# Patient Record
Sex: Female | Born: 1950 | Race: White | Hispanic: No | Marital: Single | Smoking: Current every day smoker
Health system: Southern US, Community
[De-identification: ages and names within clinical notes are randomized; demographics above are authoritative.]

## PROBLEM LIST (undated history)

## (undated) DIAGNOSIS — F32A Depression, unspecified: Secondary | ICD-10-CM

## (undated) DIAGNOSIS — R202 Paresthesia of skin: Secondary | ICD-10-CM

## (undated) DIAGNOSIS — I499 Cardiac arrhythmia, unspecified: Secondary | ICD-10-CM

## (undated) DIAGNOSIS — E785 Hyperlipidemia, unspecified: Secondary | ICD-10-CM

## (undated) DIAGNOSIS — M1990 Unspecified osteoarthritis, unspecified site: Secondary | ICD-10-CM

## (undated) DIAGNOSIS — I4891 Unspecified atrial fibrillation: Secondary | ICD-10-CM

## (undated) DIAGNOSIS — J189 Pneumonia, unspecified organism: Secondary | ICD-10-CM

## (undated) DIAGNOSIS — F329 Major depressive disorder, single episode, unspecified: Secondary | ICD-10-CM

## (undated) DIAGNOSIS — I2699 Other pulmonary embolism without acute cor pulmonale: Secondary | ICD-10-CM

## (undated) DIAGNOSIS — I10 Essential (primary) hypertension: Secondary | ICD-10-CM

## (undated) DIAGNOSIS — R0600 Dyspnea, unspecified: Secondary | ICD-10-CM

## (undated) DIAGNOSIS — R200 Anesthesia of skin: Secondary | ICD-10-CM

## (undated) DIAGNOSIS — J449 Chronic obstructive pulmonary disease, unspecified: Secondary | ICD-10-CM

## (undated) DIAGNOSIS — T884XXA Failed or difficult intubation, initial encounter: Secondary | ICD-10-CM

## (undated) DIAGNOSIS — C801 Malignant (primary) neoplasm, unspecified: Secondary | ICD-10-CM

## (undated) DIAGNOSIS — K219 Gastro-esophageal reflux disease without esophagitis: Secondary | ICD-10-CM

## (undated) HISTORY — DX: Depression, unspecified: F32.A

## (undated) HISTORY — DX: Anesthesia of skin: R20.0

## (undated) HISTORY — DX: Other pulmonary embolism without acute cor pulmonale: I26.99

## (undated) HISTORY — DX: Anesthesia of skin: R20.2

## (undated) HISTORY — PX: CYST EXCISION: SHX5701

## (undated) HISTORY — DX: Hyperlipidemia, unspecified: E78.5

## (undated) HISTORY — DX: Major depressive disorder, single episode, unspecified: F32.9

## (undated) HISTORY — PX: CERVIX LESION DESTRUCTION: SHX591

---

## 1971-03-08 DIAGNOSIS — I2699 Other pulmonary embolism without acute cor pulmonale: Secondary | ICD-10-CM

## 1971-03-08 HISTORY — DX: Other pulmonary embolism without acute cor pulmonale: I26.99

## 2000-03-07 HISTORY — PX: MASS EXCISION: SHX2000

## 2000-09-15 ENCOUNTER — Ambulatory Visit (HOSPITAL_BASED_OUTPATIENT_CLINIC_OR_DEPARTMENT_OTHER): Admission: RE | Admit: 2000-09-15 | Discharge: 2000-09-15 | Payer: Self-pay | Admitting: Otolaryngology

## 2014-09-15 ENCOUNTER — Other Ambulatory Visit (HOSPITAL_COMMUNITY): Payer: Self-pay | Admitting: Family Medicine

## 2014-09-15 DIAGNOSIS — Z1231 Encounter for screening mammogram for malignant neoplasm of breast: Secondary | ICD-10-CM

## 2014-10-06 ENCOUNTER — Ambulatory Visit (HOSPITAL_COMMUNITY): Payer: Self-pay

## 2015-01-16 NOTE — H&P (Signed)
  NTS SOAP Note  Vital Signs:  Vitals as of: XX123456: Systolic 123XX123: Diastolic 123456: Heart Rate 80: Temp 65F: Height 30ft 6in: Weight 186Lbs 0 Ounces: Pain Level 2: BMI 30.02  BMI : 30.02 kg/m2  Subjective: This 64 year old female presents for of a recurrent cyst in the left arm.  Was removed in the past in another office, has since recurred.  Review of Symptoms:  Constitutional:unremarkable   headache Eyes:unremarkable   Nose/Mouth/Throat:unremarkable Cardiovascular:  unremarkable Respiratory:wheezing, cough Gastrointestinheartburn Genitourinary:unremarkable   joint, neck, and back pain Hematolgic/Lymphatic:unremarkable   Allergic/Immunologic:unremarkable   Past Medical History:  Reviewed  Past Medical History  Surgical History:  cyst excision left arm in past Medical Problems: HTN, high cholesterol Allergies: nkda Medications: lisinopril, lovastatin, effexor   Social History:Reviewed  Social History  Preferred Language: English Race:  White Ethnicity: Not Hispanic / Latino Age: 64 year Marital Status:  M Alcohol: unknown   Smoking Status: Light tobacco smoker reviewed on 01/15/2015 Started Date:  Packs per week:  Functional Status reviewed on 01/15/2015 ------------------------------------------------ Bathing: Normal Cooking: Normal Dressing: Normal Driving: Normal Eating: Normal Managing Meds: Normal Oral Care: Normal Shopping: Normal Toileting: Normal Transferring: Normal Walking: Normal Cognitive Status reviewed on 01/15/2015 ------------------------------------------------ Attention: Normal Decision Making: Normal Language: Normal Memory: Normal Motor: Normal Perception: Normal Problem Solving: Normal Visual and Spatial: Normal   Family History:Reviewed  Family Health History Family History is Unknown    Objective Information: General:Well appearing, well nourished in no distress. 3cm tender, erythematous cyst in  the left arm. Heart:RRR, no murmur Lungs:  CTA bilaterally, no wheezes, rhonchi, rales.  Breathing unlabored.  Assessment:inflamed sebaceous cyst, left arm  Diagnoses: 706.2  L72.3 Epidermoid cyst of skin (Sebaceous cyst)  Procedures: [id number] - OFFICE OUTPATIENT NEW 20 MINUTES    Plan:  Scheduled for excision of cyst, left arm on 01/21/15.   Patient Education:Alternative treatments to surgery were discussed with patient (and family).  Risks and benefits  of procedure were fully explained to the patient (and family) who gave informed consent. Patient/family questions were addressed.  Follow-up:Pending Surgery

## 2015-01-16 NOTE — Patient Instructions (Signed)
Laura Mcgee  01/16/2015     @PREFPERIOPPHARMACY @   Your procedure is scheduled on  01/21/2015  Report to [facility] at  730  A.M.  Call this number if you have problems the morning of surgery:  [phone]   Remember:  Do not eat food or drink liquids after midnight.  Take these medicines the morning of surgery with A SIP OF WATER  Dexilant, hydrocodone, lisinopril, zantac, effexor.   Do not wear jewelry, make-up or nail polish.  Do not wear lotions, powders, or perfumes.  You may wear deodorant.  Do not shave 48 hours prior to surgery.  Men may shave face and neck.  Do not bring valuables to the hospital.  [facility] is not responsible for any belongings or valuables.  Contacts, dentures or bridgework may not be worn into surgery.  Leave your suitcase in the car.  After surgery it may be brought to your room.  For patients admitted to the hospital, discharge time will be determined by your treatment team.  Patients discharged the day of surgery will not be allowed to drive home.   Name and phone number of your driver:   family Special instructions:  none  Please read over the following fact sheets that you were given. Pain Booklet, Coughing and Deep Breathing, Surgical Site Infection Prevention, Anesthesia Post-op Instructions and Care and Recovery After Surgery      Sebaceous Cyst Removal Sebaceous cyst removal is a procedure to remove a sac of oily material that forms under your skin (sebaceous cyst). Sebaceous cysts may also be called epidermoid cysts or keratin cysts. Normally, the skin secretes this oily material through a gland or a hair follicle. This type of cyst usually results when a skin gland or hair follicle becomes blocked. You may need this procedure if you have a sebaceous cyst that becomes large, uncomfortable, or infected. LET [facility] CARE PROVIDER KNOW ABOUT:  Any allergies you have.  All medicines you are taking, including vitamins,  herbs, eye drops, creams, and over-the-counter medicines.  Previous problems you or members of your family have had with the use of anesthetics.  Any blood disorders you have.  Previous surgeries you have had.  Medical conditions you have. RISKS AND COMPLICATIONS Generally, this is a safe procedure. However, problems may occur, including:  Developing another cyst.  Bleeding.  Infection.  Scarring. BEFORE THE PROCEDURE  Ask your health care provider about:  Changing or stopping your regular medicines. This is especially important if you are taking diabetes medicines or blood thinners.  Taking medicines such as aspirin and ibuprofen. These medicines can thin your blood. Do not take these medicines before your procedure if your health care provider instructs you not to.  If you have an infected cyst, you may have to take antibiotic medicines before or after the cyst removal. Take your antibiotics as directed by your health care provider. Finish all of the medicine even if you start to feel better.  Take a shower on the morning of your procedure. Your health care provider may ask you to use a germ-killing (antiseptic) soap. PROCEDURE  You will be given a medicine that numbs the area (local anesthetic).  The skin around the cyst will be cleaned with a germ-killing solution (antiseptic).  Your health care provider will make a small surgical incision over the cyst.  The cyst will be separated from the surrounding tissues that are under your skin.  If possible, the  cyst will be removed undamaged (intact).  If the cyst bursts (ruptures), it will need to be removed in pieces.  After the cyst is removed, your health care provider will control any bleeding and close the incision with small stitches (sutures). Small incisions may not need sutures, and the bleeding will be controlled by applying direct pressure with gauze.  Your health care provider may apply antibiotic ointment and a  light bandage (dressing) over the incision. This procedure may vary among health care providers and hospitals. AFTER THE PROCEDURE  If your cyst ruptured during surgery, you may need to take antibiotic medicine. If you were prescribed an antibiotic medicine, finish all of it even if you start to feel better.   This information is not intended to replace advice given to you by your health care provider. Make sure you discuss any questions you have with your health care provider.   Document Released: 02/19/2000 Document Revised: 03/14/2014 Document Reviewed: 11/06/2013 Elsevier Interactive Patient Education 2016 Elsevier Inc[employer] PATIENT INSTRUCTIONS POST-ANESTHESIA  IMMEDIATELY FOLLOWING SURGERY:  Do not drive or operate machinery for the first twenty four hours after surgery.  Do not make any important decisions for twenty four hours after surgery or while taking narcotic pain medications or sedatives.  If you develop intractable nausea and vomiting or a severe headache please notify your doctor immediately.  FOLLOW-UP:  Please make an appointment with your surgeon as instructed. You do not need to follow up with anesthesia unless specifically instructed to do so.  WOUND CARE INSTRUCTIONS (if applicable):  Keep a dry clean dressing on the anesthesia/puncture wound site if there is drainage.  Once the wound has quit draining you may leave it open to air.  Generally you should leave the bandage intact for twenty four hours unless there is drainage.  If the epidural site drains for more than 36-48 hours please call the anesthesia department.  QUESTIONS?:  Please feel free to call your physician or the hospital operator if you have any questions, and they will be happy to assist you.

## 2015-01-19 ENCOUNTER — Encounter (HOSPITAL_COMMUNITY)
Admission: RE | Admit: 2015-01-19 | Discharge: 2015-01-19 | Disposition: A | Discharge status: Home / Self Care | Payer: Medicare Other | Source: Ambulatory Visit | Attending: General Surgery | Admitting: General Surgery

## 2015-01-19 ENCOUNTER — Encounter (HOSPITAL_COMMUNITY): Payer: Self-pay

## 2015-01-19 ENCOUNTER — Other Ambulatory Visit: Payer: Self-pay

## 2015-01-19 DIAGNOSIS — F17210 Nicotine dependence, cigarettes, uncomplicated: Secondary | ICD-10-CM | POA: Diagnosis not present

## 2015-01-19 DIAGNOSIS — L723 Sebaceous cyst: Secondary | ICD-10-CM | POA: Diagnosis present

## 2015-01-19 DIAGNOSIS — L720 Epidermal cyst: Secondary | ICD-10-CM | POA: Diagnosis not present

## 2015-01-19 DIAGNOSIS — K219 Gastro-esophageal reflux disease without esophagitis: Secondary | ICD-10-CM | POA: Diagnosis not present

## 2015-01-19 DIAGNOSIS — I10 Essential (primary) hypertension: Secondary | ICD-10-CM | POA: Diagnosis not present

## 2015-01-19 DIAGNOSIS — E7800 Pure hypercholesterolemia, unspecified: Secondary | ICD-10-CM | POA: Diagnosis not present

## 2015-01-19 HISTORY — DX: Gastro-esophageal reflux disease without esophagitis: K21.9

## 2015-01-19 HISTORY — DX: Failed or difficult intubation, initial encounter: T88.4XXA

## 2015-01-19 HISTORY — DX: Essential (primary) hypertension: I10

## 2015-01-19 LAB — BASIC METABOLIC PANEL
Anion gap: 14 (ref 5–15)
BUN: 22 mg/dL — AB (ref 6–20)
CHLORIDE: 98 mmol/L — AB (ref 101–111)
CO2: 21 mmol/L — AB (ref 22–32)
CREATININE: 0.9 mg/dL (ref 0.44–1.00)
Calcium: 9.5 mg/dL (ref 8.9–10.3)
GFR calc non Af Amer: >60 mL/min (ref >60)
Glucose, Bld: 83 mg/dL (ref 65–99)
POTASSIUM: 4 mmol/L (ref 3.5–5.1)
Sodium: 133 mmol/L — ABNORMAL LOW (ref 135–145)

## 2015-01-19 LAB — CBC WITH DIFFERENTIAL/PLATELET
Basophils Absolute: 0.1 10*3/uL (ref 0.0–0.1)
Basophils Relative: 1 %
EOS ABS: 0.1 10*3/uL (ref 0.0–0.7)
Eosinophils Relative: 2 %
HEMATOCRIT: 40.8 % (ref 36.0–46.0)
HEMOGLOBIN: 14.3 g/dL (ref 12.0–15.0)
LYMPHS ABS: 2.6 10*3/uL (ref 0.7–4.0)
LYMPHS PCT: 31 %
MCH: 33.3 pg (ref 26.0–34.0)
MCHC: 35 g/dL (ref 30.0–36.0)
MCV: 94.9 fL (ref 78.0–100.0)
MONOS PCT: 12 %
Monocytes Absolute: 1 10*3/uL (ref 0.1–1.0)
NEUTROS PCT: 54 %
Neutro Abs: 4.6 10*3/uL (ref 1.7–7.7)
Platelets: 262 10*3/uL (ref 150–400)
RBC: 4.3 MIL/uL (ref 3.87–5.11)
RDW: 14.4 % (ref 11.5–15.5)
WBC: 8.4 10*3/uL (ref 4.0–10.5)

## 2015-01-21 ENCOUNTER — Ambulatory Visit (HOSPITAL_COMMUNITY)
Admission: RE | Admit: 2015-01-21 | Discharge: 2015-01-21 | Disposition: A | Discharge status: Home / Self Care | Payer: Medicare Other | Source: Ambulatory Visit | Attending: General Surgery | Admitting: General Surgery

## 2015-01-21 ENCOUNTER — Encounter (HOSPITAL_COMMUNITY): Payer: Self-pay | Admitting: *Deleted

## 2015-01-21 ENCOUNTER — Ambulatory Visit (HOSPITAL_COMMUNITY): Payer: Medicare Other | Admitting: Anesthesiology

## 2015-01-21 ENCOUNTER — Encounter (HOSPITAL_COMMUNITY)
Admission: RE | Disposition: A | Discharge status: Home / Self Care | Payer: Self-pay | Source: Ambulatory Visit | Attending: General Surgery

## 2015-01-21 DIAGNOSIS — L720 Epidermal cyst: Secondary | ICD-10-CM | POA: Diagnosis not present

## 2015-01-21 DIAGNOSIS — F17210 Nicotine dependence, cigarettes, uncomplicated: Secondary | ICD-10-CM | POA: Diagnosis not present

## 2015-01-21 DIAGNOSIS — E7800 Pure hypercholesterolemia, unspecified: Secondary | ICD-10-CM | POA: Insufficient documentation

## 2015-01-21 DIAGNOSIS — I10 Essential (primary) hypertension: Secondary | ICD-10-CM | POA: Insufficient documentation

## 2015-01-21 DIAGNOSIS — K219 Gastro-esophageal reflux disease without esophagitis: Secondary | ICD-10-CM | POA: Diagnosis not present

## 2015-01-21 HISTORY — PX: MASS EXCISION: SHX2000

## 2015-01-21 SURGERY — EXCISION MASS
Anesthesia: General | Site: Arm Upper | Laterality: Left

## 2015-01-21 MED ORDER — BUPIVACAINE HCL (PF) 0.5 % IJ SOLN
INTRAMUSCULAR | Status: AC
  Filled 2015-01-21: qty 30

## 2015-01-21 MED ORDER — GLYCOPYRROLATE 0.2 MG/ML IJ SOLN
0.2000 mg | Freq: Once | INTRAMUSCULAR | Status: AC
  Administered 2015-01-21: 0.2 mg via INTRAVENOUS
  Filled 2015-01-21: qty 1

## 2015-01-21 MED ORDER — LIDOCAINE HCL (PF) 1 % IJ SOLN
INTRAMUSCULAR | Status: AC
  Filled 2015-01-21: qty 5

## 2015-01-21 MED ORDER — EPHEDRINE SULFATE 50 MG/ML IJ SOLN
INTRAMUSCULAR | Status: AC
  Filled 2015-01-21: qty 1

## 2015-01-21 MED ORDER — MIDAZOLAM HCL 2 MG/2ML IJ SOLN
1.0000 mg | INTRAMUSCULAR | Status: DC | PRN
  Administered 2015-01-21 (×2): 2 mg via INTRAVENOUS
  Filled 2015-01-21 (×2): qty 2

## 2015-01-21 MED ORDER — KETOROLAC TROMETHAMINE 30 MG/ML IJ SOLN
30.0000 mg | Freq: Once | INTRAMUSCULAR | Status: AC
  Administered 2015-01-21: 30 mg via INTRAVENOUS

## 2015-01-21 MED ORDER — FENTANYL CITRATE (PF) 100 MCG/2ML IJ SOLN: 25.0000 ug | INTRAMUSCULAR | Status: DC | PRN

## 2015-01-21 MED ORDER — CHLORHEXIDINE GLUCONATE 4 % EX LIQD: 1.0000 "application " | Freq: Once | CUTANEOUS | Status: DC

## 2015-01-21 MED ORDER — LACTATED RINGERS IV SOLN
INTRAVENOUS | Status: DC
  Administered 2015-01-21 (×2): via INTRAVENOUS

## 2015-01-21 MED ORDER — SODIUM CHLORIDE 0.9 % IJ SOLN
INTRAMUSCULAR | Status: AC
  Filled 2015-01-21: qty 10

## 2015-01-21 MED ORDER — SUCCINYLCHOLINE CHLORIDE 20 MG/ML IJ SOLN
INTRAMUSCULAR | Status: DC | PRN
  Administered 2015-01-21: 140 mg via INTRAVENOUS

## 2015-01-21 MED ORDER — LIDOCAINE HCL 1 % IJ SOLN
INTRAMUSCULAR | Status: DC | PRN
  Administered 2015-01-21: 25 mg via INTRADERMAL

## 2015-01-21 MED ORDER — PROPOFOL 10 MG/ML IV BOLUS
INTRAVENOUS | Status: DC | PRN
  Administered 2015-01-21: 140 mg via INTRAVENOUS

## 2015-01-21 MED ORDER — GLYCOPYRROLATE 0.2 MG/ML IJ SOLN
INTRAMUSCULAR | Status: DC | PRN
  Administered 2015-01-21: 0.4 mg via INTRAVENOUS

## 2015-01-21 MED ORDER — ROCURONIUM BROMIDE 50 MG/5ML IV SOLN
INTRAVENOUS | Status: AC
  Filled 2015-01-21: qty 1

## 2015-01-21 MED ORDER — HYDROCODONE-ACETAMINOPHEN 10-650 MG PO TABS: 1.0000 | ORAL_TABLET | Freq: Four times a day (QID) | ORAL | Status: DC | PRN

## 2015-01-21 MED ORDER — ROCURONIUM BROMIDE 100 MG/10ML IV SOLN
INTRAVENOUS | Status: DC | PRN
  Administered 2015-01-21: 5 mg via INTRAVENOUS
  Administered 2015-01-21: 20 mg via INTRAVENOUS

## 2015-01-21 MED ORDER — CEFAZOLIN SODIUM-DEXTROSE 2-3 GM-% IV SOLR
2.0000 g | INTRAVENOUS | Status: AC
  Administered 2015-01-21: 2 g via INTRAVENOUS
  Filled 2015-01-21: qty 50

## 2015-01-21 MED ORDER — ONDANSETRON HCL 4 MG/2ML IJ SOLN: 4.0000 mg | Freq: Once | INTRAMUSCULAR | Status: DC | PRN

## 2015-01-21 MED ORDER — NEOSTIGMINE METHYLSULFATE 10 MG/10ML IV SOLN
INTRAVENOUS | Status: DC | PRN
  Administered 2015-01-21: 1 mg via INTRAVENOUS

## 2015-01-21 MED ORDER — MIDAZOLAM HCL 2 MG/2ML IJ SOLN
INTRAMUSCULAR | Status: AC
  Filled 2015-01-21: qty 2

## 2015-01-21 MED ORDER — DEXAMETHASONE SODIUM PHOSPHATE 4 MG/ML IJ SOLN
4.0000 mg | Freq: Once | INTRAMUSCULAR | Status: AC
  Administered 2015-01-21: 4 mg via INTRAVENOUS
  Filled 2015-01-21: qty 1

## 2015-01-21 MED ORDER — ONDANSETRON HCL 4 MG/2ML IJ SOLN
4.0000 mg | Freq: Once | INTRAMUSCULAR | Status: AC
  Administered 2015-01-21: 4 mg via INTRAVENOUS
  Filled 2015-01-21: qty 2

## 2015-01-21 MED ORDER — FENTANYL CITRATE (PF) 100 MCG/2ML IJ SOLN
INTRAMUSCULAR | Status: DC | PRN
  Administered 2015-01-21 (×2): 50 ug via INTRAVENOUS

## 2015-01-21 MED ORDER — 0.9 % SODIUM CHLORIDE (POUR BTL) OPTIME
TOPICAL | Status: DC | PRN
  Administered 2015-01-21: 1000 mL

## 2015-01-21 MED ORDER — MIDAZOLAM HCL 5 MG/5ML IJ SOLN
INTRAMUSCULAR | Status: DC | PRN
  Administered 2015-01-21: 2 mg via INTRAVENOUS

## 2015-01-21 MED ORDER — SUCCINYLCHOLINE CHLORIDE 20 MG/ML IJ SOLN
INTRAMUSCULAR | Status: AC
  Filled 2015-01-21: qty 1

## 2015-01-21 MED ORDER — GLYCOPYRROLATE 0.2 MG/ML IJ SOLN
INTRAMUSCULAR | Status: AC
  Filled 2015-01-21: qty 2

## 2015-01-21 MED ORDER — PROPOFOL 10 MG/ML IV BOLUS
INTRAVENOUS | Status: AC
  Filled 2015-01-21: qty 20

## 2015-01-21 MED ORDER — KETOROLAC TROMETHAMINE 30 MG/ML IJ SOLN
INTRAMUSCULAR | Status: AC
  Filled 2015-01-21: qty 1

## 2015-01-21 MED ORDER — FENTANYL CITRATE (PF) 100 MCG/2ML IJ SOLN
INTRAMUSCULAR | Status: AC
  Filled 2015-01-21: qty 2

## 2015-01-21 MED ORDER — BUPIVACAINE HCL (PF) 0.5 % IJ SOLN
INTRAMUSCULAR | Status: DC | PRN
  Administered 2015-01-21: 4 mL

## 2015-01-21 SURGICAL SUPPLY — 36 items
ADH SKN CLS APL DERMABOND .7 (GAUZE/BANDAGES/DRESSINGS) ×1
BAG HAMPER (MISCELLANEOUS) ×3 IMPLANT
CHLORAPREP W/TINT 10.5 ML (MISCELLANEOUS) ×3 IMPLANT
CLOTH BEACON ORANGE TIMEOUT ST (SAFETY) ×3 IMPLANT
COVER LIGHT HANDLE STERIS (MISCELLANEOUS) ×6 IMPLANT
DECANTER SPIKE VIAL GLASS SM (MISCELLANEOUS) ×3 IMPLANT
DERMABOND ADVANCED (GAUZE/BANDAGES/DRESSINGS) ×2
DERMABOND ADVANCED .7 DNX12 (GAUZE/BANDAGES/DRESSINGS) IMPLANT
ELECT NDL TIP 2.8 STRL (NEEDLE) IMPLANT
ELECT NEEDLE TIP 2.8 STRL (NEEDLE) IMPLANT
ELECT REM PT RETURN 9FT ADLT (ELECTROSURGICAL) ×3
ELECTRODE REM PT RTRN 9FT ADLT (ELECTROSURGICAL) ×1 IMPLANT
FORMALIN 10 PREFIL 120ML (MISCELLANEOUS) ×3 IMPLANT
GLOVE BIOGEL PI IND STRL 7.0 (GLOVE) IMPLANT
GLOVE BIOGEL PI INDICATOR 7.0 (GLOVE) ×2
GLOVE ECLIPSE 6.5 STRL STRAW (GLOVE) ×2 IMPLANT
GLOVE EXAM NITRILE MD LF STRL (GLOVE) ×2 IMPLANT
GLOVE SURG SS PI 7.5 STRL IVOR (GLOVE) ×3 IMPLANT
GOWN STRL REUS W/ TWL XL LVL3 (GOWN DISPOSABLE) ×1 IMPLANT
GOWN STRL REUS W/TWL LRG LVL3 (GOWN DISPOSABLE) ×3 IMPLANT
GOWN STRL REUS W/TWL XL LVL3 (GOWN DISPOSABLE) ×3
KIT ROOM TURNOVER APOR (KITS) ×3 IMPLANT
LIQUID BAND (GAUZE/BANDAGES/DRESSINGS) IMPLANT
MANIFOLD NEPTUNE II (INSTRUMENTS) ×3 IMPLANT
NDL HYPO 25X1 1.5 SAFETY (NEEDLE) ×1 IMPLANT
NEEDLE HYPO 25X1 1.5 SAFETY (NEEDLE) ×3 IMPLANT
NS IRRIG 1000ML POUR BTL (IV SOLUTION) ×3 IMPLANT
PACK MINOR (CUSTOM PROCEDURE TRAY) IMPLANT
PAD ARMBOARD 7.5X6 YLW CONV (MISCELLANEOUS) ×3 IMPLANT
SET BASIN LINEN APH (SET/KITS/TRAYS/PACK) ×3 IMPLANT
SUT ETHILON 3 0 FSL (SUTURE) IMPLANT
SUT PROLENE 4 0 PS 2 18 (SUTURE) IMPLANT
SUT VIC AB 3-0 SH 27 (SUTURE) ×3
SUT VIC AB 3-0 SH 27X BRD (SUTURE) IMPLANT
SUT VIC AB 4-0 PS2 27 (SUTURE) IMPLANT
SYR CONTROL 10ML LL (SYRINGE) ×3 IMPLANT

## 2015-01-21 NOTE — Anesthesia Preprocedure Evaluation (Addendum)
Anesthesia Evaluation  Patient identified by MRN, date of birth, ID band Patient awake    Reviewed: Allergy & Precautions, NPO status , Patient's Chart, lab work & pertinent test results  History of Anesthesia Complications (+) DIFFICULT AIRWAY and history of anesthetic complications (larygospasms often from GERD)  Airway Mallampati: II  TM Distance: >3 FB     Dental  (+) Edentulous Upper, Partial Lower   Pulmonary Current Smoker,    breath sounds clear to auscultation       Cardiovascular hypertension, Pt. 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.)  Poorly Controlled,  Endo/Other    Renal/GU      Musculoskeletal   Abdominal   Peds  Hematology   Anesthesia Other Findings   Reproductive/Obstetrics                            Anesthesia Physical Anesthesia Plan  ASA: II  Anesthesia Plan: General   Post-op Pain Management:    Induction: Intravenous, Rapid sequence and Cricoid pressure planned  Airway Management Planned: Oral ETT  Additional Equipment:   Intra-op Plan:   Post-operative Plan: Extubation in OR  Informed Consent: I have reviewed the patients History and Physical, chart, labs and discussed the procedure including the risks, benefits and alternatives for the proposed anesthesia with the patient or authorized representative who has indicated his/her understanding and acceptance.     Plan Discussed with:   Anesthesia Plan Comments:         Anesthesia Quick Evaluation

## 2015-01-21 NOTE — Op Note (Signed)
Patient:  Laura Mcgee  DOB:  June 09, 1950  MRN:  [id number]   Preop Diagnosis:  Recurrent sebaceous cyst, left arm  Postop Diagnosis:  Same  Procedure:  Excision of 3 cm sebaceous cyst, left arm  Surgeon:  Aviva Signs, M.D.  Anes:  Gen. endotracheal  Indications:  Patient is a 64 year old white female who has had a sebaceous cyst in her left arm removed at another facility in the past who now presents with recurrence of the cyst. It is enlarging in size and tender to touch. The risks and benefits of the procedure including bleeding, infection, and recurrence of the cyst were fully explained to the patient, who gave informed consent.  Procedure note:  The patient was placed in the supine position. After induction of general endotracheal anesthesia, the left arm was prepped and draped using the usual sterile technique with DuraPrep. Surgical site confirmation was performed.  The 3 cm sebaceous cyst was excised in total without difficulty. The wound was irrigated normal saline. 0.5% Sensorcaine was instilled the surrounding wound. The skin was reapproximated using a 4-0 Vicryl subcuticular suture. Dermabond was applied.  All tape and needle counts were correct at the end of the procedure. The patient was extubated in the operating room and transferred to PACU in stable condition.  Complications:  None  EBL:  Minimal  Specimen:  Sebaceous cyst, left arm

## 2015-01-21 NOTE — Interval H&P Note (Signed)
History and Physical Interval Note:  01/21/2015 8:45 AM  Laura Mcgee  has presented today for surgery, with the diagnosis of sebaceous cyst  The various methods of treatment have been discussed with the patient and family. After consideration of risks, benefits and other options for treatment, the patient has consented to  Procedure(s): EXCISION CYST 3CM LEFT UPPER ARM (Left) as a surgical intervention .  The patient's history has been reviewed, patient examined, no change in status, stable for surgery.  I have reviewed the patient's chart and labs.  Questions were answered to the patient's satisfaction.     Aviva Signs A

## 2015-01-21 NOTE — Anesthesia Procedure Notes (Signed)
Procedure Name: Intubation Date/Time: 01/21/2015 9:36 AM Performed by: Charmaine Downs Pre-anesthesia Checklist: Patient identified, Emergency Drugs available, Suction available and Patient being monitored Patient Re-evaluated:Patient Re-evaluated prior to inductionOxygen Delivery Method: Circle system utilized Preoxygenation: Pre-oxygenation with 100% oxygen Intubation Type: IV induction, Rapid sequence and Cricoid Pressure applied Laryngoscope Size: Mac and 3 Grade View: Grade I Tube type: Oral Tube size: 7.0 mm Number of attempts: 1 Airway Equipment and Method: Stylet and Oral airway Placement Confirmation: ETT inserted through vocal cords under direct vision,  breath sounds checked- equal and bilateral and positive ETCO2 Secured at: 22 cm Tube secured with: Tape Dental Injury: Teeth and Oropharynx as per pre-operative assessment  Difficulty Due To: Difficulty was anticipated Future Recommendations: Recommend- induction with short-acting agent, and alternative techniques readily available Comments: Difficulty anticipated due to past history of "laryngospasm" per patient.

## 2015-01-21 NOTE — Transfer of Care (Signed)
Immediate Anesthesia Transfer of Care Note  Patient: Laura Mcgee  Procedure(s) Performed: Procedure(s): EXCISION CYST LEFT UPPER ARM (Left)  Patient Location: PACU  Anesthesia Type:General  Level of Consciousness: awake, alert  and patient cooperative  Airway & Oxygen Therapy: Patient Spontanous Breathing and Patient connected to face mask oxygen  Post-op Assessment: Report given to RN, Post -op Vital signs reviewed and stable and Patient moving all extremities  Post vital signs: Reviewed and stable  Last Vitals:  Filed Vitals:   01/21/15 0840  BP: 136/76  Pulse:   Temp:   Resp: 16    Complications: No apparent anesthesia complications

## 2015-01-21 NOTE — Anesthesia Postprocedure Evaluation (Signed)
  Anesthesia Post-op Note  Patient: Laura Mcgee  Procedure(s) Performed: Procedure(s): EXCISION CYST LEFT UPPER ARM (Left)  Patient Location: PACU  Anesthesia Type:General  Level of Consciousness: awake, alert , oriented and patient cooperative  Airway and Oxygen Therapy: Patient Spontanous Breathing  Post-op Pain: 2 /10, mild  Post-op Assessment: Post-op Vital signs reviewed, Patient's Cardiovascular Status Stable, Respiratory Function Stable, Patent Airway, No signs of Nausea or vomiting and Pain level controlled              Post-op Vital Signs: Reviewed and stable  Last Vitals:  Filed Vitals:   01/21/15 0840  BP: 136/76  Pulse:   Temp:   Resp: 16    Complications: No apparent anesthesia complications

## 2015-01-21 NOTE — Discharge Instructions (Signed)
Sebaceous Cyst Removal, Care After °Refer to this sheet in the next few weeks. These instructions provide you with information about caring for yourself after your procedure. Your health care provider may also give you more specific instructions. Your treatment has been planned according to current medical practices, but problems sometimes occur. Call your health care provider if you have any problems or questions after your procedure. °WHAT TO EXPECT AFTER THE PROCEDURE °After your procedure, it is common to have: °· Soreness in the area where your cyst was removed. °· Tightness or itching from your skin sutures. °HOME CARE INSTRUCTIONS °· Take medicines only as directed by your health care provider. °· If you were prescribed an antibiotic medicine, finish all of it even if you start to feel better. °· Use antibiotic ointment as directed by your health care provider. Follow the instructions carefully. °· There are many different ways to close and cover an incision, including stitches (sutures), skin glue, and adhesive strips. Follow your health care provider's instructions about: °¨ Incision care. °¨ Bandage (dressing) changes and removal. °¨ Incision closure removal. °· Keep the bandage (dressing) dry until your health care provider says that it can be removed. Take sponge baths only. Ask your health care provider when you can start showering or taking a bath. °· After your dressing is off, check your incision every day for signs of infection. Watch for: °¨ Redness, swelling, or pain. °¨ Fluid, blood, or pus. °· You can return to your normal activities. Do not do anything that stretches or puts pressure on your incision. °· You can return to your normal diet. °· Keep all follow-up visits as directed by your health care provider. This is important. °SEEK MEDICAL CARE IF: °· You have a fever. °· Your incision bleeds. °· You have redness, swelling, or pain in the incision area. °· You have fluid, blood, or pus coming  from your incision. °· Your cyst comes back after surgery. °  °This information is not intended to replace advice given to you by your health care provider. Make sure you discuss any questions you have with your health care provider. °  °Document Released: 03/14/2014 Document Reviewed: 03/14/2014 °Elsevier Interactive Patient Education ©2016 Elsevier Inc. ° °

## 2015-01-22 ENCOUNTER — Encounter (HOSPITAL_COMMUNITY): Payer: Self-pay | Admitting: General Surgery

## 2015-03-16 ENCOUNTER — Encounter: Payer: Self-pay | Admitting: Gastroenterology

## 2015-03-30 ENCOUNTER — Encounter: Payer: Self-pay | Admitting: Nurse Practitioner

## 2015-03-30 ENCOUNTER — Other Ambulatory Visit: Payer: Self-pay

## 2015-03-30 ENCOUNTER — Ambulatory Visit (INDEPENDENT_AMBULATORY_CARE_PROVIDER_SITE_OTHER): Payer: Medicare Other | Admitting: Nurse Practitioner

## 2015-03-30 VITALS — BP 148/94 | HR 88 | Temp 98.3°F | Ht 67.0 in | Wt 179.6 lb

## 2015-03-30 DIAGNOSIS — R1310 Dysphagia, unspecified: Secondary | ICD-10-CM | POA: Diagnosis not present

## 2015-03-30 DIAGNOSIS — K219 Gastro-esophageal reflux disease without esophagitis: Secondary | ICD-10-CM | POA: Diagnosis not present

## 2015-03-30 DIAGNOSIS — Z1211 Encounter for screening for malignant neoplasm of colon: Secondary | ICD-10-CM | POA: Diagnosis not present

## 2015-03-30 DIAGNOSIS — Z01810 Encounter for preprocedural cardiovascular examination: Secondary | ICD-10-CM | POA: Insufficient documentation

## 2015-03-30 MED ORDER — NA SULFATE-K SULFATE-MG SULF 17.5-3.13-1.6 GM/177ML PO SOLN: 1.0000 | Freq: Once | ORAL | Status: DC

## 2015-03-30 NOTE — Assessment & Plan Note (Signed)
Patient states she is never had a colonoscopy before. At age 65 she is well overdue for initial screening colonoscopy. Given the need for upper endoscopy at this time we will simply add a colonoscopy to her endoscopy. Other than persistent GERD symptoms and dysphagia, she is generally asymptomatic from a GI standpoint. Return for follow-up in 2 months.

## 2015-03-30 NOTE — Assessment & Plan Note (Signed)
Agent with dysphagia and assuming to her recurrent GERD. States his symptoms are worse with bread and rice, will sometimes pass and sometimes she has to regurgitate. We will add possible dilation onto her endoscopy as noted below. A turn for follow-up in 2 months.

## 2015-03-30 NOTE — Progress Notes (Signed)
CC'ED TO PCP 

## 2015-03-30 NOTE — Progress Notes (Signed)
Primary Care Physician:  Robert Bellow, MD Primary Gastroenterologist:  Dr. Oneida Alar  Chief Complaint  Patient presents with  . Gastroesophageal Reflux    HPI:   65 year old female presents on referral from PCP for worsening acid reflux and laryngeal spasm currently on Dexilant. PCP notes reviewed. Patient last saw her primary care provider on 03/11/2015 at which point she discussed the above symptoms. She was subsequently referred to [place] for further workup with a notation that she may eventually need an ENT referral as well.  Today she states she began having GERD in 2001 and was on PPI with improvement, began having worsening symptoms and went through multiple PPIs including . Started taking Coral calcium with improvement and was able to come off her PPI. She ran out and couldn't find any more in 2012. In 2012 symptoms returned "with a vengeance" and included laryngeal spasms. At that point she saw the health department, started Aciphez with no improvement and went through "all the other medications." Zantac did help but returned eventually. Started on on Dexilant and had improvement over the past 2 years. However, she is starting to have recurrent symptoms. Is currently taking Dexilant daily and Zantac two a day. Thinks her GERD is sometimes exacerbated by sinus drainage due to persistent allergies. Currently having breakthrough symptoms about 1-2 times a week. Will sometimes wake in the middle of the night coughing and choking. Also with occasional esophageal burning, throat irritation, bitter taste. Also having laryngeal spasms but unable to describe frequency. Also with frequent nausea. Patient also states she's having difficulty swallowing, worse with breads and rice. Occasionally regurgitates the stuck food. Denies abdominal pain, vomiting, hematochezia, melena. Denies chest pain, dyspnea, dizziness, lightheadedness, syncope, near syncope. Denies any other upper or lower GI symptoms.    Has never had a colonoscopy before. Takes hydrocodone once daily.  Past Medical History  Diagnosis Date  . Difficult intubation     Patient has had history of laryngospasams  . GERD (gastroesophageal reflux disease)   . Hypertension     Past Surgical History  Procedure Laterality Date  . Cyst excision Left     Arm  . Mass excision N/A 2002    Lanyx  . Mass excision Left 01/21/2015    Procedure: EXCISION CYST LEFT UPPER ARM;  Surgeon: Aviva Signs, MD;  Location: [facility];  Service: General;  Laterality: Left;    Current Outpatient Prescriptions  Medication Sig Dispense Refill  . dexlansoprazole (DEXILANT) 60 MG capsule Take 60 mg by mouth daily.    Marland Kitchen lisinopril-hydrochlorothiazide (PRINZIDE,ZESTORETIC) 20-12.5 MG tablet Take 1 tablet by mouth 2 (two) times daily.     Marland Kitchen lovastatin (MEVACOR) 20 MG tablet Take 20 mg by mouth at bedtime.    . ranitidine (ZANTAC) 75 MG tablet Take 150 mg by mouth 2 (two) times daily.     Marland Kitchen venlafaxine XR (EFFEXOR-XR) 75 MG 24 hr capsule Take 75 mg by mouth daily with breakfast.    . HYDROcodone-acetaminophen (LORCET 10/650) 10-650 MG tablet Take 1 tablet by mouth every 6 (six) hours as needed for pain. (Patient not taking: Reported on 03/30/2015) 30 tablet 0   No current facility-administered medications for this visit.    Allergies as of 03/30/2015 - Review Complete 03/30/2015  Allergen Reaction Noted  . Tetracyclines & related Other (See Comments) 01/15/2015    Family History  Problem Relation Age of Onset  . Pneumonia Mother   . Aneurysm Father     Social History  Social History  . Marital Status: Single    Spouse Name: N/A  . Number of Children: N/A  . Years of Education: N/A   Occupational History  . Not on file.   Social History Main Topics  . Smoking status: Current Every Day Smoker -- 0.25 packs/day for 25 years  . Smokeless tobacco: Never Used  . Alcohol Use: Yes     Comment: remote history of marijuana use/  occ  alcohol use  . Drug Use: Yes    Special: Marijuana  . Sexual Activity: Not on file   Other Topics Concern  . Not on file   Social History Narrative    Review of Systems: 10-point ROS negative except as per HPI.    Physical Exam: BP 148/94 mmHg  Pulse 88  Temp(Src) 98.3 F (36.8 C) (Oral)  Ht 5\' 7"  (1.702 m)  Wt 179 lb 9.6 oz (81.466 kg)  BMI 28.12 kg/m2  LMP  (Approximate) General:   Alert and oriented. Pleasant and cooperative. Well-nourished and well-developed.  Head:  Normocephalic and atraumatic. Eyes:  Without icterus, sclera clear and conjunctiva pink.  Ears:  Normal auditory acuity. Cardiovascular:  S1, S2 present without murmurs appreciated. Extremities without clubbing or edema. Respiratory:  Clear to auscultation bilaterally. No wheezes, rales, or rhonchi. No distress.  Gastrointestinal:  +BS, rounded but soft, non-tender and non-distended. No HSM noted. No guarding or rebound. No masses appreciated.  Rectal:  Deferred  Skin:  Intact without significant lesions or rashes. Neurologic:  Alert and oriented x4;  grossly normal neurologically. Psych:  Alert and cooperative. Normal mood and affect. Heme/Lymph/Immune: No excessive bruising noted.    03/30/2015 9:06 AM

## 2015-03-30 NOTE — Assessment & Plan Note (Signed)
Patient has a long-standing history of GERD. She has been on and failed Prilosec, Protonix, Nexium, Tagamet, Zantac, AcipHex previously. She is currently on Dexilant and Zantac but is begun having breakthrough symptoms on this combination as well. At this point given worsening GERD on PPI we'll plan for endoscopy. She is also due for colonoscopy as noted below. We will schedule that for the same time. Return for follow-up in 2 months.  Proceed with colonoscopy and EGD +/- dilation in the OR with propofol/MAC with Dr. Oneida Alar in the near future. The risks, benefits, and alternatives have been discussed in detail with the patient. They state understanding and desire to proceed.   Patient is not on any anticoagulants, anxiolytics. She is on hydrocodone once a day for chronic pain and Effexor daily. She drinks about 24 ounces of beer a day. For these reasons we'll plan for the procedure and the OR on propofol/MAC.

## 2015-03-30 NOTE — Patient Instructions (Signed)
1. We'll schedule your procedure for you. 2. Continue taking Dexilant and Zantac as you have been. 3. Further recommendations to be based on the results of your procedures. 4. Return for follow-up in 2 months.

## 2015-04-02 NOTE — Patient Instructions (Signed)
Laura Mcgee  04/02/2015     @PREFPERIOPPHARMACY @   Your procedure is scheduled on  04/07/2015   Report to [facility] at  1145  A.M.  Call this number if you have problems the morning of surgery:  [phone]   Remember:  Do not eat food or drink liquids after midnight.  Take these medicines the morning of surgery with A SIP OF WATER  Dexilant, lorcet, lisinopril, zantac, effexor.   Do not wear jewelry, make-up or nail polish.  Do not wear lotions, powders, or perfumes.  You may wear deodorant.  Do not shave 48 hours prior to surgery.  Men may shave face and neck.  Do not bring valuables to the hospital.  [facility] is not responsible for any belongings or valuables.  Contacts, dentures or bridgework may not be worn into surgery.  Leave your suitcase in the car.  After surgery it may be brought to your room.  For patients admitted to the hospital, discharge time will be determined by your treatment team.  Patients discharged the day of surgery will not be allowed to drive home.   Name and phone number of your driver:   family Special instructions:  Follow the diet and prep instructions given to you by Dr Nona Dell office.  Please read over the following fact sheets that you were given. Pain Booklet, Coughing and Deep Breathing, Surgical Site Infection Prevention, Anesthesia Post-op Instructions and Care and Recovery After Surgery      Esophagogastroduodenoscopy Esophagogastroduodenoscopy (EGD) is a procedure that is used to examine the lining of the esophagus, stomach, and first part of the small intestine (duodenum). A long, flexible, lighted tube with a camera attached (endoscope) is inserted down the throat to view these organs. This procedure is done to detect problems or abnormalities, such as inflammation, bleeding, ulcers, or growths, in order to treat them. The procedure lasts 5-20 minutes. It is usually an outpatient procedure, but it may  need to be performed in a hospital in emergency cases. LET [facility] CARE PROVIDER KNOW ABOUT:  Any allergies you have.  All medicines you are taking, including vitamins, herbs, eye drops, creams, and over-the-counter medicines.  Previous problems you or members of your family have had with the use of anesthetics.  Any blood disorders you have.  Previous surgeries you have had.  Medical conditions you have. RISKS AND COMPLICATIONS Generally, this is a safe procedure. However, problems can occur and include:  Infection.  Bleeding.  Tearing (perforation) of the esophagus, stomach, or duodenum.  Difficulty breathing or not being able to breathe.  Excessive sweating.  Spasms of the larynx.  Slowed heartbeat.  Low blood pressure. BEFORE THE PROCEDURE  Do not eat or drink anything after midnight on the night before the procedure or as directed by your health care provider.  Do not take your regular medicines before the procedure if your health care provider asks you not to. Ask your health care provider about changing or stopping those medicines.  If you wear dentures, be prepared to remove them before the procedure.  Arrange for someone to drive you home after the procedure. PROCEDURE  A numbing medicine (local anesthetic) may be sprayed in your throat for comfort and to stop you from gagging or coughing.  You will have an IV tube inserted in a vein in your hand or arm. You will receive medicines and fluids through this tube.  You will be given a medicine to relax you (sedative).  A pain reliever will be given through the IV tube.  A mouth guard may be placed in your mouth to protect your teeth and to keep you from biting on the endoscope.  You will be asked to lie on your left side.  The endoscope will be inserted down your throat and into your esophagus, stomach, and duodenum.  Air will be put through the endoscope to allow your health care provider to clearly  view the lining of your esophagus.  The lining of your esophagus, stomach, and duodenum will be examined. During the exam, your health care provider may:  Remove tissue to be examined under a microscope (biopsy) for inflammation, infection, or other medical problems.  Remove growths.  Remove objects (foreign bodies) that are stuck.  Treat any bleeding with medicines or other devices that stop tissues from bleeding (hot cautery, clipping devices).  Widen (dilate) or stretch narrowed areas of your esophagus and stomach.  The endoscope will be withdrawn. AFTER THE PROCEDURE  You will be taken to a recovery area for observation. Your blood pressure, heart rate, breathing rate, and blood oxygen level will be monitored often until the medicines you were given have worn off.  Do not eat or drink anything until the numbing medicine has worn off and your gag reflex has returned. You may choke.  Your health care provider should be able to discuss his or her findings with you. It will take longer to discuss the test results if any biopsies were taken.   This information is not intended to replace advice given to you by your health care provider. Make sure you discuss any questions you have with your health care provider.   Document Released: 06/24/2004 Document Revised: 03/14/2014 Document Reviewed: 01/25/2012 Elsevier Interactive Patient Education 2016 [place]. Esophagogastroduodenoscopy, Care After Refer to this sheet in the next few weeks. These instructions provide you with information about caring for yourself after your procedure. Your health care provider may also give you more specific instructions. Your treatment has been planned according to current medical practices, but problems sometimes occur. Call your health care provider if you have any problems or questions after your procedure. WHAT TO EXPECT AFTER THE PROCEDURE After your procedure, it is typical to feel:  Soreness in  your throat.  Pain with swallowing.  Sick to your stomach (nauseous).  Bloated.  Dizzy.  Fatigued. HOME CARE INSTRUCTIONS  Do not eat or drink anything until the numbing medicine (local anesthetic) has worn off and your gag reflex has returned. You will know that the local anesthetic has worn off when you can swallow comfortably.  Do not drive or operate machinery until directed by your health care provider.  Take medicines only as directed by your health care provider. SEEK MEDICAL CARE IF:   You cannot stop coughing.  You are not urinating at all or less than usual. SEEK IMMEDIATE MEDICAL CARE IF:  You have difficulty swallowing.  You cannot eat or drink.  You have worsening throat or chest pain.  You have dizziness or lightheadedness or you faint.  You have nausea or vomiting.  You have chills.  You have a fever.  You have severe abdominal pain.  You have black, tarry, or bloody stools.   This information is not intended to replace advice given to you by your health care provider. Make sure you discuss any questions you have with your health care provider.  Document Released: 02/08/2012 Document Revised: 03/14/2014 Document Reviewed: 02/08/2012 Elsevier Interactive Patient Education 2016 [employer] Esophageal Dilatation Esophageal dilatation is a procedure to open a blocked or narrowed part of the esophagus. The esophagus is the long tube in your throat that carries food and liquid from your mouth to your stomach. The procedure is also called esophageal dilation.  You may need this procedure if you have a buildup of scar tissue in your esophagus that makes it difficult, painful, or even impossible to swallow. This can be caused by gastroesophageal reflux disease (GERD). In rare cases, people need this procedure because they have cancer of the esophagus or a problem with the way food moves through the esophagus. Sometimes you may need to have another dilatation  to enlarge the opening of the esophagus gradually. LET [facility] CARE PROVIDER KNOW ABOUT:   Any allergies you have.  All medicines you are taking, including vitamins, herbs, eye drops, creams, and over-the-counter medicines.  Previous problems you or members of your family have had with the use of anesthetics.  Any blood disorders you have.  Previous surgeries you have had.  Medical conditions you have.  Any antibiotic medicines you are required to take before dental procedures. RISKS AND COMPLICATIONS Generally, this is a safe procedure. However, problems can occur and include:  Bleeding from a tear in the lining of the esophagus.  A hole (perforation) in the esophagus. BEFORE THE PROCEDURE  Do not eat or drink anything after midnight on the night before the procedure or as directed by your health care provider.  Ask your health care provider about changing or stopping your regular medicines. This is especially important if you are taking diabetes medicines or blood thinners.  Plan to have someone take you home after the procedure. PROCEDURE   You will be given a medicine that makes you relaxed and sleepy (sedative).  A medicine may be sprayed or gargled to numb the back of the throat.  Your health care provider can use various instruments to do an esophageal dilatation. During the procedure, the instrument used will be placed in your mouth and passed down into your esophagus. Options include:  Simple dilators. This instrument is carefully placed in the esophagus to stretch it.  Guided wire bougies. In this method, a flexible tube (endoscope) is used to insert a wire into the esophagus. The dilator is passed over this wire to enlarge the esophagus. Then the wire is removed.  Balloon dilators. An endoscope with a small balloon at the end is passed down into the esophagus. Inflating the balloon gently stretches the esophagus and opens it up. AFTER THE PROCEDURE  Your  blood pressure, heart rate, breathing rate, and blood oxygen level will be monitored often until the medicines you were given have worn off.  Your throat may feel slightly sore and will probably still feel numb. This will improve slowly over time.  You will not be allowed to eat or drink until the throat numbness has resolved.  If this is a same-day procedure, you may be allowed to go home once you have been able to drink, urinate, and sit on the edge of the bed without nausea or dizziness.  If this is a same-day procedure, you should have a friend or family member with you for the next 24 hours after the procedure.   This information is not intended to replace advice given to you by your health care provider. Make sure you discuss any questions you have  with your health care provider.   Document Released: 04/14/2005 Document Revised: 03/14/2014 Document Reviewed: 07/03/2013 Elsevier Interactive Patient Education 2016 [employer]. Colonoscopy A colonoscopy is an exam to look at the entire large intestine (colon). This exam can help find problems such as tumors, polyps, inflammation, and areas of bleeding. The exam takes about 1 hour.  LET [facility] CARE PROVIDER KNOW ABOUT:   Any allergies you have.  All medicines you are taking, including vitamins, herbs, eye drops, creams, and over-the-counter medicines.  Previous problems you or members of your family have had with the use of anesthetics.  Any blood disorders you have.  Previous surgeries you have had.  Medical conditions you have. RISKS AND COMPLICATIONS  Generally, this is a safe procedure. However, as with any procedure, complications can occur. Possible complications include:  Bleeding.  Tearing or rupture of the colon wall.  Reaction to medicines given during the exam.  Infection (rare). BEFORE THE PROCEDURE   Ask your health care provider about changing or stopping your regular medicines.  You may be prescribed  an oral bowel prep. This involves drinking a large amount of medicated liquid, starting the day before your procedure. The liquid will cause you to have multiple loose stools until your stool is almost clear or light green. This cleans out your colon in preparation for the procedure.  Do not eat or drink anything else once you have started the bowel prep, unless your health care provider tells you it is safe to do so.  Arrange for someone to drive you home after the procedure. PROCEDURE   You will be given medicine to help you relax (sedative).  You will lie on your side with your knees bent.  A long, flexible tube with a light and camera on the end (colonoscope) will be inserted through the rectum and into the colon. The camera sends video back to a computer screen as it moves through the colon. The colonoscope also releases carbon dioxide gas to inflate the colon. This helps your health care provider see the area better.  During the exam, your health care provider may take a small tissue sample (biopsy) to be examined under a microscope if any abnormalities are found.  The exam is finished when the entire colon has been viewed. AFTER THE PROCEDURE   Do not drive for 24 hours after the exam.  You may have a small amount of blood in your stool.  You may pass moderate amounts of gas and have mild abdominal cramping or bloating. This is caused by the gas used to inflate your colon during the exam.  Ask when your test results will be ready and how you will get your results. Make sure you get your test results.   This information is not intended to replace advice given to you by your health care provider. Make sure you discuss any questions you have with your health care provider.   Document Released: 02/19/2000 Document Revised: 12/12/2012 Document Reviewed: 10/29/2012 Elsevier Interactive Patient Education 2016 [employer]vier Inc. Colonoscopy, Care After Refer to this sheet in the next few  weeks. These instructions provide you with information on caring for yourself after your procedure. Your health care provider may also give you more specific instructions. Your treatment has been planned according to current medical practices, but problems sometimes occur. Call your health care provider if you have any problems or questions after your procedure. WHAT TO EXPECT AFTER THE PROCEDURE  After your procedure, it is typical  to have the following:  A small amount of blood in your stool.  Moderate amounts of gas and mild abdominal cramping or bloating. HOME CARE INSTRUCTIONS  Do not drive, operate machinery, or sign important documents for 24 hours.  You may shower and resume your regular physical activities, but move at a slower pace for the first 24 hours.  Take frequent rest periods for the first 24 hours.  Walk around or put a warm pack on your abdomen to help reduce abdominal cramping and bloating.  Drink enough fluids to keep your urine clear or pale yellow.  You may resume your normal diet as instructed by your health care provider. Avoid heavy or fried foods that are hard to digest.  Avoid drinking alcohol for 24 hours or as instructed by your health care provider.  Only take over-the-counter or prescription medicines as directed by your health care provider.  If a tissue sample (biopsy) was taken during your procedure:  Do not take aspirin or blood thinners for 7 days, or as instructed by your health care provider.  Do not drink alcohol for 7 days, or as instructed by your health care provider.  Eat soft foods for the first 24 hours. SEEK MEDICAL CARE IF: You have persistent spotting of blood in your stool 2-3 days after the procedure. SEEK IMMEDIATE MEDICAL CARE IF:  You have more than a small spotting of blood in your stool.  You pass large blood clots in your stool.  Your abdomen is swollen (distended).  You have nausea or vomiting.  You have a  fever.  You have increasing abdominal pain that is not relieved with medicine.   This information is not intended to replace advice given to you by your health care provider. Make sure you discuss any questions you have with your health care provider.   Document Released: 10/06/2003 Document Revised: 12/12/2012 Document Reviewed: 10/29/2012 Elsevier Interactive Patient Education 2016 Elsevier Inc. PATIENT INSTRUCTIONS POST-ANESTHESIA  IMMEDIATELY FOLLOWING SURGERY:  Do not drive or operate machinery for the first twenty four hours after surgery.  Do not make any important decisions for twenty four hours after surgery or while taking narcotic pain medications or sedatives.  If you develop intractable nausea and vomiting or a severe headache please notify your doctor immediately.  FOLLOW-UP:  Please make an appointment with your surgeon as instructed. You do not need to follow up with anesthesia unless specifically instructed to do so.  WOUND CARE INSTRUCTIONS (if applicable):  Keep a dry clean dressing on the anesthesia/puncture wound site if there is drainage.  Once the wound has quit draining you may leave it open to air.  Generally you should leave the bandage intact for twenty four hours unless there is drainage.  If the epidural site drains for more than 36-48 hours please call the anesthesia department.  QUESTIONS?:  Please feel free to call your physician or the hospital operator if you have any questions, and they will be happy to assist you.

## 2015-04-03 ENCOUNTER — Encounter (HOSPITAL_COMMUNITY): Payer: Self-pay

## 2015-04-03 ENCOUNTER — Encounter (HOSPITAL_COMMUNITY)
Admission: RE | Admit: 2015-04-03 | Discharge: 2015-04-03 | Disposition: A | Discharge status: Home / Self Care | Payer: Medicare Other | Source: Ambulatory Visit | Attending: Gastroenterology | Admitting: Gastroenterology

## 2015-04-03 DIAGNOSIS — I10 Essential (primary) hypertension: Secondary | ICD-10-CM | POA: Insufficient documentation

## 2015-04-03 DIAGNOSIS — R1013 Epigastric pain: Secondary | ICD-10-CM | POA: Insufficient documentation

## 2015-04-03 DIAGNOSIS — Z01812 Encounter for preprocedural laboratory examination: Secondary | ICD-10-CM | POA: Diagnosis not present

## 2015-04-03 DIAGNOSIS — Z7982 Long term (current) use of aspirin: Secondary | ICD-10-CM | POA: Insufficient documentation

## 2015-04-03 DIAGNOSIS — Z79899 Other long term (current) drug therapy: Secondary | ICD-10-CM | POA: Insufficient documentation

## 2015-04-03 DIAGNOSIS — E785 Hyperlipidemia, unspecified: Secondary | ICD-10-CM | POA: Diagnosis not present

## 2015-04-03 LAB — CBC
HCT: 41.3 % (ref 36.0–46.0)
Hemoglobin: 14.5 g/dL (ref 12.0–15.0)
MCH: 33.2 pg (ref 26.0–34.0)
MCHC: 35.1 g/dL (ref 30.0–36.0)
MCV: 94.5 fL (ref 78.0–100.0)
PLATELETS: 263 10*3/uL (ref 150–400)
RBC: 4.37 MIL/uL (ref 3.87–5.11)
RDW: 12.7 % (ref 11.5–15.5)
WBC: 8.8 10*3/uL (ref 4.0–10.5)

## 2015-04-03 LAB — BASIC METABOLIC PANEL
Anion gap: 11 (ref 5–15)
BUN: 20 mg/dL (ref 6–20)
CO2: 25 mmol/L (ref 22–32)
Calcium: 9.2 mg/dL (ref 8.9–10.3)
Chloride: 93 mmol/L — ABNORMAL LOW (ref 101–111)
Creatinine, Ser: 0.81 mg/dL (ref 0.44–1.00)
GFR calc Af Amer: >60 mL/min (ref >60)
GFR calc non Af Amer: >60 mL/min (ref >60)
Glucose, Bld: 83 mg/dL (ref 65–99)
Potassium: 4.4 mmol/L (ref 3.5–5.1)
Sodium: 129 mmol/L — ABNORMAL LOW (ref 135–145)

## 2015-04-07 ENCOUNTER — Encounter (HOSPITAL_COMMUNITY)
Admission: RE | Disposition: A | Discharge status: Home / Self Care | Payer: Self-pay | Source: Ambulatory Visit | Attending: Gastroenterology

## 2015-04-07 ENCOUNTER — Ambulatory Visit (HOSPITAL_COMMUNITY)
Admission: RE | Admit: 2015-04-07 | Discharge: 2015-04-07 | Disposition: A | Discharge status: Home / Self Care | Payer: Medicare Other | Source: Ambulatory Visit | Attending: Gastroenterology | Admitting: Gastroenterology

## 2015-04-07 ENCOUNTER — Ambulatory Visit (HOSPITAL_COMMUNITY): Payer: Medicare Other | Admitting: Anesthesiology

## 2015-04-07 ENCOUNTER — Encounter (HOSPITAL_COMMUNITY): Payer: Self-pay | Admitting: Anesthesiology

## 2015-04-07 DIAGNOSIS — R1310 Dysphagia, unspecified: Secondary | ICD-10-CM | POA: Insufficient documentation

## 2015-04-07 DIAGNOSIS — K2970 Gastritis, unspecified, without bleeding: Secondary | ICD-10-CM

## 2015-04-07 DIAGNOSIS — E785 Hyperlipidemia, unspecified: Secondary | ICD-10-CM | POA: Diagnosis not present

## 2015-04-07 DIAGNOSIS — R1013 Epigastric pain: Secondary | ICD-10-CM | POA: Insufficient documentation

## 2015-04-07 DIAGNOSIS — I10 Essential (primary) hypertension: Secondary | ICD-10-CM | POA: Diagnosis not present

## 2015-04-07 DIAGNOSIS — D124 Benign neoplasm of descending colon: Secondary | ICD-10-CM | POA: Insufficient documentation

## 2015-04-07 DIAGNOSIS — F329 Major depressive disorder, single episode, unspecified: Secondary | ICD-10-CM | POA: Diagnosis not present

## 2015-04-07 DIAGNOSIS — Q394 Esophageal web: Secondary | ICD-10-CM | POA: Diagnosis not present

## 2015-04-07 DIAGNOSIS — K222 Esophageal obstruction: Secondary | ICD-10-CM | POA: Insufficient documentation

## 2015-04-07 DIAGNOSIS — D125 Benign neoplasm of sigmoid colon: Secondary | ICD-10-CM | POA: Diagnosis not present

## 2015-04-07 DIAGNOSIS — Q438 Other specified congenital malformations of intestine: Secondary | ICD-10-CM | POA: Insufficient documentation

## 2015-04-07 DIAGNOSIS — K219 Gastro-esophageal reflux disease without esophagitis: Secondary | ICD-10-CM | POA: Insufficient documentation

## 2015-04-07 DIAGNOSIS — Z79899 Other long term (current) drug therapy: Secondary | ICD-10-CM | POA: Diagnosis not present

## 2015-04-07 DIAGNOSIS — K648 Other hemorrhoids: Secondary | ICD-10-CM | POA: Diagnosis not present

## 2015-04-07 DIAGNOSIS — F17210 Nicotine dependence, cigarettes, uncomplicated: Secondary | ICD-10-CM | POA: Insufficient documentation

## 2015-04-07 DIAGNOSIS — K2950 Unspecified chronic gastritis without bleeding: Secondary | ICD-10-CM | POA: Insufficient documentation

## 2015-04-07 DIAGNOSIS — Z1211 Encounter for screening for malignant neoplasm of colon: Secondary | ICD-10-CM | POA: Diagnosis not present

## 2015-04-07 DIAGNOSIS — K621 Rectal polyp: Secondary | ICD-10-CM | POA: Insufficient documentation

## 2015-04-07 DIAGNOSIS — D128 Benign neoplasm of rectum: Secondary | ICD-10-CM | POA: Insufficient documentation

## 2015-04-07 HISTORY — PX: ESOPHAGOGASTRODUODENOSCOPY (EGD) WITH PROPOFOL: SHX5813

## 2015-04-07 HISTORY — PX: COLONOSCOPY WITH PROPOFOL: SHX5780

## 2015-04-07 HISTORY — PX: SAVORY DILATION: SHX5439

## 2015-04-07 HISTORY — PX: POLYPECTOMY: SHX5525

## 2015-04-07 SURGERY — COLONOSCOPY WITH PROPOFOL
Anesthesia: Monitor Anesthesia Care

## 2015-04-07 MED ORDER — PROPOFOL 10 MG/ML IV BOLUS
INTRAVENOUS | Status: AC
  Filled 2015-04-07: qty 20

## 2015-04-07 MED ORDER — LACTATED RINGERS IV SOLN
INTRAVENOUS | Status: DC | PRN
  Administered 2015-04-07 (×2): via INTRAVENOUS

## 2015-04-07 MED ORDER — MIDAZOLAM HCL 2 MG/2ML IJ SOLN
1.0000 mg | INTRAMUSCULAR | Status: DC | PRN
  Administered 2015-04-07: 2 mg via INTRAVENOUS

## 2015-04-07 MED ORDER — FENTANYL CITRATE (PF) 100 MCG/2ML IJ SOLN
INTRAMUSCULAR | Status: AC
  Filled 2015-04-07: qty 2

## 2015-04-07 MED ORDER — PROPOFOL 10 MG/ML IV BOLUS
INTRAVENOUS | Status: DC | PRN
Start: 2015-04-07 — End: 2015-04-07
  Administered 2015-04-07 (×2): 10 mg via INTRAVENOUS

## 2015-04-07 MED ORDER — MIDAZOLAM HCL 2 MG/2ML IJ SOLN
INTRAMUSCULAR | Status: AC
  Filled 2015-04-07: qty 2

## 2015-04-07 MED ORDER — DEXLANSOPRAZOLE 60 MG PO CPDR: 60.0000 mg | DELAYED_RELEASE_CAPSULE | Freq: Every day | ORAL | Status: DC

## 2015-04-07 MED ORDER — FENTANYL CITRATE (PF) 100 MCG/2ML IJ SOLN: 25.0000 ug | INTRAMUSCULAR | Status: DC | PRN

## 2015-04-07 MED ORDER — ONDANSETRON HCL 4 MG/2ML IJ SOLN
4.0000 mg | Freq: Once | INTRAMUSCULAR | Status: AC
  Administered 2015-04-07: 4 mg via INTRAVENOUS

## 2015-04-07 MED ORDER — GLYCOPYRROLATE 0.2 MG/ML IJ SOLN
0.2000 mg | Freq: Once | INTRAMUSCULAR | Status: AC
  Administered 2015-04-07: 0.2 mg via INTRAVENOUS

## 2015-04-07 MED ORDER — LACTATED RINGERS IV SOLN
INTRAVENOUS | Status: DC
  Administered 2015-04-07: 13:00:00 via INTRAVENOUS

## 2015-04-07 MED ORDER — LIDOCAINE VISCOUS 2 % MT SOLN
OROMUCOSAL | Status: AC
  Filled 2015-04-07: qty 15

## 2015-04-07 MED ORDER — LIDOCAINE VISCOUS 2 % MT SOLN
15.0000 mL | Freq: Once | OROMUCOSAL | Status: AC
Start: 2015-04-07 — End: 2015-04-07
  Administered 2015-04-07: 15 mL via OROMUCOSAL

## 2015-04-07 MED ORDER — MINERAL OIL PO OIL
TOPICAL_OIL | ORAL | Status: AC
  Filled 2015-04-07: qty 30

## 2015-04-07 MED ORDER — GLYCOPYRROLATE 0.2 MG/ML IJ SOLN
INTRAMUSCULAR | Status: AC
  Filled 2015-04-07: qty 1

## 2015-04-07 MED ORDER — PROPOFOL 500 MG/50ML IV EMUL
INTRAVENOUS | Status: DC | PRN
  Administered 2015-04-07: 125 ug/kg/min via INTRAVENOUS
  Administered 2015-04-07 (×2): via INTRAVENOUS

## 2015-04-07 MED ORDER — ONDANSETRON HCL 4 MG/2ML IJ SOLN
INTRAMUSCULAR | Status: AC
  Filled 2015-04-07: qty 2

## 2015-04-07 MED ORDER — FENTANYL CITRATE (PF) 100 MCG/2ML IJ SOLN
25.0000 ug | INTRAMUSCULAR | Status: AC
  Administered 2015-04-07 (×2): 25 ug via INTRAVENOUS

## 2015-04-07 MED ORDER — ONDANSETRON HCL 4 MG/2ML IJ SOLN: 4.0000 mg | Freq: Once | INTRAMUSCULAR | Status: DC | PRN

## 2015-04-07 NOTE — Progress Notes (Signed)
REVIEWED-NO ADDITIONAL RECOMMENDATIONS. 

## 2015-04-07 NOTE — H&P (Addendum)
Primary Care Physician:  Robert Bellow, MD Primary Gastroenterologist:  Dr. Oneida Alar  Pre-Procedure History & Physical: HPI:  Laura Mcgee is a 65 y.o. female here for dyspepsia & screening.  Past Medical History  Diagnosis Date  . Difficult intubation     Patient has had history of laryngospasams  . GERD (gastroesophageal reflux disease)   . Hypertension   . Hyperlipidemia   . Depression     Past Surgical History  Procedure Laterality Date  . Cyst excision Left     Arm  . Mass excision N/A 2002    Lanyx  . Mass excision Left 01/21/2015    Procedure: EXCISION CYST LEFT UPPER ARM;  Surgeon: Aviva Signs, MD;  Location: [facility];  Service: General;  Laterality: Left;    Prior to Admission medications   Medication Sig Start Date End Date Taking? Authorizing Provider  Aspirin-Salicylamide-Caffeine (BC HEADACHE POWDER PO) Take 1 packet by mouth daily as needed (pain).   Yes Historical Provider, MD  dexlansoprazole (DEXILANT) 60 MG capsule Take 60 mg by mouth daily.   Yes Historical Provider, MD  HYDROcodone-acetaminophen (NORCO) 10-325 MG tablet Take 1-2 tablets by mouth every 6 (six) hours as needed.  03/11/15  Yes Historical Provider, MD  lisinopril-hydrochlorothiazide (PRINZIDE,ZESTORETIC) 20-12.5 MG tablet Take 1 tablet by mouth 2 (two) times daily.    Yes Historical Provider, MD  lovastatin (MEVACOR) 20 MG tablet Take 20 mg by mouth at bedtime.   Yes Historical Provider, MD  Na Sulfate-K Sulfate-Mg Sulf SOLN Take 1 kit by mouth once. 03/30/15 04/29/15 Yes Carlis Stable, NP  ranitidine (ZANTAC) 75 MG tablet Take 150 mg by mouth 2 (two) times daily.    Yes Historical Provider, MD  venlafaxine XR (EFFEXOR-XR) 75 MG 24 hr capsule Take 75 mg by mouth daily with breakfast.   Yes Historical Provider, MD  HYDROcodone-acetaminophen (LORCET 10/650) 10-650 MG tablet Take 1 tablet by mouth every 6 (six) hours as needed for pain. Patient not taking: Reported on 04/06/2015 01/21/15   Aviva Signs, MD    Allergies as of 03/30/2015 - Review Complete 03/30/2015  Allergen Reaction Noted  . Tetracyclines & related Other (See Comments) 01/15/2015    Family History  Problem Relation Age of Onset  . Pneumonia Mother   . Aneurysm Father   . Colon cancer Neg Hx     Social History   Social History  . Marital Status: Single    Spouse Name: N/A  . Number of Children: N/A  . Years of Education: N/A   Occupational History  . Not on file.   Social History Main Topics  . Smoking status: Current Every Day Smoker -- 0.25 packs/day for 25 years  . Smokeless tobacco: Never Used  . Alcohol Use: 0.0 oz/week    0 Standard drinks or equivalent per week     Comment: Drinks about 24 oz beer a night; previously a 6-pack a day./ Smokes marjuana daily  . Drug Use: Yes    Special: Marijuana     Comment: Daily  . Sexual Activity: Not on file   Other Topics Concern  . Not on file   Social History Narrative    Review of Systems: See HPI, otherwise negative ROS   Physical Exam: BP 136/92 mmHg  Pulse 85  Temp(Src) 97.5 F (36.4 C) (Oral)  Resp 16  SpO2 95%  LMP  (Approximate) General:   Alert,  pleasant and cooperative in NAD Head:  Normocephalic and atraumatic. Neck:  Supple; Lungs:  Clear throughout to auscultation.    Heart:  Regular rate and rhythm. Abdomen:  Soft, nontender and nondistended. Normal bowel sounds, without guarding, and without rebound.   Neurologic:  Alert and  oriented x4;  grossly normal neurologically.  Impression/Plan:    DYSPEPSIA/screening  PLAN:  EGD/?dil-BRAVO CAPSULE PLACEMENT/TCS TODAY. DISCUSSED PROCEDURE, BENEFITS, AND RISKS OF PROCEDURE WITH PT AND HER DAUGHTER.Marland Kitchen

## 2015-04-07 NOTE — Op Note (Signed)
[facility] [address] [place], [address]   ENDOSCOPY PROCEDURE REPORT  PATIENT: Laura Mcgee, Laura Mcgee  MR#: [id number] BIRTHDATE: 22-Feb-1951 , 62  yrs. old GENDER: female  ENDOSCOPIST: Danie Binder, MD REFERRED [id number] Karie Kirks, M.D.  PROCEDURE DATE: 2015/04/25 PROCEDURE:   EGD w/ biopsy , EGD w/ Bravo capsule placement, and EGD w/ wire guided (savary) dilation  INDICATIONS:dyspepsia.   dysphagia. MEDICATIONS: Monitored anesthesia care  TOPICAL ANESTHETIC: Viscous Xylocaine ASA CLASS:  DESCRIPTION OF PROCEDURE:     Physical exam was performed.  Informed consent was obtained from the patient after explaining the benefits, risks, and alternatives to the procedure.  The patient was connected to the monitor and placed in the left lateral position.  Continuous oxygen was provided by nasal cannula and IV medicine administered through an indwelling cannula.  After administration of sedation, the patients esophagus was intubated and the [id number]  endoscope was advanced under direct visualization to the second portion of the duodenum.  The scope was removed slowly by carefully examining the color, texture, anatomy, and integrity of the mucosa on the way out.  The patient was recovered in endoscopy and discharged home in satisfactory condition.  Estimated blood loss is zero unless otherwise noted in this procedure report.    ESOPHAGUS: A Schatzki ring was found and was widely open.  ESOPHAGUS DILATED WITH 16-17 MM DILATOR WITH MINIMAL RESISTANCE. NO HEME.  EG JUNCTION 40 CM FROM THE TEETH.BRAVO CAPSULE 34 CM FROM THE TEETH. STOMACH: Moderate non-erosive gastritis (inflammation) was found in the gastric body and gastric antrum.  Multiple biopsies were performed using cold forceps.   DUODENUM: The duodenal mucosa showed no abnormalities in the bulb and 2nd part of the duodenum.  COMPLICATIONS: There were no immediate complications.  ENDOSCOPIC  IMPRESSION: 1.   Schatzki ring 2.   BRAVO CAPSULE 34 CM FROM THE TEETH 3.   MODERATE Non-erosive gastritis  RECOMMENDATIONS: CONTINUE WEIGHT LOSS EFFORTS.  LOSE 10 LBS. TAKE DEXILANT TODAY. CONTINUE ZANTAC. RECORD ALL PO INTAKE FOR THE NEXT 48 HOURS. RETURN THE RECORDER ON Thursday AT 3 PM. AWAIT BIOPSY RESULTS. FOLLOW UP IN 4 MOS. NEXT COLONOSCOPY IN 3-5 YEARS WITH AN OVERTUBE.  REPEAT EXAM: eSigned:  Danie Binder, MD 04-25-15 9:24 PM CPT CODES: ICD CODES:  The ICD and CPT codes recommended by this software are interpretations from the data that the clinical staff has captured with the software.  The verification of the translation of this report to the ICD and CPT codes and modifiers is the sole responsibility of the health care institution and practicing physician where this report was generated.  [employer] will not be held responsible for the validity of the ICD and CPT codes included on this report.  AMA assumes no liability for data contained or not contained herein. CPT is a Designer, television/film set of the [employer].

## 2015-04-07 NOTE — Transfer of Care (Signed)
Immediate Anesthesia Transfer of Care Note  Patient: Laura Mcgee  Procedure(s) Performed: Procedure(s) with comments: COLONOSCOPY WITH PROPOFOL (N/A) - 1315 ESOPHAGOGASTRODUODENOSCOPY (EGD) WITH PROPOFOL (N/A) SAVORY DILATION (N/A) POLYPECTOMY (N/A) - Descending colon polyps x 3 Sigmoid colon polyp x 1 rectal polyps  Patient Location: PACU  Anesthesia Type:MAC  Level of Consciousness: awake  Airway & Oxygen Therapy: Patient Spontanous Breathing and Patient connected to nasal cannula oxygen  Post-op Assessment: Report given to RN  Post vital signs: Reviewed and stable  Last Vitals:  Filed Vitals:   04/07/15 1320 04/07/15 1325  BP: 129/86 142/85  Pulse:    Temp:    Resp: 23 20    Complications: No apparent anesthesia complications

## 2015-04-07 NOTE — Op Note (Signed)
[facility] [address] [place], [address]   COLONOSCOPY PROCEDURE REPORT  PATIENT: Laura Mcgee, Laura Mcgee  MR#: [id number] BIRTHDATE: June 10, 1950 , 55  yrs. old GENDER: female ENDOSCOPIST: Danie Binder, MD REFERRED [id number] Karie Kirks, M.D. PROCEDURE DATE:  05-01-2015 PROCEDURE:   Colonoscopy with cold biopsy polypectomy INDICATIONS:average risk patient for colon cancer. MEDICATIONS: Monitored anesthesia care  DESCRIPTION OF PROCEDURE:    Physical exam was performed.  Informed consent was obtained from the patient after explaining the benefits, risks, and alternatives to procedure.  The patient was connected to monitor and placed in left lateral position. Continuous oxygen was provided by nasal cannula and IV medicine administered through an indwelling cannula.  After administration of sedation and rectal exam, the patients rectum was intubated and the [id number]  colonoscope was advanced under direct visualization to the cecum.  The scope was removed slowly by carefully examining the color, texture, anatomy, and integrity mucosa on the way out.  The patient was recovered in endoscopy and discharged home in satisfactory condition. Estimated blood loss is zero unless otherwise noted in this procedure report.    COLON FINDINGS: Seven sessile polyps ranging from 2 to 73mm in size were found in the rectum, sigmoid colon, and descending colon.  A polypectomy was performed with cold forceps.  , The colon was redundant.  Manual abdominal counter-pressure was used to reach the cecum.  The patient was moved on to their back to reach the cecum, and Small internal hemorrhoids were found.  PREP QUALITY: poor IN RIGHT COLON GOOD IN LEFT COLON.  CECAL W/D TIME: 14       minutes   COMPLICATIONS: None  ENDOSCOPIC IMPRESSION: 1.   Seven COLORECTAL POLYPS REMOVED 2.   The LEFT colon IS redundant 3.   Small internal hemorrhoids  RECOMMENDATIONS: CONTINUE WEIGHT LOSS  EFFORTS.  LOSE 10 LBS. TAKE DEXILANT TODAY. CONTINUE ZANTAC. RECORD ALL PO INTAKE FOR THE NEXT 48 HOURS. RETURN THE RECORDER ON Thursday AT 3 PM. AWAIT BIOPSY RESULTS. FOLLOW UP IN 4 MOS. NEXT COLONOSCOPY IN 3-5 YEARS WITH AN OVERTUBE.    eSigned:  Danie Binder, MD 05/01/15 9:19 PM   CPT CODES: ICD CODES:  The ICD and CPT codes recommended by this software are interpretations from the data that the clinical staff has captured with the software.  The verification of the translation of this report to the ICD and CPT codes and modifiers is the sole responsibility of the health care institution and practicing physician where this report was generated.  [place]. will not be held responsible for the validity of the ICD and CPT codes included on this report.  AMA assumes no liability for data contained or not contained herein. CPT is a Designer, television/film set of the [employer].

## 2015-04-07 NOTE — Anesthesia Postprocedure Evaluation (Signed)
Anesthesia Post Note  Patient: Teodora Basic  Procedure(s) Performed: Procedure(s) (LRB): COLONOSCOPY WITH PROPOFOL (N/A) ESOPHAGOGASTRODUODENOSCOPY (EGD) WITH PROPOFOL (N/A) SAVORY DILATION (N/A) POLYPECTOMY (N/A)  Patient location during evaluation: PACU Anesthesia Type: MAC Level of consciousness: awake and alert and oriented Pain management: pain level controlled Vital Signs Assessment: post-procedure vital signs reviewed and stable Respiratory status: spontaneous breathing Cardiovascular status: blood pressure returned to baseline Postop Assessment: no signs of nausea or vomiting Anesthetic complications: no    Last Vitals:  Filed Vitals:   04/07/15 1325 04/07/15 1449  BP: 142/85 144/97  Pulse:    Temp:  36.7 C  Resp: 20     Last Pain: There were no vitals filed for this visit.               Kerstin Crusoe

## 2015-04-07 NOTE — Anesthesia Preprocedure Evaluation (Signed)
Anesthesia Evaluation  Patient identified by MRN, date of birth, ID band Patient awake    Reviewed: Allergy & Precautions, NPO status , Patient's Chart, lab work & pertinent test results  History of Anesthesia Complications (+) DIFFICULT AIRWAY and history of anesthetic complications (larygospasms often from GERD)  Airway Mallampati: II  TM Distance: >3 FB     Dental  (+) Edentulous Upper, Partial Lower   Pulmonary Current Smoker,    breath sounds clear to auscultation       Cardiovascular hypertension, Pt. 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.)  Poorly Controlled,  Endo/Other    Renal/GU      Musculoskeletal   Abdominal   Peds  Hematology   Anesthesia Other Findings   Reproductive/Obstetrics                             Anesthesia Physical Anesthesia Plan  ASA: II  Anesthesia Plan: General and MAC   Post-op Pain Management:    Induction: Intravenous  Airway Management Planned: Simple Face Mask  Additional Equipment:   Intra-op Plan:   Post-operative Plan:   Informed Consent: I have reviewed the patients History and Physical, chart, labs and discussed the procedure including the risks, benefits and alternatives for the proposed anesthesia with the patient or authorized representative who has indicated his/her understanding and acceptance.     Plan Discussed with:   Anesthesia Plan Comments:         Anesthesia Quick Evaluation

## 2015-04-07 NOTE — Discharge Instructions (Signed)
YOU HAD 7 COLON POLYPS REMOVED. I PLACED A CAPSULE IN YOUR ESOPHAGUS.  IT WILL FALL OFF & PASS IN YOUR STOOL. You have gastritis  likley due to [facility] POWDER USE. I biopsied your stomach.   CONTINUE YOUR WEIGHT LOSS EFFORTS. LOSE 10 LBS.  TAKE YOUR DEXILANT TODAY.  CONTINUE ZANTAC.  RECORD EVERYTHING THAT GOES INTO YOUR MOUTH FOR THE NEXT 48 HOURS.  RETURN THE RECORDER ON Thursday.  YOUR BIOPSY RESULTS WILL BE AVAILABLE IN MY CHART FEB 3 AND  MY OFFICE WILL CONTACT YOU IN 10-14 DAYS WITH YOUR RESULTS.   FOLLOW UP IN 4 MOS.  NEXT COLONOSCOPY IN 3-5 YEARS.  ENDOSCOPY Care After Read the instructions outlined below and refer to this sheet in the next week. These discharge instructions provide you with general information on caring for yourself after you leave the hospital. While your treatment has been planned according to the most current medical practices available, unavoidable complications occasionally occur. If you have any problems or questions after discharge, call DR. De Jaworski, [phone].  ACTIVITY  You may resume your regular activity, but move at a slower pace for the next 24 hours.   Take frequent rest periods for the next 24 hours.   Walking will help get rid of the air and reduce the bloated feeling in your belly (abdomen).   No driving for 24 hours (because of the medicine (anesthesia) used during the test).   You may shower.   Do not sign any important legal documents or operate any machinery for 24 hours (because of the anesthesia used during the test).    NUTRITION  Drink plenty of fluids.   You may resume your normal diet as instructed by your doctor.   Begin with a light meal and progress to your normal diet. Heavy or fried foods are harder to digest and may make you feel sick to your stomach (nauseated).   Avoid alcoholic beverages for 24 hours or as instructed.    MEDICATIONS  You may resume your normal medications.   WHAT YOU CAN EXPECT  TODAY  Some feelings of bloating in the abdomen.   Passage of more gas than usual.   Spotting of blood in your stool or on the toilet paper  .  IF YOU HAD POLYPS REMOVED DURING THE ENDOSCOPY:  Eat a soft diet IF YOU HAVE NAUSEA, BLOATING, ABDOMINAL PAIN, OR VOMITING.    FINDING OUT THE RESULTS OF YOUR TEST Not all test results are available during your visit. DR. Oneida Alar WILL CALL YOU WITHIN 14 DAYS OF YOUR PROCEDUE WITH YOUR RESULTS. Do not assume everything is normal if you have not heard from DR. Ketzaly Cardella, CALL HER OFFICE AT [phone].  SEEK IMMEDIATE MEDICAL ATTENTION AND CALL THE OFFICE: [phone] IF:  You have more than a spotting of blood in your stool.   Your belly is swollen (abdominal distention).   You are nauseated or vomiting.   You have a temperature over 101F.   You have abdominal pain or discomfort that is severe or gets worse throughout the day.

## 2015-04-09 ENCOUNTER — Telehealth: Payer: Self-pay | Admitting: Gastroenterology

## 2015-04-09 ENCOUNTER — Encounter (HOSPITAL_COMMUNITY): Payer: Self-pay | Admitting: Gastroenterology

## 2015-04-09 NOTE — Telephone Encounter (Signed)
Pt is aware.  

## 2015-04-09 NOTE — OR Nursing (Signed)
Patient brought [place] receiver back and stated that she was instructed to complete a diary but did not do it. Patient stated that it was too hard for her to push a button and write down things at the same time.  Walden Field, NP notified and will notify Dr. Oneida Alar.

## 2015-04-09 NOTE — Telephone Encounter (Signed)
Ov made, reminder in epic

## 2015-04-09 NOTE — Telephone Encounter (Addendum)
Please call pt. SHE had ONE simple adenoma AND SIX HYPERPLASTIC POLYPS removed. HER stomach Bx shows gastritis FROM ASA USE. HERBRAVO STUDY SHOW NON-ACID REFLUX. THE DEXILANT SHUTS DOWN HER ACID PUMPS ADEQUATELY. SHE SHOULD:  1. ADD BACLOFEN 10 MG 30 MINS PRIOR BREAKFAST AND LUNCH. IT CAN CAUSE DROWSINESS. 2. CONTINUE [place].  CONTINUE HER WEIGHT LOSS EFFORTS. LOSE 10 LBS.  ZANTAC HELPS MOST WHEN USED AS NEEDED.  FOLLOW UP IN  APR 2017 E30 GASTRITIS/GERD/DYSPHAGIA.  NEXT COLONOSCOPY IN 5 YEARS.

## 2015-04-20 ENCOUNTER — Telehealth: Payer: Self-pay | Admitting: Gastroenterology

## 2015-04-20 NOTE — Telephone Encounter (Signed)
BRAVO SHOWS NON-ACID REFLUX.

## 2015-04-20 NOTE — Procedures (Signed)
  POST-OPERATIVE DIAGNOSIS:  NON-ULCER DYSPEPSIA  PROCEDURE:  Procedure(s): BRAVO PH STUDY ON DEXILANT DAILY. PT HAD DIFFICULTY OPERATING DEVICE.  SURGEON:  Surgeon(s): Dorothyann Peng, MD  FINDINGS:  PT HAD RECORDER FOR 1 DAY AND 18 HOURS 44 MINS   FRACTION Ph TOTAL:  DAY 1 3.2  DAY 2 1.3   # OF REFLUXES:    DAY 1 19  DAY 2 29   LONG REFLUX > 5:   DAY 1 2  DAY 2 0   LONGEST REFLUX:   DAY 1 15 DAY 2 4   REFLUX TABLE: UPRIGHT 48 EPISODES  DEMEESTER SCORE DAY 1:  9.5  (NL < 14.72)  DEMEESTER SCORE DAY 2:  4.8 SAP TABLE: ACID REFLUX ANALYSIS:  LIMITED-PT HAD DIFFICULTY OPERATING DEVICE.  DIAGNOSIS: NON-ACID REFLUX  PLAN: 1. ADD BACLOFEN 10 MG 30 MINS PRIOR BREAKFAST AND LUNCH. 2. CONTINUE [place]. 3. OPV IN APR 2017.

## 2015-04-21 MED ORDER — BACLOFEN 10 MG PO TABS: ORAL_TABLET | ORAL | Status: DC

## 2015-04-21 NOTE — Telephone Encounter (Signed)
PT is aware.

## 2015-04-21 NOTE — Telephone Encounter (Signed)
Pt is aware of results and meds. Forwarding to Dr. Oneida Alar to send her Rx to pharmacy for the Baclofen.

## 2015-04-21 NOTE — Telephone Encounter (Signed)
PLEASE CALL PT. Rx FOR BACLOFEN sent.

## 2015-04-21 NOTE — Addendum Note (Signed)
Addended by: Barney Drain L on: 04/21/2015 12:12 PM   Modules accepted: Orders

## 2015-05-28 ENCOUNTER — Ambulatory Visit (INDEPENDENT_AMBULATORY_CARE_PROVIDER_SITE_OTHER): Payer: Medicare Other | Admitting: Nurse Practitioner

## 2015-05-28 ENCOUNTER — Encounter: Payer: Self-pay | Admitting: Nurse Practitioner

## 2015-05-28 VITALS — BP 120/84 | HR 80 | Temp 97.0°F | Ht 67.0 in | Wt 178.0 lb

## 2015-05-28 DIAGNOSIS — K219 Gastro-esophageal reflux disease without esophagitis: Secondary | ICD-10-CM | POA: Diagnosis not present

## 2015-05-28 MED ORDER — SUCRALFATE 1 GM/10ML PO SUSP: 1.0000 g | Freq: Four times a day (QID) | ORAL | Status: DC | PRN

## 2015-05-28 MED ORDER — BACLOFEN 10 MG PO TABS: ORAL_TABLET | ORAL | Status: DC

## 2015-05-28 NOTE — Patient Instructions (Signed)
1. I refilled your baclofen to allow enough pills to take twice a day. 2. I send in Carafate suspension to your pharmacy to take 4 times a day as needed, particularly at night. 3. If the Carafate liquid is too expensive, call our office and we can send in the tabs that you can crush and mix into a slurry with water. 4. Return for follow-up in 3 months.

## 2015-05-28 NOTE — Progress Notes (Signed)
Referring Provider: Lemmie Evens, MD Primary Care Physician:  Robert Bellow, MD Primary GI:  Dr. Oneida Alar  Chief Complaint  Patient presents with  . Gastroesophageal Reflux    HPI:   Laura Mcgee is a 65 y.o. female who presents for follow-up on GERD. Last seen in our office 03/30/2015 for GERD and dysphagia. At that point noted she tried and failed Prilosec, Protonix, Nexium, Tagamet, Zantac, AcipHex. Was having breakthrough symptoms on Dexilant and Zantac. Also with worsening dysphagia. At that point she was set up for colonoscopy and endoscopy with possible dilation. Colonoscopy completed 04/07/2015 with 7 polyps removed, recommend next colonoscopy in 3-5 years with an overtube. EGD and Bravo capsule placement performed the same day which found Schatzki's ring status post dilation, moderate nonerosive gastritis. Recommended continue Zantac, take Dexilant. Recommendations from bravo capsule include at baclofen 10 mg 30 minutes before breakfast and lunch, continue Dexilant and Venlaxafine.   Today she states she has cut back on BC powders to 1-2 a week. Stopped taking Dexilant which she states isn't doing anything and gives me side effects of confusion. States her PCP initially told her some months ago to not take PPI daily as they "found it was causing dimentia and instead to only take it every 3 days." When she began taking it daily as recommended by Dr. Oneida Alar "after 3-4 days I was having memory problems, even with things that were routine, so I stopped taking it." Currently only on Zantac. States she she was told to take Baclofen twice a day but was only given a prescription for 30. Did not call to notify us because "I couldn't find the number." Only takes at night, doesn't like it during the day because it makes her sleepy. Has symptoms intermittently, has altered her diet, try to not eat near to bedtime, sleeps with her head elevated but when she enters deep sleep she subconsciously  changes positions. Denies hematochezia, melena. Is having diarrhea due to antibiotics for sinus infection. Only 3 days left. Saw her PCP yesterday and discussed it with him. Denies chest pain, dyspnea, dizziness, lightheadedness, syncope, near syncope. Denies any other upper or lower GI symptoms.  Past Medical History  Diagnosis Date  . Difficult intubation     Patient has had history of laryngospasams  . GERD (gastroesophageal reflux disease)   . Hypertension   . Hyperlipidemia   . Depression     Past Surgical History  Procedure Laterality Date  . Cyst excision Left     Arm  . Mass excision N/A 2002    Lanyx  . Mass excision Left 01/21/2015    Procedure: EXCISION CYST LEFT UPPER ARM;  Surgeon: Aviva Signs, MD;  Location: [facility] ORS;  Service: General;  Laterality: Left;  . Colonoscopy with propofol N/A 04/07/2015    SLF: 1. seven colorectal polyps removed 2. the left colon is redundant 3. small internal hemorrhoids   . Esophagogastroduodenoscopy (egd) with propofol N/A 04/07/2015    SLF: 1. Schatzki ring 2. Bravo capsule 34 cm from the teeth 3. moderate non-erosive gastritis.   Azzie Almas dilation N/A 04/07/2015    Procedure: SAVORY DILATION;  Surgeon: Danie Binder, MD;  Location: [facility] SUITE;  Service: Endoscopy;  Laterality: N/A;  . Polypectomy N/A 04/07/2015    Procedure: POLYPECTOMY;  Surgeon: Danie Binder, MD;  Location: [facility] SUITE;  Service: Endoscopy;  Laterality: N/A;  Descending colon polyps x 3     Current Outpatient Prescriptions  Medication Sig Dispense  Refill  . baclofen (LIORESAL) 10 MG tablet 1 PO 30 MINS PRIOR TO BREAKFAST AND SUPPER. (Patient taking differently: 1 PO 30 MINS PRIOR TO BREAKFAST AND SUPPER. Pt said she is only taking once a day) 30 each 11  . HYDROcodone-acetaminophen (NORCO) 10-325 MG tablet Take 1-2 tablets by mouth every 6 (six) hours as needed.     Marland Kitchen lisinopril-hydrochlorothiazide (PRINZIDE,ZESTORETIC) 20-12.5 MG tablet Take 1 tablet by mouth 2  (two) times daily.     Marland Kitchen lovastatin (MEVACOR) 20 MG tablet Take 20 mg by mouth at bedtime.    . ranitidine (ZANTAC) 75 MG tablet Take 150 mg by mouth 2 (two) times daily.     Marland Kitchen venlafaxine XR (EFFEXOR-XR) 75 MG 24 hr capsule Take 75 mg by mouth daily with breakfast.    . Aspirin-Salicylamide-Caffeine (BC HEADACHE POWDER PO) Take 1 packet by mouth daily as needed (pain). Reported on 05/28/2015    . dexlansoprazole (DEXILANT) 60 MG capsule Take 1 capsule (60 mg total) by mouth daily. (Patient not taking: Reported on 05/28/2015) 30 capsule 11   No current facility-administered medications for this visit.    Allergies as of 05/28/2015 - Review Complete 05/28/2015  Allergen Reaction Noted  . Tetracyclines & related Other (See Comments) 01/15/2015    Family History  Problem Relation Age of Onset  . Pneumonia Mother   . Aneurysm Father   . Colon cancer Neg Hx     Social History   Social History  . Marital Status: Single    Spouse Name: N/A  . Number of Children: N/A  . Years of Education: N/A   Social History Main Topics  . Smoking status: Current Every Day Smoker -- 0.25 packs/day for 25 years  . Smokeless tobacco: Never Used     Comment: 2-3 cigarettes daily  . Alcohol Use: 0.0 oz/week    0 Standard drinks or equivalent per week     Comment: Drinks about 24 oz beer a night; previously a 6-pack a day./ Smokes marjuana daily  . Drug Use: Yes    Special: Marijuana     Comment: Daily  . Sexual Activity: Not Asked   Other Topics Concern  . None   Social History Narrative    Review of Systems: 10-point ROS negative except as per HPI.   Physical Exam: BP 120/84 mmHg  Pulse 80  Temp(Src) 97 F (36.1 C) (Oral)  Ht 5\' 7"  (1.702 m)  Wt 178 lb (80.74 kg)  BMI 27.87 kg/m2  LMP  (Approximate) General:   Alert and oriented. Pleasant and cooperative. Well-nourished and well-developed.  Eyes:  Without icterus, sclera clear and conjunctiva pink.  Ears:  Normal auditory  acuity. Cardiovascular:  S1, S2 present without murmurs appreciated. Extremities without clubbing or edema. Respiratory:  Clear to auscultation bilaterally. No wheezes, rales, or rhonchi. No distress.  Gastrointestinal:  +BS, soft, and non-distended. Minimal epigastric TTP. No HSM noted. No guarding or rebound. No masses appreciated.  Rectal:  Deferred  Neurologic:  Alert and oriented x4;  grossly normal neurologically. Psych:  Alert and cooperative. Normal mood and affect. Heme/Lymph/Immune: No excessive bruising noted.    05/28/2015 9:43 AM   Disclaimer: This note was dictated with voice recognition software. Similar sounding words can inadvertently be transcribed and may not be corrected upon review.

## 2015-05-28 NOTE — Progress Notes (Signed)
CC'ED TO PCP 

## 2015-05-28 NOTE — Assessment & Plan Note (Signed)
Continues with breakthrough GERD symptoms. Was previously told by primary care to stop taking PPI daily he has researched found it causing dementia. She began taking Dexilant every day as recommended by Dr. Oneida Alar at which point she noticed forgetfulness evening routine things, subsequently stop taking as it wasn't working much anyway. She has not tried and failed all PPIs. Is undertaken baclofen once a day because only 30 pills were written for, could not find her number to call and let us know. This point I'll refill her baclofen with 60 pills and 11 refills to allow for twice a day dosing as recommended. We'll also send an Carafate suspension to take 4 times a day as needed for symptomatic management. Return for follow-up in 3 months.

## 2015-07-16 ENCOUNTER — Encounter: Payer: Self-pay | Admitting: Gastroenterology

## 2015-12-21 ENCOUNTER — Ambulatory Visit: Payer: Medicare Other | Admitting: Nurse Practitioner

## 2015-12-21 ENCOUNTER — Telehealth: Payer: Self-pay | Admitting: Nurse Practitioner

## 2015-12-21 ENCOUNTER — Encounter: Payer: Self-pay | Admitting: Nurse Practitioner

## 2015-12-21 NOTE — Telephone Encounter (Signed)
Noted  

## 2015-12-21 NOTE — Telephone Encounter (Signed)
PT WAS A NO SHOW AND LETTER SENT  °

## 2016-04-06 ENCOUNTER — Ambulatory Visit (INDEPENDENT_AMBULATORY_CARE_PROVIDER_SITE_OTHER): Payer: Medicare Other | Admitting: Gastroenterology

## 2016-04-06 ENCOUNTER — Encounter: Payer: Self-pay | Admitting: Gastroenterology

## 2016-04-06 VITALS — BP 152/102 | HR 89 | Temp 97.5°F | Ht 67.0 in | Wt 173.0 lb

## 2016-04-06 DIAGNOSIS — K219 Gastro-esophageal reflux disease without esophagitis: Secondary | ICD-10-CM | POA: Diagnosis not present

## 2016-04-06 MED ORDER — OMEPRAZOLE-SODIUM BICARBONATE 40-1100 MG PO CAPS: 1.0000 | ORAL_CAPSULE | Freq: Every day | ORAL | 3 refills | Status: DC

## 2016-04-06 NOTE — Progress Notes (Signed)
Referring Provider: Lemmie Evens, MD Primary Care Physician:  Robert Bellow, MD  Primary GI: Dr. Oneida Alar   Chief Complaint  Patient presents with  . Gastroesophageal Reflux    worse at night    HPI:   Laura Mcgee is a 66 y.o. female presenting today with a history of GERD. Next colonoscopy 2022.   Can't get rid of the reflux. Taking 2 baclofen at night. Waking up at night coughing and choking. States she doesn't generally eat during the day. Tries to eat around 5 or 5:30 and goes to bed about 7:30 or 8. Hard to change habit of eating then going to bed. Sleeps on 3 pillows but does not have on risers. Has a hard scratch in her throat. Will sometimes have a BC powder but has decreased amount now.     Has tried all PPIs but not tried Zegerid. Currently on Dexilant.   Past Medical History:  Diagnosis Date  . Depression   . Difficult intubation    Patient has had history of laryngospasams  . GERD (gastroesophageal reflux disease)   . Hyperlipidemia   . Hypertension     Past Surgical History:  Procedure Laterality Date  . COLONOSCOPY WITH PROPOFOL N/A 04/07/2015   [facility]: 1. seven colorectal polyps removed 2. the left colon is redundant 3. small internal hemorrhoids (1 simple adenoma, 6 hyperplastic)   . CYST EXCISION Left    Arm  . ESOPHAGOGASTRODUODENOSCOPY (EGD) WITH PROPOFOL N/A 04/07/2015   [facility]: 1. Schatzki ring 2. Bravo capsule 34 cm from the teeth 3. moderate non-erosive gastritis.   Marland Kitchen MASS EXCISION N/A 2002   Lanyx  . MASS EXCISION Left 01/21/2015   Procedure: EXCISION CYST LEFT UPPER ARM;  Surgeon: Aviva Signs, MD;  Location: [facility];  Service: General;  Laterality: Left;  . POLYPECTOMY N/A 04/07/2015   Procedure: POLYPECTOMY;  Surgeon: Danie Binder, MD;  Location: [facility];  Service: Endoscopy;  Laterality: N/A;  Descending colon polyps x 3   . SAVORY DILATION N/A 04/07/2015   Procedure: SAVORY DILATION;  Surgeon: Danie Binder, MD;  Location: [facility];  Service: Endoscopy;  Laterality: N/A;    Current Outpatient Prescriptions  Medication Sig Dispense Refill  . acetaminophen (TYLENOL) 500 MG tablet Take 1,000 mg by mouth every 6 (six) hours as needed.    . Aspirin-Salicylamide-Caffeine (BC HEADACHE POWDER PO) Take 1 packet by mouth daily as needed (pain). Reported on 05/28/2015    . baclofen (LIORESAL) 10 MG tablet 1 PO 30 MINS PRIOR TO BREAKFAST AND SUPPER. (Patient taking differently: 1 PO 30 MINS PRIOR TO BREAKFAST AND SUPPER. Pt taking 2 at night) 60 each 11  . DEXILANT 60 MG capsule Take 60 mg by mouth daily.    Marland Kitchen HYDROcodone-acetaminophen (NORCO) 10-325 MG tablet Take 1-2 tablets by mouth every 6 (six) hours as needed.     Marland Kitchen lisinopril-hydrochlorothiazide (PRINZIDE,ZESTORETIC) 20-12.5 MG tablet Take 1 tablet by mouth 2 (two) times daily.     Marland Kitchen lovastatin (MEVACOR) 20 MG tablet Take 20 mg by mouth at bedtime.    . ranitidine (ZANTAC) 75 MG tablet Take 150 mg by mouth 2 (two) times daily.     Marland Kitchen venlafaxine XR (EFFEXOR-XR) 75 MG 24 hr capsule Take 150 mg by mouth daily with breakfast.     . sucralfate (CARAFATE) 1 GM/10ML suspension Take 10 mLs (1 g total) by mouth 4 (four) times daily as needed. (Patient not taking: Reported on 04/06/2016) 420  mL 1   No current facility-administered medications for this visit.     Allergies as of 04/06/2016 - Review Complete 04/06/2016  Allergen Reaction Noted  . Tetracyclines & related Other (See Comments) 01/15/2015    Family History  Problem Relation Age of Onset  . Pneumonia Mother   . Aneurysm Father   . Colon cancer Neg Hx     Social History   Social History  . Marital status: Single    Spouse name: N/A  . Number of children: N/A  . Years of education: N/A   Social History Main Topics  . Smoking status: Current Every Day Smoker    Packs/day: 0.10    Years: 25.00    Types: Cigarettes  . Smokeless tobacco: Never Used     Comment: 2-3 cigarettes daily  . Alcohol use 0.0  oz/week     Comment: Drinks about 24 oz beer a night; previously a 6-pack a day./ Smokes marjuana daily  . Drug use: Yes    Types: Marijuana     Comment: Daily  . Sexual activity: Not Asked   Other Topics Concern  . None   Social History Narrative  . None    Review of Systems: As mentioned in HPI   Physical Exam: BP (!) 152/102   Pulse 89   Temp 97.5 F (36.4 C) (Oral)   Ht 5\' 7"  (1.702 m)   Wt 173 lb (78.5 kg)   LMP 06/20/1997 (Approximate) Comment: post menopausal  BMI 27.10 kg/m  General:   Alert and oriented. No distress noted. Pleasant and cooperative.  Head:  Normocephalic and atraumatic. Eyes:  Conjuctiva clear without scleral icterus. Heart:  S1, S2 present without murmurs Abdomen:  +BS, soft, non-tender and non-distended. No rebound or guarding. No HSM or masses noted. Msk:  Symmetrical without gross deformities. Normal posture. Extremities:  Without edema. Neurologic:  Alert and  oriented x4;  grossly normal neurologically. Psych:  Alert and cooperative. Normal mood and affect.

## 2016-04-06 NOTE — Patient Instructions (Addendum)
The only thing you have not tried is Zegerid. Let's try this 30 minutes before breakfast daily. Let me know if any insurance problems. I sent to the pharmacy.  Continue to try and avoid eating then laying down. Wait at least 4 hours before laying down.   Dr. Oneida Alar will see you in 3 months.      Food Choices for Gastroesophageal Reflux Disease, Adult When you have gastroesophageal reflux disease (GERD), the foods you eat and your eating habits are very important. Choosing the right foods can help ease your discomfort. What guidelines do I need to follow?  Choose fruits, vegetables, whole grains, and low-fat dairy products.  Choose low-fat meat, fish, and poultry.  Limit fats such as oils, salad dressings, butter, nuts, and avocado.  Keep a food diary. This helps you identify foods that cause symptoms.  Avoid foods that cause symptoms. These may be different for everyone.  Eat small meals often instead of 3 large meals a day.  Eat your meals slowly, in a place where you are relaxed.  Limit fried foods.  Cook foods using methods other than frying.  Avoid drinking alcohol.  Avoid drinking large amounts of liquids with your meals.  Avoid bending over or lying down until 2-3 hours after eating. What foods are not recommended? These are some foods and drinks that may make your symptoms worse: Vegetables  Tomatoes. Tomato juice. Tomato and spaghetti sauce. Chili peppers. Onion and garlic. Horseradish. Fruits  Oranges, grapefruit, and lemon (fruit and juice). Meats  High-fat meats, fish, and poultry. This includes hot dogs, ribs, ham, sausage, salami, and bacon. Dairy  Whole milk and chocolate milk. Sour cream. Cream. Butter. Ice cream. Cream cheese. Drinks  Coffee and tea. Bubbly (carbonated) drinks or energy drinks. Condiments  Hot sauce. Barbecue sauce. Sweets/Desserts  Chocolate and cocoa. Donuts. Peppermint and spearmint. Fats and Oils  High-fat foods. This includes  [place] fries and potato chips. Other  Vinegar. Strong spices. This includes black pepper, white pepper, red pepper, cayenne, curry powder, cloves, ginger, and chili powder. The items listed above may not be a complete list of foods and drinks to avoid. Contact your dietitian for more information.  This information is not intended to replace advice given to you by your health care provider. Make sure you discuss any questions you have with your health care provider. Document Released: 08/23/2011 Document Revised: 07/30/2015 Document Reviewed: 12/26/2012 Elsevier Interactive Patient Education  2017 [employer].

## 2016-04-08 NOTE — Assessment & Plan Note (Signed)
Persistent GERD symptoms, EGD fairly up-to-date. Likely behavior playing a role (eating before going to bed). Discussed dietary/behavior modification. Continue Baclofen, trial Zegerid as this is the only PPI she has not tried yet. Return in 3 months to see Dr. Oneida Alar.

## 2016-04-11 NOTE — Progress Notes (Signed)
cc'ed to pcp °

## 2016-04-25 NOTE — Telephone Encounter (Signed)
I reviewed pt's results again with her. She is aware of her appt on 04/27/2016.

## 2016-04-25 NOTE — Telephone Encounter (Signed)
Patient states she has been taking steZegrid and she has been waiting at least 4 hours after she eats before she tries to lie down.  She is also experiencing some constipation with some nausea for about a week, however she denies any rectal bleeding, fever or abd pain.  The patient expressed some frustration in regards to her GERD not improving and would like to switch doctors, however I told her that [facility] and [facility] don't share patients.     Patient is on the schedule to see Magda Paganini 2/21 at 10:30 am and she is aware of that appt.  Routing to [place] as a [employer]

## 2016-04-25 NOTE — Telephone Encounter (Signed)
Patient called in stating she doesn't remember getting a call from [place] with her results from her tcs/egd.  Routing to [place] to go over results again

## 2016-04-26 ENCOUNTER — Telehealth: Payer: Self-pay | Admitting: Gastroenterology

## 2016-04-26 NOTE — Telephone Encounter (Signed)
Dr. Ardis Hughs reviewed records and has declined to accept patient. I informed Janett Billow at Dr. Karie Kirks office of this decision and she states that she will call patient to inform her of this.

## 2016-04-26 NOTE — Telephone Encounter (Signed)
Noted  

## 2016-04-26 NOTE — Telephone Encounter (Signed)
Records printed from Dr. Oneida Alar office from [facility] and placed on Dr. Ardis Hughs desk for review.  Dr. Karie Kirks is requesting patient see Dr. Eugenia Pancoast for a 2nd opinion.

## 2016-04-27 ENCOUNTER — Ambulatory Visit (INDEPENDENT_AMBULATORY_CARE_PROVIDER_SITE_OTHER): Payer: Medicare Other | Admitting: Gastroenterology

## 2016-04-27 ENCOUNTER — Encounter: Payer: Self-pay | Admitting: Gastroenterology

## 2016-04-27 VITALS — BP 121/85 | HR 103 | Temp 97.5°F | Ht 66.0 in | Wt 175.6 lb

## 2016-04-27 DIAGNOSIS — K219 Gastro-esophageal reflux disease without esophagitis: Secondary | ICD-10-CM | POA: Diagnosis not present

## 2016-04-27 DIAGNOSIS — K5900 Constipation, unspecified: Secondary | ICD-10-CM | POA: Insufficient documentation

## 2016-04-27 DIAGNOSIS — R05 Cough: Secondary | ICD-10-CM | POA: Diagnosis not present

## 2016-04-27 DIAGNOSIS — R053 Chronic cough: Secondary | ICD-10-CM

## 2016-04-27 NOTE — Assessment & Plan Note (Addendum)
Typical heartburn well controlled. Continues to eat/drink and lay down shortly afterwards. 5-6 beers every evening. Has made some positive dietary changes. Predominant symptoms of coughing at night. Cannot exclude post nasal drip, copd, lung malignancy, ENT etiology, GERD. She has had aggressive work up and management of GERD. At this point, we will start with Chest CT for productive cough in a chronic smoker, FH lung cancer. 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   I advised her she needs to cut back on her etoh use. Reinforced antireflux measures.

## 2016-04-27 NOTE — Patient Instructions (Addendum)
1. Start Amitiza 42mcg one to two times daily for constipation. Take with food. Samples provided. Call if you need/want a prescription. 2. Chest CT as scheduled. We will contact you with results.

## 2016-04-27 NOTE — Assessment & Plan Note (Signed)
Recent onset, likely temporary but provided Amitiza 29mcg to take BID with food. May continue long-term if needed, call for RX.

## 2016-04-27 NOTE — Progress Notes (Signed)
Primary Care Physician: Robert Bellow, MD  Primary Gastroenterologist:  Barney Drain, MD   Chief Complaint  Patient presents with  . Nausea  . Constipation    HPI: Laura Mcgee is a 66 y.o. female here for follow-up. She was last seen in January. She has a history of acid reflux, laryngeal spasm. States she was initially diagnosed with GERD around 2001/2002. Seen by ENT Dr. 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 Patient states she was started on Nexium back at that time. The remote past she used coral calcium with improvement of her reflux. In 2012 she began having increased "laryngeal spasms". She has been treated with multiple PPIs and H2 blockers with short-lived improvement. Her course is compensated by the fact that she complains of sinus drainage, she is a chronic smoker and has productive cough. Work up in January 2017 included EGD with bravo. She had moderate nonerosive gastritis, Schatzki ring. Nonacid reflux on bravo. Patient had difficulty operating device therefore symptom profile not available. She was started on baclofen 10 mg before breakfast and lunch but for more than a year she was taking this inappropriately.  At her last office visit she was advised to take baclofen 10 mg before breakfast and her evening meal. She was switched to Zegerid, the only PPI she's not been on. Patient was also provided guidelines regarding foods that she needed to avoid. Denies carbonated beverages. Limited caffeine in some tea and occasional coffee. Continues to consume 5-6 beers every evening. Continues to eat and lay down within an hour to hour and a half after meals. Eats mostly in the evenings. Avoiding fatty/fried foods. No noted improvement in her symptoms.  Symptoms mostly nocturnal. When she lays down at night although she is propped on a few pillows, she wakes up with a deep  scratchy throat and coughing. Eventually coughs greenish brownish material up. During the day she has episodes of laryngeal spasms which cuts her breath off. Denies any heartburn. No abdominal pain. No vomiting. No dysphagia. Over the past week she's had some constipation which is new for her. Her colonoscopy is up-to-date. No melena rectal bleeding.  Patient reports these issues have been pretty persistent for the past 6 years. She also is a chronic smoker, has smoked for over 47 years, pack and a half a day currently. Sister has stage IV lung cancer.      Current Outpatient Prescriptions  Medication Sig Dispense Refill  . amLODipine (NORVASC) 5 MG tablet     . HYDROcodone-acetaminophen (NORCO) 10-325 MG tablet Take 1-2 tablets by mouth every 6 (six) hours as needed.     Marland Kitchen lisinopril-hydrochlorothiazide (PRINZIDE,ZESTORETIC) 20-12.5 MG tablet Take 1 tablet by mouth 2 (two) times daily.     Marland Kitchen lovastatin (MEVACOR) 20 MG tablet Take 20 mg by mouth at bedtime.    Marland Kitchen omeprazole-sodium bicarbonate (ZEGERID) 40-1100 MG capsule Take 1 capsule by mouth daily before breakfast. 30 capsule 3  . venlafaxine XR (EFFEXOR-XR) 75 MG 24 hr capsule Take 150 mg by mouth daily with breakfast.     . baclofen (LIORESAL) 10 MG tablet 1 PO 30 MINS PRIOR TO BREAKFAST AND SUPPER. (Patient not taking: Reported on 04/27/2016) 60 each 11  . ranitidine (ZANTAC) 75 MG tablet Take 150 mg by mouth 2 (two) times daily.      No current facility-administered medications for this visit.  Allergies as of 04/27/2016 - Review Complete 04/27/2016  Allergen Reaction Noted  . Tetracyclines & related Other (See Comments) 01/15/2015    ROS:  General: Negative for anorexia, weight loss, fever, chills, fatigue, weakness. ENT: Negative for hoarseness, difficulty swallowing , nasal congestion. CV: Negative for chest pain, angina, palpitations, dyspnea on exertion, peripheral edema.  Respiratory: Negative for dyspnea at rest, dyspnea  on exertion, +++cough, +++sputum,   GI: See history of present illness. GU:  Negative for dysuria, hematuria, urinary incontinence, urinary frequency, nocturnal urination.  Endo: Negative for unusual weight change.    Physical Examination:   BP 121/85   Pulse (!) 103   Temp 97.5 F (36.4 C) (Oral)   Ht 5\' 6"  (1.676 m)   Wt 175 lb 9.6 oz (79.7 kg)   LMP 06/20/1997 (Approximate) Comment: post menopausal  BMI 28.34 kg/m   General: Well-nourished, well-developed in no acute distress.  Eyes: No icterus. Mouth: Oropharyngeal mucosa moist and pink , no lesions erythema or exudate. Lungs: Clear to auscultation bilaterally.  Heart: Regular rate and rhythm, no murmurs rubs or gallops.  Abdomen: Bowel sounds are normal, nontender, nondistended, no hepatosplenomegaly or masses, no abdominal bruits or hernia , no rebound or guarding.   Extremities: No lower extremity edema. No clubbing or deformities. Neuro: Alert and oriented x 4   Skin: Warm and dry, no jaundice.   Psych: Alert and cooperative, normal mood and affect.

## 2016-04-27 NOTE — Progress Notes (Signed)
cc'ed to pcp °

## 2016-04-28 ENCOUNTER — Ambulatory Visit (HOSPITAL_COMMUNITY)
Admission: RE | Admit: 2016-04-28 | Discharge: 2016-04-28 | Disposition: A | Discharge status: Home / Self Care | Payer: Medicare Other | Source: Ambulatory Visit | Attending: Gastroenterology | Admitting: Gastroenterology

## 2016-04-28 ENCOUNTER — Telehealth: Payer: Self-pay

## 2016-04-28 DIAGNOSIS — R053 Chronic cough: Secondary | ICD-10-CM

## 2016-04-28 DIAGNOSIS — R05 Cough: Secondary | ICD-10-CM | POA: Insufficient documentation

## 2016-04-28 DIAGNOSIS — K219 Gastro-esophageal reflux disease without esophagitis: Secondary | ICD-10-CM

## 2016-04-28 DIAGNOSIS — K5900 Constipation, unspecified: Secondary | ICD-10-CM | POA: Diagnosis present

## 2016-04-28 DIAGNOSIS — E279 Disorder of adrenal gland, unspecified: Secondary | ICD-10-CM | POA: Diagnosis not present

## 2016-04-28 DIAGNOSIS — J439 Emphysema, unspecified: Secondary | ICD-10-CM | POA: Insufficient documentation

## 2016-04-28 DIAGNOSIS — R911 Solitary pulmonary nodule: Secondary | ICD-10-CM | POA: Diagnosis not present

## 2016-04-28 LAB — POCT I-STAT CREATININE: CREATININE: 0.7 mg/dL (ref 0.44–1.00)

## 2016-04-28 MED ORDER — IOPAMIDOL (ISOVUE-300) INJECTION 61%
75.0000 mL | Freq: Once | INTRAVENOUS | Status: AC | PRN
  Administered 2016-04-28: 75 mL via INTRAVENOUS

## 2016-04-28 NOTE — Telephone Encounter (Signed)
Updated PMH

## 2016-04-28 NOTE — Telephone Encounter (Signed)
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 She has scar tissue from that and wanted LSL to be aware when she sees chest CT results. Routing message to LSL.

## 2016-05-03 NOTE — Progress Notes (Signed)
I tried to call pt x 3. Phone number not working, would disconnect when I Administrator. I called her daughter who is on her emergency contact. ( Doesn't look like she has anyone one form to be told anything). I asked Maudie Mercury to have her call me this afternoon or tomorrow and she said she will check on her, we have the correct number.

## 2016-05-03 NOTE — Progress Notes (Signed)
Please let patient know that she has changes in her lungs ?interstitial lung disease and findings s/o COPD, and a 52mm right lower lobe pulmonary nodule. No cancer seen. These things warrant her seeing a pulmonologist for further management either Dr. Luan Pulling locally or [facility] pulmonology. Please make referral. Likely cause of her cough.  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 Check with radiology to see how to order. Please arrange.   I would still consider ENT evaluation for "h/o throat mass and laryngeal spasms".

## 2016-05-04 ENCOUNTER — Telehealth: Payer: Self-pay | Admitting: Gastroenterology

## 2016-05-04 ENCOUNTER — Other Ambulatory Visit: Payer: Self-pay

## 2016-05-04 DIAGNOSIS — J392 Other diseases of pharynx: Secondary | ICD-10-CM

## 2016-05-04 DIAGNOSIS — R221 Localized swelling, mass and lump, neck: Secondary | ICD-10-CM

## 2016-05-04 DIAGNOSIS — J984 Other disorders of lung: Secondary | ICD-10-CM

## 2016-05-04 DIAGNOSIS — E279 Disorder of adrenal gland, unspecified: Secondary | ICD-10-CM

## 2016-05-04 DIAGNOSIS — R9389 Abnormal findings on diagnostic imaging of other specified body structures: Secondary | ICD-10-CM

## 2016-05-04 NOTE — Telephone Encounter (Signed)
I spoke to pt. See result notes.

## 2016-05-04 NOTE — Telephone Encounter (Signed)
Pt was out of minutes on her phone, but said that someone had tried calling her about her results. Please call her back at [phone]

## 2016-05-04 NOTE — Progress Notes (Signed)
Pt is aware of results. Ok to refer to Dr. Luan Pulling and ENT locally. Ok to schedule the Adrenal protocol CT scan.

## 2016-05-09 ENCOUNTER — Telehealth: Payer: Self-pay

## 2016-05-09 ENCOUNTER — Ambulatory Visit (HOSPITAL_COMMUNITY): Payer: Medicare Other

## 2016-05-09 NOTE — Telephone Encounter (Signed)
Received fax from Dr. Luan Pulling office, pt has appt 06/07/16 at 3:30pm. Called and informed pt. Gave her phone number to Dr. Luan Pulling office.

## 2016-05-16 ENCOUNTER — Telehealth: Payer: Self-pay | Admitting: Gastroenterology

## 2016-05-16 DIAGNOSIS — K219 Gastro-esophageal reflux disease without esophagitis: Secondary | ICD-10-CM

## 2016-05-16 MED ORDER — BACLOFEN 10 MG PO TABS: 10.0000 mg | ORAL_TABLET | Freq: Three times a day (TID) | ORAL | 3 refills | Status: DC

## 2016-05-16 NOTE — Telephone Encounter (Signed)
Please advise 

## 2016-05-16 NOTE — Addendum Note (Signed)
Addended by: Mahala Menghini on: 05/16/2016 05:58 PM   Modules accepted: Orders

## 2016-05-16 NOTE — Telephone Encounter (Signed)
Pt called asking if LSL would increase the dose of her Baclofen. She has been taking 3 a day and that seems to help her better. Please advise [phone]

## 2016-05-16 NOTE — Telephone Encounter (Signed)
Please let patient know RX for baclofen 10mg  TID sent to pharmacy.

## 2016-05-18 NOTE — Telephone Encounter (Signed)
Pt's phone is not working.

## 2016-05-26 ENCOUNTER — Ambulatory Visit (INDEPENDENT_AMBULATORY_CARE_PROVIDER_SITE_OTHER): Payer: Medicare Other | Admitting: Otolaryngology

## 2016-05-26 DIAGNOSIS — R05 Cough: Secondary | ICD-10-CM

## 2016-05-26 DIAGNOSIS — R1312 Dysphagia, oropharyngeal phase: Secondary | ICD-10-CM

## 2016-05-26 DIAGNOSIS — J381 Polyp of vocal cord and larynx: Secondary | ICD-10-CM

## 2016-05-26 DIAGNOSIS — R490 Dysphonia: Secondary | ICD-10-CM

## 2016-05-27 ENCOUNTER — Other Ambulatory Visit: Payer: Self-pay | Admitting: Otolaryngology

## 2016-05-30 ENCOUNTER — Other Ambulatory Visit: Payer: Self-pay | Admitting: Otolaryngology

## 2016-05-30 ENCOUNTER — Encounter (HOSPITAL_BASED_OUTPATIENT_CLINIC_OR_DEPARTMENT_OTHER): Payer: Self-pay | Admitting: *Deleted

## 2016-05-31 ENCOUNTER — Encounter (HOSPITAL_BASED_OUTPATIENT_CLINIC_OR_DEPARTMENT_OTHER): Payer: Self-pay | Admitting: *Deleted

## 2016-05-31 ENCOUNTER — Encounter (HOSPITAL_BASED_OUTPATIENT_CLINIC_OR_DEPARTMENT_OTHER)
Admission: RE | Disposition: A | Discharge status: Home / Self Care | Payer: Self-pay | Source: Ambulatory Visit | Attending: Otolaryngology

## 2016-05-31 ENCOUNTER — Ambulatory Visit (HOSPITAL_BASED_OUTPATIENT_CLINIC_OR_DEPARTMENT_OTHER)
Admission: RE | Admit: 2016-05-31 | Discharge: 2016-05-31 | Disposition: A | Discharge status: Home / Self Care | Payer: Medicare Other | Source: Ambulatory Visit | Attending: Otolaryngology | Admitting: Otolaryngology

## 2016-05-31 ENCOUNTER — Ambulatory Visit (HOSPITAL_BASED_OUTPATIENT_CLINIC_OR_DEPARTMENT_OTHER): Payer: Medicare Other | Admitting: Anesthesiology

## 2016-05-31 DIAGNOSIS — F17210 Nicotine dependence, cigarettes, uncomplicated: Secondary | ICD-10-CM | POA: Diagnosis not present

## 2016-05-31 DIAGNOSIS — R05 Cough: Secondary | ICD-10-CM | POA: Insufficient documentation

## 2016-05-31 DIAGNOSIS — R1310 Dysphagia, unspecified: Secondary | ICD-10-CM | POA: Insufficient documentation

## 2016-05-31 DIAGNOSIS — J383 Other diseases of vocal cords: Secondary | ICD-10-CM | POA: Insufficient documentation

## 2016-05-31 DIAGNOSIS — I10 Essential (primary) hypertension: Secondary | ICD-10-CM | POA: Diagnosis not present

## 2016-05-31 DIAGNOSIS — J382 Nodules of vocal cords: Secondary | ICD-10-CM | POA: Diagnosis present

## 2016-05-31 DIAGNOSIS — F329 Major depressive disorder, single episode, unspecified: Secondary | ICD-10-CM | POA: Insufficient documentation

## 2016-05-31 DIAGNOSIS — D141 Benign neoplasm of larynx: Secondary | ICD-10-CM | POA: Diagnosis not present

## 2016-05-31 DIAGNOSIS — J385 Laryngeal spasm: Secondary | ICD-10-CM | POA: Insufficient documentation

## 2016-05-31 DIAGNOSIS — D380 Neoplasm of uncertain behavior of larynx: Secondary | ICD-10-CM | POA: Diagnosis not present

## 2016-05-31 DIAGNOSIS — K219 Gastro-esophageal reflux disease without esophagitis: Secondary | ICD-10-CM | POA: Diagnosis not present

## 2016-05-31 DIAGNOSIS — R490 Dysphonia: Secondary | ICD-10-CM | POA: Insufficient documentation

## 2016-05-31 HISTORY — PX: MICROLARYNGOSCOPY: SHX5208

## 2016-05-31 LAB — POCT I-STAT, CHEM 8
BUN: 13 mg/dL (ref 6–20)
Calcium, Ion: 1.2 mmol/L (ref 1.15–1.40)
Chloride: 98 mmol/L — ABNORMAL LOW (ref 101–111)
Creatinine, Ser: 0.6 mg/dL (ref 0.44–1.00)
GLUCOSE: 94 mg/dL (ref 65–99)
HCT: 46 % (ref 36.0–46.0)
HEMOGLOBIN: 15.6 g/dL — AB (ref 12.0–15.0)
POTASSIUM: 3.9 mmol/L (ref 3.5–5.1)
SODIUM: 133 mmol/L — AB (ref 135–145)
TCO2: 24 mmol/L (ref 0–100)

## 2016-05-31 SURGERY — MICROLARYNGOSCOPY
Anesthesia: General | Site: Throat

## 2016-05-31 MED ORDER — FENTANYL CITRATE (PF) 100 MCG/2ML IJ SOLN
INTRAMUSCULAR | Status: AC
  Filled 2016-05-31: qty 2

## 2016-05-31 MED ORDER — EPINEPHRINE PF 1 MG/ML IJ SOLN
INTRAMUSCULAR | Status: DC | PRN
  Administered 2016-05-31: 1 mg

## 2016-05-31 MED ORDER — MIDAZOLAM HCL 2 MG/2ML IJ SOLN
1.0000 mg | INTRAMUSCULAR | Status: DC | PRN
  Administered 2016-05-31: 2 mg via INTRAVENOUS

## 2016-05-31 MED ORDER — FENTANYL CITRATE (PF) 100 MCG/2ML IJ SOLN: 25.0000 ug | INTRAMUSCULAR | Status: DC | PRN

## 2016-05-31 MED ORDER — MIDAZOLAM HCL 2 MG/2ML IJ SOLN
INTRAMUSCULAR | Status: AC
Start: 2016-05-31 — End: 2016-05-31
  Filled 2016-05-31: qty 2

## 2016-05-31 MED ORDER — PROPOFOL 10 MG/ML IV BOLUS
INTRAVENOUS | Status: AC
  Filled 2016-05-31: qty 20

## 2016-05-31 MED ORDER — MEPERIDINE HCL 25 MG/ML IJ SOLN: 6.2500 mg | INTRAMUSCULAR | Status: DC | PRN

## 2016-05-31 MED ORDER — DEXAMETHASONE SODIUM PHOSPHATE 10 MG/ML IJ SOLN
INTRAMUSCULAR | Status: AC
Start: 2016-05-31 — End: 2016-05-31
  Filled 2016-05-31: qty 1

## 2016-05-31 MED ORDER — DEXAMETHASONE SODIUM PHOSPHATE 4 MG/ML IJ SOLN
INTRAMUSCULAR | Status: DC | PRN
  Administered 2016-05-31: 10 mg via INTRAVENOUS

## 2016-05-31 MED ORDER — LIDOCAINE 2% (20 MG/ML) 5 ML SYRINGE
INTRAMUSCULAR | Status: DC | PRN
  Administered 2016-05-31: 100 mg via INTRAVENOUS

## 2016-05-31 MED ORDER — SUCCINYLCHOLINE CHLORIDE 200 MG/10ML IV SOSY
PREFILLED_SYRINGE | INTRAVENOUS | Status: AC
  Filled 2016-05-31: qty 10

## 2016-05-31 MED ORDER — LIDOCAINE 2% (20 MG/ML) 5 ML SYRINGE
INTRAMUSCULAR | Status: AC
  Filled 2016-05-31: qty 5

## 2016-05-31 MED ORDER — SCOPOLAMINE 1 MG/3DAYS TD PT72: 1.0000 | MEDICATED_PATCH | Freq: Once | TRANSDERMAL | Status: DC | PRN

## 2016-05-31 MED ORDER — PROPOFOL 10 MG/ML IV BOLUS
INTRAVENOUS | Status: DC | PRN
  Administered 2016-05-31 (×2): 50 mg via INTRAVENOUS
  Administered 2016-05-31: 200 mg via INTRAVENOUS

## 2016-05-31 MED ORDER — LACTATED RINGERS IV SOLN
INTRAVENOUS | Status: DC
  Administered 2016-05-31 (×2): via INTRAVENOUS

## 2016-05-31 MED ORDER — FENTANYL CITRATE (PF) 100 MCG/2ML IJ SOLN
50.0000 ug | INTRAMUSCULAR | Status: DC | PRN
  Administered 2016-05-31: 100 ug via INTRAVENOUS

## 2016-05-31 MED ORDER — SUCCINYLCHOLINE CHLORIDE 20 MG/ML IJ SOLN
INTRAMUSCULAR | Status: DC | PRN
  Administered 2016-05-31: 100 mg via INTRAVENOUS

## 2016-05-31 MED ORDER — ONDANSETRON HCL 4 MG/2ML IJ SOLN
INTRAMUSCULAR | Status: DC | PRN
  Administered 2016-05-31: 4 mg via INTRAVENOUS

## 2016-05-31 MED ORDER — METOCLOPRAMIDE HCL 5 MG/ML IJ SOLN: 10.0000 mg | Freq: Once | INTRAMUSCULAR | Status: DC | PRN

## 2016-05-31 MED ORDER — ONDANSETRON HCL 4 MG/2ML IJ SOLN
INTRAMUSCULAR | Status: AC
Start: 2016-05-31 — End: 2016-05-31
  Filled 2016-05-31: qty 2

## 2016-05-31 SURGICAL SUPPLY — 27 items
CANISTER SUCT 1200ML W/VALVE (MISCELLANEOUS) ×4 IMPLANT
GAUZE SPONGE 4X4 12PLY STRL LF (GAUZE/BANDAGES/DRESSINGS) ×4 IMPLANT
GLOVE BIO SURGEON STRL SZ7 (GLOVE) ×3 IMPLANT
GLOVE BIO SURGEON STRL SZ7.5 (GLOVE) ×4 IMPLANT
GLOVE SURG SS PI 6.5 STRL IVOR (GLOVE) ×3 IMPLANT
GOWN STRL REUS W/ TWL LRG LVL3 (GOWN DISPOSABLE) ×2 IMPLANT
GOWN STRL REUS W/TWL LRG LVL3 (GOWN DISPOSABLE) ×8
GUARD TEETH (MISCELLANEOUS) IMPLANT
MARKER SKIN DUAL TIP RULER LAB (MISCELLANEOUS) IMPLANT
NDL SAFETY ECLIPSE 18X1.5 (NEEDLE) ×1 IMPLANT
NDL SPNL 22GX7 QUINCKE BK (NEEDLE) IMPLANT
NDL SPNL 25GX3.5 QUINCKE BL (NEEDLE) IMPLANT
NDL TRANS ORAL INJECTION (NEEDLE) IMPLANT
NEEDLE HYPO 18GX1.5 SHARP (NEEDLE) ×4
NEEDLE SPNL 22GX7 QUINCKE BK (NEEDLE) IMPLANT
NEEDLE SPNL 25GX3.5 QUINCKE BL (NEEDLE) IMPLANT
NEEDLE TRANS ORAL INJECTION (NEEDLE) IMPLANT
NS IRRIG 1000ML POUR BTL (IV SOLUTION) ×4 IMPLANT
PATTIES SURGICAL .5 X3 (DISPOSABLE) ×4 IMPLANT
SHEET MEDIUM DRAPE 40X70 STRL (DRAPES) ×4 IMPLANT
SLEEVE SCD COMPRESS KNEE MED (MISCELLANEOUS) ×3 IMPLANT
SOLUTION BUTLER CLEAR DIP (MISCELLANEOUS) ×3 IMPLANT
SYR CONTROL 10ML LL (SYRINGE) IMPLANT
SYR TB 1ML LL NO SAFETY (SYRINGE) ×3 IMPLANT
TOWEL OR 17X24 6PK STRL BLUE (TOWEL DISPOSABLE) ×4 IMPLANT
TUBE CONNECTING 20'X1/4 (TUBING) ×1
TUBE CONNECTING 20X1/4 (TUBING) ×3 IMPLANT

## 2016-05-31 NOTE — Anesthesia Postprocedure Evaluation (Signed)
Anesthesia Post Note  Patient: Laura Mcgee  Procedure(s) Performed: Procedure(s) (LRB): MICRO LARYNGOSCOPY WITH BIOPSY OF VOCAL CORD LESION (N/A)  Patient location during evaluation: PACU Anesthesia Type: General Level of consciousness: awake and alert Pain management: pain level controlled Vital Signs Assessment: post-procedure vital signs reviewed and stable Respiratory status: spontaneous breathing, nonlabored ventilation, respiratory function stable and patient connected to nasal cannula oxygen Cardiovascular status: blood pressure returned to baseline and stable Postop Assessment: no signs of nausea or vomiting Anesthetic complications: no       Last Vitals:  Vitals:   05/31/16 1315 05/31/16 1348  BP: (!) 140/95 (!) 150/90  Pulse: 96 93  Resp: (!) 21 18  Temp:  36.7 C    Last Pain:  Vitals:   05/31/16 1348  TempSrc:   PainSc: 0-No pain                 Montez Hageman

## 2016-05-31 NOTE — Transfer of Care (Signed)
Immediate Anesthesia Transfer of Care Note  Patient: Laura Mcgee  Procedure(s) Performed: Procedure(s): MICRO LARYNGOSCOPY WITH BIOPSY OF VOCAL CORD LESION (N/A)  Patient Location: PACU  Anesthesia Type:General  Level of Consciousness: awake and alert   Airway & Oxygen Therapy: Patient Spontanous Breathing and Patient connected to face mask oxygen  Post-op Assessment: Report given to RN and Post -op Vital signs reviewed and stable  Post vital signs: Reviewed and stable  Last Vitals:  Vitals:   05/31/16 1016  BP: (!) 149/99  Pulse: 84  Resp: 20  Temp: 36.7 C    Last Pain:  Vitals:   05/31/16 1016  TempSrc: Oral         Complications: No apparent anesthesia complications

## 2016-05-31 NOTE — Op Note (Signed)
DATE OF PROCEDURE:  05/31/2016                              OPERATIVE REPORT  SURGEON:  Leta Baptist, MD  PREOPERATIVE DIAGNOSES: 1. Hoarseness 2. Vocal cord lesions  POSTOPERATIVE DIAGNOSES: 1. 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  PROCEDURE PERFORMED:  Microdirect laryngoscopy and biopsy  ANESTHESIA:  General endotracheal tube anesthesia.  COMPLICATIONS:  None.  ESTIMATED BLOOD LOSS:  Minimal.  INDICATION FOR PROCEDURE:  Laura Mcgee is a 66 y.o. female with a history of hoarseness, chronic cough, and dysphagia. On her laryngoscopy examination, the patient was noted to have possible vocal cord lesions bilaterally. The decision was therefore made for the patient to undergo MicroDirect laryngoscopy and biopsy under general anesthesia. The risks, benefits, alternatives, and details of the procedure were discussed with the patient.  Questions were invited and answered.  Informed consent was obtained.  DESCRIPTION:  The patient was taken to the operating room and placed supine on the operating table.  General endotracheal tube anesthesia was administered by the anesthesiologist.  The patient was positioned and prepped and draped in a standard fashion for direct laryngoscopy.   A Dedo laryngoscope was used for examination. The laryngoscope was inserted via the oral cavity into the pharynx. Examination of the vallecula, epiglottis, aryepiglottic folds, and piriform sinuses were all normal. Examination of the vocal cords revealed a left vocal cord nodule, with significant leukoplakia over the left vocal cord nodule. The Dedo laryngoscope was suspended with the Lewy suspender. A microscope was brought into the field. Under the operating microscope, the left vocal cord nodule was removed. The area of leukoplakia was also biopsied. Hemostasis was achieved with pledgets soaked with epinephrine.   The care of the patient was turned over to the anesthesiologist.  The patient was awakened  from anesthesia without difficulty.  The patient was extubated and transferred to the recovery room in good condition.  OPERATIVE FINDINGS: Left vocal cord nodule and leukoplakia.  SPECIMEN:  Left vocal cord nodule and biopsy specimens.  FOLLOWUP CARE:  The patient will be discharged home once awake and alert. The patient will follow up in my office in approximately 2 weeks.  Laura Mcgee 05/31/2016 12:44 PM

## 2016-05-31 NOTE — H&P (Signed)
Cc: Chronic cough  HPI: The patient is a 66 y/o female who presents today for evaluation of chronic cough. The patient is seen in consultation requested by [facility]. The patient has noted symptoms for the past year. She notes associated hoarseness and dysphagia. The patient has a history of laryngeal spasms and is currently taking Baclofen three times a day. The patient also has a history of GERD and takes ranitidine prn. The patient had a laryngeal mass removed in 2000 by Dr. Ardis Hughs. This was benign according to the patient. The patient is a 40 + pack year smoker.   The patient's review of systems (constitutional, eyes, ENT, cardiovascular, respiratory, GI, musculoskeletal, skin, neurologic, psychiatric, endocrine, hematologic, allergic) is noted in the ROS questionnaire.  It is reviewed with the patient.   Family health history: Heart disease.  Major events: Mass removed from larynx.  Ongoing medical problems: GERD, dysphagia, hypertension, arthritis, depression, headache.  Social history: The patient is single.  She smokes two pack of cigarettes per day.  She drinks 4-5 alcoholic drinks per night.  Seh denies the use of illegal drugs.  Exam General: Communicates without difficulty, well nourished, no acute distress. Head: Normocephalic, no evidence injury, no tenderness, facial buttresses intact without stepoff. Eyes: PERRL, EOMI.  No scleral icterus, conjunctivae clear. Ears: External auditory canals clear bilaterally.  There is no edema or erythema.  Tympanic membrane is within normal limits bilaterally. Nose: Normal skin and external support.  Anterior rhinoscopy reveals healthy pink mucosa over the septum and turbinates.  No lesions or polyps were seen. Oral cavity: Lips without lesions, oral mucosa moist, no masses or lesions seen. Indirect  mirror laryngoscopy could not be tolerated. Pharynx: Clear, no erythema. Pharynx: Erythematous, edematous. Neck: Supple, full range of  motion, no lymphadenopathy, no masses palpable. Salivary: Parotid and submandibular glands without mass. Neuro:  CN 2-12 grossly intact. Gait normal. Vestibular: No nystagmus at any point of gaze.   Procedure:  Flexible Fiberoptic Laryngoscopy -- Risks, benefits, and alternatives of flexible endoscopy were explained to the patient.  Specific mention was made of the risk of throat numbness with difficulty swallowing, possible bleeding from the nose and mouth, and pain from the procedure.  The patient gave oral consent to proceed.  The nasal cavities were decongested and anesthetised with a combination of oxymetazoline and 4% lidocaine solution.  The flexible scope was inserted into the right nasal cavity and advanced towards the nasopharynx.  Visualized mucosa over the turbinates and septum were normal.  The nasopharynx was clear.  Oropharyngeal walls were symmetric and mobile without lesion, mass, or edema.  Hypopharynx was also without  lesion or edema.  Larynx was mobile without lesions.  No lesions or asymmetry in the supraglottic larynx.  Arytenoid mucosa was edematous with slight erythema.  True vocal folds were pale yellow and edematous with small growths noted bilaterally.  Base of tongue was within normal limits. The patient tolerated the procedure well.   Assessment  1.  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 No other suspicious mass or lesion is noted on today's fiberoptic laryngoscopy exam.  Plan   1.  Laryngoscopy findings are reviewed with the patient.  2.  Recommend direct laryngoscopy with biopsy. The risks, benefits, alternatives, and details of the procedure are reviewed with the patient. Questions are invited and answered. 3.  The patient is interested in proceeding with the procedure.  We will schedule the procedure in accordance with the family schedule.

## 2016-05-31 NOTE — Discharge Instructions (Addendum)
The patient may resume all her previous activities and diet.    Post Anesthesia Home Care Instructions  Activity: Get plenty of rest for the remainder of the day. A responsible individual must stay with you for 24 hours following the procedure.  For the next 24 hours, DO NOT: -Drive a car -Paediatric nurse -Drink alcoholic beverages -Take any medication unless instructed by your physician -Make any legal decisions or sign important papers.  Meals: Start with liquid foods such as gelatin or soup. Progress to regular foods as tolerated. Avoid greasy, spicy, heavy foods. If nausea and/or vomiting occur, drink only clear liquids until the nausea and/or vomiting subsides. Call your physician if vomiting continues.  Special Instructions/Symptoms: Your throat may feel dry or sore from the anesthesia or the breathing tube placed in your throat during surgery. If this causes discomfort, gargle with warm salt water. The discomfort should disappear within 24 hours.  If you had a scopolamine patch placed behind your ear for the management of post- operative nausea and/or vomiting:  1. The medication in the patch is effective for 72 hours, after which it should be removed.  Wrap patch in a tissue and discard in the trash. Wash hands thoroughly with soap and water. 2. You may remove the patch earlier than 72 hours if you experience unpleasant side effects which may include dry mouth, dizziness or visual disturbances. 3. Avoid touching the patch. Wash your hands with soap and water after contact with the patch.

## 2016-05-31 NOTE — Anesthesia Procedure Notes (Signed)
Procedure Name: Intubation Date/Time: 05/31/2016 12:21 PM Performed by: Lieutenant Diego Pre-anesthesia Checklist: Patient identified, Emergency Drugs available, Suction available and Patient being monitored Patient Re-evaluated:Patient Re-evaluated prior to inductionOxygen Delivery Method: Circle system utilized Preoxygenation: Pre-oxygenation with 100% oxygen Intubation Type: IV induction Ventilation: Mask ventilation without difficulty Laryngoscope Size: Miller and 2 Grade View: Grade I Tube type: Oral Tube size: 6.5 mm Number of attempts: 1 Airway Equipment and Method: Stylet and Oral airway Placement Confirmation: ETT inserted through vocal cords under direct vision,  positive ETCO2 and breath sounds checked- equal and bilateral Secured at: 22 cm Tube secured with: Tape Dental Injury: Teeth and Oropharynx as per pre-operative assessment

## 2016-05-31 NOTE — Anesthesia Preprocedure Evaluation (Signed)
Anesthesia Evaluation  Patient identified by MRN, date of birth, ID band Patient awake    Reviewed: Allergy & Precautions, NPO status , Patient's Chart, lab work & pertinent test results  History of Anesthesia Complications (+) history of anesthetic complications (h/o laryngospasm. NOT a difficult intubation. Grade 1 view)  Airway Mallampati: II  TM Distance: >3 FB Neck ROM: Full    Dental no notable dental hx. (+) Upper Dentures, Partial Lower   Pulmonary Current Smoker, PE (1973)   Pulmonary exam normal breath sounds clear to auscultation       Cardiovascular hypertension, Pt. on medications Normal cardiovascular exam Rhythm:Regular Rate:Normal     Neuro/Psych negative neurological ROS  negative psych ROS   GI/Hepatic Neg liver ROS, GERD  Poorly Controlled,  Endo/Other  negative endocrine ROS  Renal/GU negative Renal ROS  negative genitourinary   Musculoskeletal negative musculoskeletal ROS (+)   Abdominal   Peds negative pediatric ROS (+)  Hematology negative hematology ROS (+)   Anesthesia Other Findings   Reproductive/Obstetrics negative OB ROS                            Anesthesia Physical Anesthesia Plan  ASA: III  Anesthesia Plan: General   Post-op Pain Management:    Induction: Intravenous  Airway Management Planned: Oral ETT  Additional Equipment:   Intra-op Plan:   Post-operative Plan: Extubation in OR  Informed Consent: I have reviewed the patients History and Physical, chart, labs and discussed the procedure including the risks, benefits and alternatives for the proposed anesthesia with the patient or authorized representative who has indicated his/her understanding and acceptance.   Dental advisory given  Plan Discussed with: CRNA  Anesthesia Plan Comments:         Anesthesia Quick Evaluation

## 2016-06-01 ENCOUNTER — Encounter (HOSPITAL_BASED_OUTPATIENT_CLINIC_OR_DEPARTMENT_OTHER): Payer: Self-pay | Admitting: Otolaryngology

## 2016-06-06 ENCOUNTER — Encounter (HOSPITAL_COMMUNITY): Payer: Self-pay | Admitting: Emergency Medicine

## 2016-06-06 ENCOUNTER — Emergency Department (HOSPITAL_COMMUNITY): Payer: Medicare Other

## 2016-06-06 ENCOUNTER — Emergency Department (HOSPITAL_COMMUNITY)
Admission: EM | Admit: 2016-06-06 | Discharge: 2016-06-06 | Disposition: A | Discharge status: Home / Self Care | Payer: Medicare Other | Attending: Emergency Medicine | Admitting: Emergency Medicine

## 2016-06-06 DIAGNOSIS — Z79899 Other long term (current) drug therapy: Secondary | ICD-10-CM | POA: Diagnosis not present

## 2016-06-06 DIAGNOSIS — R519 Headache, unspecified: Secondary | ICD-10-CM

## 2016-06-06 DIAGNOSIS — R51 Headache: Secondary | ICD-10-CM | POA: Insufficient documentation

## 2016-06-06 DIAGNOSIS — Z5181 Encounter for therapeutic drug level monitoring: Secondary | ICD-10-CM | POA: Insufficient documentation

## 2016-06-06 DIAGNOSIS — F17210 Nicotine dependence, cigarettes, uncomplicated: Secondary | ICD-10-CM | POA: Insufficient documentation

## 2016-06-06 DIAGNOSIS — R42 Dizziness and giddiness: Secondary | ICD-10-CM

## 2016-06-06 DIAGNOSIS — I10 Essential (primary) hypertension: Secondary | ICD-10-CM | POA: Diagnosis not present

## 2016-06-06 DIAGNOSIS — E876 Hypokalemia: Secondary | ICD-10-CM | POA: Diagnosis not present

## 2016-06-06 LAB — CBC WITH DIFFERENTIAL/PLATELET
Basophils Absolute: 0 10*3/uL (ref 0.0–0.1)
Basophils Relative: 0 %
EOS ABS: 0.1 10*3/uL (ref 0.0–0.7)
EOS PCT: 1 %
HCT: 43.6 % (ref 36.0–46.0)
Hemoglobin: 15.8 g/dL — ABNORMAL HIGH (ref 12.0–15.0)
LYMPHS ABS: 2.1 10*3/uL (ref 0.7–4.0)
Lymphocytes Relative: 14 %
MCH: 32.6 pg (ref 26.0–34.0)
MCHC: 36.2 g/dL — ABNORMAL HIGH (ref 30.0–36.0)
MCV: 90.1 fL (ref 78.0–100.0)
Monocytes Absolute: 2 10*3/uL — ABNORMAL HIGH (ref 0.1–1.0)
Monocytes Relative: 13 %
Neutro Abs: 10.6 10*3/uL — ABNORMAL HIGH (ref 1.7–7.7)
Neutrophils Relative %: 72 %
PLATELETS: 197 10*3/uL (ref 150–400)
RBC: 4.84 MIL/uL (ref 3.87–5.11)
RDW: 13.6 % (ref 11.5–15.5)
WBC: 14.8 10*3/uL — AB (ref 4.0–10.5)

## 2016-06-06 LAB — COMPREHENSIVE METABOLIC PANEL
ALK PHOS: 115 U/L (ref 38–126)
ALT: 20 U/L (ref 14–54)
ANION GAP: 16 — AB (ref 5–15)
AST: 20 U/L (ref 15–41)
Albumin: 3 g/dL — ABNORMAL LOW (ref 3.5–5.0)
BILIRUBIN TOTAL: 0.7 mg/dL (ref 0.3–1.2)
BUN: 23 mg/dL — ABNORMAL HIGH (ref 6–20)
CO2: 19 mmol/L — ABNORMAL LOW (ref 22–32)
Calcium: 9 mg/dL (ref 8.9–10.3)
Chloride: 91 mmol/L — ABNORMAL LOW (ref 101–111)
Creatinine, Ser: 1.04 mg/dL — ABNORMAL HIGH (ref 0.44–1.00)
GFR, EST NON AFRICAN AMERICAN: 55 mL/min — AB (ref >60)
GLUCOSE: 109 mg/dL — AB (ref 65–99)
Potassium: 2.6 mmol/L — CL (ref 3.5–5.1)
Sodium: 126 mmol/L — ABNORMAL LOW (ref 135–145)
TOTAL PROTEIN: 7.3 g/dL (ref 6.5–8.1)

## 2016-06-06 LAB — PROTIME-INR
INR: 0.95
PROTHROMBIN TIME: 12.7 s (ref 11.4–15.2)

## 2016-06-06 LAB — URINALYSIS, ROUTINE W REFLEX MICROSCOPIC
Bilirubin Urine: NEGATIVE
GLUCOSE, UA: NEGATIVE mg/dL
Ketones, ur: 5 mg/dL — AB
NITRITE: NEGATIVE
PH: 5 (ref 5.0–8.0)
Protein, ur: 30 mg/dL — AB
SPECIFIC GRAVITY, URINE: 1.009 (ref 1.005–1.030)

## 2016-06-06 LAB — I-STAT CG4 LACTIC ACID, ED
LACTIC ACID, VENOUS: 0.75 mmol/L (ref 0.5–1.9)
Lactic Acid, Venous: 2.27 mmol/L (ref 0.5–1.9)

## 2016-06-06 LAB — MAGNESIUM: MAGNESIUM: 2.1 mg/dL (ref 1.7–2.4)

## 2016-06-06 MED ORDER — PIPERACILLIN-TAZOBACTAM 3.375 G IVPB 30 MIN
3.3750 g | Freq: Once | INTRAVENOUS | Status: AC
  Administered 2016-06-06: 3.375 g via INTRAVENOUS
  Filled 2016-06-06: qty 50

## 2016-06-06 MED ORDER — SODIUM CHLORIDE 0.9 % IV BOLUS (SEPSIS)
1000.0000 mL | Freq: Once | INTRAVENOUS | Status: AC
  Administered 2016-06-06: 1000 mL via INTRAVENOUS

## 2016-06-06 MED ORDER — POTASSIUM CHLORIDE CRYS ER 20 MEQ PO TBCR: 20.0000 meq | EXTENDED_RELEASE_TABLET | Freq: Every day | ORAL | 0 refills | Status: DC

## 2016-06-06 MED ORDER — SODIUM CHLORIDE 0.9 % IV SOLN: 30.0000 meq | Freq: Once | INTRAVENOUS | Status: DC

## 2016-06-06 MED ORDER — ACETAMINOPHEN 325 MG PO TABS
650.0000 mg | ORAL_TABLET | Freq: Once | ORAL | Status: AC
  Administered 2016-06-06: 650 mg via ORAL
  Filled 2016-06-06: qty 2

## 2016-06-06 MED ORDER — POTASSIUM CHLORIDE 10 MEQ/100ML IV SOLN
10.0000 meq | INTRAVENOUS | Status: AC
  Administered 2016-06-06 (×3): 10 meq via INTRAVENOUS
  Filled 2016-06-06 (×3): qty 100

## 2016-06-06 MED ORDER — POTASSIUM CHLORIDE CRYS ER 20 MEQ PO TBCR
40.0000 meq | EXTENDED_RELEASE_TABLET | Freq: Once | ORAL | Status: AC
  Administered 2016-06-06: 40 meq via ORAL
  Filled 2016-06-06: qty 2

## 2016-06-06 MED ORDER — SODIUM CHLORIDE 0.9 % IV BOLUS (SEPSIS)
500.0000 mL | Freq: Once | INTRAVENOUS | Status: AC
  Administered 2016-06-06: 500 mL via INTRAVENOUS

## 2016-06-06 NOTE — ED Notes (Signed)
Meal tray given to patient as requested and approved by Dr Eulis Foster.

## 2016-06-06 NOTE — ED Triage Notes (Signed)
Pt c/o gen weakness, dizziness x 5 days. States had syncopal episode Wednesday, not witnessed.denies vomitng . Diarrhea x 2 days with 2 In last 24 hrs. A.o. No obvious weakness noted. Mm moist.

## 2016-06-06 NOTE — ED Notes (Signed)
Patient ambulated around nursing station without any difficulties,           B/P 103-70 P-97 R-20 O2-96%

## 2016-06-06 NOTE — ED Provider Notes (Signed)
[facility] DEPT Provider Note   CSN: [id number] Arrival date & time: 06/06/16  1523     History   Chief Complaint Chief Complaint  Patient presents with  . Dizziness    HPI Laura Mcgee is a 66 y.o. female.  She presents for evaluation of "feeling sick".  She has been ill for 6 days, initially with sore throat, then myalgias, followed by dizziness and fatigue.  She also feels "dizzy" when she walks, like "my balance is off".  She denies headache chest pain shortness of breath focal weakness or paresthesia.  1 week ago she had laryngoscopy with removal of "cysts".  She was evaluated for shortness of breath 1 month ago, with CT of the chest.  Apparently it was felt that she had esophageal reflux causing her shortness of breath.  She is here with her daughter.  There are no other known modifying factors.   HPI  Past Medical History:  Diagnosis Date  . Depression   . Difficult intubation    Patient has had history of laryngospasams  . GERD (gastroesophageal reflux disease)   . Hyperlipidemia   . Hypertension   . Pulmonary embolism [facility]) 1973    Patient Active Problem List   Diagnosis Date Noted  . Chronic cough 04/27/2016  . Constipation 04/27/2016  . GERD (gastroesophageal reflux disease) 03/30/2015  . Dysphagia 03/30/2015  . Encounter for screening colonoscopy 03/30/2015    Past Surgical History:  Procedure Laterality Date  . COLONOSCOPY WITH PROPOFOL N/A 04/07/2015   SLF: 1. seven colorectal polyps removed 2. the left colon is redundant 3. small internal hemorrhoids (1 simple adenoma, 6 hyperplastic)   . CYST EXCISION Left    Arm  . ESOPHAGOGASTRODUODENOSCOPY (EGD) WITH PROPOFOL N/A 04/07/2015   SLF: 1. Schatzki ring 2. Bravo capsule 34 cm from the teeth 3. moderate non-erosive gastritis.   Marland Kitchen MASS EXCISION N/A 2002   Lanyx  . MASS EXCISION Left 01/21/2015   Procedure: EXCISION CYST LEFT UPPER ARM;  Surgeon: Aviva Signs, MD;  Location: [facility];  Service: General;   Laterality: Left;  Marland Kitchen MICROLARYNGOSCOPY N/A 05/31/2016   Procedure: MICRO LARYNGOSCOPY WITH BIOPSY OF VOCAL CORD LESION;  Surgeon: Leta Baptist, MD;  Location: [facility];  Service: ENT;  Laterality: N/A;  . POLYPECTOMY N/A 04/07/2015   Procedure: POLYPECTOMY;  Surgeon: Danie Binder, MD;  Location: [facility];  Service: Endoscopy;  Laterality: N/A;  Descending colon polyps x 3   . SAVORY DILATION N/A 04/07/2015   Procedure: SAVORY DILATION;  Surgeon: Danie Binder, MD;  Location: [facility];  Service: Endoscopy;  Laterality: N/A;    OB History    No data available       Home Medications    Prior to Admission medications   Medication Sig Start Date End Date Taking? Authorizing Provider  amLODipine (NORVASC) 5 MG tablet Take 5 mg by mouth daily.  04/02/16  Yes Historical Provider, MD  Aspirin-Salicylamide-Caffeine (BC HEADACHE) 325-95-16 MG TABS Take 1 packet by mouth 2 (two) times daily.   Yes Historical Provider, MD  baclofen (LIORESAL) 10 MG tablet Take 1 tablet (10 mg total) by mouth 3 (three) times daily before meals. 05/16/16  Yes Mahala Menghini, PA-C  HYDROcodone-acetaminophen (NORCO) 10-325 MG tablet Take 0.5-1 tablets by mouth every 6 (six) hours as needed.  03/11/15  Yes Historical Provider, MD  lisinopril-hydrochlorothiazide (PRINZIDE,ZESTORETIC) 20-12.5 MG tablet Take 1 tablet by mouth 2 (two) times daily.    Yes Historical  Provider, MD  lovastatin (MEVACOR) 20 MG tablet Take 20 mg by mouth daily.    Yes Historical Provider, MD  ranitidine (ZANTAC) 75 MG tablet Take 150 mg by mouth daily as needed for heartburn.    Yes Historical Provider, MD  venlafaxine XR (EFFEXOR-XR) 75 MG 24 hr capsule Take 150 mg by mouth daily with breakfast.    Yes Historical Provider, MD  omeprazole-sodium bicarbonate (ZEGERID) 40-1100 MG capsule Take 1 capsule by mouth daily before breakfast. Patient not taking: Reported on 06/06/2016 04/06/16   Annitta Needs, NP  potassium chloride SA  (K-DUR,KLOR-CON) 20 MEQ tablet Take 1 tablet (20 mEq total) by mouth daily. 06/06/16   Daleen Bo, MD    Family History Family History  Problem Relation Age of Onset  . Pneumonia Mother   . Aneurysm Father   . Colon cancer Neg Hx     Social History Social History  Substance Use Topics  . Smoking status: Current Every Day Smoker    Packs/day: 0.10    Years: 25.00    Types: Cigarettes  . Smokeless tobacco: Never Used     Comment: 2-3 cigarettes daily  . Alcohol use 0.0 oz/week     Comment: Drinks about 24 oz beer a night; previously a 6-pack a day./ Smokes marjuana daily     Allergies   Tetracyclines & related   Review of Systems Review of Systems  All other systems reviewed and are negative.    Physical Exam Updated Vital Signs BP 101/74   Pulse 92   Temp (!) 100.4 F (38 C) (Rectal)   Resp (!) 25   Ht 5\' 6"  (1.676 m)   Wt 175 lb (79.4 kg)   LMP 06/20/1997 (Approximate) Comment: post menopausal  SpO2 96%   BMI 28.25 kg/m   Physical Exam  Constitutional: She is oriented to person, place, and time. She appears well-developed and well-nourished.  HENT:  Head: Normocephalic and atraumatic.  Eyes: Conjunctivae and EOM are normal. Pupils are equal, round, and reactive to light.  Neck: Normal range of motion and phonation normal. Neck supple.  Cardiovascular: Normal rate and regular rhythm.   Pulmonary/Chest: Effort normal and breath sounds normal. She exhibits no tenderness.  Abdominal: Soft. She exhibits no distension. There is no tenderness. There is no guarding.  Musculoskeletal: Normal range of motion.  Neurological: She is alert and oriented to person, place, and time. She exhibits normal muscle tone.  No dysarthria or aphasia or nystagmus.  Skin: Skin is warm and dry.  Psychiatric: She has a normal mood and affect. Her behavior is normal. Judgment and thought content normal.  Nursing note and vitals reviewed.    ED Treatments / Results  Labs (all  labs ordered are listed, but only abnormal results are displayed) Labs Reviewed  COMPREHENSIVE METABOLIC PANEL - Abnormal; Notable for the following:       Result Value   Sodium 126 (*)    Potassium 2.6 (*)    Chloride 91 (*)    CO2 19 (*)    Glucose, Bld 109 (*)    BUN 23 (*)    Creatinine, Ser 1.04 (*)    Albumin 3.0 (*)    GFR calc non Af Amer 55 (*)    Anion gap 16 (*)    All other components within normal limits  CBC WITH DIFFERENTIAL/PLATELET - Abnormal; Notable for the following:    WBC 14.8 (*)    Hemoglobin 15.8 (*)    MCHC 36.2 (*)  Neutro Abs 10.6 (*)    Monocytes Absolute 2.0 (*)    All other components within normal limits  URINALYSIS, ROUTINE W REFLEX MICROSCOPIC - Abnormal; Notable for the following:    APPearance HAZY (*)    Hgb urine dipstick MODERATE (*)    Ketones, ur 5 (*)    Protein, ur 30 (*)    Leukocytes, UA TRACE (*)    Bacteria, UA FEW (*)    Squamous Epithelial / LPF 0-5 (*)    All other components within normal limits  I-STAT CG4 LACTIC ACID, ED - Abnormal; Notable for the following:    Lactic Acid, Venous 2.27 (*)    All other components within normal limits  CULTURE, BLOOD (ROUTINE X 2)  CULTURE, BLOOD (ROUTINE X 2)  URINE CULTURE  PROTIME-INR  MAGNESIUM  I-STAT CG4 LACTIC ACID, ED    EKG  EKG Interpretation  Date/Time:  Monday June 06 2016 16:24:32 EDT Ventricular Rate:  120 PR Interval:    QRS Duration: 118 QT Interval:  312 QTC Calculation: 441 R Axis:   64 Text Interpretation:  Sinus tachycardia Multiple ventricular premature complexes Consider right atrial enlargement Nonspecific intraventricular conduction delay Baseline wander in lead(s) V6 Since last tracing rate faster Confirmed by Eulis Foster  MD, Endia Moncur [phone]) on 06/06/2016 4:42:30 PM       Radiology Dg Chest 2 View  Result Date: 06/06/2016 CLINICAL DATA:  Generalized weakness and dizziness for 5 days. EXAM: CHEST  2 VIEW COMPARISON:  CT chest 04/28/2017. FINDINGS: The  lungs are clear. No pneumothorax or pleural effusion. Small amount of fat along the periphery of the left lower lobe is unchanged. No pneumothorax. Heart size is normal. Atherosclerosis noted. IMPRESSION: No acute disease. Atherosclerosis. Electronically Signed   By: Inge Rise M.D.   On: 06/06/2016 16:07   Ct Head Wo Contrast  Result Date: 06/06/2016 CLINICAL DATA:  66 year old hypertensive female with generalized weakness, dizziness and headache for 5 days. Syncopal episode last week. Initial encounter. EXAM: CT HEAD WITHOUT CONTRAST TECHNIQUE: Contiguous axial images were obtained from the base of the skull through the vertex without intravenous contrast. COMPARISON:  None. FINDINGS: Brain: No intracranial hemorrhage or CT evidence of large acute infarct. Moderate chronic microvascular changes. Global atrophy without hydrocephalus. No intracranial mass lesion noted on this unenhanced exam. Vascular: Vascular calcifications. Skull: No skull fracture. Sinuses/Orbits: Visualized orbits unremarkable. Partial opacification visualized maxillary sinus, ethmoid sinus air cells and sphenoid sinuses. Other: Negative IMPRESSION: No intracranial hemorrhage or CT evidence of large acute infarct. Moderate chronic microvascular changes. Global atrophy. Partial opacification maxillary sinus, ethmoid sinus air cells and sphenoid sinuses. Electronically Signed   By: Genia Del M.D.   On: 06/06/2016 19:52    Procedures Procedures (including critical care time)  Medications Ordered in ED Medications  sodium chloride 0.9 % bolus 1,000 mL (0 mLs Intravenous Stopped 06/06/16 2042)  sodium chloride 0.9 % bolus 1,000 mL (0 mLs Intravenous Stopped 06/06/16 2242)    And  sodium chloride 0.9 % bolus 1,000 mL (0 mLs Intravenous Stopped 06/06/16 2121)    And  sodium chloride 0.9 % bolus 500 mL (0 mLs Intravenous Stopped 06/06/16 2243)  piperacillin-tazobactam (ZOSYN) IVPB 3.375 g (0 g Intravenous Stopped 06/06/16 1815)    acetaminophen (TYLENOL) tablet 650 mg (650 mg Oral Given 06/06/16 1734)  potassium chloride SA (K-DUR,KLOR-CON) CR tablet 40 mEq (40 mEq Oral Given 06/06/16 1734)  potassium chloride 10 mEq in 100 mL IVPB (0 mEq Intravenous Stopped 06/06/16 2243)  Initial Impression / Assessment and Plan / ED Course  I have reviewed the triage vital signs and the nursing notes.  Pertinent labs & imaging results that were available during my care of the patient were reviewed by me and considered in my medical decision making (see chart for details).  Clinical Course as of Jun 07 156  Mon Jun 06, 2016  1700 Rectal temperature done is consistent with fever  [EW]  1700 Initial lactate elevated, white count elevated, fever present, with initial hypotension, all consistent with sepsis.  Aggressive fluid bolus resuscitation, empiric antibiotic, for possible postsurgical bacteremia, upper airway.  [EW]  1711 low Sodium: (!) 126 [EW]  1711 low  [EW]  1711 low Chloride: (!) 91 [EW]  1711 low CO2: (!) 19 [EW]  1711 BUN: (!) 23 [EW]  1712 low Albumin: (!) 3.0 [EW]  1712 low Anion gap: (!) 16 [EW]  1712 High  WBC: (!) 14.8 [EW]  1712 High  Pulse Rate: (!) 111 [EW]  1920 Patient is ambulatory now and states she feels better, with less dizziness, and ability to walk is better, she still has a headache described as 6/10.  Headache is bifrontal.  I discussed with the patient further evaluation first with CT then lumbar puncture, she agrees to proceed.  Patient's daughter confided to me out of ear shot of the patient, that the patient is an alcoholic, and probably has not had anything to drink for 1 week.  Daughter wonders if the patient's symptoms might be related to alcohol withdrawal.  [EW]    Clinical Course User Index [EW] Daleen Bo, MD    Medications  sodium chloride 0.9 % bolus 1,000 mL (0 mLs Intravenous Stopped 06/06/16 2042)  sodium chloride 0.9 % bolus 1,000 mL (0 mLs Intravenous Stopped 06/06/16 2242)     And  sodium chloride 0.9 % bolus 1,000 mL (0 mLs Intravenous Stopped 06/06/16 2121)    And  sodium chloride 0.9 % bolus 500 mL (0 mLs Intravenous Stopped 06/06/16 2243)  piperacillin-tazobactam (ZOSYN) IVPB 3.375 g (0 g Intravenous Stopped 06/06/16 1815)  acetaminophen (TYLENOL) tablet 650 mg (650 mg Oral Given 06/06/16 1734)  potassium chloride SA (K-DUR,KLOR-CON) CR tablet 40 mEq (40 mEq Oral Given 06/06/16 1734)  potassium chloride 10 mEq in 100 mL IVPB (0 mEq Intravenous Stopped 06/06/16 2243)    Patient Vitals for the past 24 hrs:  BP Temp Temp src Pulse Resp SpO2 Height Weight  06/06/16 2230 101/74 - - 92 (!) 25 96 % - -  06/06/16 2200 109/65 - - 91 17 97 % - -  06/06/16 2145 103/70 - - (!) 103 20 97 % - -  06/06/16 2133 101/65 - - (!) 101 (!) 21 95 % - -  06/06/16 2130 94/65 - - 96 18 96 % - -  06/06/16 2101 115/87 - - 91 16 97 % - -  06/06/16 2046 100/68 - - 94 (!) 21 96 % - -  06/06/16 1900 97/61 - - 96 (!) 22 97 % - -  06/06/16 1830 105/67 - - 100 (!) 22 98 % - -  06/06/16 1800 110/80 - - (!) 109 16 98 % - -  06/06/16 1757 107/67 - - (!) 107 17 100 % - -  06/06/16 1700 103/71 - - (!) 111 (!) 23 99 % - -  06/06/16 1648 - (!) 100.4 F (38 C) Rectal - - - - -  06/06/16 1540 - (!) 95.3 F (35.2 C) Temporal - - - - -  06/06/16 1539 98/69 (!) 95.1 F (35.1 C) Oral (!) 129 20 100 % 5\' 6"  (1.676 m) 175 lb (79.4 kg)    10:25 PM Reevaluation with update and discussion. After initial assessment and treatment, an updated evaluation reveals she is comfortable now.  No meningismus.  Able to stand and walk with good blood pressure.  Currently Romberg negative.  No pronator drift.  Extensive conversation with patient and family member, regarding her presenting complaints and recent history.  Patient currently admitting to drinking 5-6 beers every day, until her recent surgery.  Since the surgery she has had a couple of years, one night, 5 days ago.  She is committed to avoiding alcohol at this time.   She understands that some of her symptoms may be related to alcohol withdrawal.  All findings discussed with patient and family member, and questions were answered. Eryn Marandola L    Final Clinical Impressions(s) / ED Diagnoses   Final diagnoses:  Dizziness  Nonintractable headache, unspecified chronicity pattern, unspecified headache type  Hypokalemia   Patient with signs and symptoms of viral process, subacute, waxing and waning.  Patient improved after treatment with IV fluids and had resolution of headache and dizzy symptoms.  No acute intracranial abnormality.  Mild sinus congestion which could explain both illness, and dizziness.  Doubt meningitis, encephalitis, serious bacterial infection or metabolic instability.  Nursing Notes Reviewed/ Care Coordinated Applicable Imaging Reviewed Interpretation of Laboratory Data incorporated into ED treatment  The patient appears reasonably screened and/or stabilized for discharge and I doubt any other medical condition or other [facility] requiring further screening, evaluation, or treatment in the ED at this time prior to discharge.  Plan: Home Medications-continue usual; Home Treatments-avoid alcohol drink plenty of water; return here if the recommended treatment, does not improve the symptoms; Recommended follow up-PCP 1 week for checkup  New Prescriptions Discharge Medication List as of 06/06/2016 10:25 PM    START taking these medications   Details  potassium chloride SA (K-DUR,KLOR-CON) 20 MEQ tablet Take 1 tablet (20 mEq total) by mouth daily., Starting Mon 06/06/2016, Print         Daleen Bo, MD 06/07/16 [phone]

## 2016-06-06 NOTE — ED Triage Notes (Signed)
Pt also c/o cough/sore throat the other day but not now

## 2016-06-06 NOTE — ED Notes (Signed)
CRITICAL VALUE ALERT  Critical value received:  Potassium 2.6  Date of notification:  06/06/2016  Time of notification:  1649  Critical value read back:  Yes  Nurse who received alert:  Cena Benton  MD notified (1st page):  Eulis Foster  Time of first page:  1650  Responding MD:  Eulis Foster  Time MD responded:  [phone]

## 2016-06-06 NOTE — Discharge Instructions (Signed)
There are several potential causes of your discomfort.  Your blood pressure is somewhat low, which is probably related to not eating and drinking well recently.  It is very good that you are avoiding alcohol, now.  For now you should stop taking 2 of your blood pressure pill, lisinopril/hydrochlorothiazide, and only take one each day.  Continue taking the Norvasc.  It is important to try to increase the potassium in your diet.  Also, take potassium pills as prescribed.  Make sure that you are taking plenty of water each day.  A good goal is 1-2 L of water, each day.  Take Tylenol as needed for fever.

## 2016-06-07 ENCOUNTER — Encounter: Payer: Self-pay | Admitting: Gastroenterology

## 2016-06-09 LAB — URINE CULTURE: Culture: 40000 — AB

## 2016-06-10 ENCOUNTER — Telehealth: Payer: Self-pay

## 2016-06-10 NOTE — Telephone Encounter (Signed)
No treatment needed for ED 06/06/16 UC per Delos Haring PA

## 2016-06-11 LAB — CULTURE, BLOOD (ROUTINE X 2)
CULTURE: NO GROWTH
Culture: NO GROWTH
Special Requests: ADEQUATE
Special Requests: ADEQUATE

## 2016-06-16 ENCOUNTER — Ambulatory Visit (INDEPENDENT_AMBULATORY_CARE_PROVIDER_SITE_OTHER): Payer: Medicare Other | Admitting: Otolaryngology

## 2016-06-16 DIAGNOSIS — J381 Polyp of vocal cord and larynx: Secondary | ICD-10-CM | POA: Diagnosis not present

## 2016-06-16 DIAGNOSIS — F17210 Nicotine dependence, cigarettes, uncomplicated: Secondary | ICD-10-CM

## 2016-06-16 DIAGNOSIS — R490 Dysphonia: Secondary | ICD-10-CM

## 2016-06-29 ENCOUNTER — Other Ambulatory Visit: Payer: Self-pay | Admitting: Gastroenterology

## 2016-06-29 DIAGNOSIS — K219 Gastro-esophageal reflux disease without esophagitis: Secondary | ICD-10-CM

## 2016-07-14 ENCOUNTER — Ambulatory Visit (INDEPENDENT_AMBULATORY_CARE_PROVIDER_SITE_OTHER): Payer: Medicare Other | Admitting: Otolaryngology

## 2016-07-14 DIAGNOSIS — R42 Dizziness and giddiness: Secondary | ICD-10-CM | POA: Diagnosis not present

## 2016-07-14 DIAGNOSIS — J310 Chronic rhinitis: Secondary | ICD-10-CM | POA: Diagnosis not present

## 2016-07-14 DIAGNOSIS — R05 Cough: Secondary | ICD-10-CM | POA: Diagnosis not present

## 2016-07-14 DIAGNOSIS — H903 Sensorineural hearing loss, bilateral: Secondary | ICD-10-CM | POA: Diagnosis not present

## 2016-07-27 ENCOUNTER — Telehealth: Payer: Self-pay | Admitting: Gastroenterology

## 2016-07-27 NOTE — Telephone Encounter (Signed)
Patient did not follow thru with ct adrenal protocol in 05/2016. Can we please ask if patient wants to reschedule that for adrenal masses

## 2016-07-28 NOTE — Progress Notes (Signed)
REVIEWED-NO ADDITIONAL RECOMMENDATIONS. 

## 2016-08-02 NOTE — Telephone Encounter (Signed)
PT would like to reschedule.

## 2016-08-02 NOTE — Telephone Encounter (Signed)
Noted  

## 2016-08-02 NOTE — Telephone Encounter (Signed)
Pt is set up for 08/05/16 @ 6:00 pm. She is aware

## 2016-08-02 NOTE — Telephone Encounter (Signed)
Ok to reschedule

## 2016-08-05 ENCOUNTER — Ambulatory Visit (HOSPITAL_COMMUNITY): Admission: RE | Admit: 2016-08-05 | Payer: Medicare Other | Source: Ambulatory Visit

## 2016-08-18 ENCOUNTER — Ambulatory Visit: Payer: Medicare Other | Admitting: Cardiology

## 2016-09-05 ENCOUNTER — Encounter: Payer: Self-pay | Admitting: Cardiology

## 2016-09-05 ENCOUNTER — Ambulatory Visit (INDEPENDENT_AMBULATORY_CARE_PROVIDER_SITE_OTHER): Payer: Medicare Other | Admitting: Otolaryngology

## 2016-09-05 ENCOUNTER — Encounter: Payer: Self-pay | Admitting: *Deleted

## 2016-09-05 NOTE — Progress Notes (Deleted)
Cardiology Office Note  Date: 09/05/2016   ID: Laura Mcgee, DOB 1950-07-14, MRN [id number]  PCP: Lemmie Evens, MD  Consulting Cardiologist: Rozann Lesches, MD   No chief complaint on file.   History of Present Illness: Laura Mcgee is a 66 y.o. female referred for cardiology consultation by Dr. Karie Kirks for the evaluation of palpitations.  Past Medical History:  Diagnosis Date  . Depression   . Difficult intubation    History of laryngospasm  . GERD (gastroesophageal reflux disease)   . Hyperlipidemia   . Hypertension   . Pulmonary embolism ([place]) 1973    Past Surgical History:  Procedure Laterality Date  . COLONOSCOPY WITH PROPOFOL N/A 04/07/2015   SLF: 1. seven colorectal polyps removed 2. the left colon is redundant 3. small internal hemorrhoids (1 simple adenoma, 6 hyperplastic)   . CYST EXCISION Left    Arm  . ESOPHAGOGASTRODUODENOSCOPY (EGD) WITH PROPOFOL N/A 04/07/2015   SLF: 1. Schatzki ring 2. Bravo capsule 34 cm from the teeth 3. moderate non-erosive gastritis.   Marland Kitchen MASS EXCISION N/A 2002   Lanyx  . MASS EXCISION Left 01/21/2015   Procedure: EXCISION CYST LEFT UPPER ARM;  Surgeon: Aviva Signs, MD;  Location: [facility];  Service: General;  Laterality: Left;  Marland Kitchen MICROLARYNGOSCOPY N/A 05/31/2016   Procedure: MICRO LARYNGOSCOPY WITH BIOPSY OF VOCAL CORD LESION;  Surgeon: Leta Baptist, MD;  Location: [place];  Service: ENT;  Laterality: N/A;  . POLYPECTOMY N/A 04/07/2015   Procedure: POLYPECTOMY;  Surgeon: Danie Binder, MD;  [facility];  Service: Endoscopy;  Laterality: N/A;  Descending colon polyps x 3   . SAVORY DILATION N/A 04/07/2015   Procedure: SAVORY DILATION;  Surgeon: Danie Binder, MD;  [facility];  Service: Endoscopy;  Laterality: N/A;    Current Outpatient Prescriptions  Medication Sig Dispense Refill  . amLODipine (NORVASC) 5 MG tablet Take 5 mg by mouth daily.     . Aspirin-Salicylamide-Caffeine (BC  HEADACHE) 325-95-16 MG TABS Take 1 packet by mouth 2 (two) times daily.    . baclofen (LIORESAL) 10 MG tablet TAKE 1 TABLET BY MOUTH 3 TIMES DAILY BEFORE MEALS. 90 tablet 1  . HYDROcodone-acetaminophen (NORCO) 10-325 MG tablet Take 0.5-1 tablets by mouth every 6 (six) hours as needed.     Marland Kitchen lisinopril-hydrochlorothiazide (PRINZIDE,ZESTORETIC) 20-12.5 MG tablet Take 1 tablet by mouth 2 (two) times daily.     Marland Kitchen lovastatin (MEVACOR) 20 MG tablet Take 20 mg by mouth daily.     Marland Kitchen omeprazole-sodium bicarbonate (ZEGERID) 40-1100 MG capsule Take 1 capsule by mouth daily before breakfast. (Patient not taking: Reported on 06/06/2016) 30 capsule 3  . potassium chloride SA (K-DUR,KLOR-CON) 20 MEQ tablet Take 1 tablet (20 mEq total) by mouth daily. 10 tablet 0  . ranitidine (ZANTAC) 75 MG tablet Take 150 mg by mouth daily as needed for heartburn.     . venlafaxine XR (EFFEXOR-XR) 75 MG 24 hr capsule Take 150 mg by mouth daily with breakfast.      No current facility-administered medications for this visit.    Allergies:  Tetracyclines & related   Social History: The patient  reports that she has been smoking Cigarettes.  She has a 2.50 pack-year smoking history. She has never used smokeless tobacco. She reports that she drinks alcohol. She reports that she uses drugs, including Marijuana.   Family History: The patient's family history includes Aneurysm in her father; Pneumonia in her mother.   ROS:  Please see the history of present illness. Otherwise, complete review of systems is positive for {NONE DEFAULTED:18576::"none"}.  All other systems are reviewed and negative.   Physical Exam: VS:  LMP 06/20/1997 (Approximate) Comment: post menopausal, BMI There is no height or weight on file to calculate BMI.  Wt Readings from Last 3 Encounters:  06/06/16 175 lb (79.4 kg)  05/31/16 177 lb (80.3 kg)  04/27/16 175 lb 9.6 oz (79.7 kg)    General: Patient appears comfortable at rest. HEENT: Conjunctiva and lids  normal, oropharynx clear with moist mucosa. Neck: Supple, no elevated JVP or carotid bruits, no thyromegaly. Lungs: Clear to auscultation, nonlabored breathing at rest. Cardiac: Regular rate and rhythm, no S3 or significant systolic murmur, no pericardial rub. Abdomen: Soft, nontender, no hepatomegaly, bowel sounds present, no guarding or rebound. Extremities: No pitting edema, distal pulses 2+. Skin: Warm and dry. Musculoskeletal: No kyphosis. Neuropsychiatric: Alert and oriented x3, affect grossly appropriate.  ECG: I personally reviewed the tracing from 06/06/2016 which showed sinus tachycardia with PACs.  Recent Labwork: 06/06/2016: ALT 20; AST 20; BUN 23; Creatinine, Ser 1.04; Hemoglobin 15.8; Magnesium 2.1; Platelets 197; Potassium 2.6; Sodium 126   Other Studies Reviewed Today:  Chest CT 04/28/2016: IMPRESSION: 1. There are subtle findings in the lungs which are suggestive of mild or early interstitial lung disease, potentially nonspecific interstitial pneumonia. Repeat high-resolution chest CT is suggested in 6-12 months to assess for temporal changes in the appearance of the lung parenchyma. 2. Mild diffuse bronchial wall thickening with mild paraseptal emphysema; imaging findings suggestive of underlying COPD. 3. 6 mm right lower lobe pulmonary nodule. Non-contrast chest CT at 6-12 months is recommended (preferably performed as a high-resolution chest CT). If the nodule is stable at time of repeat CT, then future CT at 18-24 months (from today's scan) is considered optional for low-risk patients, but is recommended for high-risk patients. This recommendation follows the consensus statement: Guidelines for Management of Incidental Pulmonary Nodules Detected on CT Images: From the Fleischner Society 2017; Radiology 2017; 284:228-243. 4. Large bilateral adrenal masses, as above. Although these are relatively low-attenuation and may simply represent large bilateral adrenal  adenomas, these are incompletely characterized on today's examination. Further evaluation with nonemergent adrenal protocol CT scan is recommended at this time. This recommendation follows ACR consensus guidelines: Management of Incidental Adrenal Masses: A White Paper of the ACR Incidental Findings Committee. J Am Coll Radiol 2017;14:1038-1044.  Assessment and Plan:    Current medicines were reviewed with the patient today.  No orders of the defined types were placed in this encounter.   Disposition:  Signed, Satira Sark, MD, [facility] 09/05/2016 2:49 PM    [facility] [place], [place], [place] [address] Phone: [phone]; Fax: [phone]

## 2016-09-06 ENCOUNTER — Ambulatory Visit: Payer: Medicare Other | Admitting: Cardiology

## 2016-09-06 ENCOUNTER — Encounter: Payer: Self-pay | Admitting: Cardiology

## 2016-10-20 ENCOUNTER — Encounter: Payer: Self-pay | Admitting: Cardiology

## 2016-11-16 ENCOUNTER — Ambulatory Visit: Payer: Medicare Other | Admitting: Orthopaedic Surgery

## 2016-12-15 ENCOUNTER — Ambulatory Visit (INDEPENDENT_AMBULATORY_CARE_PROVIDER_SITE_OTHER): Payer: Medicare Other | Admitting: Otolaryngology

## 2016-12-28 ENCOUNTER — Ambulatory Visit: Payer: Medicare Other | Admitting: Orthopaedic Surgery

## 2017-01-05 ENCOUNTER — Ambulatory Visit (INDEPENDENT_AMBULATORY_CARE_PROVIDER_SITE_OTHER): Payer: Medicare Other | Admitting: Otolaryngology

## 2017-03-10 ENCOUNTER — Other Ambulatory Visit (HOSPITAL_COMMUNITY): Payer: Self-pay | Admitting: Family Medicine

## 2017-03-10 ENCOUNTER — Other Ambulatory Visit: Payer: Self-pay | Admitting: Gastroenterology

## 2017-03-10 DIAGNOSIS — E279 Disorder of adrenal gland, unspecified: Secondary | ICD-10-CM

## 2017-03-10 DIAGNOSIS — J849 Interstitial pulmonary disease, unspecified: Secondary | ICD-10-CM

## 2017-03-10 DIAGNOSIS — R9389 Abnormal findings on diagnostic imaging of other specified body structures: Secondary | ICD-10-CM

## 2017-03-20 ENCOUNTER — Ambulatory Visit (HOSPITAL_COMMUNITY)
Admission: RE | Admit: 2017-03-20 | Discharge: 2017-03-20 | Disposition: A | Discharge status: Home / Self Care | Payer: Medicare Other | Source: Ambulatory Visit | Attending: Family Medicine | Admitting: Family Medicine

## 2017-03-20 ENCOUNTER — Ambulatory Visit (HOSPITAL_COMMUNITY): Payer: Medicare Other

## 2017-03-20 ENCOUNTER — Encounter (HOSPITAL_COMMUNITY): Payer: Self-pay

## 2017-03-28 ENCOUNTER — Ambulatory Visit (HOSPITAL_COMMUNITY): Payer: Medicare Other

## 2017-03-28 ENCOUNTER — Encounter (HOSPITAL_COMMUNITY): Payer: Self-pay

## 2017-06-26 ENCOUNTER — Ambulatory Visit (HOSPITAL_COMMUNITY): Payer: Medicare Other

## 2017-07-11 ENCOUNTER — Ambulatory Visit (HOSPITAL_COMMUNITY): Payer: Medicare Other

## 2017-07-11 ENCOUNTER — Ambulatory Visit (HOSPITAL_COMMUNITY): Admission: RE | Admit: 2017-07-11 | Payer: Medicare Other | Source: Ambulatory Visit

## 2017-07-11 ENCOUNTER — Encounter (HOSPITAL_COMMUNITY): Payer: Self-pay

## 2017-08-28 ENCOUNTER — Ambulatory Visit (HOSPITAL_COMMUNITY): Admission: RE | Admit: 2017-08-28 | Payer: Medicare Other | Source: Ambulatory Visit

## 2017-08-28 ENCOUNTER — Ambulatory Visit (HOSPITAL_COMMUNITY): Payer: Medicare Other

## 2017-09-19 ENCOUNTER — Other Ambulatory Visit (HOSPITAL_COMMUNITY): Payer: Self-pay | Admitting: Family Medicine

## 2017-09-19 DIAGNOSIS — J849 Interstitial pulmonary disease, unspecified: Secondary | ICD-10-CM

## 2017-09-19 DIAGNOSIS — R911 Solitary pulmonary nodule: Secondary | ICD-10-CM

## 2017-09-19 DIAGNOSIS — E279 Disorder of adrenal gland, unspecified: Secondary | ICD-10-CM

## 2017-10-04 ENCOUNTER — Ambulatory Visit (HOSPITAL_COMMUNITY): Payer: Medicare Other

## 2017-10-20 ENCOUNTER — Ambulatory Visit (HOSPITAL_COMMUNITY)
Admission: RE | Admit: 2017-10-20 | Discharge: 2017-10-20 | Disposition: A | Discharge status: Home / Self Care | Payer: Medicare Other | Source: Ambulatory Visit | Attending: Family Medicine | Admitting: Family Medicine

## 2017-10-20 ENCOUNTER — Encounter (HOSPITAL_COMMUNITY): Payer: Self-pay

## 2017-10-20 DIAGNOSIS — R911 Solitary pulmonary nodule: Secondary | ICD-10-CM

## 2017-10-20 DIAGNOSIS — E279 Disorder of adrenal gland, unspecified: Secondary | ICD-10-CM

## 2017-10-20 DIAGNOSIS — J849 Interstitial pulmonary disease, unspecified: Secondary | ICD-10-CM

## 2017-10-24 ENCOUNTER — Other Ambulatory Visit (HOSPITAL_COMMUNITY): Payer: Self-pay | Admitting: Nurse Practitioner

## 2017-10-24 DIAGNOSIS — R42 Dizziness and giddiness: Secondary | ICD-10-CM

## 2017-10-27 ENCOUNTER — Ambulatory Visit (HOSPITAL_COMMUNITY)
Admission: RE | Admit: 2017-10-27 | Discharge: 2017-10-27 | Disposition: A | Discharge status: Home / Self Care | Payer: Medicare Other | Source: Ambulatory Visit | Attending: Family Medicine | Admitting: Family Medicine

## 2017-10-27 DIAGNOSIS — R002 Palpitations: Secondary | ICD-10-CM | POA: Diagnosis not present

## 2017-10-27 DIAGNOSIS — R42 Dizziness and giddiness: Secondary | ICD-10-CM | POA: Diagnosis present

## 2017-10-27 DIAGNOSIS — K219 Gastro-esophageal reflux disease without esophagitis: Secondary | ICD-10-CM | POA: Diagnosis not present

## 2017-10-27 NOTE — Progress Notes (Signed)
*  PRELIMINARY RESULTS* Echocardiogram 2D Echocardiogram has been performed.  Laura Mcgee 10/27/2017, 3:05 PM

## 2017-11-09 ENCOUNTER — Ambulatory Visit (HOSPITAL_COMMUNITY): Payer: Medicare Other

## 2017-11-24 ENCOUNTER — Other Ambulatory Visit: Payer: Self-pay | Admitting: Family Medicine

## 2017-11-28 ENCOUNTER — Ambulatory Visit (HOSPITAL_COMMUNITY): Admission: RE | Admit: 2017-11-28 | Payer: Medicare Other | Source: Ambulatory Visit

## 2017-11-28 ENCOUNTER — Ambulatory Visit (HOSPITAL_COMMUNITY): Payer: Medicare Other

## 2017-12-01 ENCOUNTER — Other Ambulatory Visit (HOSPITAL_COMMUNITY): Payer: Self-pay | Admitting: Family Medicine

## 2017-12-01 DIAGNOSIS — R1011 Right upper quadrant pain: Secondary | ICD-10-CM

## 2017-12-06 ENCOUNTER — Encounter (HOSPITAL_COMMUNITY): Payer: Self-pay

## 2017-12-06 ENCOUNTER — Ambulatory Visit (HOSPITAL_COMMUNITY)
Admission: RE | Admit: 2017-12-06 | Discharge: 2017-12-06 | Disposition: A | Discharge status: Home / Self Care | Payer: Medicare Other | Source: Ambulatory Visit | Attending: Family Medicine | Admitting: Family Medicine

## 2017-12-15 ENCOUNTER — Ambulatory Visit (HOSPITAL_COMMUNITY): Admission: RE | Admit: 2017-12-15 | Payer: Medicare Other | Source: Ambulatory Visit

## 2017-12-20 ENCOUNTER — Ambulatory Visit (HOSPITAL_COMMUNITY): Payer: Medicare Other

## 2017-12-21 ENCOUNTER — Ambulatory Visit: Payer: Medicare Other | Admitting: Cardiology

## 2017-12-21 NOTE — Progress Notes (Deleted)
Clinical Summary Ms. Binette is a 67 y.o.female seen as new consult  1. Palpitations -   10/2017 echo: LVEF 82-95%, grade I diastolic dysfunction Past Medical History:  Diagnosis Date  . Depression   . Difficult intubation    History of laryngospasm  . GERD (gastroesophageal reflux disease)   . Hyperlipidemia   . Hypertension   . Pulmonary embolism ([place]) 1973     Allergies  Allergen Reactions  . Tetracyclines & Related Other (See Comments)    Makes her sick on her stomach      Current Outpatient Medications  Medication Sig Dispense Refill  . amLODipine (NORVASC) 5 MG tablet Take 5 mg by mouth daily.     . Aspirin-Salicylamide-Caffeine (BC HEADACHE) 325-95-16 MG TABS Take 1 packet by mouth 2 (two) times daily.    . baclofen (LIORESAL) 10 MG tablet TAKE 1 TABLET BY MOUTH 3 TIMES DAILY BEFORE MEALS. 90 tablet 1  . HYDROcodone-acetaminophen (NORCO) 10-325 MG tablet Take 0.5-1 tablets by mouth every 6 (six) hours as needed.     Marland Kitchen lisinopril-hydrochlorothiazide (PRINZIDE,ZESTORETIC) 20-12.5 MG tablet Take 1 tablet by mouth 2 (two) times daily.     Marland Kitchen lovastatin (MEVACOR) 20 MG tablet Take 20 mg by mouth daily.     Marland Kitchen omeprazole-sodium bicarbonate (ZEGERID) 40-1100 MG capsule Take 1 capsule by mouth daily before breakfast. (Patient not taking: Reported on 06/06/2016) 30 capsule 3  . potassium chloride SA (K-DUR,KLOR-CON) 20 MEQ tablet Take 1 tablet (20 mEq total) by mouth daily. 10 tablet 0  . ranitidine (ZANTAC) 75 MG tablet Take 150 mg by mouth daily as needed for heartburn.     . venlafaxine XR (EFFEXOR-XR) 75 MG 24 hr capsule Take 150 mg by mouth daily with breakfast.      No current facility-administered medications for this visit.      Past Surgical History:  Procedure Laterality Date  . COLONOSCOPY WITH PROPOFOL N/A 04/07/2015   SLF: 1. seven colorectal polyps removed 2. the left colon is redundant 3. small internal hemorrhoids (1 simple adenoma, 6 hyperplastic)   .  CYST EXCISION Left    Arm  . ESOPHAGOGASTRODUODENOSCOPY (EGD) WITH PROPOFOL N/A 04/07/2015   SLF: 1. Schatzki ring 2. Bravo capsule 34 cm from the teeth 3. moderate non-erosive gastritis.   Marland Kitchen MASS EXCISION N/A 2002   Lanyx  . MASS EXCISION Left 01/21/2015   Procedure: EXCISION CYST LEFT UPPER ARM;  Surgeon: Aviva Signs, MD;  [facility]: [facility];  Service: General;  Laterality: Left;  Marland Kitchen MICROLARYNGOSCOPY N/A 05/31/2016   Procedure: MICRO LARYNGOSCOPY WITH BIOPSY OF VOCAL CORD LESION;  Surgeon: Leta Baptist, MD;  Location: [facility];  Service: ENT;  Laterality: N/A;  . POLYPECTOMY N/A 04/07/2015   Procedure: POLYPECTOMY;  Surgeon: Danie Binder, MD;  [facility];  Service: Endoscopy;  Laterality: N/A;  Descending colon polyps x 3   . SAVORY DILATION N/A 04/07/2015   Procedure: SAVORY DILATION;  Surgeon: Danie Binder, MD;  [facility];  Service: Endoscopy;  Laterality: N/A;     Allergies  Allergen Reactions  . Tetracyclines & Related Other (See Comments)    Makes her sick on her stomach       Family History  Problem Relation Age of Onset  . Pneumonia Mother   . Aneurysm Father   . Colon cancer Neg Hx      Social History Ms. Montas reports that she has been smoking cigarettes. She has  a 2.50 pack-year smoking history. She has never used smokeless tobacco. Ms. Mcnamara reports that she drinks alcohol.   Review of Systems CONSTITUTIONAL: No weight loss, fever, chills, weakness or fatigue.  HEENT: Eyes: No visual loss, blurred vision, double vision or yellow sclerae.No hearing loss, sneezing, congestion, runny nose or sore throat.  SKIN: No rash or itching.  CARDIOVASCULAR:  RESPIRATORY: No shortness of breath, cough or sputum.  GASTROINTESTINAL: No anorexia, nausea, vomiting or diarrhea. No abdominal pain or blood.  GENITOURINARY: No burning on urination, no polyuria NEUROLOGICAL: No headache, dizziness, syncope, paralysis, ataxia, numbness or  tingling in the extremities. No change in bowel or bladder control.  MUSCULOSKELETAL: No muscle, back pain, joint pain or stiffness.  LYMPHATICS: No enlarged nodes. No history of splenectomy.  PSYCHIATRIC: No history of depression or anxiety.  ENDOCRINOLOGIC: No reports of sweating, cold or heat intolerance. No polyuria or polydipsia.  Marland Kitchen   Physical Examination There were no vitals filed for this visit. There were no vitals filed for this visit.  Gen: resting comfortably, no acute distress HEENT: no scleral icterus, pupils equal round and reactive, no palptable cervical adenopathy,  CV Resp: Clear to auscultation bilaterally GI: abdomen is soft, non-tender, non-distended, normal bowel sounds, no hepatosplenomegaly MSK: extremities are warm, no edema.  Skin: warm, no rash Neuro:  no focal deficits Psych: appropriate affect   Diagnostic Studies 10/2017 echo   Study Conclusions  - Left ventricle: The cavity size was normal. Wall thickness was   increased in a pattern of mild LVH. Systolic function was normal.   The estimated ejection fraction was in the range of 60% to 65%.   Doppler parameters are consistent with abnormal left ventricular   relaxation (grade 1 diastolic dysfunction). - Aortic valve: Valve area (VTI): 3.11 cm^2. Valve area (Vmax):   2.89 cm^2. Valve area (Vmean): 2.47 cm^2. - Atrial septum: No defect or patent foramen ovale was identified. - Technically difficult study.Assessment and Plan        Arnoldo Lenis, M.D., F.A.C.C.

## 2018-01-16 ENCOUNTER — Ambulatory Visit (HOSPITAL_COMMUNITY): Admission: RE | Admit: 2018-01-16 | Payer: Medicare Other | Source: Ambulatory Visit

## 2018-01-25 NOTE — Progress Notes (Deleted)
Cardiology Office Note   Date:  01/25/2018   ID:  Aleiyah Halpin, DOB [birth year], MRN [id number]  PCP:  Lemmie Evens, MD  Cardiologist:   Jenkins Rouge, MD   No chief complaint on file.     History of Present Illness: Laura Mcgee is a 67 y.o. female who presents for consultation regarding palpitations and dizziness.  Referred by Dr Karie Kirks. She smokes 2 ppd, HTN, Depression, GERD and 4-5 alcoholic drinks / night   TTE: 10/27/17 EF 60-65% no valve disease normal PA estimate   Dizziness for months Can last most of the day Can be positional with standing Associated with " not clear Headed feeling"  BP meds have been adjusted and not too low. Seen by ENT with no etiology given Labs  Ok did not have TSH / cortisol results back She has rare palpitations  Needs f/u CT for ? Adrenal mass   ***   Past Medical History:  Diagnosis Date  . Depression   . Difficult intubation    History of laryngospasm  . GERD (gastroesophageal reflux disease)   . Hyperlipidemia   . Hypertension   . Pulmonary embolism ([place]) 1973    Past Surgical History:  Procedure Laterality Date  . COLONOSCOPY WITH PROPOFOL N/A 04/07/2015   SLF: 1. seven colorectal polyps removed 2. the left colon is redundant 3. small internal hemorrhoids (1 simple adenoma, 6 hyperplastic)   . CYST EXCISION Left    Arm  . ESOPHAGOGASTRODUODENOSCOPY (EGD) WITH PROPOFOL N/A 04/07/2015   SLF: 1. Schatzki ring 2. Bravo capsule 34 cm from the teeth 3. moderate non-erosive gastritis.   Marland Kitchen MASS EXCISION N/A 2002   Lanyx  . MASS EXCISION Left 01/21/2015   Procedure: EXCISION CYST LEFT UPPER ARM;  Surgeon: Aviva Signs, MD;  [facility];  Service: General;  Laterality: Left;  Marland Kitchen MICROLARYNGOSCOPY N/A 05/31/2016   Procedure: MICRO LARYNGOSCOPY WITH BIOPSY OF VOCAL CORD LESION;  Surgeon: Leta Baptist, MD;  Location: [place];  Service: ENT;  Laterality: N/A;  . POLYPECTOMY N/A 04/07/2015   Procedure:  POLYPECTOMY;  Surgeon: Danie Binder, MD;  [facility];  Service: Endoscopy;  Laterality: N/A;  Descending colon polyps x 3   . SAVORY DILATION N/A 04/07/2015   Procedure: SAVORY DILATION;  Surgeon: Danie Binder, MD;  [facility];  Service: Endoscopy;  Laterality: N/A;     Current Outpatient Medications  Medication Sig Dispense Refill  . amLODipine (NORVASC) 5 MG tablet Take 5 mg by mouth daily.     . Aspirin-Salicylamide-Caffeine (BC HEADACHE) 325-95-16 MG TABS Take 1 packet by mouth 2 (two) times daily.    . baclofen (LIORESAL) 10 MG tablet TAKE 1 TABLET BY MOUTH 3 TIMES DAILY BEFORE MEALS. 90 tablet 1  . HYDROcodone-acetaminophen (NORCO) 10-325 MG tablet Take 0.5-1 tablets by mouth every 6 (six) hours as needed.     Marland Kitchen lisinopril-hydrochlorothiazide (PRINZIDE,ZESTORETIC) 20-12.5 MG tablet Take 1 tablet by mouth 2 (two) times daily.     Marland Kitchen lovastatin (MEVACOR) 20 MG tablet Take 20 mg by mouth daily.     Marland Kitchen omeprazole-sodium bicarbonate (ZEGERID) 40-1100 MG capsule Take 1 capsule by mouth daily before breakfast. (Patient not taking: Reported on 06/06/2016) 30 capsule 3  . potassium chloride SA (K-DUR,KLOR-CON) 20 MEQ tablet Take 1 tablet (20 mEq total) by mouth daily. 10 tablet 0  . ranitidine (ZANTAC) 75 MG tablet Take 150 mg by mouth daily as needed for heartburn.     Marland Kitchen  venlafaxine XR (EFFEXOR-XR) 75 MG 24 hr capsule Take 150 mg by mouth daily with breakfast.      No current facility-administered medications for this visit.     Allergies:   Tetracyclines & related    Social History:  The patient  reports that she has been smoking cigarettes. She has a 2.50 pack-year smoking history. She has never used smokeless tobacco. She reports that she drinks alcohol. She reports that she has current or past drug history. Drug: Marijuana.   Family History:  The patient's family history includes Aneurysm in her father; Pneumonia in her mother.    ROS:  Please see the history  of present illness.   Otherwise, review of systems are positive for none.   All other systems are reviewed and negative.    PHYSICAL EXAM: VS:  LMP 06/20/1997 (Approximate) Comment: post menopausal , BMI There is no height or weight on file to calculate BMI. Affect appropriate Healthy:  appears stated age 45: normal Neck supple with no adenopathy JVP normal no bruits no thyromegaly Lungs clear with no wheezing and good diaphragmatic motion Heart:  S1/S2 no murmur, no rub, gallop or click PMI normal Abdomen: benighn, BS positve, no tenderness, no AAA no bruit.  No HSM or HJR Distal pulses intact with no bruits No edema Neuro non-focal Skin warm and dry No muscular weakness    EKG:  ST rate 120 otherwise normal 06/07/16   Recent Labs: No results found for requested labs within last 8760 hours.    Lipid Panel No results found for: CHOL, TRIG, HDL, CHOLHDL, VLDL, LDLCALC, LDLDIRECT    Wt Readings from Last 3 Encounters:  06/06/16 175 lb (79.4 kg)  05/31/16 177 lb (80.3 kg)  04/27/16 175 lb 9.6 oz (79.7 kg)      Other studies Reviewed: Additional studies/ records that were reviewed today include: Notes from ER, labs ECg Notes from primary .    ASSESSMENT AND PLAN:  1.  Palpitations:  *** 2.  Dizziness:  *** 3. Smoking:  Counseled on cessation for less than 10 minutes CXR 06/06/16 NAD Needs updated lung cancer screening CT  4. ETOH:  ***   Current medicines are reviewed at length with the patient today.  The patient does not have concerns regarding medicines.  The following changes have been made:  no change  Labs/ tests ordered today include: *** No orders of the defined types were placed in this encounter.    Disposition:   FU with cardiology PRN      Signed, Jenkins Rouge, MD  01/25/2018 3:04 PM    [facility] [place], [place], [place]  [address] Phone: [phone]; Fax: [phone]

## 2018-01-29 ENCOUNTER — Ambulatory Visit: Payer: Medicare Other | Admitting: Cardiovascular Disease

## 2018-01-30 ENCOUNTER — Encounter: Payer: Self-pay | Admitting: Cardiovascular Disease

## 2018-02-09 ENCOUNTER — Ambulatory Visit (HOSPITAL_COMMUNITY)
Admission: RE | Admit: 2018-02-09 | Discharge: 2018-02-09 | Disposition: A | Discharge status: Home / Self Care | Payer: Medicare Other | Source: Ambulatory Visit | Attending: Family Medicine | Admitting: Family Medicine

## 2018-02-09 DIAGNOSIS — R1011 Right upper quadrant pain: Secondary | ICD-10-CM | POA: Diagnosis present

## 2018-02-09 DIAGNOSIS — K760 Fatty (change of) liver, not elsewhere classified: Secondary | ICD-10-CM | POA: Diagnosis not present

## 2018-02-26 ENCOUNTER — Ambulatory Visit (INDEPENDENT_AMBULATORY_CARE_PROVIDER_SITE_OTHER): Payer: Medicare Other | Admitting: Otolaryngology

## 2018-03-12 ENCOUNTER — Other Ambulatory Visit (HOSPITAL_COMMUNITY): Payer: Self-pay | Admitting: Family Medicine

## 2018-03-12 ENCOUNTER — Other Ambulatory Visit: Payer: Self-pay | Admitting: Family Medicine

## 2018-03-12 ENCOUNTER — Ambulatory Visit (INDEPENDENT_AMBULATORY_CARE_PROVIDER_SITE_OTHER): Payer: Medicare Other | Admitting: Otolaryngology

## 2018-03-12 DIAGNOSIS — R109 Unspecified abdominal pain: Secondary | ICD-10-CM

## 2018-03-12 DIAGNOSIS — H6122 Impacted cerumen, left ear: Secondary | ICD-10-CM

## 2018-03-12 DIAGNOSIS — R42 Dizziness and giddiness: Secondary | ICD-10-CM

## 2018-03-12 DIAGNOSIS — J320 Chronic maxillary sinusitis: Secondary | ICD-10-CM | POA: Diagnosis not present

## 2018-03-12 DIAGNOSIS — H903 Sensorineural hearing loss, bilateral: Secondary | ICD-10-CM | POA: Diagnosis not present

## 2018-03-12 DIAGNOSIS — J322 Chronic ethmoidal sinusitis: Secondary | ICD-10-CM | POA: Diagnosis not present

## 2018-03-13 ENCOUNTER — Other Ambulatory Visit (HOSPITAL_COMMUNITY): Payer: Self-pay | Admitting: Otolaryngology

## 2018-03-13 ENCOUNTER — Other Ambulatory Visit: Payer: Self-pay | Admitting: Otolaryngology

## 2018-03-13 DIAGNOSIS — J320 Chronic maxillary sinusitis: Secondary | ICD-10-CM

## 2018-03-20 NOTE — Progress Notes (Deleted)
Cardiology Office Note   Date:  03/20/2018   ID:  Laura Mcgee, DOB 08-29-1950, MRN [id number]  PCP:  Lemmie Evens, MD  Cardiologist:   Jenkins Rouge, MD   No chief complaint on file.     History of Present Illness: Laura Mcgee is a 68 y.o. female who presents for consultation regarding palpitations and dizziness. Referred By Dr Karie Kirks Chronic issue seen in ER 06/2016 with dizziness and fatigue Hydrated with negative w/u. History  Of HLD, and HTN  Has GERD and chronic cough with previous laryngeal surgery She still smokes and has COPD Followed by Dr Luan Pulling CT in 2018 suggested early ILD and 6 mm RLL nodule with likely adrenal adenoma's   Reviewed TTE done 10/27/17 essentially normal with EF 60-65% mild LVH no significant valve disease   ***    Past Medical History:  Diagnosis Date  . Depression   . Difficult intubation    History of laryngospasm  . GERD (gastroesophageal reflux disease)   . Hyperlipidemia   . Hypertension   . Pulmonary embolism ([place]) 1973    Past Surgical History:  Procedure Laterality Date  . COLONOSCOPY WITH PROPOFOL N/A 04/07/2015   SLF: 1. seven colorectal polyps removed 2. the left colon is redundant 3. small internal hemorrhoids (1 simple adenoma, 6 hyperplastic)   . CYST EXCISION Left    Arm  . ESOPHAGOGASTRODUODENOSCOPY (EGD) WITH PROPOFOL N/A 04/07/2015   SLF: 1. Schatzki ring 2. Bravo capsule 34 cm from the teeth 3. moderate non-erosive gastritis.   Marland Kitchen MASS EXCISION N/A 2002   Lanyx  . MASS EXCISION Left 01/21/2015   Procedure: EXCISION CYST LEFT UPPER ARM;  Surgeon: Aviva Signs, MD;  Location: [facility];  Service: General;  Laterality: Left;  Marland Kitchen MICROLARYNGOSCOPY N/A 05/31/2016   Procedure: MICRO LARYNGOSCOPY WITH BIOPSY OF VOCAL CORD LESION;  Surgeon: Leta Baptist, MD;  Location: [facility];  Service: ENT;  Laterality: N/A;  . POLYPECTOMY N/A 04/07/2015   Procedure: POLYPECTOMY;  Surgeon: Danie Binder, MD;  [facility];  Service: Endoscopy;  Laterality: N/A;  Descending colon polyps x 3   . SAVORY DILATION N/A 04/07/2015   Procedure: SAVORY DILATION;  Surgeon: Danie Binder, MD;  [facility];  Service: Endoscopy;  Laterality: N/A;     Current Outpatient Medications  Medication Sig Dispense Refill  . amLODipine (NORVASC) 5 MG tablet Take 5 mg by mouth daily.     . Aspirin-Salicylamide-Caffeine (BC HEADACHE) 325-95-16 MG TABS Take 1 packet by mouth 2 (two) times daily.    . baclofen (LIORESAL) 10 MG tablet TAKE 1 TABLET BY MOUTH 3 TIMES DAILY BEFORE MEALS. 90 tablet 1  . HYDROcodone-acetaminophen (NORCO) 10-325 MG tablet Take 0.5-1 tablets by mouth every 6 (six) hours as needed.     Marland Kitchen lisinopril-hydrochlorothiazide (PRINZIDE,ZESTORETIC) 20-12.5 MG tablet Take 1 tablet by mouth 2 (two) times daily.     Marland Kitchen lovastatin (MEVACOR) 20 MG tablet Take 20 mg by mouth daily.     Marland Kitchen omeprazole-sodium bicarbonate (ZEGERID) 40-1100 MG capsule Take 1 capsule by mouth daily before breakfast. (Patient not taking: Reported on 06/06/2016) 30 capsule 3  . potassium chloride SA (K-DUR,KLOR-CON) 20 MEQ tablet Take 1 tablet (20 mEq total) by mouth daily. 10 tablet 0  . ranitidine (ZANTAC) 75 MG tablet Take 150 mg by mouth daily as needed for heartburn.     . venlafaxine XR (EFFEXOR-XR) 75 MG 24 hr capsule Take 150 mg by mouth  daily with breakfast.      No current facility-administered medications for this visit.     Allergies:   Tetracyclines & related    Social History:  The patient  reports that she has been smoking cigarettes. She has a 2.50 pack-year smoking history. She has never used smokeless tobacco. She reports current alcohol use. She reports current drug use. Drug: Marijuana.   Family History:  The patient's family history includes Aneurysm in her father; Pneumonia in her mother.    ROS:  Please see the history of present illness.   Otherwise, review of systems are positive for none.   All other  systems are reviewed and negative.    PHYSICAL EXAM: VS:  LMP 06/20/1997 (Approximate) Comment: post menopausal , BMI There is no height or weight on file to calculate BMI. Affect appropriate Healthy:  appears stated age 33: normal Neck supple with no adenopathy JVP normal no bruits no thyromegaly Lungs clear with no wheezing and good diaphragmatic motion Heart:  S1/S2 no murmur, no rub, gallop or click PMI normal Abdomen: benighn, BS positve, no tenderness, no AAA no bruit.  No HSM or HJR Distal pulses intact with no bruits No edema Neuro non-focal Skin warm and dry No muscular weakness    EKG:  06/07/16 ST rate 120 otherwise normal    Recent Labs: No results found for requested labs within last 8760 hours.    Lipid Panel No results found for: CHOL, TRIG, HDL, CHOLHDL, VLDL, LDLCALC, LDLDIRECT    Wt Readings from Last 3 Encounters:  06/06/16 175 lb (79.4 kg)  05/31/16 177 lb (80.3 kg)  04/27/16 175 lb 9.6 oz (79.7 kg)      Other studies Reviewed: Additional studies/ records that were reviewed today include: Notes from primary , ENT notes from ER with ECG, labs And chest CT .    ASSESSMENT AND PLAN:  1.  Dizziness: *** 2. Palpitations: *** 3. HTN:  *** 4. HLD:  Continue statin labs with primary  5. COPD:  F/u Dr Luan Pulling encouraged smoking cessation should have f/u lung cancer screening CT given previous nodule ***   Current medicines are reviewed at length with the patient today.  The patient does not have concerns regarding medicines.  The following changes have been made:  no change  Labs/ tests ordered today include: *** No orders of the defined types were placed in this encounter.    Disposition:   FU with cardiology PRN      Signed, Jenkins Rouge, MD  03/20/2018 6:33 PM    [address], [place], [place]  [address] Phone: [phone]; Fax: [phone]

## 2018-03-23 ENCOUNTER — Ambulatory Visit (HOSPITAL_COMMUNITY): Payer: Medicare Other

## 2018-03-23 ENCOUNTER — Encounter (HOSPITAL_COMMUNITY): Payer: Self-pay

## 2018-03-26 ENCOUNTER — Ambulatory Visit: Payer: Medicare Other | Admitting: Cardiovascular Disease

## 2018-04-09 ENCOUNTER — Ambulatory Visit (INDEPENDENT_AMBULATORY_CARE_PROVIDER_SITE_OTHER): Payer: Medicare Other | Admitting: Otolaryngology

## 2018-04-12 ENCOUNTER — Encounter (HOSPITAL_COMMUNITY): Payer: Self-pay

## 2018-04-12 ENCOUNTER — Ambulatory Visit (HOSPITAL_COMMUNITY): Payer: Medicare Other

## 2018-05-10 ENCOUNTER — Ambulatory Visit (HOSPITAL_COMMUNITY): Payer: Medicare Other

## 2018-05-10 ENCOUNTER — Ambulatory Visit (HOSPITAL_COMMUNITY): Admission: RE | Admit: 2018-05-10 | Payer: Medicare Other | Source: Ambulatory Visit

## 2018-05-28 ENCOUNTER — Ambulatory Visit (HOSPITAL_COMMUNITY): Payer: Medicare Other

## 2018-05-28 ENCOUNTER — Other Ambulatory Visit (HOSPITAL_COMMUNITY): Payer: Medicare Other

## 2018-06-06 LAB — LIPID PANEL
Cholesterol: 186 (ref 0–200)
HDL: 54 (ref 35–70)
LDL Cholesterol: 100
Triglycerides: 100 (ref 40–160)

## 2018-06-06 LAB — CBC AND DIFFERENTIAL: HCT: 46 (ref 36–46)

## 2018-06-06 LAB — BASIC METABOLIC PANEL
Potassium: 4.3 (ref 3.4–5.3)
Sodium: 139 (ref 137–147)

## 2018-06-06 LAB — HEMOGLOBIN A1C: Hemoglobin A1C: 5.4

## 2018-06-22 ENCOUNTER — Other Ambulatory Visit (HOSPITAL_COMMUNITY): Payer: Self-pay | Admitting: Family Medicine

## 2018-06-22 ENCOUNTER — Other Ambulatory Visit: Payer: Self-pay | Admitting: Family Medicine

## 2018-06-22 DIAGNOSIS — R200 Anesthesia of skin: Secondary | ICD-10-CM

## 2018-06-28 ENCOUNTER — Ambulatory Visit (HOSPITAL_COMMUNITY): Payer: Medicare Other

## 2018-06-28 ENCOUNTER — Encounter: Payer: Self-pay | Admitting: "Endocrinology

## 2018-06-28 ENCOUNTER — Other Ambulatory Visit: Payer: Self-pay

## 2018-06-28 ENCOUNTER — Ambulatory Visit (HOSPITAL_COMMUNITY)
Admission: RE | Admit: 2018-06-28 | Discharge: 2018-06-28 | Disposition: A | Discharge status: Home / Self Care | Payer: Medicare Other | Source: Ambulatory Visit | Attending: Family Medicine | Admitting: Family Medicine

## 2018-06-28 ENCOUNTER — Ambulatory Visit (HOSPITAL_COMMUNITY)
Admission: RE | Admit: 2018-06-28 | Discharge: 2018-06-28 | Disposition: A | Discharge status: Home / Self Care | Payer: Medicare Other | Source: Ambulatory Visit | Attending: Otolaryngology | Admitting: Otolaryngology

## 2018-06-28 DIAGNOSIS — J320 Chronic maxillary sinusitis: Secondary | ICD-10-CM

## 2018-06-28 DIAGNOSIS — R42 Dizziness and giddiness: Secondary | ICD-10-CM | POA: Diagnosis not present

## 2018-07-06 ENCOUNTER — Other Ambulatory Visit: Payer: Self-pay | Admitting: Family Medicine

## 2018-07-06 ENCOUNTER — Other Ambulatory Visit (HOSPITAL_COMMUNITY): Payer: Self-pay | Admitting: Family Medicine

## 2018-07-06 DIAGNOSIS — R109 Unspecified abdominal pain: Secondary | ICD-10-CM

## 2018-07-06 DIAGNOSIS — K580 Irritable bowel syndrome with diarrhea: Secondary | ICD-10-CM

## 2018-07-09 ENCOUNTER — Ambulatory Visit (HOSPITAL_COMMUNITY)
Admission: RE | Admit: 2018-07-09 | Discharge: 2018-07-09 | Disposition: A | Discharge status: Home / Self Care | Payer: Medicare Other | Source: Ambulatory Visit | Attending: Family Medicine | Admitting: Family Medicine

## 2018-07-09 ENCOUNTER — Other Ambulatory Visit: Payer: Self-pay

## 2018-07-09 DIAGNOSIS — R109 Unspecified abdominal pain: Secondary | ICD-10-CM | POA: Insufficient documentation

## 2018-07-09 DIAGNOSIS — K580 Irritable bowel syndrome with diarrhea: Secondary | ICD-10-CM | POA: Insufficient documentation

## 2018-07-09 MED ORDER — IOHEXOL 300 MG/ML  SOLN
100.0000 mL | Freq: Once | INTRAMUSCULAR | Status: AC | PRN
  Administered 2018-07-09: 100 mL via INTRAVENOUS

## 2018-07-09 MED ORDER — IOHEXOL 300 MG/ML  SOLN: 100.0000 mL | Freq: Once | INTRAMUSCULAR | Status: DC | PRN

## 2018-08-08 ENCOUNTER — Encounter (HOSPITAL_COMMUNITY): Payer: Self-pay

## 2018-08-08 ENCOUNTER — Ambulatory Visit (HOSPITAL_COMMUNITY): Admission: RE | Admit: 2018-08-08 | Payer: Medicare Other | Source: Ambulatory Visit

## 2018-08-20 ENCOUNTER — Encounter: Payer: Self-pay | Admitting: "Endocrinology

## 2018-08-20 ENCOUNTER — Ambulatory Visit (INDEPENDENT_AMBULATORY_CARE_PROVIDER_SITE_OTHER): Payer: Medicare Other | Admitting: "Endocrinology

## 2018-08-20 ENCOUNTER — Other Ambulatory Visit: Payer: Self-pay

## 2018-08-20 VITALS — BP 141/91 | HR 95 | Temp 97.2°F | Ht 66.0 in | Wt 184.4 lb

## 2018-08-20 DIAGNOSIS — E278 Other specified disorders of adrenal gland: Secondary | ICD-10-CM

## 2018-08-20 NOTE — Progress Notes (Signed)
Endocrinology Consult Note                                            08/20/2018, 4:49 PM   Subjective:    Patient ID: Laura Mcgee, female    DOB: 1950-06-07, PCP Lemmie Evens, MD   Past Medical History:  Diagnosis Date  . Depression   . Difficult intubation    History of laryngospasm  . GERD (gastroesophageal reflux disease)   . Hyperlipidemia   . Hypertension   . Pulmonary embolism ([place]) 1973   Past Surgical History:  Procedure Laterality Date  . COLONOSCOPY WITH PROPOFOL N/A 04/07/2015   SLF: 1. seven colorectal polyps removed 2. the left colon is redundant 3. small internal hemorrhoids (1 simple adenoma, 6 hyperplastic)   . CYST EXCISION Left    Arm  . ESOPHAGOGASTRODUODENOSCOPY (EGD) WITH PROPOFOL N/A 04/07/2015   SLF: 1. Schatzki ring 2. Bravo capsule 34 cm from the teeth 3. moderate non-erosive gastritis.   Marland Kitchen MASS EXCISION N/A 2002   Lanyx  . MASS EXCISION Left 01/21/2015   Procedure: EXCISION CYST LEFT UPPER ARM;  Surgeon: Aviva Signs, MD;  Location: [facility];  Service: General;  Laterality: Left;  Marland Kitchen MICROLARYNGOSCOPY N/A 05/31/2016   Procedure: MICRO LARYNGOSCOPY WITH BIOPSY OF VOCAL CORD LESION;  Surgeon: Leta Baptist, MD;  Location: [facility];  Service: ENT;  Laterality: N/A;  . POLYPECTOMY N/A 04/07/2015   Procedure: POLYPECTOMY;  Surgeon: Danie Binder, MD;  [facility];  Service: Endoscopy;  Laterality: N/A;  Descending colon polyps x 3   . SAVORY DILATION N/A 04/07/2015   Procedure: SAVORY DILATION;  Surgeon: Danie Binder, MD;  [facility];  Service: Endoscopy;  Laterality: N/A;   Social History   Socioeconomic History  . Marital status: Single    Spouse name: Not on file  . Number of children: Not on file  . Years of education: Not on file  . Highest education level: Not on file  Occupational History  . Not on file  Social Needs  . Financial resource strain: Not on file  . Food insecurity    Worry: Not  on file    Inability: Not on file  . Transportation needs    Medical: Not on file    Non-medical: Not on file  Tobacco Use  . Smoking status: Current Every Day Smoker    Packs/day: 0.10    Years: 25.00    Pack years: 2.50    Types: Cigarettes  . Smokeless tobacco: Never Used  . Tobacco comment: 2-3 cigarettes daily  Substance and Sexual Activity  . Alcohol use: Yes    Alcohol/week: 0.0 standard drinks    Comment: Drinks about 24 oz beer a night; previously a 6-pack a day./ Smokes marjuana daily  . Drug use: Yes    Types: Marijuana    Comment: "last = last night" per pt  . Sexual activity: Not on file  Lifestyle  . Physical activity    Days per week: Not on file    Minutes per session: Not on file  . Stress: Not on file  Relationships  . Social Herbalist on phone: Not on file    Gets together: Not on file    Attends religious service: Not on file    Active  member of club or organization: Not on file    Attends meetings of clubs or organizations: Not on file    Relationship status: Not on file  Other Topics Concern  . Not on file  Social History Narrative  . Not on file   Family History  Problem Relation Age of Onset  . Pneumonia Mother   . Aneurysm Father   . Colon cancer Neg Hx    Outpatient Encounter Medications as of 08/20/2018  Medication Sig  . amLODipine (NORVASC) 5 MG tablet Take 5 mg by mouth daily.   . Aspirin-Salicylamide-Caffeine (BC HEADACHE) 325-95-16 MG TABS Take 1 packet by mouth 2 (two) times daily.  . baclofen (LIORESAL) 10 MG tablet TAKE 1 TABLET BY MOUTH 3 TIMES DAILY BEFORE MEALS.  . HYDROcodone-acetaminophen (NORCO) 10-325 MG tablet Take 0.5-1 tablets by mouth every 6 (six) hours as needed.   Marland Kitchen lisinopril-hydrochlorothiazide (PRINZIDE,ZESTORETIC) 20-12.5 MG tablet Take 1 tablet by mouth 2 (two) times daily.   Marland Kitchen lovastatin (MEVACOR) 20 MG tablet Take 20 mg by mouth daily.   Marland Kitchen omeprazole-sodium bicarbonate (ZEGERID) 40-1100 MG capsule  Take 1 capsule by mouth daily before breakfast. (Patient not taking: Reported on 06/06/2016)  . potassium chloride SA (K-DUR,KLOR-CON) 20 MEQ tablet Take 1 tablet (20 mEq total) by mouth daily.  . ranitidine (ZANTAC) 75 MG tablet Take 150 mg by mouth daily as needed for heartburn.   . venlafaxine XR (EFFEXOR-XR) 75 MG 24 hr capsule Take 150 mg by mouth daily with breakfast.    No facility-administered encounter medications on file as of 08/20/2018.    ALLERGIES: Allergies  Allergen Reactions  . Tetracyclines & Related Other (See Comments)    Makes her sick on her stomach     VACCINATION STATUS:  There is no immunization history on file for this patient.  HPI Laura Mcgee is 68 y.o. female who presents today with a medical history as above. she is being seen in consultation for possible Cushing syndrome requested by Lemmie Evens, MD.   She denies any prior history of adrenal, thyroid, nor parathyroid dysfunction. -She is known to have adrenal hyperplasia of bilateral origin since at least 2018.  That was unchanged on her recent abdominal imaging on Jul 09, 2018 CT scan of the abdomen done as work-up for complaint of GI bleed. Her main complaint today is progressive weight gain.  Review of her medical records shows a gain of 7 pounds since March 2018, which is 2 years ago. -On further interview, she replies affirmatively for easy bruising.  She is a chronic heavy smoker, complains of on and off cough and shortness of breath on exertion.  She did not have adrenal endocrine evaluation.  She denies palpitations, difficult to control hypertension, nor excessive diaphoresis.   Review of Systems  Constitutional: + recent weight gain, + fatigue, no subjective hyperthermia, no subjective hypothermia Eyes: no blurry vision, no xerophthalmia ENT: no sore throat, no nodules palpated in throat, no dysphagia/odynophagia, no hoarseness Cardiovascular: no Chest Pain, + Shortness of Breath, no  palpitations, no leg swelling Respiratory: + cough, no shortness of breath Gastrointestinal: no Nausea/Vomiting/Diarhhea Musculoskeletal: no muscle/joint aches Skin: no rashes Neurological: no tremors, no numbness, no tingling, no dizziness Psychiatric: + depression, no anxiety  Objective:    BP (!) 141/91   Pulse 95   Temp (!) 97.2 F (36.2 C)   Ht 5\' 6"  (1.676 m)   Wt 184 lb 6.4 oz (83.6 kg)   LMP 06/20/1997 (Approximate) Comment: post menopausal  BMI 29.76 kg/m   Wt Readings from Last 3 Encounters:  08/20/18 184 lb 6.4 oz (83.6 kg)  06/06/16 175 lb (79.4 kg)  05/31/16 177 lb (80.3 kg)    Physical Exam  Constitutional: +  over weight for height, not in acute distress, normal state of mind Eyes: PERRLA, EOMI, no exophthalmos ENT: moist mucous membranes, no gross thyromegaly, no gross cervical lymphadenopathy Cardiovascular: normal precordial activity, Regular Rate and Rhythm, no Murmur/Rubs/Gallops Respiratory:  adequate breathing efforts, no gross chest deformity, Clear to auscultation bilaterally Gastrointestinal: abdomen soft, Non -tender, No distension, Bowel Sounds present, no gross organomegaly Musculoskeletal: no gross deformities, strength intact in all four extremities Skin: moist, warm, no rashes, + various bruises on bilateral upper extremities, patient describes cats scratching her skin.    Neurological: no tremor with outstretched hands, anxious affect.    CMP ( most recent) CMP     Component Value Date/Time   NA 139 06/06/2018   K 4.3 06/06/2018   CL 91 (L) 06/06/2016 1549   CO2 19 (L) 06/06/2016 1549   GLUCOSE 109 (H) 06/06/2016 1549   BUN 23 (H) 06/06/2016 1549   CREATININE 1.04 (H) 06/06/2016 1549   CALCIUM 9.0 06/06/2016 1549   PROT 7.3 06/06/2016 1549   ALBUMIN 3.0 (L) 06/06/2016 1549   AST 20 06/06/2016 1549   ALT 20 06/06/2016 1549   ALKPHOS 115 06/06/2016 1549   BILITOT 0.7 06/06/2016 1549   GFRNONAA 55 (L) 06/06/2016 1549   GFRAA >60  06/06/2016 1549    Diabetic Labs (most recent): Lab Results  Component Value Date   HGBA1C 5.4 06/06/2018     Lipid Panel ( most recent) Lipid Panel     Component Value Date/Time   CHOL 186 06/06/2018   TRIG 100 06/06/2018   HDL 54 06/06/2018   LDLCALC 100 06/06/2018   CT abdomen/pelvis Jul 09, 2018 IMPRESSION: 1. No acute abnormalities of the abdomen or pelvis. 2. Unchanged bilateral macronodular adrenal hyperplasia. Does the  patient have symptoms of Cushing's syndrome? 3. Severe spinal stenosis at L4-5. Moderately severe spinal stenosis at L3-4. 4.  Aortic Atherosclerosis (ICD10-I70.0).  Assessment & Plan:   1. Adrenal hyperplasia ([place])  - Diani Jillson  is being seen at a kind request of Lemmie Evens, MD. - I have reviewed her available endocrine records and clinically evaluated the patient. -I have reviewed the incidental finding of possible bilateral adrenal hyperplasia, stable since 2018. -The abdominal  CT scan from Jul 09, 2018 incidentally documents bilateral macronodular adrenal hyperplasia, however, there is not sufficient adrenal functional information to proceed with definitive treatment plan. -Patient describes previous exposure to various dose of steroids related to upper respiratory tract infection.  No recent exposure to steroids.  -I will send her to lab for 24-hour urine sample for hormonal measurements including cortisol, aldosterone, catecholamines and metanephrines.  -If this study is not conclusive, she will be considered for overnight low-dose dexamethasone suppression test after her next visit.  -She will be considered for specific treatment accordingly.  In this particular patient with chronic heavy smoking since her teenage years, malignancy risk is high.  If she continues to have cough/shortness of breath, she may benefit from dedicated imaging of the chest/thorax.  This will be considered if she is found to have  Ectopic ACTH dependent Cushing's  syndrome.  -she will return in 10 days to review her repeat labs.  - I did not initiate any new prescriptions today. - I advised her  to maintain  close follow up with Lemmie Evens, MD for primary care needs.   - Time spent with the patient: 45 minutes, of which >50% was spent in obtaining information about her symptoms, reviewing her previous labs/studies,  evaluations, and treatments, counseling her about her bilateral adrenal hyperplasia, and developing a plan to confirm the diagnosis and long term treatment based on the latest standards of care/guidelines.    Eddy Termine participated in the discussions, expressed understanding, and voiced agreement with the above plans.  All questions were answered to her satisfaction. she is encouraged to contact clinic should she have any questions or concerns prior to her return visit.  Follow up plan: Return in about 10 days (around 08/30/2018), or 24 hour urine studies, for 24 Hour Urine  studies.   Glade Lloyd, MD [facility] [address] [place], [place] [address] Phone: [phone]  Fax: [phone]     08/20/2018, 4:49 PM  This note was partially dictated with voice recognition software. Similar sounding words can be transcribed inadequately or may not  be corrected upon review.

## 2018-08-24 ENCOUNTER — Other Ambulatory Visit (HOSPITAL_COMMUNITY): Payer: Self-pay | Admitting: Family Medicine

## 2018-08-24 ENCOUNTER — Encounter (HOSPITAL_COMMUNITY): Payer: Self-pay

## 2018-08-24 ENCOUNTER — Ambulatory Visit (HOSPITAL_COMMUNITY)
Admission: RE | Admit: 2018-08-24 | Discharge: 2018-08-24 | Disposition: A | Discharge status: Home / Self Care | Payer: Medicare Other | Source: Ambulatory Visit | Attending: Family Medicine | Admitting: Family Medicine

## 2018-08-24 ENCOUNTER — Other Ambulatory Visit: Payer: Self-pay

## 2018-08-24 DIAGNOSIS — R0602 Shortness of breath: Secondary | ICD-10-CM

## 2018-08-30 ENCOUNTER — Ambulatory Visit: Payer: Medicare Other | Admitting: "Endocrinology

## 2018-09-19 ENCOUNTER — Other Ambulatory Visit: Payer: Self-pay

## 2018-09-19 ENCOUNTER — Encounter (HOSPITAL_COMMUNITY): Payer: Self-pay

## 2018-09-19 ENCOUNTER — Ambulatory Visit (HOSPITAL_COMMUNITY): Payer: Medicare Other | Attending: Family Medicine

## 2018-09-19 DIAGNOSIS — R262 Difficulty in walking, not elsewhere classified: Secondary | ICD-10-CM | POA: Insufficient documentation

## 2018-09-19 DIAGNOSIS — R29898 Other symptoms and signs involving the musculoskeletal system: Secondary | ICD-10-CM | POA: Diagnosis present

## 2018-09-19 DIAGNOSIS — M6281 Muscle weakness (generalized): Secondary | ICD-10-CM | POA: Diagnosis present

## 2018-09-19 DIAGNOSIS — R2681 Unsteadiness on feet: Secondary | ICD-10-CM | POA: Diagnosis present

## 2018-09-19 NOTE — Therapy (Signed)
[place], [place], [address] Phone: [phone]   Fax:  [phone]  Physical Therapy Evaluation  Patient Details  Name: Laura Mcgee MRN: [id number] Date of Birth: April 26, 1950 Referring Provider (PT): Leslie Andrea, MD   Encounter Date: 09/19/2018  PT End of Session - 09/19/18 1216    Visit Number  1    Number of Visits  8    Date for PT Re-Evaluation  10/17/18    Authorization Type  UHC Medicare (Secondary: Medicaid)    Authorization Time Period  09/19/18 to 10/17/18    Authorization - Visit Number  1    Authorization - Number of Visits  10    PT Start Time  1112    PT Stop Time  1158    PT Time Calculation (min)  46 min    Equipment Utilized During Treatment  Gait belt    Activity Tolerance  Patient tolerated treatment well    Behavior During Therapy  [facility] for tasks assessed/performed       Past Medical History:  Diagnosis Date  . Depression   . Difficult intubation    History of laryngospasm  . GERD (gastroesophageal reflux disease)   . Hyperlipidemia   . Hypertension   . Pulmonary embolism ([place]) 1973    Past Surgical History:  Procedure Laterality Date  . COLONOSCOPY WITH PROPOFOL N/A 04/07/2015   SLF: 1. seven colorectal polyps removed 2. the left colon is redundant 3. small internal hemorrhoids (1 simple adenoma, 6 hyperplastic)   . CYST EXCISION Left    Arm  . ESOPHAGOGASTRODUODENOSCOPY (EGD) WITH PROPOFOL N/A 04/07/2015   SLF: 1. Schatzki ring 2. Bravo capsule 34 cm from the teeth 3. moderate non-erosive gastritis.   Marland Kitchen MASS EXCISION N/A 2002   Lanyx  . MASS EXCISION Left 01/21/2015   Procedure: EXCISION CYST LEFT UPPER ARM;  Surgeon: Aviva Signs, MD;  Location: [facility];  Service: General;  Laterality: Left;  Marland Kitchen MICROLARYNGOSCOPY N/A 05/31/2016   Procedure: MICRO LARYNGOSCOPY WITH BIOPSY OF VOCAL CORD LESION;  Surgeon: Leta Baptist, MD;  Location: [facility];  Service: ENT;   Laterality: N/A;  . POLYPECTOMY N/A 04/07/2015   Procedure: POLYPECTOMY;  Surgeon: Danie Binder, MD;  [facility];  Service: Endoscopy;  Laterality: N/A;  Descending colon polyps x 3   . SAVORY DILATION N/A 04/07/2015   Procedure: SAVORY DILATION;  Surgeon: Danie Binder, MD;  [facility];  Service: Endoscopy;  Laterality: N/A;    There were no vitals filed for this visit.   Subjective Assessment - 09/19/18 1115    Subjective  Pt states that she is having an off-balance issue. Not sure what's causing it. Went to ENT who told her it was not vertigo. Went to a second ENT who also told her that it wasn't vertigo, wasn't the crystals, and told her that he wasn't sure what caused it and recommended PT. She states this started insidiously 1YA; she had had ear infection in L ear twice 1YA and ever since she has been having this off balance feeling. She does reports falls due to this but states it has been >6MA. She reports mulitple close calls at least every other week to every week. She denies spinning, lightheadedness, and symptoms with quick head turns rolling, but does state that after she sits for a long time, she will feel the unsteadiness/off balance feeling; denies feelings of lightheadedness or blood rushing out  of head when she stands up from sitting, just feels off balance. Walking straight is what she has the most difficulty with. Reports that her BP is well controlled.    Limitations  Walking    How long can you sit comfortably?  no issues    How long can you stand comfortably?  no issues    How long can you walk comfortably?  not long    Patient Stated Goals  walk straight again    Currently in Pain?  No/denies         [facility] PT Assessment - 09/19/18 0001      Assessment   Medical Diagnosis  Balance    Referring Provider (PT)  Leslie Andrea, MD    Onset Date/Surgical Date  09/18/17    Next MD Visit  09/28/18    Prior Therapy  none      Balance Screen    Has the patient fallen in the past 6 months  No    Has the patient had a decrease in activity level because of a fear of falling?   No    Is the patient reluctant to leave their home because of a fear of falling?   No      Home Environment   Living Environment  Private residence    Type of [place] to enter    Entrance Stairs-Number of Steps  5    Entrance Stairs-Rails  Can reach both    [place]  One level      Prior Function   Level of [place]  Retired    Leisure  play on phone      Observation/Other Assessments   Observations  smooth pursuits: mild end range nystagmus (normal for age), saccades: WNL, head shake nystagmus: WNL, head impulse test: WNL     Focus on Therapeutic Outcomes (FOTO)   to be completed next month      Sensation   Light Touch  Impaired Detail    Light Touch Impaired Details  Impaired RLE   diminished feeling throughout RLE compared to LLE   Proprioception  Appears Intact   100% correct for BLE for great toe proprioception   Additional Comments  sharp/dull: 6/10 incorrect on RLE, 3/10 incorrect on LLE      Functional Tests   Functional tests  Sit to Stand      Sit to Stand   Comments  30sec chair rise test: 9x      ROM / Strength   AROM / PROM / Strength  Strength      Strength   Strength Assessment Site  Hip;Knee;Ankle    Right Hip Flexion  4/5    Right Hip Extension  4/5    Right Hip ABduction  4/5    Left Hip Flexion  4/5    Left Hip Extension  4/5    Left Hip ABduction  4/5    Right Knee Flexion  4-/5    Right Knee Extension  5/5    Left Knee Flexion  5/5    Left Knee Extension  4-/5    Right Ankle Dorsiflexion  4/5    Left Ankle Dorsiflexion  4+/5      Balance   Balance Assessed  Yes      Static Standing Balance   Static Standing - Balance Support  No upper extremity supported    Static Standing Balance -  Activities   Single Leg Stance - Right Leg;Single Leg  Stance - Left Leg    Static Standing - Comment/# of Minutes  L: 1.45 sec or <; R: 3sec or <   very unsteady for BLE     Standardized Balance Assessment   Standardized Balance Assessment  Dynamic Gait Index      Dynamic Gait Index   Level Surface  Moderate Impairment    Change in Gait Speed  Moderate Impairment    Gait with Horizontal Head Turns  Mild Impairment    Gait with Vertical Head Turns  Mild Impairment    Gait and Pivot Turn  Mild Impairment    Step Over Obstacle  Moderate Impairment    Step Around Obstacles  Mild Impairment    Steps  Mild Impairment    Total Score  13            Objective measurements completed on examination: See above findings.        PT Education - 09/19/18 1215    Education Details  exam findings, HEP, POC    Person(s) Educated  Patient    Methods  Explanation;Demonstration;Handout    Comprehension  Verbalized understanding       PT Short Term Goals - 09/19/18 1222      PT SHORT TERM GOAL #1   Title  Pt will be able to perform bil SLS for 5 sec in order to demo improved hip and ankle stability and maximize her stairs.    Time  2    Period  Weeks    Status  New    Target Date  10/17/18      PT SHORT TERM GOAL #2   Title  Pt will score 17/24 on the DGI to demo improved dynamic balance and reduce her risk for falls.    Time  2    Period  Weeks    Status  New      PT SHORT TERM GOAL #3   Title  Pt will be able to perform 12STS during 30sec chair rise test to deo improved functional strength and balance in order to reduce her risk for falls.    Time  2    Period  Weeks    Status  New        PT Long Term Goals - 09/19/18 1222      PT LONG TERM GOAL #1   Title  Pt will have improved MMT by 1/2 grade in order to maximize her gait and reduce her risk for falls.    Time  4    Period  Weeks    Status  New    Target Date  10/17/18      PT LONG TERM GOAL #2   Title  Pt will score 20/24 on the DGI in order to further demo  improved dynamic balance further reduce her risk for falls.    Time  4    Period  Weeks    Status  New      PT LONG TERM GOAL #3   Title  Pt will be able to perform bil SLS for 8 sec or > to further demo improved balance and reduce her risk for falls.    Time  4    Period  Weeks    Status  New      PT LONG TERM GOAL #4   Title  Pt will subjectively report 50% reduction in feelings of unsteadiness/off balance during  gait and when standing after sitting for long periods of time to reduce her risk for falls and demo improved QOL.    Time  4    Period  Weeks    Status  New             Plan - 09/19/18 1216    Clinical Impression Statement  Pt is pleasant 68YO F who presents to OPPT with c/o feeling off-balance for the last year of insidious onset. Pt denying s/s consistent with BPPV, denying BP issues, and VOR testing negative, indicating very low risk for positional vertigo or true vestibular impairments. Pt does present with bil weak hips and ankles, deficits in sensation bil feet, R>L, functional strength, and static and dynamic balance. Pt only able to complete 9STS during 30sec chair rise test; the norm for her health aged-matched peers is <11 indicates increased risk for falls. It also took her 16 sec to complete 5xSTS (measured during 30sec chair rise test), which again, indicates increased risk for falls based on normative values for her age range (45-69: 11.4sec is considered normal). Pt scored 13/24 on the DGI, further indicating increased risk for falls during ambulation. Pt reports numbness sin bil feet; denies DM, but does report being current smoker and stating that her toes, especially on the L foot, can turn blue. Pt also reporting h/o K+ insufficiency and being taken off her K+ supplement 1YA; educated pt on risk factors of smoking and potential side effects of low K+ and encouraged pt to talk to MD about this. Pt needs skilled PT intervention to address these impairments in  order to maximize her strength and balance and reduce her risk for falls.    Personal Factors and Comorbidities  Comorbidity 3+;Time since onset of injury/illness/exacerbation    Comorbidities  see above    Examination-Activity Limitations  Locomotion Level;Stairs;Stand    Examination-Participation Restrictions  Community Activity;Yard Work    Merchant navy officer  Stable/Uncomplicated    Designer, jewellery  Low    Rehab Potential  Fair    PT Frequency  2x / week    PT Duration  4 weeks    PT Treatment/Interventions  ADLs/Self Care Home Management;Aquatic Therapy;Biofeedback;Canalith Repostioning;Cryotherapy;Electrical Stimulation;Moist Heat;Traction;Ultrasound;DME Instruction;Gait training;Stair training;Functional mobility training;Therapeutic activities;Therapeutic exercise;Balance training;Neuromuscular re-education;Patient/family education;Orthotic Fit/Training;Manual techniques;Passive range of motion;Dry needling;Energy conservation;Taping;Vestibular;Spinal Manipulations;Joint Manipulations    PT Next Visit Plan  reveiw goals, administer FOTO;review HEP if needed; begin functional strengthening, static and dynamic balance training, progressing as able. assess BPPV only if felt it is necessary (low risk for this)    PT Home Exercise Plan  eval: supine bridge, sidelying clams, standing heel and toe raises    Consulted and Agree with Plan of Care  Patient       Patient will benefit from skilled therapeutic intervention in order to improve the following deficits and impairments:  Abnormal gait, Decreased activity tolerance, Decreased balance, Decreased mobility, Decreased strength, Difficulty walking, Increased muscle spasms, Impaired sensation, Postural dysfunction  Visit Diagnosis: 1. Unsteadiness on feet   2. Difficulty in walking, not elsewhere classified   3. Muscle weakness (generalized)   4. Other symptoms and signs involving the musculoskeletal system         Problem List Patient Active Problem List   Diagnosis Date Noted  . Chronic cough 04/27/2016  . Constipation 04/27/2016  . GERD (gastroesophageal reflux disease) 03/30/2015  . Dysphagia 03/30/2015  . Encounter for screening colonoscopy 03/30/2015       Geraldine Solar PT,  DPT   [facility] [address] [place], [place], [address] Phone: [phone]   Fax:  [phone]  Name: Laura Mcgee MRN: [id number] Date of Birth: 07/21/1950

## 2018-09-19 NOTE — Patient Instructions (Signed)
Access Code: [email or web address]  URL: [email or web address]  Date: 09/19/2018  Prepared by: Geraldine Solar   Exercises Supine Bridge - 10 reps - 3 sets - 1x daily - 7x weekly Clamshell - 10 reps - 3 sets - 1x daily - 7x weekly Heel Toe Raises with Counter Support - 10 reps - 3 sets - 1x daily - 7x weekly

## 2018-09-26 ENCOUNTER — Telehealth (HOSPITAL_COMMUNITY): Payer: Self-pay | Admitting: Family Medicine

## 2018-09-26 ENCOUNTER — Ambulatory Visit (HOSPITAL_COMMUNITY): Payer: Medicare Other

## 2018-09-26 ENCOUNTER — Telehealth (HOSPITAL_COMMUNITY): Payer: Self-pay

## 2018-09-26 NOTE — Telephone Encounter (Signed)
No Show#1: This therapist called pt and left voicemail regarding missed appointment today (09/26/18 at 9:15). Reminded pt of next appointment Friday 09/28/18 at 9:30 and educated pt to call clinic [phone]) to let us know how she is doing and if she can make it to next appointment.  Talbot Grumbling PT, DPT

## 2018-09-26 NOTE — Telephone Encounter (Signed)
09/26/18  Pt called back to say she overslept and that was why she missed her appt today.  She wanted to change the 7/24 appt to the afternoon but we had nothing available.  She said that she would just have to cx that would be at the next appt.

## 2018-09-28 ENCOUNTER — Ambulatory Visit (HOSPITAL_COMMUNITY): Payer: Medicare Other

## 2018-10-02 ENCOUNTER — Ambulatory Visit: Payer: Medicare Other | Admitting: "Endocrinology

## 2018-10-02 ENCOUNTER — Ambulatory Visit (HOSPITAL_COMMUNITY): Payer: Medicare Other | Admitting: Physical Therapy

## 2018-10-02 ENCOUNTER — Telehealth (HOSPITAL_COMMUNITY): Payer: Self-pay | Admitting: Physical Therapy

## 2018-10-02 ENCOUNTER — Other Ambulatory Visit: Payer: Self-pay
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
## 2018-10-04 ENCOUNTER — Other Ambulatory Visit: Payer: Self-pay

## 2018-10-04 ENCOUNTER — Encounter: Payer: Self-pay | Admitting: Neurology

## 2018-10-04 ENCOUNTER — Telehealth: Payer: Self-pay | Admitting: Neurology

## 2018-10-04 ENCOUNTER — Ambulatory Visit (INDEPENDENT_AMBULATORY_CARE_PROVIDER_SITE_OTHER): Payer: Medicare Other | Admitting: Neurology

## 2018-10-04 VITALS — BP 128/89 | HR 98 | Temp 97.3°F | Ht 66.0 in | Wt 184.0 lb

## 2018-10-04 DIAGNOSIS — R159 Full incontinence of feces: Secondary | ICD-10-CM

## 2018-10-04 DIAGNOSIS — R269 Unspecified abnormalities of gait and mobility: Secondary | ICD-10-CM | POA: Diagnosis not present

## 2018-10-04 DIAGNOSIS — G3281 Cerebellar ataxia in diseases classified elsewhere: Secondary | ICD-10-CM

## 2018-10-04 DIAGNOSIS — R202 Paresthesia of skin: Secondary | ICD-10-CM

## 2018-10-04 MED ORDER — GABAPENTIN 100 MG PO CAPS: 100.0000 mg | ORAL_CAPSULE | Freq: Three times a day (TID) | ORAL | 11 refills | Status: DC

## 2018-10-04 NOTE — Telephone Encounter (Signed)
Noted  

## 2018-10-04 NOTE — Progress Notes (Signed)
PATIENT: Laura Mcgee DOB: 1950-06-14  Chief Complaint  Patient presents with  . Numbness    She is here with her daughter, Laura Mcgee. Reports numbness/tingling in fingers and toes.  Marland Kitchen PCP    Lemmie Evens, MD     HISTORICAL  Laura Mcgee is a 68 year old female, seen in request by primary care doctor Lemmie Evens for evaluation of bilateral lower extremity and intermittent upper extremity paresthesia, gait abnormality, incontinence, initial evaluation was on October 04 2018.  I have reviewed and summarized the referring note from the referring physician.  She has past medical history of hypertension, hyperlipidemia, presented with gradual worsening low back pain over the past few years, she also has gradual onset bilateral feet especially since 2020, frequent bilateral calf, foot muscle cramping, also gradually experienced urinary and bowel incontinence, urgency, intermittent diarrhea mixed with constipation.  She denies significant neck pain, since 2020, she also began to experience intermittent bilateral upper extremity paresthesia, such as driving, she would develop numbness of all 5 fingers, she had to shake her hands  CT abdomen for evaluation of alternating diarrhea and constipation in May 2020 reviewed, no acute abnormality of abdomen and pelvis, severe spinal stenosis at L4- 5, moderately severe at L3-4  REVIEW OF SYSTEMS: Full 14 system review of systems performed and notable only for as above All other review of systems were negative.  ALLERGIES: Allergies  Allergen Reactions  . Tetracyclines & Related Other (See Comments)    Makes her sick on her stomach     HOME MEDICATIONS: Current Outpatient Medications  Medication Sig Dispense Refill  . amLODipine (NORVASC) 5 MG tablet Take 5 mg by mouth daily.     . Aspirin-Salicylamide-Caffeine (BC HEADACHE) 325-95-16 MG TABS Take 1 packet by mouth 2 (two) times daily.    . baclofen (LIORESAL) 10 MG tablet TAKE 1 TABLET BY  MOUTH 3 TIMES DAILY BEFORE MEALS. 90 tablet 1  . HYDROcodone-acetaminophen (NORCO) 10-325 MG tablet Take 0.5-1 tablets by mouth every 6 (six) hours as needed.     Marland Kitchen lisinopril-hydrochlorothiazide (PRINZIDE,ZESTORETIC) 20-12.5 MG tablet Take 1 tablet by mouth 2 (two) times daily.     Marland Kitchen lovastatin (MEVACOR) 20 MG tablet Take 20 mg by mouth daily.     Marland Kitchen omeprazole-sodium bicarbonate (ZEGERID) 40-1100 MG capsule Take 1 capsule by mouth daily before breakfast. 30 capsule 3  . potassium chloride SA (K-DUR,KLOR-CON) 20 MEQ tablet Take 1 tablet (20 mEq total) by mouth daily. 10 tablet 0  . ranitidine (ZANTAC) 75 MG tablet Take 150 mg by mouth daily as needed for heartburn.     . venlafaxine XR (EFFEXOR-XR) 75 MG 24 hr capsule Take 150 mg by mouth daily with breakfast.      No current facility-administered medications for this visit.     PAST MEDICAL HISTORY: Past Medical History:  Diagnosis Date  . Depression   . Difficult intubation    History of laryngospasm  . GERD (gastroesophageal reflux disease)   . Hyperlipidemia   . Hypertension   . Numbness and tingling   . Pulmonary embolism ([place]) 1973    PAST SURGICAL HISTORY: Past Surgical History:  Procedure Laterality Date  . COLONOSCOPY WITH PROPOFOL N/A 04/07/2015   SLF: 1. seven colorectal polyps removed 2. the left colon is redundant 3. small internal hemorrhoids (1 simple adenoma, 6 hyperplastic)   . CYST EXCISION Left    Arm  . ESOPHAGOGASTRODUODENOSCOPY (EGD) WITH PROPOFOL N/A 04/07/2015   SLF: 1. Schatzki ring 2. Bravo capsule  34 cm from the teeth 3. moderate non-erosive gastritis.   Marland Kitchen MASS EXCISION N/A 2002   Lanyx  . MASS EXCISION Left 01/21/2015   Procedure: EXCISION CYST LEFT UPPER ARM;  Surgeon: Aviva Signs, MD;  Location: [facility];  Service: General;  Laterality: Left;  Marland Kitchen MICROLARYNGOSCOPY N/A 05/31/2016   Procedure: MICRO LARYNGOSCOPY WITH BIOPSY OF VOCAL CORD LESION;  Surgeon: Leta Baptist, MD;  Location: [place];   Service: ENT;  Laterality: N/A;  . POLYPECTOMY N/A 04/07/2015   Procedure: POLYPECTOMY;  Surgeon: Danie Binder, MD;  [facility];  Service: Endoscopy;  Laterality: N/A;  Descending colon polyps x 3   . SAVORY DILATION N/A 04/07/2015   Procedure: SAVORY DILATION;  Surgeon: Danie Binder, MD;  [facility];  Service: Endoscopy;  Laterality: N/A;    FAMILY HISTORY: Family History  Problem Relation Age of Onset  . Pneumonia Mother   . Aneurysm Father   . Colon cancer Neg Hx     SOCIAL HISTORY: Social History   Socioeconomic History  . Marital status: Single    Spouse name: Not on file  . Number of children: 1  . Years of education: some college  . Highest education level: Not on file  Occupational History  . Occupation: Retired  Scientific laboratory technician  . Financial resource strain: Not on file  . Food insecurity    Worry: Not on file    Inability: Not on file  . Transportation needs    Medical: Not on file    Non-medical: Not on file  Tobacco Use  . Smoking status: Current Every Day Smoker    Packs/day: 1.50    Years: 25.00    Pack years: 37.50    Types: Cigarettes  . Smokeless tobacco: Never Used  Substance and Sexual Activity  . Alcohol use: Yes    Comment: Drinks two beer per night.  . Drug use: Yes    Types: Marijuana    Comment: daily marijuana use  . Sexual activity: Not on file  Lifestyle  . Physical activity    Days per week: Not on file    Minutes per session: Not on file  . Stress: Not on file  Relationships  . Social Herbalist on phone: Not on file    Gets together: Not on file    Attends religious service: Not on file    Active member of club or organization: Not on file    Attends meetings of clubs or organizations: Not on file    Relationship status: Not on file  . Intimate partner violence    Fear of current or ex partner: Not on file    Emotionally abused: Not on file    Physically abused: Not on file    Forced  sexual activity: Not on file  Other Topics Concern  . Not on file  Social History Narrative   Lives at home alone.   Right-handed.   Two cups sweet tea daily.     PHYSICAL EXAM   Vitals:   10/04/18 1431  BP: 128/89  Pulse: 98  Temp: (!) 97.3 F (36.3 C)  Weight: 184 lb (83.5 kg)  Height: 5\' 6"  (1.676 m)    Not recorded      Body mass index is 29.7 kg/m.  PHYSICAL EXAMNIATION:  Gen: NAD, conversant, well nourised, obese, well groomed  Cardiovascular: Regular rate rhythm, no peripheral edema, warm, nontender. Eyes: Conjunctivae clear without exudates or hemorrhage Neck: Supple, no carotid bruits. Pulmonary: Clear to auscultation bilaterally   NEUROLOGICAL EXAM:  MENTAL STATUS: Speech:    Speech is normal; fluent and spontaneous with normal comprehension.  Cognition:     Orientation to time, place and person     Normal recent and remote memory     Normal Attention span and concentration     Normal Language, naming, repeating,spontaneous speech     Fund of knowledge   CRANIAL NERVES: CN II: Visual fields are full to confrontation.  Pupils are round equal and briskly reactive to light. CN III, IV, VI: extraocular movement are normal. No ptosis. CN V: Facial sensation is intact to pinprick in all 3 divisions bilaterally. Corneal responses are intact.  CN VII: Face is symmetric with normal eye closure and smile. CN VIII: Hearing is normal to rubbing fingers CN IX, X: Palate elevates symmetrically. Phonation is normal. CN XI: Head turning and shoulder shrug are intact CN XII: Tongue is midline with normal movements and no atrophy.  MOTOR: She has no significant upper extremity proximal distal muscle weakness, no significant bilateral lower extremity proximal muscle weakness, mild bilateral ankle dorsiflexion, mild to moderate bilateral toe flexion extension weakness  REFLEXES: Reflexes are 2+ and symmetric at the biceps, triceps, knees, and  absent ankles. Plantar responses are flexor.  SENSORY: Length dependent decreased to light touch, pinprick, vibratory sensation to distal leg    COORDINATION: Rapid alternating movements and fine finger movements are intact. There is no dysmetria on finger-to-nose and heel-knee-shin.    GAIT/STANCE: She needs to push on chair arm to get up from seated position, wide-based, mildly unsteady, difficulty standing up on heels, and tiptoe,  DIAGNOSTIC DATA (LABS, IMAGING, TESTING) - I reviewed patient records, labs, notes, testing and imaging myself where available.   ASSESSMENT AND PLAN  Laura Mcgee is a 68 y.o. female   Gait abnormality Upper and lower extremity paresthesia  Evidence of length dependent sensory changes, distal leg weakness,  Differentiation diagnosis includes lumbar radiculopathy, peripheral neuropathy, increased reflexes at bilateral patella suggestive of cervical myelopathy  Proceed with MRI of lumbar, cervical,   EMG/NCS.  Gabapentin 100mg  3 times a day for bilateral lower extremity cramping   Marcial Pacas, M.D. Ph.D.  [facility] [address] [place], [place] [address] Ph: [phone] Fax: [phone]  CC: Lemmie Evens, MD

## 2018-10-04 NOTE — Telephone Encounter (Signed)
She would like mRI done at [facility]

## 2018-10-05 ENCOUNTER — Telehealth (HOSPITAL_COMMUNITY): Payer: Self-pay

## 2018-10-05 ENCOUNTER — Ambulatory Visit (HOSPITAL_COMMUNITY): Payer: Medicare Other

## 2018-10-05 NOTE — Telephone Encounter (Signed)
No show; attempted to call twice.  Got a message following several rings that "call can not be complete at this time."  2nd attempt sounds like someone answered phone but no one spoke.  Unable to leave message or speak to pt.    [address] [place]; [facility] [phone]

## 2018-10-08 ENCOUNTER — Telehealth (HOSPITAL_COMMUNITY): Payer: Self-pay

## 2018-10-08 ENCOUNTER — Ambulatory Visit (HOSPITAL_COMMUNITY): Payer: Medicare Other

## 2018-10-08 NOTE — Telephone Encounter (Signed)
[employer] medicare/medicad no auth. Patient is scheduled at [facility] for 10/11/18 arrival time is 2:30 pm. I did leave a vmail on patient phone informing her of this and if this was not a good time or day for her to call [phone] to r/s.

## 2018-10-08 NOTE — Telephone Encounter (Signed)
No Show #3. This therapist called and left message on pt's voicemail [phone]) regarding missed appointment today at 9:15 and educated pt on next appointment August 5 at 10:15. Educated pt to call therapist back [phone] regards to missed appointments and if able to attend upcoming appointment.  Talbot Grumbling PT, DPT 10/08/18, 9:35 AM [phone]

## 2018-10-10 ENCOUNTER — Telehealth (HOSPITAL_COMMUNITY): Payer: Self-pay

## 2018-10-10 ENCOUNTER — Ambulatory Visit (HOSPITAL_COMMUNITY): Payer: Medicare Other

## 2018-10-10 ENCOUNTER — Encounter (HOSPITAL_COMMUNITY): Payer: Self-pay

## 2018-10-10 NOTE — Therapy (Signed)
[place], [place], [address] Phone: [phone]   Fax:  [phone]  Patient Details  Name: Laura Mcgee MRN: [id number] Date of Birth: 11/09/1950 Referring Provider:  No ref. provider found  Encounter Date: 10/10/2018  PHYSICAL THERAPY DISCHARGE SUMMARY  Visits from Start of Care: 1  Current functional level related to goals / functional outcomes: See eval note   Remaining deficits: See eval note   Education / Equipment: See eval note  Plan: Patient agrees to discharge.  Patient goals were not met. Patient is being discharged due to not returning since the last visit.  ?????     Pt has not returned to physical therapy since initial evaluation on 09/19/18. Pt has called to cancel 1 appointment, but no showed to 3 appointments. Due to our policy, we are discharging pt from therapy at this time. Fellow PTA, Ihor Austin, called pt and left voicemail to educate her on no show policy and cancelling appointments.    Talbot Grumbling PT, DPT 10/10/18, 9:53 AM [place], [place], [address] Phone: [phone]   Fax:  [phone]

## 2018-10-10 NOTE — Telephone Encounter (Signed)
Called and left message concerning no show policy and her apts cancelled.  Left contact number if she has any questions.    [address] [place]; [facility] [phone]

## 2018-10-11 ENCOUNTER — Ambulatory Visit (HOSPITAL_COMMUNITY): Payer: Medicare Other

## 2018-10-16 ENCOUNTER — Ambulatory Visit (HOSPITAL_COMMUNITY): Payer: Medicare Other

## 2018-10-18 ENCOUNTER — Telehealth: Payer: Self-pay | Admitting: Neurology

## 2018-10-18 ENCOUNTER — Ambulatory Visit (HOSPITAL_COMMUNITY): Payer: Medicare Other | Admitting: Physical Therapy

## 2018-10-18 ENCOUNTER — Ambulatory Visit (HOSPITAL_COMMUNITY)
Admission: RE | Admit: 2018-10-18 | Discharge: 2018-10-18 | Disposition: A | Discharge status: Home / Self Care | Payer: Medicare Other | Source: Ambulatory Visit | Attending: Neurology | Admitting: Neurology

## 2018-10-18 ENCOUNTER — Other Ambulatory Visit: Payer: Self-pay

## 2018-10-18 DIAGNOSIS — G3281 Cerebellar ataxia in diseases classified elsewhere: Secondary | ICD-10-CM | POA: Insufficient documentation

## 2018-10-18 DIAGNOSIS — M48061 Spinal stenosis, lumbar region without neurogenic claudication: Secondary | ICD-10-CM

## 2018-10-18 NOTE — Telephone Encounter (Signed)
I have called patient failed to reach her, called her daughter Maudie Mercury  MRI of lumbar spine showed severe spinal stenosis at L4-5, will refer her to neurosurgeon for decompression, MRI of cervical spine showed mild degenerative changes, no evidence of canal or foraminal narrowing  Please cancel her EMG nerve conduction study    IMPRESSION: 1. 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 2. Severe multifactorial spinal stenosis at L4-5 greater than L3-4. 3. Severe multilevel neural foraminal stenosis.  IMPRESSION: 1. Motion degraded examination. 2. Mild cervical spondylosis without spinal stenosis. 3. Mild right neural foraminal stenosis at C5-6.

## 2018-10-19 NOTE — Telephone Encounter (Signed)
Noted - NCV/EMG appt canceled.

## 2018-10-22 NOTE — Telephone Encounter (Signed)
noted 

## 2018-10-22 NOTE — Telephone Encounter (Signed)
Pt returning call please call back °

## 2018-10-23 NOTE — Telephone Encounter (Signed)
I have spoken with the patient and she has been notified of the MRI results.  She is agreeable to the referral and aware to expect a call from [facility] to schedule her appt.  I also provided her with their office phone number.

## 2018-10-23 NOTE — Telephone Encounter (Signed)
Pt requesting call back to discuss mri , she states she would like more information

## 2018-11-20 ENCOUNTER — Other Ambulatory Visit: Payer: Self-pay

## 2018-11-20 ENCOUNTER — Ambulatory Visit (INDEPENDENT_AMBULATORY_CARE_PROVIDER_SITE_OTHER): Payer: Medicare Other | Admitting: Gastroenterology

## 2018-11-20 ENCOUNTER — Encounter: Payer: Self-pay | Admitting: Gastroenterology

## 2018-11-20 VITALS — BP 150/99 | HR 92 | Temp 97.5°F | Ht 67.0 in | Wt 184.8 lb

## 2018-11-20 DIAGNOSIS — R198 Other specified symptoms and signs involving the digestive system and abdomen: Secondary | ICD-10-CM

## 2018-11-20 DIAGNOSIS — R1011 Right upper quadrant pain: Secondary | ICD-10-CM

## 2018-11-20 MED ORDER — DICYCLOMINE HCL 10 MG PO CAPS: ORAL_CAPSULE | ORAL | 0 refills | Status: DC

## 2018-11-20 NOTE — Patient Instructions (Signed)
1. Add FiberChoice or Benefiber chewables to help even out bowel movements. Take 3-4 grams per day per package instructions.  2. Take bentyl as needed only, up to twice per day for abdominal pain and loose stool. Hold for constipation.  3. Please go for labs.

## 2018-11-20 NOTE — Progress Notes (Signed)
Primary Care Physician: Lemmie Evens, MD  Primary Gastroenterologist:  Barney Drain, MD   Chief Complaint  Patient presents with  . change in bowels    goes between diarrhea/constipation. Has been daily. sometimes it is very little to a lot  . Abdominal Pain    right side that comes and goes    HPI: Laura Mcgee is a 68 y.o. female here for further evaluation of change in bowel habits, right-sided abdominal pain.  Patient was last seen February 2018 for GERD and constipation.  EGD and colonoscopy January 2017.  She had a Schatzki ring, Bravo capsule placed, moderate nonerosive gastritis.  Bravo study showed nonacid reflux on PPI.  7 colorectal polyps removed, one was a simple adenoma the others hyperplastic.  Left colon redundant.  Small internal hemorrhoids.  Next colonoscopy 5 years (2022).  Patient presents with complaints of alternating constipation and diarrhea.  Symptoms been present for at least 1 year.  Stool consistency ranges from [place] 1 through [place] 7.  Often has pressure to have a bowel movement but does not necessarily go.  One soft bloody mucus.  Sometimes passes stool when she feels like she just has to pass gas.  Typically no more than a couple of bowel movements a day.  She has some nausea.  She has some pain in the right rib area, intermittent.  Sometimes dull but sometimes sharp.  Last for minutes at a time.  Can go a week or more without any symptoms.  Not really sure if it is related to meals or bowel movements.  Really has not noticed.  Last year PCP ordered right upper quadrant ultrasound in December 2019.  Gallbladder unremarkable.  Fatty liver.  In May 2020, CT abdomen and pelvis with contrast for abdominal pain and alternating constipation diarrhea, blood in the stool.  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  She has severe degenerative disc disease at L3-L4 and L4-L5 and L5-S1.  Severe spinal stenosis at L4-L5 and  moderately severe spinal stenosis at L3-L4.  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  Saw Dr. Dorris Fetch few months back to be tested for Cushings disease. Did 24 hour urine test.  Patient states that no one can ever produce results and she refused to repeat the test.     Takes hydrocodone three per day for back. Has to go to neurosurgeon for back pain, numbness in feet.   Patient hoping to avoid a colonoscopy right now unless necessary.   Current Outpatient Medications  Medication Sig Dispense Refill  . amLODipine (NORVASC) 10 MG tablet Take 10 mg by mouth daily.     . Aspirin-Salicylamide-Caffeine (BC HEADACHE) 325-95-16 MG TABS Take 1 packet by mouth daily.     Marland Kitchen HYDROcodone-acetaminophen (NORCO) 10-325 MG tablet Take 0.5-1 tablets by mouth every 6 (six) hours as needed.     . lovastatin (MEVACOR) 20 MG tablet Take 20 mg by mouth daily.     Marland Kitchen olmesartan (BENICAR) 20 MG tablet Take 20 mg by mouth daily.    Marland Kitchen venlafaxine XR (EFFEXOR-XR) 75 MG 24 hr capsule Take 150 mg by mouth daily with breakfast.      No current facility-administered medications for this visit.     Allergies as of 11/20/2018 - Review Complete 11/20/2018  Allergen Reaction Noted  . Tetracyclines &  related Other (See Comments) 01/15/2015   Past Medical History:  Diagnosis Date  . Depression   . Difficult intubation    History of laryngospasm  . GERD (gastroesophageal reflux disease)   . Hyperlipidemia   . Hypertension   . Numbness and tingling   . Pulmonary embolism ([place]) 1973   Past Surgical History:  Procedure Laterality Date  . COLONOSCOPY WITH PROPOFOL N/A 04/07/2015   SLF: 1. seven colorectal polyps removed 2. the left colon is redundant 3. small internal hemorrhoids (1 simple adenoma, 6 hyperplastic)   . CYST EXCISION Left    Arm  . ESOPHAGOGASTRODUODENOSCOPY (EGD) WITH PROPOFOL N/A 04/07/2015    SLF: 1. Schatzki ring 2. Bravo capsule 34 cm from the teeth 3. moderate non-erosive gastritis.   Marland Kitchen MASS EXCISION N/A 2002   Lanyx  . MASS EXCISION Left 01/21/2015   Procedure: EXCISION CYST LEFT UPPER ARM;  Surgeon: Aviva Signs, MD;  Location: [facility];  Service: General;  Laterality: Left;  Marland Kitchen MICROLARYNGOSCOPY N/A 05/31/2016   Procedure: MICRO LARYNGOSCOPY WITH BIOPSY OF VOCAL CORD LESION;  Surgeon: Leta Baptist, MD;  Location: [facility];  Service: ENT;  Laterality: N/A;  . POLYPECTOMY N/A 04/07/2015   Procedure: POLYPECTOMY;  Surgeon: Danie Binder, MD;  [facility];  Service: Endoscopy;  Laterality: N/A;  Descending colon polyps x 3   . SAVORY DILATION N/A 04/07/2015   Procedure: SAVORY DILATION;  Surgeon: Danie Binder, MD;  [facility];  Service: Endoscopy;  Laterality: N/A;   Family History  Problem Relation Age of Onset  . Pneumonia Mother   . Aneurysm Father   . Colon cancer Neg Hx    Social History   Tobacco Use  . Smoking status: Current Every Day Smoker    Packs/day: 1.50    Years: 25.00    Pack years: 37.50    Types: Cigarettes  . Smokeless tobacco: Never Used  Substance Use Topics  . Alcohol use: Yes    Comment: Drinks two beer per night.  . Drug use: Yes    Types: Marijuana    Comment: daily marijuana use    ROS:  General: Negative for anorexia, weight loss, fever, chills, fatigue, weakness. ENT: Negative for hoarseness, difficulty swallowing , nasal congestion. CV: Negative for chest pain, angina, palpitations, dyspnea on exertion, peripheral edema.  Respiratory: Negative for dyspnea at rest, dyspnea on exertion, cough, sputum, wheezing.  GI: See history of present illness. GU:  Negative for dysuria, hematuria, urinary incontinence, urinary frequency, nocturnal urination.  Endo: Negative for unusual weight change.    Physical Examination:   BP (!) 150/99   Pulse 92   Temp (!) 97.5 F (36.4 C) (Oral)   Ht 5\' 7"   (1.702 m)   Wt 184 lb 12.8 oz (83.8 kg)   LMP 06/20/1997 (Approximate) Comment: post menopausal  BMI 28.94 kg/m   General: Well-nourished, well-developed in no acute distress. Somewhat difficult historian Eyes: No icterus. Mouth: Oropharyngeal mucosa moist and pink , no lesions erythema or exudate. Lungs: Clear to auscultation bilaterally.  Heart: Regular rate and rhythm, no murmurs rubs or gallops.  Abdomen: Bowel sounds are normal, nontender, nondistended, no hepatosplenomegaly or masses, no abdominal bruits or hernia , no rebound or guarding.   Extremities: No lower extremity edema. No clubbing or deformities. Neuro: Alert and oriented x 4   Skin: Warm and dry, no jaundice.   Psych: Alert and cooperative, normal mood and affect.    Imaging Studies:  No results found.

## 2018-11-21 NOTE — Assessment & Plan Note (Signed)
68 year old female with history of chronic constipation, presenting with alternating constipation and diarrhea.  No more than a couple bowel movements a day, [place] 1 through 7.  Can change within a week's time whether she has more hard stool versus loose stool.  Has some associated right-sided abdominal pain.  Episode of rectal bleeding several months ago.  CT as outlined without explanation for symptoms.  We will add fiber supplement to try to regulate her bowel function.  She can try low dose as needed Bentyl for abdominal pain and of frequent loose stool.  Advised to risk for constipation and drowsiness.  Labs to screen for celiac, thyroid dysfunction, anemia etc.  Encouraged her to follow-up with PCP and/or Dr.Nida regarding bilateral adrenal enlargement.

## 2018-11-25 NOTE — Progress Notes (Signed)
CC'ED TO PCP 

## 2018-12-12 ENCOUNTER — Encounter: Payer: Medicare Other | Admitting: Neurology

## 2018-12-27 ENCOUNTER — Telehealth: Payer: Self-pay | Admitting: Neurology

## 2018-12-27 NOTE — Telephone Encounter (Signed)
I spoke to the patient's daughter, Maudie Mercury (on [place]). The NCV/EMG has been scheduled for 02/13/2019.

## 2018-12-27 NOTE — Telephone Encounter (Signed)
Evaluation by neurosurgeon Dr. 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

## 2019-01-15 LAB — CBC WITH DIFFERENTIAL/PLATELET
Absolute Monocytes: 901 cells/uL (ref 200–950)
Basophils Absolute: 71 cells/uL (ref 0–200)
Basophils Relative: 0.9 %
Eosinophils Absolute: 221 cells/uL (ref 15–500)
Eosinophils Relative: 2.8 %
HCT: 46.2 % — ABNORMAL HIGH (ref 35.0–45.0)
Hemoglobin: 15.7 g/dL — ABNORMAL HIGH (ref 11.7–15.5)
Lymphs Abs: 2726 cells/uL (ref 850–3900)
MCH: 33.1 pg — ABNORMAL HIGH (ref 27.0–33.0)
MCHC: 34 g/dL (ref 32.0–36.0)
MCV: 97.5 fL (ref 80.0–100.0)
MPV: 9.8 fL (ref 7.5–12.5)
Monocytes Relative: 11.4 %
Neutro Abs: 3982 cells/uL (ref 1500–7800)
Neutrophils Relative %: 50.4 %
Platelets: 312 10*3/uL (ref 140–400)
RBC: 4.74 10*6/uL (ref 3.80–5.10)
RDW: 13.2 % (ref 11.0–15.0)
Total Lymphocyte: 34.5 %
WBC: 7.9 10*3/uL (ref 3.8–10.8)

## 2019-01-15 LAB — COMPREHENSIVE METABOLIC PANEL
AG Ratio: 1.5 (calc) (ref 1.0–2.5)
ALT: 18 U/L (ref 6–29)
AST: 16 U/L (ref 10–35)
Albumin: 4 g/dL (ref 3.6–5.1)
Alkaline phosphatase (APISO): 96 U/L (ref 37–153)
BUN: 16 mg/dL (ref 7–25)
CO2: 28 mmol/L (ref 20–32)
Calcium: 9.6 mg/dL (ref 8.6–10.4)
Chloride: 102 mmol/L (ref 98–110)
Creat: 0.77 mg/dL (ref 0.50–0.99)
Globulin: 2.6 g/dL (calc) (ref 1.9–3.7)
Glucose, Bld: 87 mg/dL (ref 65–139)
Potassium: 4.3 mmol/L (ref 3.5–5.3)
Sodium: 137 mmol/L (ref 135–146)
Total Bilirubin: 0.3 mg/dL (ref 0.2–1.2)
Total Protein: 6.6 g/dL (ref 6.1–8.1)

## 2019-01-15 LAB — LIPASE: Lipase: 32 U/L (ref 7–60)

## 2019-01-15 LAB — IGA: Immunoglobulin A: 322 mg/dL — ABNORMAL HIGH (ref 70–320)

## 2019-01-15 LAB — TSH+FREE T4: TSH W/REFLEX TO FT4: 0.61 mIU/L (ref 0.40–4.50)

## 2019-01-15 LAB — TISSUE TRANSGLUTAMINASE, IGA: (tTG) Ab, IgA: 1 U/mL

## 2019-01-24 ENCOUNTER — Encounter: Payer: Self-pay | Admitting: Cardiology

## 2019-01-24 ENCOUNTER — Other Ambulatory Visit: Payer: Self-pay

## 2019-01-24 ENCOUNTER — Ambulatory Visit (INDEPENDENT_AMBULATORY_CARE_PROVIDER_SITE_OTHER): Payer: Medicare Other | Admitting: Cardiology

## 2019-01-24 VITALS — BP 136/94 | HR 93 | Temp 95.9°F | Ht 67.0 in | Wt 187.0 lb

## 2019-01-24 DIAGNOSIS — R002 Palpitations: Secondary | ICD-10-CM | POA: Diagnosis not present

## 2019-01-24 DIAGNOSIS — R079 Chest pain, unspecified: Secondary | ICD-10-CM

## 2019-01-24 NOTE — Patient Instructions (Signed)
Medication Instructions:  Your physician recommends that you continue on your current medications as directed. Please refer to the Current Medication list given to you today.   Labwork: NONE  Testing/Procedures: Your physician has recommended that you wear an event monitor. Event monitors are medical devices that record the heart's electrical activity. Doctors most often [place] these monitors to diagnose arrhythmias. Arrhythmias are problems with the speed or rhythm of the heartbeat. The monitor is a small, portable device. You can wear one while you do your normal daily activities. This is usually used to diagnose what is causing palpitations/syncope (passing out). 21 DAY    Follow-Up: Your physician recommends that you schedule a follow-up appointment in: 2 MONTHS    Any Other Special Instructions Will Be Listed Below (If Applicable).     If you need a refill on your cardiac medications before your next appointment, please call your pharmacy.

## 2019-01-24 NOTE — Progress Notes (Signed)
Clinical Summary Laura Mcgee is a 68 y.o.female seen as new consult, referred by Dr Karie Kirks for palpitations.   1. Palpitations - episodes of feeling lightheaded like she may pass out, then gets headache. Then gets palpitations.  - typically occurs at rest.  - lasts a few minutes - sporadically occur, last episode about 1 month ago  - drinks tea glass x several glasses. No sodas, no energy drinks. Drinks 4 beers per day. Takes bc powders x 2 per day.  - 06/2018 labs K 4.3  Past Medical History:  Diagnosis Date  . Depression   . Difficult intubation    History of laryngospasm  . GERD (gastroesophageal reflux disease)   . Hyperlipidemia   . Hypertension   . Numbness and tingling   . Pulmonary embolism ([place]) 1973     Allergies  Allergen Reactions  . Tetracyclines & Related Other (See Comments)    Makes her sick on her stomach      Current Outpatient Medications  Medication Sig Dispense Refill  . amLODipine (NORVASC) 10 MG tablet Take 10 mg by mouth daily.     . Aspirin-Salicylamide-Caffeine (BC HEADACHE) 325-95-16 MG TABS Take 1 packet by mouth daily.     Marland Kitchen dicyclomine (BENTYL) 10 MG capsule Take one capsule for abdominal pain or loose stools up to twice per day. 60 capsule 0  . HYDROcodone-acetaminophen (NORCO) 10-325 MG tablet Take 0.5-1 tablets by mouth every 6 (six) hours as needed.     . lovastatin (MEVACOR) 20 MG tablet Take 20 mg by mouth daily.     Marland Kitchen olmesartan (BENICAR) 20 MG tablet Take 20 mg by mouth daily.    Marland Kitchen venlafaxine XR (EFFEXOR-XR) 75 MG 24 hr capsule Take 150 mg by mouth daily with breakfast.      No current facility-administered medications for this visit.      Past Surgical History:  Procedure Laterality Date  . COLONOSCOPY WITH PROPOFOL N/A 04/07/2015   SLF: 1. seven colorectal polyps removed 2. the left colon is redundant 3. small internal hemorrhoids (1 simple adenoma, 6 hyperplastic)   . CYST EXCISION Left    Arm  .  ESOPHAGOGASTRODUODENOSCOPY (EGD) WITH PROPOFOL N/A 04/07/2015   SLF: 1. Schatzki ring 2. Bravo capsule 34 cm from the teeth 3. moderate non-erosive gastritis.   Marland Kitchen MASS EXCISION N/A 2002   Lanyx  . MASS EXCISION Left 01/21/2015   Procedure: EXCISION CYST LEFT UPPER ARM;  Surgeon: Aviva Signs, MD;  Location: [facility];  Service: General;  Laterality: Left;  Marland Kitchen MICROLARYNGOSCOPY N/A 05/31/2016   Procedure: MICRO LARYNGOSCOPY WITH BIOPSY OF VOCAL CORD LESION;  Surgeon: Leta Baptist, MD;  Location: [facility];  Service: ENT;  Laterality: N/A;  . POLYPECTOMY N/A 04/07/2015   Procedure: POLYPECTOMY;  Surgeon: Danie Binder, MD;  [facility];  Service: Endoscopy;  Laterality: N/A;  Descending colon polyps x 3   . SAVORY DILATION N/A 04/07/2015   Procedure: SAVORY DILATION;  Surgeon: Danie Binder, MD;  [facility];  Service: Endoscopy;  Laterality: N/A;     Allergies  Allergen Reactions  . Tetracyclines & Related Other (See Comments)    Makes her sick on her stomach       Family History  Problem Relation Age of Onset  . Pneumonia Mother   . Aneurysm Father   . Colon cancer Neg Hx      Social History Ms. Hirschhorn reports that she has been smoking  cigarettes. She has a 37.50 pack-year smoking history. She has never used smokeless tobacco. Ms. Stapf reports current alcohol use.   Review of Systems CONSTITUTIONAL: No weight loss, fever, chills, weakness or fatigue.  HEENT: Eyes: No visual loss, blurred vision, double vision or yellow sclerae.No hearing loss, sneezing, congestion, runny nose or sore throat.  SKIN: No rash or itching.  CARDIOVASCULAR: per hpi RESPIRATORY: No shortness of breath, cough or sputum.  GASTROINTESTINAL: No anorexia, nausea, vomiting or diarrhea. No abdominal pain or blood.  GENITOURINARY: No burning on urination, no polyuria NEUROLOGICAL: No headache, dizziness, syncope, paralysis, ataxia, numbness or tingling in the extremities. No  change in bowel or bladder control.  MUSCULOSKELETAL: No muscle, back pain, joint pain or stiffness.  LYMPHATICS: No enlarged nodes. No history of splenectomy.  PSYCHIATRIC: No history of depression or anxiety.  ENDOCRINOLOGIC: No reports of sweating, cold or heat intolerance. No polyuria or polydipsia.  Marland Kitchen   Physical Examination Today's Vitals   01/24/19 1354  BP: (!) 136/94  Pulse: 93  Temp: (!) 95.9 F (35.5 C)  SpO2: 97%  Weight: 187 lb (84.8 kg)  Height: 5\' 7"  (1.702 m)   Body mass index is 29.29 kg/m.  Gen: resting comfortably, no acute distress HEENT: no scleral icterus, pupils equal round and reactive, no palptable cervical adenopathy,  CV: RRR, no m/rg, no jvd Resp: Clear to auscultation bilaterally GI: abdomen is soft, non-tender, non-distended, normal bowel sounds, no hepatosplenomegaly MSK: extremities are warm, no edema.  Skin: warm, no rash Neuro:  no focal deficits Psych: appropriate affect    Assessment and Plan  1. Palpitations - odd contellation of dizziness, headache and palpitations episodes - we will obtain a 21 day event monitor to evaluate for any undelrying arrhythmia -relatively high caffeine and EtoH intake, cousneled on cutting back to help with symptoms - EKG today shows NSR  F/u 2 months   Arnoldo Lenis, M.D.

## 2019-02-13 ENCOUNTER — Encounter: Payer: Self-pay | Admitting: Neurology

## 2019-03-26 ENCOUNTER — Ambulatory Visit: Payer: Medicare Other | Admitting: Cardiology

## 2019-04-03 ENCOUNTER — Ambulatory Visit (INDEPENDENT_AMBULATORY_CARE_PROVIDER_SITE_OTHER): Payer: Medicare Other | Admitting: Neurology

## 2019-04-03 ENCOUNTER — Other Ambulatory Visit: Payer: Self-pay

## 2019-04-03 DIAGNOSIS — R269 Unspecified abnormalities of gait and mobility: Secondary | ICD-10-CM | POA: Diagnosis not present

## 2019-04-03 DIAGNOSIS — G629 Polyneuropathy, unspecified: Secondary | ICD-10-CM | POA: Insufficient documentation

## 2019-04-03 DIAGNOSIS — G6289 Other specified polyneuropathies: Secondary | ICD-10-CM | POA: Diagnosis not present

## 2019-04-03 DIAGNOSIS — G3281 Cerebellar ataxia in diseases classified elsewhere: Secondary | ICD-10-CM

## 2019-04-03 DIAGNOSIS — R202 Paresthesia of skin: Secondary | ICD-10-CM

## 2019-04-03 DIAGNOSIS — M48061 Spinal stenosis, lumbar region without neurogenic claudication: Secondary | ICD-10-CM | POA: Insufficient documentation

## 2019-04-03 DIAGNOSIS — R159 Full incontinence of feces: Secondary | ICD-10-CM

## 2019-04-03 NOTE — Progress Notes (Signed)
PATIENT: Laura Mcgee DOB: [birth year]  No chief complaint on file.    HISTORICAL  Laura Mcgee is a 69 year old female, seen in request by primary care doctor Lemmie Evens for evaluation of bilateral lower extremity and intermittent upper extremity paresthesia, gait abnormality, incontinence, initial evaluation was on October 04 2018.  I have reviewed and summarized the referring note from the referring physician.  She has past medical history of hypertension, hyperlipidemia, presented with gradual worsening low back pain over the past few years, she also has gradual onset bilateral feet especially since 2020, frequent bilateral calf, foot muscle cramping, also gradually experienced urinary and bowel incontinence, urgency, intermittent diarrhea mixed with constipation.  She denies significant neck pain, since 2020, she also began to experience intermittent bilateral upper extremity paresthesia, such as driving, she would develop numbness of all 5 fingers, she had to shake her hands  CT abdomen for evaluation of alternating diarrhea and constipation in May 2020 reviewed, no acute abnormality of abdomen and pelvis, severe spinal stenosis at L4- 5, moderately severe at L3-4  Update April 03, 2019: She return for electrodiagnostic study today, which showed evidence of moderate length dependent axonal sensorimotor polyneuropathy, with superimposed bilateral lumbosacral radiculopathy, mainly involving bilateral L4-5 S1 myotomes.  She did reported a history of long-term moderate to heavy alcohol drinking in the past, only recent few months, she has decreased her alcohol level to couple beers.  She continue complains of low back pain, especially after prolonged standing, walking, has urinary urgency, but no longer has continence  Laboratory evaluations in 2020, A1c 5.4, CBC showed mild elevated hemoglobin of 15.7, normal CMP, normal thyroid functional test  We have personally reviewed MRI  lumbar in August 2020: Severe multifactorial lumbar stenosis at L4-5, greater than L3-4  MRI of cervical spine mild degenerative changes, no significant foraminal and canal stenosis. She was seen by neurosurgeon Dr. Emelda Brothers on December 10, 2018, given the stocking glove pattern of numbness, suggested EMG nerve conduction study, most likely she had severe lumbar radiculopathy,  Per patient, the plan is to try epidural injection first, if failed, will proceed with lumbar decompression surgery   REVIEW OF SYSTEMS: Full 14 system review of systems performed and notable only for as above All other review of systems were negative.  ALLERGIES: Allergies  Allergen Reactions  . Tetracyclines & Related Other (See Comments)    Makes her sick on her stomach     HOME MEDICATIONS: Current Outpatient Medications  Medication Sig Dispense Refill  . amLODipine (NORVASC) 10 MG tablet Take 10 mg by mouth daily.     . Aspirin-Salicylamide-Caffeine (BC HEADACHE) 325-95-16 MG TABS Take 1 packet by mouth daily.     . baclofen (LIORESAL) 10 MG tablet Take 10 mg by mouth 3 (three) times daily as needed for muscle spasms.    Marland Kitchen dicyclomine (BENTYL) 10 MG capsule Take one capsule for abdominal pain or loose stools up to twice per day. 60 capsule 0  . HYDROcodone-acetaminophen (NORCO) 10-325 MG tablet Take 0.5-1 tablets by mouth every 6 (six) hours as needed.     . lovastatin (MEVACOR) 20 MG tablet Take 20 mg by mouth daily.     Marland Kitchen olmesartan (BENICAR) 20 MG tablet Take 20 mg by mouth daily.    Marland Kitchen venlafaxine XR (EFFEXOR-XR) 75 MG 24 hr capsule Take 150 mg by mouth daily with breakfast.      No current facility-administered medications for this visit.    PAST MEDICAL HISTORY: Past  Medical History:  Diagnosis Date  . Depression   . Difficult intubation    History of laryngospasm  . GERD (gastroesophageal reflux disease)   . Hyperlipidemia   . Hypertension   . Numbness and tingling   . Pulmonary  embolism ([place]) 1973    PAST SURGICAL HISTORY: Past Surgical History:  Procedure Laterality Date  . COLONOSCOPY WITH PROPOFOL N/A 04/07/2015   SLF: 1. seven colorectal polyps removed 2. the left colon is redundant 3. small internal hemorrhoids (1 simple adenoma, 6 hyperplastic)   . CYST EXCISION Left    Arm  . ESOPHAGOGASTRODUODENOSCOPY (EGD) WITH PROPOFOL N/A 04/07/2015   SLF: 1. Schatzki ring 2. Bravo capsule 34 cm from the teeth 3. moderate non-erosive gastritis.   Marland Kitchen MASS EXCISION N/A 2002   Lanyx  . MASS EXCISION Left 01/21/2015   Procedure: EXCISION CYST LEFT UPPER ARM;  Surgeon: Aviva Signs, MD;  Location: [facility];  Service: General;  Laterality: Left;  Marland Kitchen MICROLARYNGOSCOPY N/A 05/31/2016   Procedure: MICRO LARYNGOSCOPY WITH BIOPSY OF VOCAL CORD LESION;  Surgeon: Leta Baptist, MD;  Location: [facility];  Service: ENT;  Laterality: N/A;  . POLYPECTOMY N/A 04/07/2015   Procedure: POLYPECTOMY;  Surgeon: Danie Binder, MD;  Location: [facility];  Service: Endoscopy;  Laterality: N/A;  Descending colon polyps x 3   . SAVORY DILATION N/A 04/07/2015   Procedure: SAVORY DILATION;  Surgeon: Danie Binder, MD;  Location: [facility];  Service: Endoscopy;  Laterality: N/A;    FAMILY HISTORY: Family History  Problem Relation Age of Onset  . Pneumonia Mother   . Aneurysm Father   . Colon cancer Neg Hx     SOCIAL HISTORY: Social History   Socioeconomic History  . Marital status: Single    Spouse name: Not on file  . Number of children: 1  . Years of education: some college  . Highest education level: Not on file  Occupational History  . Occupation: Retired  Tobacco Use  . Smoking status: Current Every Day Smoker    Packs/day: 1.50    Years: 25.00    Pack years: 37.50    Types: Cigarettes  . Smokeless tobacco: Never Used  Substance and Sexual Activity  . Alcohol use: Yes    Comment: Drinks two beer per night.  . Drug use: Yes    Types: Marijuana    Comment:  daily marijuana use  . Sexual activity: Not on file  Other Topics Concern  . Not on file  Social History Narrative   Lives at home alone.   Right-handed.   Two cups sweet tea daily.   Social Determinants of Health   Financial Resource Strain:   . Difficulty of Paying Living Expenses: Not on file  Food Insecurity:   . Worried About Charity fundraiser in the Last Year: Not on file  . Ran Out of Food in the Last Year: Not on file  Transportation Needs:   . Lack of Transportation (Medical): Not on file  . Lack of Transportation (Non-Medical): Not on file  Physical Activity:   . Days of Exercise per Week: Not on file  . Minutes of Exercise per Session: Not on file  Stress:   . Feeling of Stress : Not on file  Social Connections:   . Frequency of Communication with Friends and Family: Not on file  . Frequency of Social Gatherings with Friends and Family: Not on file  . Attends Religious Services: Not on  file  . Active Member of Clubs or Organizations: Not on file  . Attends Archivist Meetings: Not on file  . Marital Status: Not on file  Intimate Partner Violence:   . Fear of Current or Ex-Partner: Not on file  . Emotionally Abused: Not on file  . Physically Abused: Not on file  . Sexually Abused: Not on file     PHYSICAL EXAM   There were no vitals filed for this visit.  Not recorded      There is no height or weight on file to calculate BMI.  PHYSICAL EXAMNIATION:  Gen: NAD, conversant, well nourised well groomed                     Cardiovascular: Regular rate rhythm, no peripheral edema, warm, nontender. Eyes: Conjunctivae clear without exudates or hemorrhage Neck: Supple, no carotid bruits. Pulmonary: Clear to auscultation bilaterally   NEUROLOGICAL EXAM:  MENTAL STATUS: Speech:    Speech is normal; fluent and spontaneous with normal comprehension.  Cognition:     Orientation to time, place and person     Normal recent and remote memory      Normal Attention span and concentration     Normal Language, naming, repeating,spontaneous speech     Fund of knowledge   CRANIAL NERVES: CN II: Visual fields are full to confrontation.  Pupils are round equal and briskly reactive to light. CN III, IV, VI: extraocular movement are normal. No ptosis. CN V: Facial sensation is intact CN VII: Face is symmetric CN VIII: Hearing is normal to rubbing fingers CN IX, X: Phonation is normal. CN XI: Head turning and shoulder shrug are intact   MOTOR: She has no significant upper extremity proximal distal muscle weakness, no significant bilateral lower extremity proximal muscle weakness, mild bilateral ankle dorsiflexion, mild to moderate bilateral toe flexion/extension weakness  REFLEXES: Reflexes are 2+ and symmetric at the biceps, triceps, knees, and absent ankles. Plantar responses are flexor.  SENSORY: Length dependent decreased to light touch, pinprick, vibratory sensation to distal leg    COORDINATION: Rapid alternating movements and fine finger movements are intact. There is no dysmetria on finger-to-nose and heel-knee-shin.    GAIT/STANCE: She needs to push on chair arm to get up from seated position, wide-based, mildly unsteady, difficulty standing up on heels, and tiptoe,  DIAGNOSTIC DATA (LABS, IMAGING, TESTING) - I reviewed patient records, labs, notes, testing and imaging myself where available.   ASSESSMENT AND PLAN  Laura Mcgee is a 69 y.o. female   Severe lumbar stenosis at L4-5, greater than L3-4,  Has been seen by neurosurgeon Dr. Venetia Constable, planning on having epidural injection first, if failed, may proceed with lumbar decompression surgery  Length dependent axonal sensorimotor polyneuropathy.  Likely related to her long-term moderate alcohol use  Laboratory evaluations to rule out other treatable etiology  Return to clinic with nurse practitioner Sarah in 4 months  Marcial Pacas, M.D. Ph.D.  [facility] [address] [place], [place] [address] Ph: [phone] Fax: [phone]  CC: Lemmie Evens, MD

## 2019-04-03 NOTE — Procedures (Signed)
Full Name: Laura Mcgee Gender: Female MRN #: [id number] Date of Birth: Aug 13, 1950    Visit Date: 04/03/2019 09:59 Age: 69 Years Examining Physician: Marcial Pacas, MD  Referring Physician: Marcial Pacas, MD History:   69 year old female with history of long-term moderate alcohol use, chronic low back pain, presenting with worsening low back pain, bilateral upper and lower extremity paresthesia, gait abnormality.  Summary of the tests:  Nerve conduction study: Bilateral sural, superficial peroneal sensory responses were absent.  Bilateral peroneal to EDB and tibial motor responses showed moderate to severely decreased CMAP amplitude, with mild to moderate slow conduction velocity.  Right ulnar sensory response showed mildly decreased snap amplitude.  Right ulnar motor responses showed normal distal latency, moderate to severely decreased CMAP amplitude.  Right ulnar motor responses were normal.  Electromyography: Selected needle examination was performed at bilateral lower extremity muscles, bilateral lumbosacral paraspinal muscles; right upper extremity muscles, right cervical paraspinal muscles. There is evidence of chronic neuropathic changes involving bilateral L4, L5, S1 myotomes.  There is no evidence of active denervation at bilateral lumbosacral myotomes.  Conclusion: This is an abnormal study.  There is electrodiagnostic evidence of length dependent mild to moderate axonal peripheral neuropathy.  There is also evidence of chronic bilateral lumbosacral radiculopathies mainly involving bilateral L4-5 S1 myotomes.    ------------------------------- Marcial Pacas, M.D. PhD [facility] [place], [place] [address] Tel: [phone] Fax: [phone]         [facility]    Nerve / Sites Muscle Latency Ref. Amplitude Ref. Rel Amp Segments Distance Velocity Ref. Area    ms ms mV mV %  cm m/s m/s mVms  R Ulnar - ADM     Wrist ADM 2.7 ?3.3 2.1 ?6.0 100 Wrist  - ADM 7   6.7     B.Elbow ADM 6.2  3.2  152 B.Elbow - Wrist 20 57 ?49 11.9     A.Elbow ADM 8.2  3.8  120 A.Elbow - B.Elbow 10 51 ?49 14.5         A.Elbow - Wrist      R Peroneal - EDB     Ankle EDB 6.4 ?6.5 0.8 ?2.0 100 Ankle - EDB 9   2.2     Fib head EDB 17.3  0.4  49.9 Fib head - Ankle 31 29 ?44 1.1     Pop fossa EDB 20.4  0.3  87.4 Pop fossa - Fib head 10 33 ?44 0.8         Pop fossa - Ankle      L Peroneal - EDB     Ankle EDB 5.1 ?6.5 1.3 ?2.0 100 Ankle - EDB 9   3.4     Fib head EDB 12.9  1.2  88.8 Fib head - Ankle 31 40 ?44 3.1     Pop fossa EDB 15.4  1.0  86.5 Pop fossa - Fib head 10 40 ?44 2.6         Pop fossa - Ankle      R Tibial - AH     Ankle AH 4.7 ?5.8 1.4 ?4.0 100 Ankle - AH 9   7.5     Pop fossa AH 16.1  1.3  91.4 Pop fossa - Ankle 39 34 ?41 5.2  L Tibial - AH     Ankle AH 4.6 ?5.8 0.9 ?4.0 100 Ankle - AH 9   2.6     Pop fossa AH 15.7  0.6  64.7 Pop fossa - Ankle 38 34 ?41 1.3                SNC    Nerve / Sites Rec. Site Peak Lat Ref.  Amp Ref. Segments Distance    ms ms V V  cm  R Radial - Anatomical snuff box (Forearm)     Forearm Wrist 2.2 ?2.9 19 ?15 Forearm - Wrist 10  R Sural - Ankle (Calf)     Calf Ankle NR ?4.4 NR ?6 Calf - Ankle 14  L Sural - Ankle (Calf)     Calf Ankle NR ?4.4 NR ?6 Calf - Ankle 14  R Superficial peroneal - Ankle     Lat leg Ankle NR ?4.4 NR ?6 Lat leg - Ankle 14  L Superficial peroneal - Ankle     Lat leg Ankle NR ?4.4 NR ?6 Lat leg - Ankle 14  R Ulnar - Orthodromic, (Dig V, Mid palm)     Dig V Wrist 2.6 ?3.1 3 ?5 Dig V - Wrist 69                 F  Wave    Nerve F Lat Ref.   ms ms  R Tibial - AH 61.8 ?56.0  L Tibial - AH 66.9 ?56.0  R Ulnar - ADM 28.3 ?32.0           EMG Summary Table    Spontaneous MUAP Recruitment  Muscle IA Fib PSW Fasc Other Amp Dur. Poly Pattern  R. Tibialis anterior Increased None None None _______ Increased Increased Normal Reduced  R. Tibialis posterior Increased None None None _______  Increased Increased 1+ Reduced  R. Peroneus longus Increased None None None _______ Increased Increased 1+ Reduced  R. Gastrocnemius (Medial head) Increased None None None _______ Increased Increased 1+ Reduced  R. Vastus lateralis Increased None None None _______ Increased Increased 1+ Reduced  L. Tibialis anterior Increased None None None _______ Increased Increased 1+ Reduced  L. Tibialis posterior Increased None None None _______ Increased Increased 1+ Reduced  L. Peroneus longus Increased None None None _______ Increased Increased 1+ Reduced  L. Gastrocnemius (Medial head) Increased None None None _______ Increased Increased 1+ Reduced  L. Vastus lateralis Increased None None None _______ Increased Increased Normal Reduced  R. Lumbar paraspinals (mid) Normal None None None _______ Normal Normal Normal Normal  R. Lumbar paraspinals (low) Normal None None None _______ Normal Normal Normal Normal  L. Lumbar paraspinals (mid) Normal None None None _______ Normal Normal Normal Normal  L. Lumbar paraspinals (low) Normal None None None _______ Normal Normal Normal Normal  R. First dorsal interosseous Increased None None None _______ Increased Increased Normal Reduced  R. Pronator teres Normal None None None _______ Normal Normal Normal Normal  R. Flexor carpi ulnaris Normal None None None _______ Normal Normal Normal Normal  R. Brachioradialis Normal None None None _______ Normal Normal Normal Normal  R. Extensor digitorum communis Normal None None None _______ Normal Normal Normal Normal  R. Deltoid Normal None None None _______ Normal Normal Normal Normal  R. Biceps brachii Normal None None None _______ Normal Normal Normal Normal  R. Triceps brachii Normal None None None _______ Normal Normal Normal Normal  R. Cervical paraspinals Normal None None None _______ Normal Normal Normal Normal

## 2019-04-09 ENCOUNTER — Telehealth: Payer: Self-pay | Admitting: Neurology

## 2019-04-09 LAB — PROTEIN ELECTROPHORESIS
A/G Ratio: 1.2 (ref 0.7–1.7)
Albumin ELP: 3.5 g/dL (ref 2.9–4.4)
Alpha 1: 0.3 g/dL (ref 0.0–0.4)
Alpha 2: 0.9 g/dL (ref 0.4–1.0)
Beta: 1 g/dL (ref 0.7–1.3)
Gamma Globulin: 0.9 g/dL (ref 0.4–1.8)
Globulin, Total: 3 g/dL (ref 2.2–3.9)

## 2019-04-09 LAB — HEMOGLOBIN A1C
Est. average glucose Bld gHb Est-mCnc: 111 mg/dL
Hgb A1c MFr Bld: 5.5 % (ref 4.8–5.6)

## 2019-04-09 LAB — SEDIMENTATION RATE: Sed Rate: 35 mm/hr (ref 0–40)

## 2019-04-09 LAB — C-REACTIVE PROTEIN: CRP: <1 mg/L (ref 0–10)

## 2019-04-09 LAB — IMMUNOFIXATION ELECTROPHORESIS
IgA/Immunoglobulin A, Serum: 369 mg/dL — ABNORMAL HIGH (ref 87–352)
IgG (Immunoglobin G), Serum: 731 mg/dL (ref 586–1602)
IgM (Immunoglobulin M), Srm: 261 mg/dL — ABNORMAL HIGH (ref 26–217)
Total Protein: 6.5 g/dL (ref 6.0–8.5)

## 2019-04-09 LAB — ANA W/REFLEX IF POSITIVE: Anti Nuclear Antibody (ANA): NEGATIVE

## 2019-04-09 LAB — RPR: RPR Ser Ql: NONREACTIVE

## 2019-04-09 LAB — VITAMIN B12: Vitamin B-12: 594 pg/mL (ref 232–1245)

## 2019-04-09 LAB — CK: Total CK: 41 U/L (ref 32–182)

## 2019-04-09 NOTE — Telephone Encounter (Signed)
Left patient a detailed message, with results, on voicemail (ok per DPR).  Provided our number to call back with any questions.  

## 2019-04-09 NOTE — Telephone Encounter (Signed)
Please call patient, extensive laboratory evaluation showed no significant abnormalities.  The mild elevated IgA, IgM antibody has unknown clinical significance.

## 2019-04-24 ENCOUNTER — Other Ambulatory Visit (HOSPITAL_COMMUNITY): Payer: Self-pay | Admitting: Nurse Practitioner

## 2019-04-24 DIAGNOSIS — M25512 Pain in left shoulder: Secondary | ICD-10-CM

## 2019-04-24 DIAGNOSIS — M79641 Pain in right hand: Secondary | ICD-10-CM

## 2019-04-24 NOTE — Telephone Encounter (Signed)
Pt LVM requesting a call back to discuss her results along with a "couple of questions regarding her last appt" please advise

## 2019-04-24 NOTE — Telephone Encounter (Signed)
The patient needed Dr. Colleen Can phone number. She is going to contact him to schedule an epidural steroid injection.

## 2019-05-06 ENCOUNTER — Other Ambulatory Visit: Payer: Self-pay

## 2019-05-06 ENCOUNTER — Ambulatory Visit (HOSPITAL_COMMUNITY)
Admission: RE | Admit: 2019-05-06 | Discharge: 2019-05-06 | Disposition: A | Discharge status: Home / Self Care | Payer: Medicare Other | Source: Ambulatory Visit | Attending: Nurse Practitioner | Admitting: Nurse Practitioner

## 2019-05-06 DIAGNOSIS — M25512 Pain in left shoulder: Secondary | ICD-10-CM

## 2019-05-06 DIAGNOSIS — M79641 Pain in right hand: Secondary | ICD-10-CM

## 2019-05-08 ENCOUNTER — Other Ambulatory Visit (HOSPITAL_COMMUNITY): Payer: Self-pay | Admitting: Family Medicine

## 2019-05-08 DIAGNOSIS — M25512 Pain in left shoulder: Secondary | ICD-10-CM

## 2019-05-15 ENCOUNTER — Ambulatory Visit: Payer: Medicare Other | Attending: Internal Medicine

## 2019-05-15 ENCOUNTER — Other Ambulatory Visit: Payer: Self-pay

## 2019-05-15 DIAGNOSIS — Z20822 Contact with and (suspected) exposure to covid-19: Secondary | ICD-10-CM

## 2019-05-16 LAB — NOVEL CORONAVIRUS, NAA: SARS-CoV-2, NAA: NOT DETECTED

## 2019-05-18 ENCOUNTER — Telehealth: Payer: Self-pay

## 2019-05-18 NOTE — Telephone Encounter (Signed)

## 2019-05-21 ENCOUNTER — Other Ambulatory Visit: Payer: Self-pay

## 2019-05-21 ENCOUNTER — Encounter (HOSPITAL_COMMUNITY): Payer: Self-pay

## 2019-05-21 ENCOUNTER — Ambulatory Visit (HOSPITAL_COMMUNITY)
Admission: RE | Admit: 2019-05-21 | Discharge: 2019-05-21 | Disposition: A | Discharge status: Home / Self Care | Payer: Medicare Other | Source: Ambulatory Visit | Attending: Family Medicine | Admitting: Family Medicine

## 2019-05-21 ENCOUNTER — Other Ambulatory Visit (HOSPITAL_COMMUNITY): Payer: Self-pay | Admitting: Family Medicine

## 2019-05-21 DIAGNOSIS — M79671 Pain in right foot: Secondary | ICD-10-CM | POA: Insufficient documentation

## 2019-05-21 DIAGNOSIS — M79674 Pain in right toe(s): Secondary | ICD-10-CM | POA: Insufficient documentation

## 2019-05-22 ENCOUNTER — Telehealth: Payer: Self-pay | Admitting: Neurology

## 2019-05-22 DIAGNOSIS — R269 Unspecified abnormalities of gait and mobility: Secondary | ICD-10-CM

## 2019-05-22 MED ORDER — PREGABALIN 25 MG PO CAPS: 25.0000 mg | ORAL_CAPSULE | Freq: Three times a day (TID) | ORAL | 5 refills | Status: DC

## 2019-05-22 NOTE — Telephone Encounter (Signed)
I have ERX lyrica 25mg  tid

## 2019-05-22 NOTE — Telephone Encounter (Signed)
She tried to take gabapentin 100mg  TID but the medication cause intolerable dizziness. She is unwilling to try it again at any dosage. She would like to get an alternate medication for her neuropathic pain.

## 2019-05-22 NOTE — Telephone Encounter (Addendum)
I spoke to the patient. She is agreeable to try the Lyrica 25mg , one capsule TID. She will call back if her symptoms do not improve. She is concerned about the side effects of the medication. I advised her to titrate up slowly to decrease the risk. She was agreeable to this plan.

## 2019-05-22 NOTE — Telephone Encounter (Signed)
Pt called wanting to know what is going to be done for her neuropathy because the medication that was given to her is making her more off balance than she was before. Please advise.

## 2019-05-22 NOTE — Addendum Note (Signed)
Addended by: Marcial Pacas on: 05/22/2019 05:34 PM   Modules accepted: Orders

## 2019-05-23 NOTE — Telephone Encounter (Signed)
Pt called back to report she would like RX for cane sent to [facility]

## 2019-05-23 NOTE — Addendum Note (Signed)
Addended by: Desmond Lope on: 05/23/2019 04:29 PM   Modules accepted: Orders

## 2019-05-23 NOTE — Telephone Encounter (Signed)
Ok, per vo by Dr. Krista Blue, to provide this prescription. The patient is aware it has been faxed to [facility].

## 2019-05-23 NOTE — Telephone Encounter (Signed)
Pt has called asking for a Rx for a cane, please call to discuss

## 2019-05-23 NOTE — Telephone Encounter (Signed)
I called the patient back to clarify her request. She would like a prescription for a four prong cane sent to [facility].

## 2019-05-31 ENCOUNTER — Ambulatory Visit (HOSPITAL_COMMUNITY): Payer: Medicare Other

## 2019-06-04 ENCOUNTER — Ambulatory Visit: Payer: Medicare Other | Admitting: Orthopaedic Surgery

## 2019-06-07 ENCOUNTER — Other Ambulatory Visit (HOSPITAL_COMMUNITY): Payer: Self-pay | Admitting: Family Medicine

## 2019-06-07 DIAGNOSIS — Z1231 Encounter for screening mammogram for malignant neoplasm of breast: Secondary | ICD-10-CM

## 2019-06-17 ENCOUNTER — Ambulatory Visit (HOSPITAL_COMMUNITY): Payer: Medicare Other

## 2019-06-18 ENCOUNTER — Ambulatory Visit: Payer: Medicare Other | Admitting: Orthopaedic Surgery

## 2019-07-02 ENCOUNTER — Encounter (HOSPITAL_COMMUNITY): Payer: Self-pay

## 2019-07-02 ENCOUNTER — Ambulatory Visit (HOSPITAL_COMMUNITY): Admission: RE | Admit: 2019-07-02 | Payer: Medicare Other | Source: Ambulatory Visit

## 2019-07-03 ENCOUNTER — Ambulatory Visit (HOSPITAL_COMMUNITY)
Admission: RE | Admit: 2019-07-03 | Discharge: 2019-07-03 | Disposition: A | Discharge status: Home / Self Care | Payer: Medicare Other | Source: Ambulatory Visit | Attending: Family Medicine | Admitting: Family Medicine

## 2019-07-03 ENCOUNTER — Other Ambulatory Visit: Payer: Self-pay

## 2019-07-03 ENCOUNTER — Other Ambulatory Visit: Payer: Self-pay | Admitting: Family Medicine

## 2019-07-03 ENCOUNTER — Encounter (HOSPITAL_COMMUNITY): Payer: Self-pay

## 2019-07-03 DIAGNOSIS — R1012 Left upper quadrant pain: Secondary | ICD-10-CM

## 2019-07-03 DIAGNOSIS — R1013 Epigastric pain: Secondary | ICD-10-CM

## 2019-07-04 ENCOUNTER — Encounter: Payer: Self-pay | Admitting: Orthopaedic Surgery

## 2019-07-04 ENCOUNTER — Ambulatory Visit: Payer: Medicare Other | Admitting: Orthopaedic Surgery

## 2019-07-09 ENCOUNTER — Ambulatory Visit (HOSPITAL_COMMUNITY): Payer: Medicare Other | Attending: Nurse Practitioner | Admitting: Physical Therapy

## 2019-07-09 ENCOUNTER — Other Ambulatory Visit: Payer: Self-pay

## 2019-07-09 ENCOUNTER — Encounter (HOSPITAL_COMMUNITY): Payer: Self-pay | Admitting: Physical Therapy

## 2019-07-09 DIAGNOSIS — R262 Difficulty in walking, not elsewhere classified: Secondary | ICD-10-CM | POA: Insufficient documentation

## 2019-07-09 DIAGNOSIS — R29898 Other symptoms and signs involving the musculoskeletal system: Secondary | ICD-10-CM | POA: Insufficient documentation

## 2019-07-09 DIAGNOSIS — M6281 Muscle weakness (generalized): Secondary | ICD-10-CM | POA: Insufficient documentation

## 2019-07-09 DIAGNOSIS — R2681 Unsteadiness on feet: Secondary | ICD-10-CM | POA: Diagnosis present

## 2019-07-09 NOTE — Therapy (Signed)
[name], [place], [address] Phone: [phone]   Fax:  [phone]  Physical Therapy Evaluation  Patient Details  Name: Laura Mcgee MRN: [id number] Date of Birth: [birth year] Referring Provider (PT): Carlis Abbott   Encounter Date: 07/09/2019  PT End of Session - 07/09/19 1355    Visit Number  1    Number of Visits  12    Date for PT Re-Evaluation  08/20/19    Progress Note Due on Visit  10    PT Start Time  1510    PT Stop Time  [id number]    PT Time Calculation (min)  38 min       Past Medical History:  Diagnosis Date  . Depression   . Difficult intubation    History of laryngospasm  . GERD (gastroesophageal reflux disease)   . Hyperlipidemia   . Hypertension   . Numbness and tingling   . Pulmonary embolism ([place]) 1973    Past Surgical History:  Procedure Laterality Date  . COLONOSCOPY WITH PROPOFOL N/A 04/07/2015   SLF: 1. seven colorectal polyps removed 2. the left colon is redundant 3. small internal hemorrhoids (1 simple adenoma, 6 hyperplastic)   . CYST EXCISION Left    Arm  . ESOPHAGOGASTRODUODENOSCOPY (EGD) WITH PROPOFOL N/A 04/07/2015   SLF: 1. Schatzki ring 2. Bravo capsule 34 cm from the teeth 3. moderate non-erosive gastritis.   Marland Kitchen MASS EXCISION N/A 2002   Lanyx  . MASS EXCISION Left 01/21/2015   Procedure: EXCISION CYST LEFT UPPER ARM;  Surgeon: Aviva Signs, MD;  Location: [facility];  Service: General;  Laterality: Left;  Marland Kitchen MICROLARYNGOSCOPY N/A 05/31/2016   Procedure: MICRO LARYNGOSCOPY WITH BIOPSY OF VOCAL CORD LESION;  Surgeon: Leta Baptist, MD;  Location: [facility];  Service: ENT;  Laterality: N/A;  . POLYPECTOMY N/A 04/07/2015   Procedure: POLYPECTOMY;  Surgeon: Danie Binder, MD;  [facility];  Service: Endoscopy;  Laterality: N/A;  Descending colon polyps x 3   . SAVORY DILATION N/A 04/07/2015   Procedure: SAVORY DILATION;  Surgeon: Danie Binder, MD;  [facility];  Service: Endoscopy;  Laterality: N/A;    There were no vitals filed for this visit.   Subjective Assessment - 07/09/19 1055    Subjective  Pt states that she has a long hx of feeling off balance.  She has been seen by two different ENT who felt that it was not related to BPPV. Her neurologist states that it is due to her neruopathy and. would like her to try therapy to decrease her risk of falling    Pertinent History  LT shoulder pain, falls, ataxia, LBP with hx of injections in March    Limitations  Standing;House hold activities    How long can you sit comfortably?  no problem    How long can you stand comfortably?  no problem since the injections    How long can you walk comfortably?  no longer than 10 minutes    Patient Stated Goals  fall less, less instability when wqalking    Currently in Pain?  No/denies   Pain in arm but we will not be addressing this        [facility] PT Assessment - 07/09/19 0001      Assessment   Medical Diagnosis  instability of gait     Referring Provider (PT)  Carlis Abbott    Onset Date/Surgical Date  06/06/19    Prior Therapy  one visit       Precautions   Precautions  Fall      Restrictions   Weight Bearing Restrictions  No      Balance Screen   Has the patient fallen in the past 6 months  Yes    How many times?  1    Has the patient had a decrease in activity level because of a fear of falling?   Yes    Is the patient reluctant to leave their home because of a fear of falling?   No      Home Environment   Living Environment  Private residence    Type of Home  Mobile home    Home Access  Stairs to enter    Entrance Stairs-Number of Steps  4    Entrance Stairs-Rails  Right      Prior Function   Level of Independence  Independent      Cognition   Overall Cognitive Status  Within Functional Limits for tasks assessed      Observation/Other Assessments   Focus on Therapeutic Outcomes (FOTO)   47; 53% limited        Functional Tests   Functional tests  Single leg stance;Sit to Stand      Single Leg Stance   Comments  Lt :  0 ; Rt 2 seconds       Sit to Stand   Comments  7 in 30 seconds       ROM / Strength   AROM / PROM / Strength  Strength      Strength   Strength Assessment Site  Hip;Knee    Right/Left Hip  Right;Left    Right Hip Flexion  3/5    Right Hip Extension  3/5    Right Hip ABduction  3/5    Left Hip Flexion  3/5    Left Hip Extension  3/5    Left Hip ABduction  4/5    Right/Left Knee  Right;Left    Right Knee Flexion  4-/5    Right Knee Extension  4/5    Left Knee Flexion  4+/5    Left Knee Extension  4-/5      Ambulation/Gait   Ambulation Distance (Feet)  234 Feet    Gait Comments  2' walk test.      Balance   Balance Assessed  Yes      Dynamic Gait Index   Level Surface  Mild Impairment    Change in Gait Speed  Mild Impairment    Gait with Horizontal Head Turns  Moderate Impairment    Gait with Vertical Head Turns  Moderate Impairment    Gait and Pivot Turn  Moderate Impairment    Step Over Obstacle  Moderate Impairment    Step Around Obstacles  Moderate Impairment    Steps  Moderate Impairment    Total Score  10      Functional Gait  Assessment   Gait assessed   --                Objective measurements completed on examination: See above findings.           Balance Exercises - 07/09/19 1353      Balance Exercises: Standing   Tandem Stance  Eyes open;2 reps   semitandem    Heel Raises  Both;5 reps    Sit to Stand  Standard surface   x  5       PT Education - 07/09/19 1354    Education Details  HEP    Person(s) Educated  Patient    Methods  Explanation    Comprehension  Verbalized understanding       PT Short Term Goals - 07/09/19 1626      PT SHORT TERM GOAL #1   Title  Pt will be able to perform bil SLS for 10 sec in order to demo improved hip and ankle stability to reduce risk of falling.    Time  3    Period  Weeks     Status  New    Target Date  07/30/19      PT SHORT TERM GOAL #2   Title  PT LE and core strength to improve by 1/2 grade to allow pt to be able to go up and down 4 steps with one hand hold assist in confidence.    Time  2    Period  Weeks    Status  New      PT SHORT TERM GOAL #3   Title  Pt will be able to perform 10 STS during 30sec chair rise test to deo improved functional strength and balance in order to reduce her risk for falls.    Time  2    Period  Weeks    Status  New        PT Long Term Goals - 07/09/19 1628      PT LONG TERM GOAL #1   Title  Pt will have improved MMT by 1 grade in order to maximize her gait and reduce her risk for falls.    Time  6    Period  Weeks    Status  New    Target Date  08/20/19      PT LONG TERM GOAL #2   Title  Pt will score 20/24 on the DGI in order to further demo improved dynamic balance further reduce her risk for falls.    Time  6    Period  Weeks    Status  New      PT LONG TERM GOAL #3   Title  Pt will be able to perform bil SLS for 15 sec or > to further demo improved balance and reduce her risk for falls.    Time  6    Period  Weeks    Status  New      PT LONG TERM GOAL #4   Title  Pt will subjectively report 50% reduction in feelings of unsteadiness/off balance during gait and when standing after sitting for long periods of time to reduce her risk for falls    Time  6    Period  Weeks    Status  New      PT LONG TERM GOAL #5   Title  PT to be able to ambulate 336 ft in 2 minutes period of time to demonstrate improved confidence and stability with ambulation.             Plan - 07/09/19 1356    Clinical Impression Statement  Ms. Gonce is a 69 yo female who has been diagnosed with peripheral neuropathy and has been having progressive gait instability.  She is currently being referred to skilled PT to improve her balance and gait.  Evaluation demonstrates decreased balance, decreased LE and core strength.  Ms.  Nilsson will benefit from skilled PT to address these issues  to decrease her risk of falling and improve her confidence in ambulation.    Personal Factors and Comorbidities  Comorbidity 3+    Comorbidities  spinal stenosis, peripheral neuropathy LPB, long time opiate use.    Examination-Activity Limitations  Bend;Locomotion Level;Lift;Stairs    Examination-Participation Restrictions  Cleaning;Community Activity;Laundry    Stability/Clinical Decision Making  Evolving/Moderate complexity    Clinical Decision Making  Low    Rehab Potential  Good    PT Frequency  2x / week    PT Duration  6 weeks    PT Treatment/Interventions  Therapeutic exercise;Therapeutic activities;Balance training;Neuromuscular re-education;Patient/family education;Stair training;Functional mobility training;Gait training    PT Next Visit Plan  begin rockerboard, lunges with long hold for balance, tandem stance, tandem gt, retro gt. progress strength and balance as able.    PT Home Exercise Plan  heelraises, sit to stand and semi tandem stance.       Patient will benefit from skilled therapeutic intervention in order to improve the following deficits and impairments:  Abnormal gait, Decreased activity tolerance, Decreased balance, Decreased strength, Difficulty walking  Visit Diagnosis: Unsteadiness on feet - Plan: PT plan of care cert/re-cert  Muscle weakness (generalized) - Plan: PT plan of care cert/re-cert  Other symptoms and signs involving the musculoskeletal system - Plan: PT plan of care cert/re-cert     Problem List Patient Active Problem List   Diagnosis Date Noted  . Peripheral neuropathy 04/03/2019  . Spinal stenosis at L4-L5 level 04/03/2019  . RUQ pain 11/20/2018  . Change in bowel function 11/20/2018  . Paresthesia 10/04/2018  . Gait abnormality 10/04/2018  . Incontinence of feces 10/04/2018  . Chronic cough 04/27/2016  . Constipation 04/27/2016  . GERD (gastroesophageal reflux disease)  03/30/2015  . Dysphagia 03/30/2015  . Encounter for screening colonoscopy 03/30/2015    Rayetta Humphrey, PT CLT [phone] 07/09/2019, 4:34 PM  [facility] [address] [place], [place], [address] Phone: [phone]   Fax:  [phone]  Name: Laura Mcgee MRN: [id number] Date of Birth: 21-Mar-1950

## 2019-07-11 ENCOUNTER — Ambulatory Visit (HOSPITAL_COMMUNITY): Payer: Medicare Other | Admitting: Physical Therapy

## 2019-07-11 ENCOUNTER — Telehealth (HOSPITAL_COMMUNITY): Payer: Self-pay | Admitting: Physical Therapy

## 2019-07-11 NOTE — Telephone Encounter (Signed)
S/w Daughter ( Ms. Marijean Bravo) she will text her mom and let her know not to come today - she requested that we continue to call to offer the Friday apptment times available

## 2019-07-16 ENCOUNTER — Ambulatory Visit (HOSPITAL_COMMUNITY): Payer: Medicare Other

## 2019-07-18 ENCOUNTER — Ambulatory Visit (HOSPITAL_COMMUNITY): Payer: Medicare Other

## 2019-07-18 ENCOUNTER — Telehealth (HOSPITAL_COMMUNITY): Payer: Self-pay

## 2019-07-18 NOTE — Telephone Encounter (Signed)
pt cancelled appt for today because she has no gas

## 2019-07-23 ENCOUNTER — Ambulatory Visit (HOSPITAL_COMMUNITY): Payer: Medicare Other

## 2019-07-23 ENCOUNTER — Encounter (HOSPITAL_COMMUNITY): Payer: Self-pay

## 2019-07-23 ENCOUNTER — Other Ambulatory Visit: Payer: Self-pay

## 2019-07-23 DIAGNOSIS — M6281 Muscle weakness (generalized): Secondary | ICD-10-CM

## 2019-07-23 DIAGNOSIS — R262 Difficulty in walking, not elsewhere classified: Secondary | ICD-10-CM

## 2019-07-23 DIAGNOSIS — R2681 Unsteadiness on feet: Secondary | ICD-10-CM

## 2019-07-23 DIAGNOSIS — R29898 Other symptoms and signs involving the musculoskeletal system: Secondary | ICD-10-CM

## 2019-07-23 NOTE — Patient Instructions (Signed)
Toe / Heel Raise (Standing)    Standing with support, raise heels, then rock back on heels and raise toes. Repeat 10 times.  Copyright  VHI. All rights reserved.   Tandem Stance    Right foot in front of left, heel touching toe both feet "straight ahead". Stand on Foot Triangle of Support with both feet. Balance in this position 30 seconds. Do with left foot in front of right.  Copyright  VHI. All rights reserved.    Functional Quadriceps: Sit to Stand    Sit on edge of chair, feet flat on floor. Stand upright, extending knees fully. Repeat 10 times per set. Do 2 sets per day. http://orth.exer.us/735   Copyright  VHI. All rights reserved.

## 2019-07-23 NOTE — Therapy (Signed)
[facility] [address] [place], [place], [address] Phone: [phone]   Fax:  [phone]  Physical Therapy Treatment  Patient Details  Name: Laura Mcgee MRN: [id number] Date of Birth: [birth year] Referring Provider (PT): Carlis Abbott   Encounter Date: 07/23/2019  PT End of Session - 07/23/19 1548    Visit Number  2    Number of Visits  12    Date for PT Re-Evaluation  08/20/19    Authorization Type  [employer]; medicaid secondary    Progress Note Due on Visit  10    PT Start Time  1544   pt late for apt   PT Stop Time  1615    PT Time Calculation (min)  31 min    Activity Tolerance  Patient tolerated treatment well    Behavior During Therapy  [facility] for tasks assessed/performed       Past Medical History:  Diagnosis Date  . Depression   . Difficult intubation    History of laryngospasm  . GERD (gastroesophageal reflux disease)   . Hyperlipidemia   . Hypertension   . Numbness and tingling   . Pulmonary embolism ([place]) 1973    Past Surgical History:  Procedure Laterality Date  . COLONOSCOPY WITH PROPOFOL N/A 04/07/2015   SLF: 1. seven colorectal polyps removed 2. the left colon is redundant 3. small internal hemorrhoids (1 simple adenoma, 6 hyperplastic)   . CYST EXCISION Left    Arm  . ESOPHAGOGASTRODUODENOSCOPY (EGD) WITH PROPOFOL N/A 04/07/2015   SLF: 1. Schatzki ring 2. Bravo capsule 34 cm from the teeth 3. moderate non-erosive gastritis.   Marland Kitchen MASS EXCISION N/A 2002   Lanyx  . MASS EXCISION Left 01/21/2015   Procedure: EXCISION CYST LEFT UPPER ARM;  Surgeon: Aviva Signs, MD;  Location: [facility];  Service: General;  Laterality: Left;  Marland Kitchen MICROLARYNGOSCOPY N/A 05/31/2016   Procedure: MICRO LARYNGOSCOPY WITH BIOPSY OF VOCAL CORD LESION;  Surgeon: Leta Baptist, MD;  Location: [facility];  Service: ENT;  Laterality: N/A;  . POLYPECTOMY N/A 04/07/2015   Procedure: POLYPECTOMY;  Surgeon: Danie Binder, MD;  [facility];  Service: Endoscopy;  Laterality: N/A;  Descending colon polyps x 3   . SAVORY DILATION N/A 04/07/2015   Procedure: SAVORY DILATION;  Surgeon: Danie Binder, MD;  [facility];  Service: Endoscopy;  Laterality: N/A;    There were no vitals filed for this visit.  Subjective Assessment - 07/23/19 1546    Subjective  Pt late for apt.  Reports she has constant dizziness and feels off balalnce.  No reoprts of recent falls, stated she has been compliant with STS exercise daily, has began any other exercises at home.  Reports she has increased SOB and constant headache, plans to talk to MD.    Pertinent History  LT shoulder pain, falls, ataxia, LBP with hx of injections in March    Patient Stated Goals  fall less, less instability when wqalking    Currently in Pain?  No/denies   c/o dizziness, SOB and headache                       [facility] Adult PT Treatment/Exercise - 07/23/19 0001      Exercises   Exercises  Knee/Hip      Knee/Hip Exercises: Standing   Heel Raises  10 reps    Forward Lunges  2 sets;5 reps    Forward  Lunges Limitations  10" holds, no HHA, on 4in step          Balance Exercises - 07/23/19 1558      Balance Exercises: Standing   Tandem Stance  Eyes open;2 reps;30 secs   semitandem   Heel Raises  Both;10 reps    Sit to Stand  Standard surface   10x eccentric control       PT Education - 07/23/19 1552    Education Details  Reviewed goals, educated importance of HEP compliance, pt able to recall 1/3 HEP exercises.  Reviewed specific HEP exercise, pt able to demonstrate following verbal cueing and demonstration.    Person(s) Educated  Patient    Methods  Explanation;Demonstration;Verbal cues    Comprehension  Verbalized understanding;Returned demonstration;Verbal cues required       PT Short Term Goals - 07/09/19 1626      PT SHORT TERM GOAL #1   Title  Pt will be able to perform bil SLS for 10 sec in order to demo  improved hip and ankle stability to reduce risk of falling.    Time  3    Period  Weeks    Status  New    Target Date  07/30/19      PT SHORT TERM GOAL #2   Title  PT LE and core strength to improve by 1/2 grade to allow pt to be able to go up and down 4 steps with one hand hold assist in confidence.    Time  2    Period  Weeks    Status  New      PT SHORT TERM GOAL #3   Title  Pt will be able to perform 10 STS during 30sec chair rise test to deo improved functional strength and balance in order to reduce her risk for falls.    Time  2    Period  Weeks    Status  New        PT Long Term Goals - 07/09/19 1628      PT LONG TERM GOAL #1   Title  Pt will have improved MMT by 1 grade in order to maximize her gait and reduce her risk for falls.    Time  6    Period  Weeks    Status  New    Target Date  08/20/19      PT LONG TERM GOAL #2   Title  Pt will score 20/24 on the DGI in order to further demo improved dynamic balance further reduce her risk for falls.    Time  6    Period  Weeks    Status  New      PT LONG TERM GOAL #3   Title  Pt will be able to perform bil SLS for 15 sec or > to further demo improved balance and reduce her risk for falls.    Time  6    Period  Weeks    Status  New      PT LONG TERM GOAL #4   Title  Pt will subjectively report 50% reduction in feelings of unsteadiness/off balance during gait and when standing after sitting for long periods of time to reduce her risk for falls    Time  6    Period  Weeks    Status  New      PT LONG TERM GOAL #5   Title  PT to be able to ambulate 336 ft in  2 minutes period of time to demonstrate improved confidence and stability with ambulation.            Plan - 07/23/19 1555    Clinical Impression Statement  Reviewed goals, educated importance of HEP compliance, pt able to recall 1/3 HEP exercises.  Reviewed specific HEP exercise, pt able to demonstrate following verbal cueing and demonstration,  additional handout given for improved follow-up.  Session focus on balance training with min A/HHA for LOB episodes.    Personal Factors and Comorbidities  Comorbidity 3+    Comorbidities  spinal stenosis, peripheral neuropathy LPB, long time opiate use.    Examination-Activity Limitations  Bend;Locomotion Level;Lift;Stairs    Examination-Participation Restrictions  Cleaning;Community Activity;Laundry    Stability/Clinical Decision Making  Evolving/Moderate complexity    Clinical Decision Making  Low    Rehab Potential  Good    PT Frequency  2x / week    PT Duration  6 weeks    PT Treatment/Interventions  Therapeutic exercise;Therapeutic activities;Balance training;Neuromuscular re-education;Patient/family education;Stair training;Functional mobility training;Gait training    PT Next Visit Plan  F/u wiht HEP complaince.  Continues with lunges with long duration, tandem stance, tandem gt, retro gt. progress strength and balance as able.    PT Home Exercise Plan  heelraises, sit to stand and semi tandem stance.       Patient will benefit from skilled therapeutic intervention in order to improve the following deficits and impairments:  Abnormal gait, Decreased activity tolerance, Decreased balance, Decreased strength, Difficulty walking  Visit Diagnosis: Unsteadiness on feet  Muscle weakness (generalized)  Other symptoms and signs involving the musculoskeletal system  Difficulty in walking, not elsewhere classified     Problem List Patient Active Problem List   Diagnosis Date Noted  . Peripheral neuropathy 04/03/2019  . Spinal stenosis at L4-L5 level 04/03/2019  . RUQ pain 11/20/2018  . Change in bowel function 11/20/2018  . Paresthesia 10/04/2018  . Gait abnormality 10/04/2018  . Incontinence of feces 10/04/2018  . Chronic cough 04/27/2016  . Constipation 04/27/2016  . GERD (gastroesophageal reflux disease) 03/30/2015  . Dysphagia 03/30/2015  . Encounter for screening  colonoscopy 03/30/2015   Ihor [place], LPTA/CLT; CBIS [phone]  Aldona Lento 07/23/2019, 4:30 PM  [facility] [place], [place], [address] Phone: [phone]   Fax:  [phone]  Name: Laura Mcgee MRN: [id number] Date of Birth: March 07, 1951

## 2019-07-26 ENCOUNTER — Telehealth (HOSPITAL_COMMUNITY): Payer: Self-pay | Admitting: Physical Therapy

## 2019-07-26 ENCOUNTER — Ambulatory Visit (HOSPITAL_COMMUNITY): Payer: Medicare Other | Admitting: Physical Therapy

## 2019-07-26 NOTE — Telephone Encounter (Signed)
pt called to cx today's appt due to something has come up

## 2019-07-29 ENCOUNTER — Ambulatory Visit: Payer: Medicare Other | Admitting: Neurology

## 2019-07-30 ENCOUNTER — Ambulatory Visit (HOSPITAL_COMMUNITY): Payer: Medicare Other

## 2019-07-30 ENCOUNTER — Encounter (HOSPITAL_COMMUNITY): Payer: Self-pay

## 2019-07-30 ENCOUNTER — Other Ambulatory Visit: Payer: Self-pay

## 2019-07-30 DIAGNOSIS — R29898 Other symptoms and signs involving the musculoskeletal system: Secondary | ICD-10-CM

## 2019-07-30 DIAGNOSIS — M6281 Muscle weakness (generalized): Secondary | ICD-10-CM

## 2019-07-30 DIAGNOSIS — R2681 Unsteadiness on feet: Secondary | ICD-10-CM | POA: Diagnosis not present

## 2019-07-30 DIAGNOSIS — R262 Difficulty in walking, not elsewhere classified: Secondary | ICD-10-CM

## 2019-07-30 NOTE — Therapy (Signed)
[facility] [address] [place], [place], [address] Phone: [phone]   Fax:  [phone]  Physical Therapy Treatment  Patient Details  Name: Laura Mcgee MRN: [id number] Date of Birth: [birth year] Referring Provider (PT): Carlis Abbott   Encounter Date: 07/30/2019  PT End of Session - 07/30/19 1542    Visit Number  3    Number of Visits  12    Date for PT Re-Evaluation  08/20/19    Authorization Type  [employer]; medicaid secondary    Progress Note Due on Visit  10    PT Start Time  1543   late for apt then restroom break   PT Stop Time  1616    PT Time Calculation (min)  33 min    Equipment Utilized During Treatment  Gait belt    Activity Tolerance  Patient tolerated treatment well;Patient limited by fatigue;No increased pain    Behavior During Therapy  WFL for tasks assessed/performed       Past Medical History:  Diagnosis Date  . Depression   . Difficult intubation    History of laryngospasm  . GERD (gastroesophageal reflux disease)   . Hyperlipidemia   . Hypertension   . Numbness and tingling   . Pulmonary embolism ([place]) 1973    Past Surgical History:  Procedure Laterality Date  . COLONOSCOPY WITH PROPOFOL N/A 04/07/2015   SLF: 1. seven colorectal polyps removed 2. the left colon is redundant 3. small internal hemorrhoids (1 simple adenoma, 6 hyperplastic)   . CYST EXCISION Left    Arm  . ESOPHAGOGASTRODUODENOSCOPY (EGD) WITH PROPOFOL N/A 04/07/2015   SLF: 1. Schatzki ring 2. Bravo capsule 34 cm from the teeth 3. moderate non-erosive gastritis.   Marland Kitchen MASS EXCISION N/A 2002   Lanyx  . MASS EXCISION Left 01/21/2015   Procedure: EXCISION CYST LEFT UPPER ARM;  Surgeon: Aviva Signs, MD;  Location: [facility];  Service: General;  Laterality: Left;  Marland Kitchen MICROLARYNGOSCOPY N/A 05/31/2016   Procedure: MICRO LARYNGOSCOPY WITH BIOPSY OF VOCAL CORD LESION;  Surgeon: Leta Baptist, MD;  Location: [facility];  Service: ENT;   Laterality: N/A;  . POLYPECTOMY N/A 04/07/2015   Procedure: POLYPECTOMY;  Surgeon: Danie Binder, MD;  [facility]: [facility];  Service: Endoscopy;  Laterality: N/A;  Descending colon polyps x 3   . SAVORY DILATION N/A 04/07/2015   Procedure: SAVORY DILATION;  Surgeon: Danie Binder, MD;  [facility];  Service: Endoscopy;  Laterality: N/A;    There were no vitals filed for this visit.  Subjective Assessment - 07/30/19 1539    Subjective  Pt late for apt.  Stated the dizziness has reduced some, was bad on Sunday.  Reports she has been practicing STS at home, no other exercises.    Pertinent History  LT shoulder pain, falls, ataxia, LBP with hx of injections in March    Patient Stated Goals  fall less, less instability when wqalking    Currently in Pain?  Yes    Pain Score  7     Pain Location  Back    Pain Orientation  Right;Mid    Pain Descriptors / Indicators  Aching    Pain Type  Chronic pain    Pain Onset  More than a month ago    Pain Frequency  Constant    Aggravating Factors   sleeping on couch    Pain Relieving Factors  meds  [facility] Adult PT Treatment/Exercise - 07/30/19 0001      Exercises   Exercises  Knee/Hip      Knee/Hip Exercises: Standing   Heel Raises  15 reps    Heel Raises Limitations  toe raises 15 on slope    Forward Lunges  15 reps    Forward Lunges Limitations  10" holds, no HHA    Functional Squat  10 reps    Functional Squat Limitations  front of chair, cueing for mechanics          Balance Exercises - 07/30/19 1602      Balance Exercises: Standing   Tandem Stance  Eyes open;Intermittent upper extremity support;3 reps;30 secs    Tandem Gait  Forward;2 reps    Retro Gait  2 reps    Sidestepping  2 reps;Theraband    Theraband Level (Sidestepping)  Level 2 (Red)    Heel Raises  Both;10 reps    Toe Raise  10 reps   slope   Sit to Stand  Standard surface;Without upper extremity support   10  STS no HHA       PT Education - 07/30/19 1548    Education Details  Reviewed importance of HEP compliance, pt able to recall 1/3 HEP exercises.  Educated importance of HEP compliance for maximal benefits    Person(s) Educated  Patient    Methods  Explanation    Comprehension  Verbalized understanding       PT Short Term Goals - 07/09/19 1626      PT SHORT TERM GOAL #1   Title  Pt will be able to perform bil SLS for 10 sec in order to demo improved hip and ankle stability to reduce risk of falling.    Time  3    Period  Weeks    Status  New    Target Date  07/30/19      PT SHORT TERM GOAL #2   Title  PT LE and core strength to improve by 1/2 grade to allow pt to be able to go up and down 4 steps with one hand hold assist in confidence.    Time  2    Period  Weeks    Status  New      PT SHORT TERM GOAL #3   Title  Pt will be able to perform 10 STS during 30sec chair rise test to deo improved functional strength and balance in order to reduce her risk for falls.    Time  2    Period  Weeks    Status  New        PT Long Term Goals - 07/09/19 1628      PT LONG TERM GOAL #1   Title  Pt will have improved MMT by 1 grade in order to maximize her gait and reduce her risk for falls.    Time  6    Period  Weeks    Status  New    Target Date  08/20/19      PT LONG TERM GOAL #2   Title  Pt will score 20/24 on the DGI in order to further demo improved dynamic balance further reduce her risk for falls.    Time  6    Period  Weeks    Status  New      PT LONG TERM GOAL #3   Title  Pt will be able to perform bil SLS for 15 sec or > to  further demo improved balance and reduce her risk for falls.    Time  6    Period  Weeks    Status  New      PT LONG TERM GOAL #4   Title  Pt will subjectively report 50% reduction in feelings of unsteadiness/off balance during gait and when standing after sitting for long periods of time to reduce her risk for falls    Time  6    Period   Weeks    Status  New      PT LONG TERM GOAL #5   Title  PT to be able to ambulate 336 ft in 2 minutes period of time to demonstrate improved confidence and stability with ambulation.            Plan - 07/30/19 1626    Clinical Impression Statement  Educated importance of HEP compliance for maximal benefits, verbalized understanding.  Session focus on balance training with additional gait based balance activities.  Min A for LOB episodes for safety, cueing to look up to improve visual awareness.  Pt was limited by fatigue required a few rest breaks, no reports of increased pain through session.    Personal Factors and Comorbidities  Comorbidity 3+    Comorbidities  spinal stenosis, peripheral neuropathy LPB, long time opiate use.    Examination-Activity Limitations  Bend;Locomotion Level;Lift;Stairs    Examination-Participation Restrictions  Cleaning;Community Activity;Laundry    Stability/Clinical Decision Making  Evolving/Moderate complexity    Clinical Decision Making  Low    Rehab Potential  Good    PT Frequency  2x / week    PT Duration  6 weeks    PT Treatment/Interventions  Therapeutic exercise;Therapeutic activities;Balance training;Neuromuscular re-education;Patient/family education;Stair training;Functional mobility training;Gait training    PT Next Visit Plan  F/u wiht HEP complaince.  Continues with lunges with long duration, tandem stance, tandem gt, retro gt. progress strength and balance as able.    PT Home Exercise Plan  heelraises, sit to stand and semi tandem stance.       Patient will benefit from skilled therapeutic intervention in order to improve the following deficits and impairments:  Abnormal gait, Decreased activity tolerance, Decreased balance, Decreased strength, Difficulty walking  Visit Diagnosis: Unsteadiness on feet  Muscle weakness (generalized)  Other symptoms and signs involving the musculoskeletal system  Difficulty in walking, not elsewhere  classified     Problem List Patient Active Problem List   Diagnosis Date Noted  . Peripheral neuropathy 04/03/2019  . Spinal stenosis at L4-L5 level 04/03/2019  . RUQ pain 11/20/2018  . Change in bowel function 11/20/2018  . Paresthesia 10/04/2018  . Gait abnormality 10/04/2018  . Incontinence of feces 10/04/2018  . Chronic cough 04/27/2016  . Constipation 04/27/2016  . GERD (gastroesophageal reflux disease) 03/30/2015  . Dysphagia 03/30/2015  . Encounter for screening colonoscopy 03/30/2015   Ihor Austin, LPTA/CLT; CBIS [phone]  Aldona Lento 07/30/2019, 4:29 PM  [facility] [address] [place], [place], [address] Phone: [phone]   Fax:  [phone]  Name: Heartly Hauptman MRN: [id number] Date of Birth: 1950/11/29

## 2019-07-31 ENCOUNTER — Encounter: Payer: Self-pay | Admitting: Student

## 2019-07-31 ENCOUNTER — Ambulatory Visit: Payer: Medicare Other | Admitting: Student

## 2019-07-31 NOTE — Progress Notes (Deleted)
Cardiology Office Note    Date:  07/31/2019   ID:  Laura Mcgee, DOB 03-25-1950, MRN [id number]  PCP:  Lemmie Evens, MD  Cardiologist: Carlyle Dolly, MD    No chief complaint on file.   History of Present Illness:    Laura Mcgee is a 69 y.o. female with past medical history of HTN, HLD and palpitations who presents to the office today for overdue follow-up.  She was last examined by Dr. Harl Bowie in 01/2019 as a new patient referral for palpitations. She reported having episodes of feeling lightheaded with associated presyncope, headaches and palpitations. She was consuming several glasses of tea a day along with 4 beers per day. EKG at the time of her visit showed normal sinus rhythm and it was recommended that she wear a 21-day event monitor to exclude any underlying arrhythmias along with reducing caffeine and alcohol intake.   Past Medical History:  Diagnosis Date  . Depression   . Difficult intubation    History of laryngospasm  . GERD (gastroesophageal reflux disease)   . Hyperlipidemia   . Hypertension   . Numbness and tingling   . Pulmonary embolism ([facility]) 1973    Past Surgical History:  Procedure Laterality Date  . COLONOSCOPY WITH PROPOFOL N/A 04/07/2015   SLF: 1. seven colorectal polyps removed 2. the left colon is redundant 3. small internal hemorrhoids (1 simple adenoma, 6 hyperplastic)   . CYST EXCISION Left    Arm  . ESOPHAGOGASTRODUODENOSCOPY (EGD) WITH PROPOFOL N/A 04/07/2015   SLF: 1. Schatzki ring 2. Bravo capsule 34 cm from the teeth 3. moderate non-erosive gastritis.   Marland Kitchen MASS EXCISION N/A 2002   Lanyx  . MASS EXCISION Left 01/21/2015   Procedure: EXCISION CYST LEFT UPPER ARM;  Surgeon: Aviva Signs, MD;  Location: [facility];  Service: General;  Laterality: Left;  Marland Kitchen MICROLARYNGOSCOPY N/A 05/31/2016   Procedure: MICRO LARYNGOSCOPY WITH BIOPSY OF VOCAL CORD LESION;  Surgeon: Leta Baptist, MD;  Location: [facility];  Service: ENT;  Laterality:  N/A;  . POLYPECTOMY N/A 04/07/2015   Procedure: POLYPECTOMY;  Surgeon: Danie Binder, MD;  [facility];  Service: Endoscopy;  Laterality: N/A;  Descending colon polyps x 3   . SAVORY DILATION N/A 04/07/2015   Procedure: SAVORY DILATION;  Surgeon: Danie Binder, MD;  [facility];  Service: Endoscopy;  Laterality: N/A;    Current Medications: Outpatient Medications Prior to Visit  Medication Sig Dispense Refill  . amLODipine (NORVASC) 10 MG tablet Take 10 mg by mouth daily.     . Aspirin-Salicylamide-Caffeine (BC HEADACHE) 325-95-16 MG TABS Take 1 packet by mouth daily.     . baclofen (LIORESAL) 10 MG tablet Take 10 mg by mouth 3 (three) times daily as needed for muscle spasms.    Marland Kitchen dicyclomine (BENTYL) 10 MG capsule Take one capsule for abdominal pain or loose stools up to twice per day. 60 capsule 0  . HYDROcodone-acetaminophen (NORCO) 10-325 MG tablet Take 0.5-1 tablets by mouth every 6 (six) hours as needed.     . lovastatin (MEVACOR) 20 MG tablet Take 20 mg by mouth daily.     Marland Kitchen olmesartan (BENICAR) 20 MG tablet Take 20 mg by mouth daily.    . pregabalin (LYRICA) 25 MG capsule Take 1 capsule (25 mg total) by mouth 3 (three) times daily. 90 capsule 5  . venlafaxine (EFFEXOR) 75 MG tablet Take 75 mg by mouth 2 (two) times daily.  No facility-administered medications prior to visit.     Allergies:   Gabapentin and Tetracyclines & related   Social History   Socioeconomic History  . Marital status: Single    Spouse name: Not on file  . Number of children: 1  . Years of education: some college  . Highest education level: Not on file  Occupational History  . Occupation: Retired  Tobacco Use  . Smoking status: Current Every Day Smoker    Packs/day: 1.50    Years: 25.00    Pack years: 37.50    Types: Cigarettes  . Smokeless tobacco: Never Used  Substance and Sexual Activity  . Alcohol use: Yes    Comment: Drinks two beer per night.  . Drug use: Yes      Types: Marijuana    Comment: daily marijuana use  . Sexual activity: Not on file  Other Topics Concern  . Not on file  Social History Narrative   Lives at home alone.   Right-handed.   Two cups sweet tea daily.   Social Determinants of Health   Financial Resource Strain:   . Difficulty of Paying Living Expenses:   Food Insecurity:   . Worried About Charity fundraiser in the Last Year:   . Arboriculturist in the Last Year:   Transportation Needs:   . Film/video editor (Medical):   Marland Kitchen Lack of Transportation (Non-Medical):   Physical Activity:   . Days of Exercise per Week:   . Minutes of Exercise per Session:   Stress:   . Feeling of Stress :   Social Connections:   . Frequency of Communication with Friends and Family:   . Frequency of Social Gatherings with Friends and Family:   . Attends Religious Services:   . Active Member of Clubs or Organizations:   . Attends Archivist Meetings:   Marland Kitchen Marital Status:      Family History:  The patient's ***family history includes Aneurysm in her father; Pneumonia in her mother.   Review of Systems:   Please see the history of present illness.     General:  No chills, fever, night sweats or weight changes.  Cardiovascular:  No chest pain, dyspnea on exertion, edema, orthopnea, palpitations, paroxysmal nocturnal dyspnea. Dermatological: No rash, lesions/masses Respiratory: No cough, dyspnea Urologic: No hematuria, dysuria Abdominal:   No nausea, vomiting, diarrhea, bright red blood per rectum, melena, or hematemesis Neurologic:  No visual changes, wkns, changes in mental status. All other systems reviewed and are otherwise negative except as noted above.   Physical Exam:    VS:  LMP 06/20/1997 (Approximate) Comment: post menopausal   General: Well developed, well nourished,female appearing in no acute distress. Head: Normocephalic, atraumatic, sclera non-icteric.  Neck: No carotid bruits. JVD not elevated.   Lungs: Respirations regular and unlabored, without wheezes or rales.  Heart: ***Regular rate and rhythm. No S3 or S4.  No murmur, no rubs, or gallops appreciated. Abdomen: Soft, non-tender, non-distended. No obvious abdominal masses. Msk:  Strength and tone appear normal for age. No obvious joint deformities or effusions. Extremities: No clubbing or cyanosis. No edema.  Distal pedal pulses are 2+ bilaterally. Neuro: Alert and oriented X 3. Moves all extremities spontaneously. No focal deficits noted. Psych:  Responds to questions appropriately with a normal affect. Skin: No rashes or lesions noted  Wt Readings from Last 3 Encounters:  01/24/19 187 lb (84.8 kg)  11/20/18 184 lb 12.8 oz (83.8 kg)  10/04/18 184  lb (83.5 kg)        Studies/Labs Reviewed:   EKG:  EKG is*** ordered today.  The ekg ordered today demonstrates ***  Recent Labs: 01/14/2019: ALT 18; BUN 16; Creat 0.77; Hemoglobin 15.7; Platelets 312; Potassium 4.3; Sodium 137   Lipid Panel    Component Value Date/Time   CHOL 186 06/06/2018 0000   TRIG 100 06/06/2018 0000   HDL 54 06/06/2018 0000   LDLCALC 100 06/06/2018 0000    Additional studies/ records that were reviewed today include:   Echocardiogram: 10/2017 Study Conclusions   - Left ventricle: The cavity size was normal. Wall thickness was  increased in a pattern of mild LVH. Systolic function was normal.  The estimated ejection fraction was in the range of 60% to 65%.  Doppler parameters are consistent with abnormal left ventricular  relaxation (grade 1 diastolic dysfunction).  - Aortic valve: Valve area (VTI): 3.11 cm^2. Valve area (Vmax):  2.89 cm^2. Valve area (Vmean): 2.47 cm^2.  - Atrial septum: No defect or patent foramen ovale was identified.  - Technically difficult study.   Assessment:    No diagnosis found.   Plan:   In order of problems listed above:  1. ***    Medication Adjustments/Labs and Tests Ordered: Current  medicines are reviewed at length with the patient today.  Concerns regarding medicines are outlined above.  Medication changes, Labs and Tests ordered today are listed in the Patient Instructions below. There are no Patient Instructions on file for this visit.   Signed, Erma Heritage, PA-C  07/31/2019 10:42 AM    [facility] [address] [place], [place] [address] Phone: [phone] Fax: [phone]

## 2019-08-01 ENCOUNTER — Ambulatory Visit (HOSPITAL_COMMUNITY): Payer: Medicare Other

## 2019-08-01 ENCOUNTER — Telehealth (HOSPITAL_COMMUNITY): Payer: Self-pay

## 2019-08-01 NOTE — Telephone Encounter (Signed)
No show, called and left message concerning missed apt today.  Included next apt date and time with contact information given.    Ihor Austin, LPTA/CLT; Delana Meyer [phone]

## 2019-08-06 ENCOUNTER — Telehealth (HOSPITAL_COMMUNITY): Payer: Self-pay | Admitting: Physical Therapy

## 2019-08-06 ENCOUNTER — Ambulatory Visit (HOSPITAL_COMMUNITY): Payer: Medicare Other | Admitting: Physical Therapy

## 2019-08-06 NOTE — Telephone Encounter (Signed)
pt cancelled appt for today because she has diarrhea

## 2019-08-07 ENCOUNTER — Other Ambulatory Visit: Payer: Self-pay

## 2019-08-07 ENCOUNTER — Ambulatory Visit (HOSPITAL_COMMUNITY)
Admission: RE | Admit: 2019-08-07 | Discharge: 2019-08-07 | Disposition: A | Discharge status: Home / Self Care | Payer: Medicare Other | Source: Ambulatory Visit | Attending: Family Medicine | Admitting: Family Medicine

## 2019-08-07 DIAGNOSIS — M25512 Pain in left shoulder: Secondary | ICD-10-CM

## 2019-08-08 ENCOUNTER — Ambulatory Visit (HOSPITAL_COMMUNITY): Payer: Medicare Other | Attending: Nurse Practitioner

## 2019-08-08 ENCOUNTER — Encounter (HOSPITAL_COMMUNITY): Payer: Self-pay

## 2019-08-08 DIAGNOSIS — R2681 Unsteadiness on feet: Secondary | ICD-10-CM | POA: Insufficient documentation

## 2019-08-08 DIAGNOSIS — M25512 Pain in left shoulder: Secondary | ICD-10-CM | POA: Insufficient documentation

## 2019-08-08 DIAGNOSIS — G8929 Other chronic pain: Secondary | ICD-10-CM | POA: Diagnosis present

## 2019-08-08 DIAGNOSIS — M25612 Stiffness of left shoulder, not elsewhere classified: Secondary | ICD-10-CM | POA: Diagnosis present

## 2019-08-08 DIAGNOSIS — R29898 Other symptoms and signs involving the musculoskeletal system: Secondary | ICD-10-CM | POA: Insufficient documentation

## 2019-08-08 DIAGNOSIS — M6281 Muscle weakness (generalized): Secondary | ICD-10-CM | POA: Insufficient documentation

## 2019-08-08 DIAGNOSIS — R262 Difficulty in walking, not elsewhere classified: Secondary | ICD-10-CM | POA: Diagnosis present

## 2019-08-08 NOTE — Therapy (Addendum)
[facility] [address] [place], [place], [address] Phone: [phone]   Fax:  [phone]  Physical Therapy Treatment and Discharge Note  Patient Details  Name: Laura Mcgee MRN: [id number] Date of Birth: [birth year] Referring Provider (PT): Carlis Abbott  PHYSICAL THERAPY DISCHARGE SUMMARY  Visits from Start of Care: 4  Current functional level related to goals / functional outcomes: Could not be reassessed secondary to unplanned discharge as patient did not return to therapy.    Remaining deficits: Could not be reassessed secondary to unplanned discharge as patient did not return to therapy.    Education / Equipment: Could not provide additional education secondary to unplanned discharge Plan: Patient agrees to discharge.  Patient goals were not met. Patient is being discharged due to meeting the stated rehab goals.  ?????   Patient has been treated in therapy for 4 visits and has not been seen since 08/08/19. All follow up appointments were cancelled with most cancellations occurring day. Due to certification out of date and attendance policy patient is discharged at this time.    4:20 PM, 09/26/19 Jerene Pitch, DPT Physical Therapy with [facility]  [phone] office     Encounter Date: 08/08/2019  PT End of Session - 08/08/19 1544    Visit Number  4    Number of Visits  12    Date for PT Re-Evaluation  08/20/19    Authorization Type  [employer]; medicaid secondary    Progress Note Due on Visit  10    PT Start Time  1540    PT Stop Time  1620    PT Time Calculation (min)  40 min    Equipment Utilized During Treatment  Gait belt    Activity Tolerance  Patient tolerated treatment well;Patient limited by fatigue;No increased pain    Behavior During Therapy  WFL for tasks assessed/performed       Past Medical History:  Diagnosis Date  . Depression   . Difficult intubation    History of laryngospasm   . GERD (gastroesophageal reflux disease)   . Hyperlipidemia   . Hypertension   . Numbness and tingling   . Pulmonary embolism ([place]) 1973    Past Surgical History:  Procedure Laterality Date  . COLONOSCOPY WITH PROPOFOL N/A 04/07/2015   SLF: 1. seven colorectal polyps removed 2. the left colon is redundant 3. small internal hemorrhoids (1 simple adenoma, 6 hyperplastic)   . CYST EXCISION Left    Arm  . ESOPHAGOGASTRODUODENOSCOPY (EGD) WITH PROPOFOL N/A 04/07/2015   SLF: 1. Schatzki ring 2. Bravo capsule 34 cm from the teeth 3. moderate non-erosive gastritis.   Marland Kitchen MASS EXCISION N/A 2002   Lanyx  . MASS EXCISION Left 01/21/2015   Procedure: EXCISION CYST LEFT UPPER ARM;  Surgeon: Aviva Signs, MD;  Location: [facility];  Service: General;  Laterality: Left;  Marland Kitchen MICROLARYNGOSCOPY N/A 05/31/2016   Procedure: MICRO LARYNGOSCOPY WITH BIOPSY OF VOCAL CORD LESION;  Surgeon: Leta Baptist, MD;  Location: [facility];  Service: ENT;  Laterality: N/A;  . POLYPECTOMY N/A 04/07/2015   Procedure: POLYPECTOMY;  Surgeon: Danie Binder, MD;  [facility];  Service: Endoscopy;  Laterality: N/A;  Descending colon polyps x 3   . SAVORY DILATION N/A 04/07/2015   Procedure: SAVORY DILATION;  Surgeon: Danie Binder, MD;  [facility];  Service: Endoscopy;  Laterality: N/A;    There were no vitals filed for this visit.  Subjective Assessment - 08/08/19 1542    Subjective  Pt stated she has some abdominal discomfort today.  Has began tandem stance at home with difficulty.    Pertinent History  LT shoulder pain, falls, ataxia, LBP with hx of injections in March    Patient Stated Goals  fall less, less instability when wqalking    Currently in Pain?  Yes    Pain Score  3     Pain Location  Abdomen    Pain Onset  More than a month ago    Pain Frequency  Intermittent                        [facility] Adult PT Treatment/Exercise - 08/08/19 0001      Exercises    Exercises  Knee/Hip      Knee/Hip Exercises: Standing   Heel Raises  15 reps    Forward Lunges  15 reps    Forward Lunges Limitations  10" holds, no HHA    Functional Squat  2 sets;10 reps    Functional Squat Limitations  front of chair, cueing for mechanics          Balance Exercises - 08/08/19 1544      Balance Exercises: Standing   Tandem Stance  Eyes open;Intermittent upper extremity support;3 reps;30 secs;2 reps;Foam/compliant surface   2 reps on foam wiht intermittent HHA   SLS  5 reps   7" max   Balance Beam  tandem and side step    Tandem Gait  Forward;Retro;2 reps    Sit to Stand  Standard surface;Without upper extremity support   eccentric control 10x    Other Standing Exercises  toe tapping forward and lateral 20x each LE          PT Short Term Goals - 07/09/19 1626      PT SHORT TERM GOAL #1   Title  Pt will be able to perform bil SLS for 10 sec in order to demo improved hip and ankle stability to reduce risk of falling.    Time  3    Period  Weeks    Status  New    Target Date  07/30/19      PT SHORT TERM GOAL #2   Title  PT LE and core strength to improve by 1/2 grade to allow pt to be able to go up and down 4 steps with one hand hold assist in confidence.    Time  2    Period  Weeks    Status  New      PT SHORT TERM GOAL #3   Title  Pt will be able to perform 10 STS during 30sec chair rise test to deo improved functional strength and balance in order to reduce her risk for falls.    Time  2    Period  Weeks    Status  New        PT Long Term Goals - 07/09/19 1628      PT LONG TERM GOAL #1   Title  Pt will have improved MMT by 1 grade in order to maximize her gait and reduce her risk for falls.    Time  6    Period  Weeks    Status  New    Target Date  08/20/19      PT LONG TERM GOAL #2   Title  Pt will score 20/24 on the DGI in order to  further demo improved dynamic balance further reduce her risk for falls.    Time  6    Period   Weeks    Status  New      PT LONG TERM GOAL #3   Title  Pt will be able to perform bil SLS for 15 sec or > to further demo improved balance and reduce her risk for falls.    Time  6    Period  Weeks    Status  New      PT LONG TERM GOAL #4   Title  Pt will subjectively report 50% reduction in feelings of unsteadiness/off balance during gait and when standing after sitting for long periods of time to reduce her risk for falls    Time  6    Period  Weeks    Status  New      PT LONG TERM GOAL #5   Title  PT to be able to ambulate 336 ft in 2 minutes period of time to demonstrate improved confidence and stability with ambulation.            Plan - 08/08/19 1718    Clinical Impression Statement  Pt improving static and dynamic balance wiht NBOS, increased difficulty wiht SLS activities.  Progressed to balance beam this session with min A for safety.  Added SLS to HEP, encouraged to complete by sink or counter for safety with verbalized understanding.    Personal Factors and Comorbidities  Comorbidity 3+    Comorbidities  spinal stenosis, peripheral neuropathy LPB, long time opiate use.    Examination-Activity Limitations  Bend;Locomotion Level;Lift;Stairs    Examination-Participation Restrictions  Cleaning;Community Activity;Laundry    Stability/Clinical Decision Making  Evolving/Moderate complexity    Clinical Decision Making  Low    Rehab Potential  Good    PT Frequency  2x / week    PT Duration  6 weeks    PT Treatment/Interventions  Therapeutic exercise;Therapeutic activities;Balance training;Neuromuscular re-education;Patient/family education;Stair training;Functional mobility training;Gait training    PT Next Visit Plan  Add vector stance.  Continue functional strengthening and progress balance as able.    PT Home Exercise Plan  heelraises, sit to stand and semi tandem stance.; 08/08/19: SLS       Patient will benefit from skilled therapeutic intervention in order to improve  the following deficits and impairments:  Abnormal gait, Decreased activity tolerance, Decreased balance, Decreased strength, Difficulty walking  Visit Diagnosis: Other symptoms and signs involving the musculoskeletal system  Difficulty in walking, not elsewhere classified  Unsteadiness on feet  Muscle weakness (generalized)     Problem List Patient Active Problem List   Diagnosis Date Noted  . Peripheral neuropathy 04/03/2019  . Spinal stenosis at L4-L5 level 04/03/2019  . RUQ pain 11/20/2018  . Change in bowel function 11/20/2018  . Paresthesia 10/04/2018  . Gait abnormality 10/04/2018  . Incontinence of feces 10/04/2018  . Chronic cough 04/27/2016  . Constipation 04/27/2016  . GERD (gastroesophageal reflux disease) 03/30/2015  . Dysphagia 03/30/2015  . Encounter for screening colonoscopy 03/30/2015   Ihor Austin, LPTA/CLT; CBIS [phone]  Aldona Lento 08/08/2019, 5:23 PM  [facility] [address] [place], [place], [address] Phone: [phone]   Fax:  [phone]  Name: Laura Mcgee MRN: [id number] Date of Birth: 1951-03-02

## 2019-08-08 NOTE — Patient Instructions (Signed)
Single Leg Balance: Eyes Open    Stand on right leg with eyes open. Hold 30 seconds. 5 reps every day.  http://ggbe.exer.us/5   Copyright  VHI. All rights reserved.

## 2019-08-13 ENCOUNTER — Encounter: Payer: Self-pay | Admitting: Orthopaedic Surgery

## 2019-08-13 ENCOUNTER — Ambulatory Visit (INDEPENDENT_AMBULATORY_CARE_PROVIDER_SITE_OTHER): Payer: Medicare Other | Admitting: Orthopaedic Surgery

## 2019-08-13 ENCOUNTER — Ambulatory Visit (HOSPITAL_COMMUNITY): Payer: Medicare Other

## 2019-08-13 ENCOUNTER — Other Ambulatory Visit: Payer: Self-pay

## 2019-08-13 ENCOUNTER — Telehealth (HOSPITAL_COMMUNITY): Payer: Self-pay

## 2019-08-13 VITALS — BP 130/86 | HR 99 | Ht 67.0 in | Wt 191.0 lb

## 2019-08-13 DIAGNOSIS — M25512 Pain in left shoulder: Secondary | ICD-10-CM | POA: Diagnosis not present

## 2019-08-13 DIAGNOSIS — G8929 Other chronic pain: Secondary | ICD-10-CM | POA: Diagnosis not present

## 2019-08-13 NOTE — Telephone Encounter (Signed)
She did not want to do PT today due to getting a shot from Dr. Luna Glasgow

## 2019-08-13 NOTE — Progress Notes (Signed)
Subjective:    Patient ID: Laura Mcgee, female    DOB: May 09, 1950, 69 y.o.   MRN: [id number]  HPI She has pain of the left shoulder from fall in November 2020.  She has seen Dr. Karie Kirks.  He got a MRI of the left shoulder on 08-08-2019 showing: IMPRESSION: 1. Partial thickness articular surface tearing of the distal supraspinatus tendon anteriorly with mild supraspinatus tendinopathy. 2. Mild tendinopathy of the intra-articular segment of the long head of the biceps. 3. Moderate degenerative chondral thinning in the glenohumeral joint. 4. The labrum is clearly degenerated, but further assessment of the labrum is problematic due to motion artifact. 5. Possible mild synovitis in the rotator interval. 6. Despite efforts by the technologist and patient, motion artifact is present on today's exam and could not be eliminated. This reduces exam sensitivity and specificity.  I have informed her of the findings.  I have independently reviewed the MRI.  I have reviewed her notes.  She continues to have pain to the left shoulder.  She cannot take NSAIDs.  I will inject the shoulder, set her up for OT.  She may need surgery but usually the OT will help make her shoulder better.  She understands.      Review of Systems  Constitutional: Positive for activity change.  Musculoskeletal: Positive for arthralgias, joint swelling and myalgias.  All other systems reviewed and are negative.  For Review of Systems, all other systems reviewed and are negative.  The following is a summary of the past history medically, past history surgically, known current medicines, social history and family history.  This information is gathered electronically by the computer from prior information and documentation.  I review this each visit and have found including this information at this point in the chart is beneficial and informative.   Past Medical History:  Diagnosis Date  . Depression   .  Difficult intubation    History of laryngospasm  . GERD (gastroesophageal reflux disease)   . Hyperlipidemia   . Hypertension   . Numbness and tingling   . Pulmonary embolism ([facility]) 1973    Past Surgical History:  Procedure Laterality Date  . COLONOSCOPY WITH PROPOFOL N/A 04/07/2015   SLF: 1. seven colorectal polyps removed 2. the left colon is redundant 3. small internal hemorrhoids (1 simple adenoma, 6 hyperplastic)   . CYST EXCISION Left    Arm  . ESOPHAGOGASTRODUODENOSCOPY (EGD) WITH PROPOFOL N/A 04/07/2015   SLF: 1. Schatzki ring 2. Bravo capsule 34 cm from the teeth 3. moderate non-erosive gastritis.   Marland Kitchen MASS EXCISION N/A 2002   Lanyx  . MASS EXCISION Left 01/21/2015   Procedure: EXCISION CYST LEFT UPPER ARM;  Surgeon: Aviva Signs, MD;  [facility];  Service: General;  Laterality: Left;  Marland Kitchen MICROLARYNGOSCOPY N/A 05/31/2016   Procedure: MICRO LARYNGOSCOPY WITH BIOPSY OF VOCAL CORD LESION;  Surgeon: Leta Baptist, MD;  Location: [facility];  Service: ENT;  Laterality: N/A;  . POLYPECTOMY N/A 04/07/2015   Procedure: POLYPECTOMY;  Surgeon: Danie Binder, MD;  [facility];  Service: Endoscopy;  Laterality: N/A;  Descending colon polyps x 3   . SAVORY DILATION N/A 04/07/2015   Procedure: SAVORY DILATION;  Surgeon: Danie Binder, MD;  [facility];  Service: Endoscopy;  Laterality: N/A;    Current Outpatient Medications on File Prior to Visit  Medication Sig Dispense Refill  . amLODipine (NORVASC) 10 MG tablet Take 10 mg by mouth daily.     Marland Kitchen  baclofen (LIORESAL) 10 MG tablet Take 10 mg by mouth 3 (three) times daily as needed for muscle spasms.    Marland Kitchen HYDROcodone-acetaminophen (NORCO) 10-325 MG tablet Take 0.5-1 tablets by mouth every 6 (six) hours as needed.     . lovastatin (MEVACOR) 20 MG tablet Take 20 mg by mouth daily.     Marland Kitchen olmesartan (BENICAR) 20 MG tablet Take 20 mg by mouth daily.    Marland Kitchen venlafaxine (EFFEXOR) 75 MG tablet Take 75 mg by mouth  2 (two) times daily.     No current facility-administered medications on file prior to visit.    Social History   Socioeconomic History  . Marital status: Single    Spouse name: Not on file  . Number of children: 1  . Years of education: some college  . Highest education level: Not on file  Occupational History  . Occupation: Retired  Tobacco Use  . Smoking status: Current Every Day Smoker    Packs/day: 1.50    Years: 25.00    Pack years: 37.50    Types: Cigarettes  . Smokeless tobacco: Never Used  Substance and Sexual Activity  . Alcohol use: Yes    Comment: Drinks two beer per night.  . Drug use: Yes    Types: Marijuana    Comment: daily marijuana use  . Sexual activity: Not on file  Other Topics Concern  . Not on file  Social History Narrative   Lives at home alone.   Right-handed.   Two cups sweet tea daily.   Social Determinants of Health   Financial Resource Strain:   . Difficulty of Paying Living Expenses:   Food Insecurity:   . Worried About Charity fundraiser in the Last Year:   . Arboriculturist in the Last Year:   Transportation Needs:   . Film/video editor (Medical):   Marland Kitchen Lack of Transportation (Non-Medical):   Physical Activity:   . Days of Exercise per Week:   . Minutes of Exercise per Session:   Stress:   . Feeling of Stress :   Social Connections:   . Frequency of Communication with Friends and Family:   . Frequency of Social Gatherings with Friends and Family:   . Attends Religious Services:   . Active Member of Clubs or Organizations:   . Attends Archivist Meetings:   Marland Kitchen Marital Status:   Intimate Partner Violence:   . Fear of Current or Ex-Partner:   . Emotionally Abused:   Marland Kitchen Physically Abused:   . Sexually Abused:     Family History  Problem Relation Age of Onset  . Pneumonia Mother   . Aneurysm Father   . Colon cancer Neg Hx     BP 130/86   Pulse 99   Ht 5\' 7"  (1.702 m)   Wt 191 lb (86.6 kg)   LMP  06/20/1997 (Approximate) Comment: post menopausal  BMI 29.91 kg/m   Body mass index is 29.91 kg/m.     Objective:   Physical Exam Vitals and nursing note reviewed.  Constitutional:      Appearance: She is well-developed.  HENT:     Head: Normocephalic and atraumatic.  Eyes:     Conjunctiva/sclera: Conjunctivae normal.     Pupils: Pupils are equal, round, and reactive to light.  Cardiovascular:     Rate and Rhythm: Normal rate and regular rhythm.  Pulmonary:     Effort: Pulmonary effort is normal.  Abdominal:  Palpations: Abdomen is soft.  Musculoskeletal:       Arms:     Cervical back: Normal range of motion and neck supple.  Skin:    General: Skin is warm and dry.  Neurological:     Mental Status: She is alert and oriented to person, place, and time.     Cranial Nerves: No cranial nerve deficit.     Motor: No abnormal muscle tone.     Coordination: Coordination normal.     Deep Tendon Reflexes: Reflexes are normal and symmetric. Reflexes normal.  Psychiatric:        Behavior: Behavior normal.        Thought Content: Thought content normal.        Judgment: Judgment normal.           Assessment & Plan:   Encounter Diagnosis  Name Primary?  . Chronic pain in left shoulder Yes   I will begin OT.  PROCEDURE NOTE:  The patient request injection, verbal consent was obtained.  The left shoulder was prepped appropriately after time out was performed.   Sterile technique was observed and injection of 1 cc of Depo-Medrol 40 mg with several cc's of plain xylocaine. Anesthesia was provided by ethyl chloride and a 20-gauge needle was used to inject the shoulder area. A posterior approach was used.  The injection was tolerated well.  A band aid dressing was applied.  The patient was advised to apply ice later today and tomorrow to the injection sight as needed.  Return in three weeks.  Call if any problem.  Precautions discussed.   Electronically  Signed Sanjuana Kava, MD 6/8/20212:06 PM

## 2019-08-15 ENCOUNTER — Ambulatory Visit (HOSPITAL_COMMUNITY): Payer: Medicare Other | Admitting: Physical Therapy

## 2019-08-15 ENCOUNTER — Telehealth (HOSPITAL_COMMUNITY): Payer: Self-pay | Admitting: Physical Therapy

## 2019-08-20 ENCOUNTER — Telehealth (HOSPITAL_COMMUNITY): Payer: Self-pay | Admitting: Physical Therapy

## 2019-08-20 ENCOUNTER — Ambulatory Visit (HOSPITAL_COMMUNITY): Payer: Medicare Other | Admitting: Specialist

## 2019-08-20 ENCOUNTER — Ambulatory Visit (HOSPITAL_COMMUNITY): Payer: Medicare Other | Admitting: Physical Therapy

## 2019-08-20 ENCOUNTER — Telehealth (HOSPITAL_COMMUNITY): Payer: Self-pay | Admitting: Specialist

## 2019-08-20 NOTE — Telephone Encounter (Signed)
pt having  stomach problems.

## 2019-08-20 NOTE — Telephone Encounter (Signed)
Error wrong chart Rayetta Humphrey, [facility] CLT [phone]

## 2019-08-22 ENCOUNTER — Telehealth (HOSPITAL_COMMUNITY): Payer: Self-pay | Admitting: Physical Therapy

## 2019-08-22 ENCOUNTER — Ambulatory Visit (HOSPITAL_COMMUNITY): Payer: Medicare Other | Admitting: Physical Therapy

## 2019-08-22 NOTE — Telephone Encounter (Signed)
Called and left message re missed appointment.  Pt was given next appointment and asked to please call if she is not going to be able to make that appointment.  Rayetta Humphrey, [place] CLT [phone]

## 2019-08-27 ENCOUNTER — Ambulatory Visit (HOSPITAL_COMMUNITY): Payer: Medicare Other

## 2019-08-28 ENCOUNTER — Other Ambulatory Visit: Payer: Self-pay

## 2019-08-28 ENCOUNTER — Ambulatory Visit (HOSPITAL_COMMUNITY): Payer: Medicare Other | Admitting: Physical Therapy

## 2019-08-28 ENCOUNTER — Encounter (HOSPITAL_COMMUNITY): Payer: Self-pay | Admitting: Specialist

## 2019-08-28 ENCOUNTER — Ambulatory Visit (HOSPITAL_COMMUNITY): Payer: Medicare Other | Admitting: Specialist

## 2019-08-28 DIAGNOSIS — R29898 Other symptoms and signs involving the musculoskeletal system: Secondary | ICD-10-CM | POA: Diagnosis not present

## 2019-08-28 DIAGNOSIS — G8929 Other chronic pain: Secondary | ICD-10-CM

## 2019-08-28 DIAGNOSIS — M25612 Stiffness of left shoulder, not elsewhere classified: Secondary | ICD-10-CM

## 2019-08-28 NOTE — Therapy (Signed)
[place] [place], [place], [address] Phone: [phone]   Fax:  [phone]  Occupational Therapy Evaluation  Patient Details  Name: Laura Mcgee MRN: [id number] Date of Birth: 1951-01-31 Referring Provider (OT): Dr. Sanjuana Kava   Encounter Date: 08/28/2019   OT End of Session - 08/28/19 1251    Visit Number 1    Number of Visits 2    Date for OT Re-Evaluation 10/02/19    Authorization Type [employer] Medicare and Medicaid    Progress Note Due on Visit 10    OT Start Time 1040    OT Stop Time 1115    OT Time Calculation (min) 35 min    Activity Tolerance Patient tolerated treatment well    Behavior During Therapy [facility] for tasks assessed/performed           Past Medical History:  Diagnosis Date  . Depression   . Difficult intubation    History of laryngospasm  . GERD (gastroesophageal reflux disease)   . Hyperlipidemia   . Hypertension   . Numbness and tingling   . Pulmonary embolism ([place]) 1973    Past Surgical History:  Procedure Laterality Date  . COLONOSCOPY WITH PROPOFOL N/A 04/07/2015   SLF: 1. seven colorectal polyps removed 2. the left colon is redundant 3. small internal hemorrhoids (1 simple adenoma, 6 hyperplastic)   . CYST EXCISION Left    Arm  . ESOPHAGOGASTRODUODENOSCOPY (EGD) WITH PROPOFOL N/A 04/07/2015   SLF: 1. Schatzki ring 2. Bravo capsule 34 cm from the teeth 3. moderate non-erosive gastritis.   Marland Kitchen MASS EXCISION N/A 2002   Lanyx  . MASS EXCISION Left 01/21/2015   Procedure: EXCISION CYST LEFT UPPER ARM;  Surgeon: Aviva Signs, MD;  Location: [facility];  Service: General;  Laterality: Left;  Marland Kitchen MICROLARYNGOSCOPY N/A 05/31/2016   Procedure: MICRO LARYNGOSCOPY WITH BIOPSY OF VOCAL CORD LESION;  Surgeon: Leta Baptist, MD;  Location: [facility];  Service: ENT;  Laterality: N/A;  . POLYPECTOMY N/A 04/07/2015   Procedure: POLYPECTOMY;  Surgeon: Danie Binder, MD;  Location: [facility];   Service: Endoscopy;  Laterality: N/A;  Descending colon polyps x 3   . SAVORY DILATION N/A 04/07/2015   Procedure: SAVORY DILATION;  Surgeon: Danie Binder, MD;  Location: [facility];  Service: Endoscopy;  Laterality: N/A;    There were no vitals filed for this visit.   Subjective Assessment - 08/28/19 1247    Subjective  S:  I fell in November and hurt my shoulder.  It isnt hurting as much now.    Pertinent History Ms. Parlier reports falling and injuring her left shoulder in November 2020.  She reports pain has lately subsided considerably and she is able to complete most daily tasks without difficulty.    Patient Stated Goals I want to use my arm normally    Currently in Pain? Yes    Pain Score 2     Pain Location Shoulder    Pain Orientation Left    Pain Descriptors / Indicators Aching    Pain Type Chronic pain    Pain Onset More than a month ago    Pain Frequency Intermittent    Aggravating Factors  movements above shoulder height    Pain Relieving Factors rest    Effect of Pain on Daily Activities minimal             [facility] OT Assessment - 08/28/19 0001  Assessment   Medical Diagnosis Left Shoulder Pain    Referring Provider (OT) Dr. 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      ADL   ADL comments reports that she is able to complete all daily tasks without difficulties      Written Expression   Dominant Hand Right      Observation/Other Assessments   Focus on Therapeutic Outcomes (FOTO)  did not complete due to 1 time eval      Sensation   Light Touch Appears Intact      Coordination   Gross Motor Movements are Fluid and Coordinated Yes    Fine Motor Movements are Fluid and Coordinated Yes        ROM / Strength   AROM / PROM / Strength AROM;Strength      Palpation   Palpation comment trace fascial restrictions in left shoulder region      AROM   AROM Assessment Site Shoulder    Right/Left Shoulder Left    Left Shoulder Flexion 130 Degrees    Left Shoulder ABduction 105 Degrees    Left Shoulder Internal Rotation 90 Degrees    Left Shoulder External Rotation 30 Degrees      Strength   Strength Assessment Site Shoulder    Right/Left Shoulder Left    Left Shoulder Flexion 4+/5    Left Shoulder ABduction 4+/5    Left Shoulder Internal Rotation 4+/5    Left Shoulder External Rotation 4+/5                           OT Education - 08/28/19 1249    Education Details Reviewed HEP for:  A/ROM shoulder flexion, abduction, retraction, external and internal rotation and shoulder extension, completing 5 reps of each in clinic. Shoulder stretches:  cross body stretch, wall climb for flexion and abduction, towel stretch and completed 1 rep of each in clinic.  Theraband for scapular and proximal shoulder strengthening:  extension, retraction, and row iwth min resist red theraband completed 5 reps of each in clinic.    Person(s) Educated Patient    Methods Explanation;Demonstration;Handout    Comprehension Verbalized understanding;Returned demonstration            OT Short Term Goals - 08/28/19 1257      OT SHORT TERM GOAL #1   Title Patient will be educated and independent with a HEP for shoulder mobility and strength.    Time 4    Period Weeks    Status New    Target Date 09/25/19      OT SHORT TERM GOAL #2   Title Patient will improve left shoulder a/rom to [facility] for adl completion.    Time 4    Period Weeks    Status New      OT SHORT TERM GOAL #3   Title patient will improve left shoulder strength to 5/5 for improved ability to lift bags of groceries and laundry baskets.    Time 4    Period Weeks    Status New      OT SHORT TERM GOAL #4  Title  Patient will decrease pain to 1/10 or better in left shoulder during adl completion.    Time 4    Period Weeks    Status New                    Plan - 08/28/19 1252    Clinical Impression Statement A:  Patient is a 69 year old female with left shoulder pain resulting from a fall in late November 2020.  An MRI detects partial tear of supraspinatus as well as arthritic changes to her shoulder joint.  Patient reports that lately her shoulder pain has lessened and she is able to complete most activities without difficulty.  When given option for skilled OT intervention versus a HEP, patient chose to receive a HEP and follow up with this OT in one month to asess progress.  HEP given this date for shoulder stretches, a/rom, and scapular strengthening with red theraband.  Patient educated and was able to return demonstration on each exercise this date.    OT Occupational Profile and History Problem Focused Assessment - Including review of records relating to presenting problem    Occupational performance deficits (Please refer to evaluation for details): ADL's    Body Structure / Function / Physical Skills ADL;Strength;Pain;UE functional use;ROM;Muscle spasms    Rehab Potential Good    Clinical Decision Making Limited treatment options, no task modification necessary    Comorbidities Affecting Occupational Performance: None    Modification or Assistance to Complete Evaluation  No modification of tasks or assist necessary to complete eval    OT Frequency Other (comment)   1 visit this week with follow up visit in 4 weeks to assess progress   OT Duration 4 weeks    OT Treatment/Interventions Self-care/ADL training;Therapeutic exercise;Manual Therapy;Neuromuscular education;Patient/family education;Therapeutic activities    Plan P:  OT visit in one month to assess progress towards improved mobility and strength and less pain in left shoulder with completion of HEP issued this date.    OT Home  Exercise Plan initial eval:  shoulder stretches, scapular strengthening and shoulder A/ROM.           Patient will benefit from skilled therapeutic intervention in order to improve the following deficits and impairments:   Body Structure / Function / Physical Skills: ADL, Strength, Pain, UE functional use, ROM, Muscle spasms       Visit Diagnosis: Chronic left shoulder pain - Plan: Ot plan of care cert/re-cert  Stiffness of left shoulder, not elsewhere classified - Plan: Ot plan of care cert/re-cert    Problem List Patient Active Problem List   Diagnosis Date Noted  . Peripheral neuropathy 04/03/2019  . Spinal stenosis at L4-L5 level 04/03/2019  . RUQ pain 11/20/2018  . Change in bowel function 11/20/2018  . Paresthesia 10/04/2018  . Gait abnormality 10/04/2018  . Incontinence of feces 10/04/2018  . Chronic cough 04/27/2016  . Constipation 04/27/2016  . GERD (gastroesophageal reflux disease) 03/30/2015  . Dysphagia 03/30/2015  . Encounter for screening colonoscopy 03/30/2015    Vangie Bicker, [place], OTR/L [phone]  08/28/2019, 2:05 PM  [facility] [address] [place], [place], [address] Phone: [phone]   Fax:  [phone]  Name: Anijah Spohr MRN: [id number] Date of Birth: 1950/07/31

## 2019-08-28 NOTE — Patient Instructions (Addendum)
1) ROM: Abduction (Standing)   Bring arms straight out from sides and raise as high as possible without pain. Repeat ____ times per set. Do ____ sets per session. Do ____ sessions per day.  http://orth.exer.us/910   Copyright  VHI. All rights reserved.   2) Extension (Active) ROM: Extension (Standing)   Bring arms straight back as far as possible without pain. Repeat ____ times per set. Do ____ sets per session. Do ____ sessions per day.  http://orth.exer.us/916   Copyright  VHI. All rights reserved.   3) ROM: External / Internal Rotation - in Abduction (Standing)   With upper arms parallel to floor and elbows bent at right angles, gently rotate arms up then down as far as possible without pain. Repeat ____ times per set. Do ____ sets per session. Do ____ sessions per day.  http://orth.exer.us/912   Copyright  VHI. All rights reserved.    4) Flexors Stretch (Active)   Stand, arms straight at sides. Bring arms straight forward and upward as high as possible without pain. Hold ___ seconds. Repeat ___ times per session. Do ___ sessions per day.  Copyright  VHI. All rights reserved.   5) Scapular Retraction (Standing)   With arms at sides, pinch shoulder blades together. Repeat ____ times per set. Do ____ sets per session. Do ____ sessions per day.  http://orth.exer.us/944   Copyright  VHI. All rights reserved.   1) (Home) Extension: Isometric / Bilateral Arm Retraction - Sitting   Facing anchor, hold hands and elbow at shoulder height, with elbow bent.  Pull arms back to squeeze shoulder blades together. Repeat 10-15 times. 1-3 times/day.   2) (Clinic) Extension / Flexion (Assist)   Face anchor, pull arms back, keeping elbow straight, and squeze shoulder blades together. Repeat 10-15 times. 1-3 times/day.   Copyright  VHI. All rights reserved.   3) (Home) Retraction: Row - Bilateral (Anchor)   Facing anchor, arms reaching forward, pull hands toward  stomach, keeping elbows bent and at your sides and pinching shoulder blades together. Repeat 10-15 times. 1-3 times/day.   Copyright  VHI. All rights reserved.  Complete the following exercises 2-3 times a day.  Doorway Stretch  Place each hand opposite each other on the doorway. (You can change where you feel the stretch by moving arms higher or lower.) Step through with one foot and bend front knee until a stretch is felt and hold. Step through with the opposite foot on the next rep. Hold for __10-15___ seconds. Repeat __2__times.     Scapular Retraction (Standing)   With arms at sides, pinch shoulder blades together. Repeat __10__ times per set. Do __1__ sets per session. Do __2__ sessions per day.  http://orth.exer.us/944   Copyright  VHI. All rights reserved.   Internal Rotation Across Back  Grab the end of a towel with your affected side, palm facing backwards. Grab the towel with your unaffected side and pull your affected hand across your back until you feel a stretch in the front of your shoulder. If you feel pain, pull just to the pain, do not pull through the pain. Hold. Return your affected arm to your side. Try to keep your hand/arm close to your body during the entire movement.     Hold for 10-15 seconds. Complete 2 times.        Posterior Capsule Stretch   Stand or sit, one arm across body so hand rests over opposite shoulder. Gently push on crossed elbow with other hand until stretch  is felt in shoulder of crossed arm. Hold _10-15__ seconds.  Repeat _2__ times per session. Do ___ sessions per day.   Wall Flexion  Slide your arm up the wall or door frame until a stretch is felt in your shoulder . Hold for 10-15 seconds. Complete 2 times     Shoulder Abduction Stretch  Stand side ways by a wall with affected up on wall. Gently step in toward wall to feel stretch. Hold for 10-15 seconds. Complete 2 times.

## 2019-08-29 ENCOUNTER — Ambulatory Visit (HOSPITAL_COMMUNITY): Payer: Medicare Other

## 2019-09-03 ENCOUNTER — Ambulatory Visit: Payer: Medicare Other | Admitting: Orthopaedic Surgery

## 2019-09-11 ENCOUNTER — Telehealth (HOSPITAL_COMMUNITY): Payer: Self-pay | Admitting: Physical Therapy

## 2019-09-11 ENCOUNTER — Ambulatory Visit (HOSPITAL_COMMUNITY): Payer: Medicare Other | Admitting: Physical Therapy

## 2019-09-11 NOTE — Telephone Encounter (Signed)
Called and left a message notifying patient of missed appointment and reminding patient of next scheduled appointment.   Clarene Critchley PT, DPT 4:50 PM, 09/11/19 [phone]

## 2019-09-18 ENCOUNTER — Ambulatory Visit (HOSPITAL_COMMUNITY): Payer: Medicare Other | Admitting: Physical Therapy

## 2019-09-18 ENCOUNTER — Telehealth (HOSPITAL_COMMUNITY): Payer: Self-pay | Admitting: Physical Therapy

## 2019-09-18 NOTE — Telephone Encounter (Signed)
S/w pt she agreed to cx 7/14 and 7/16. She will return on 09/24/19 - she doesn't like the late apptment times. She understands that is all we had at the time.

## 2019-09-20 ENCOUNTER — Ambulatory Visit (HOSPITAL_COMMUNITY): Payer: Medicare Other

## 2019-09-24 ENCOUNTER — Ambulatory Visit (HOSPITAL_COMMUNITY): Payer: Medicare Other | Admitting: Physical Therapy

## 2019-09-24 ENCOUNTER — Telehealth (HOSPITAL_COMMUNITY): Payer: Self-pay | Admitting: Physical Therapy

## 2019-09-24 NOTE — Telephone Encounter (Signed)
She is not feeling well and wants to r/s for another day this wk if possible- she would not take a 5:30pm so she will be back Thursday 7/22.

## 2019-09-26 ENCOUNTER — Telehealth (HOSPITAL_COMMUNITY): Payer: Self-pay | Admitting: Physical Therapy

## 2019-09-26 ENCOUNTER — Ambulatory Visit (HOSPITAL_COMMUNITY): Payer: Medicare Other | Admitting: Physical Therapy

## 2019-09-26 NOTE — Telephone Encounter (Signed)
She called and staid her car is in the shop and it's not ready now - she wants to cx and will r/s next week when she comes in for OT on Tuesday

## 2019-10-01 ENCOUNTER — Ambulatory Visit (HOSPITAL_COMMUNITY): Payer: Medicare Other | Admitting: Physical Therapy

## 2019-10-01 ENCOUNTER — Encounter (HOSPITAL_COMMUNITY): Payer: Medicare Other | Admitting: Occupational Therapy

## 2019-10-03 ENCOUNTER — Ambulatory Visit (HOSPITAL_COMMUNITY): Payer: Medicare Other | Admitting: Physical Therapy

## 2019-11-13 ENCOUNTER — Other Ambulatory Visit: Payer: Self-pay

## 2019-11-13 ENCOUNTER — Ambulatory Visit: Payer: Medicare Other | Admitting: Orthopaedic Surgery

## 2019-11-27 ENCOUNTER — Ambulatory Visit: Payer: Medicare Other | Admitting: Orthopaedic Surgery

## 2019-12-25 ENCOUNTER — Other Ambulatory Visit: Payer: Self-pay

## 2019-12-25 ENCOUNTER — Ambulatory Visit: Payer: Medicare Other | Admitting: Orthopaedic Surgery

## 2020-01-08 ENCOUNTER — Ambulatory Visit: Payer: Medicare Other | Admitting: Orthopaedic Surgery

## 2020-01-08 ENCOUNTER — Other Ambulatory Visit: Payer: Self-pay

## 2020-01-22 ENCOUNTER — Ambulatory Visit: Payer: Medicare Other | Admitting: Orthopaedic Surgery

## 2020-01-22 ENCOUNTER — Other Ambulatory Visit: Payer: Self-pay

## 2020-03-31 ENCOUNTER — Encounter: Payer: Self-pay | Admitting: Internal Medicine

## 2020-04-02 ENCOUNTER — Other Ambulatory Visit: Payer: Medicare Other

## 2020-04-21 ENCOUNTER — Encounter: Payer: Self-pay | Admitting: Internal Medicine

## 2020-04-21 ENCOUNTER — Ambulatory Visit: Payer: Medicare Other | Admitting: Gastroenterology

## 2020-07-01 ENCOUNTER — Ambulatory Visit: Payer: Medicare Other | Admitting: Gastroenterology

## 2020-08-17 ENCOUNTER — Encounter: Payer: Self-pay | Admitting: Gastroenterology

## 2020-09-25 ENCOUNTER — Ambulatory Visit: Payer: Medicare Other | Admitting: Gastroenterology

## 2020-09-30 ENCOUNTER — Ambulatory Visit (HOSPITAL_COMMUNITY)
Admission: RE | Admit: 2020-09-30 | Discharge: 2020-09-30 | Disposition: A | Discharge status: Home / Self Care | Payer: Medicare Other | Source: Ambulatory Visit | Attending: Family Medicine | Admitting: Family Medicine

## 2020-09-30 ENCOUNTER — Other Ambulatory Visit: Payer: Self-pay

## 2020-09-30 ENCOUNTER — Other Ambulatory Visit (HOSPITAL_COMMUNITY): Payer: Self-pay | Admitting: Family Medicine

## 2020-09-30 DIAGNOSIS — R52 Pain, unspecified: Secondary | ICD-10-CM | POA: Insufficient documentation

## 2020-09-30 DIAGNOSIS — R609 Edema, unspecified: Secondary | ICD-10-CM | POA: Insufficient documentation

## 2020-10-27 ENCOUNTER — Ambulatory Visit: Payer: Medicare Other | Admitting: Gastroenterology

## 2020-10-27 ENCOUNTER — Encounter: Payer: Self-pay | Admitting: Internal Medicine

## 2020-11-10 IMAGING — CT CT HEAD WITHOUT CONTRAST
3 series · 15 of 45 positions shown, 18 images · non-contrast
Comparison: 06/06/2016

CLINICAL DATA: Chronic sinusitis. Left maxillary sinus pain and
pressure. Dizziness and unsteady gait.

EXAM:
CT HEAD WITHOUT CONTRAST
TECHNIQUE: Contiguous axial images were obtained from the base of the skull
through the vertex without intravenous contrast.

[Series 2: head wo · axial · 0.41mm/px · z∈[+39,+154]mm · 9 of 28 slices shown, 12 images]
[im 3/28  brain]
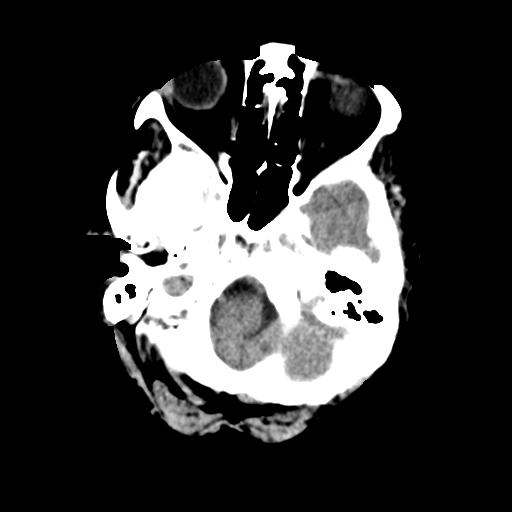
[im 3/28  bone]
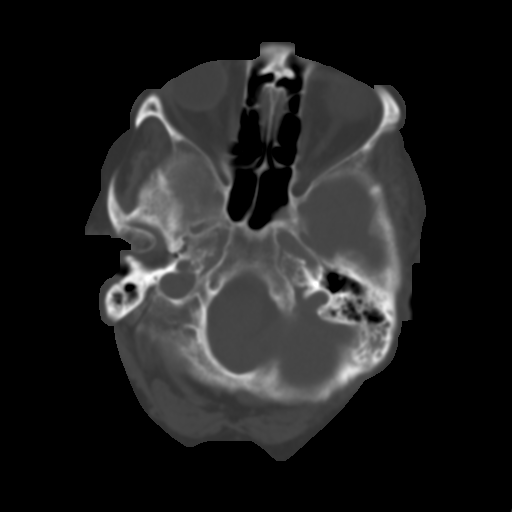
[im 6/28  brain]
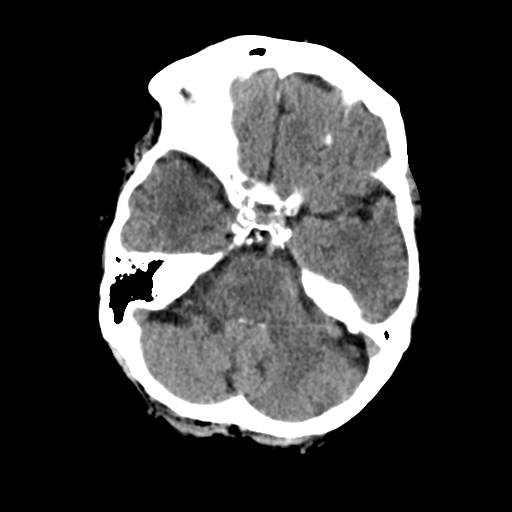
[im 9/28  brain]
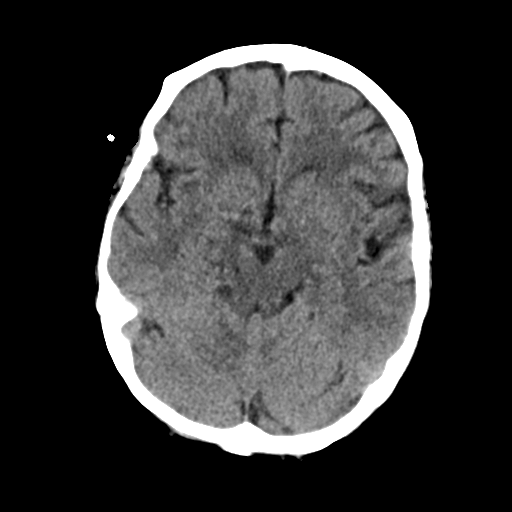
[im 12/28  brain]
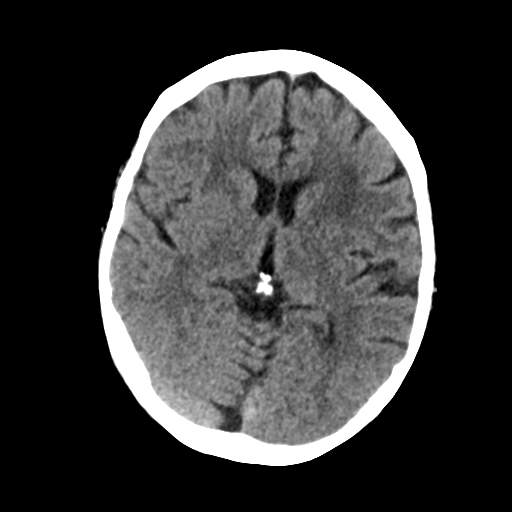
[im 15/28  brain]
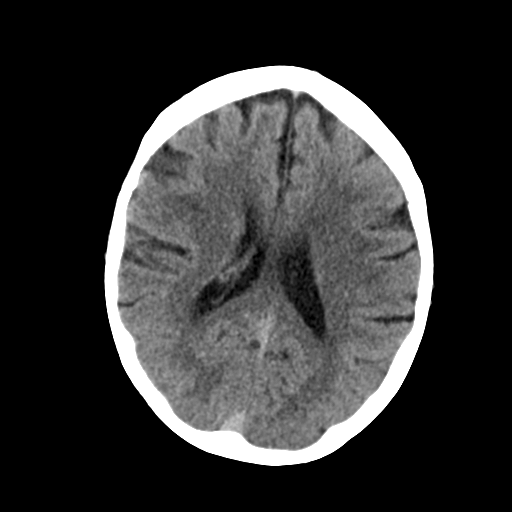
[im 15/28  bone]
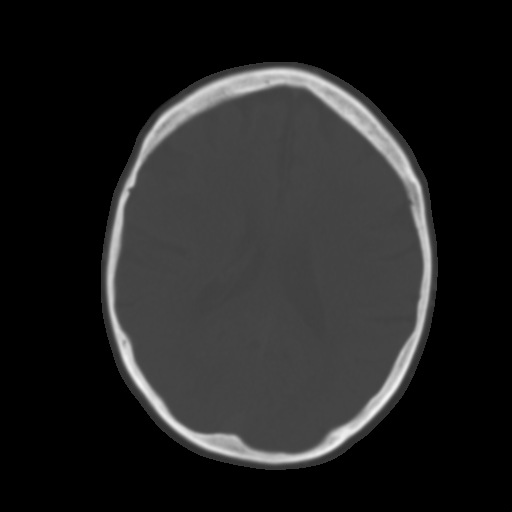
[im 17/28  brain]
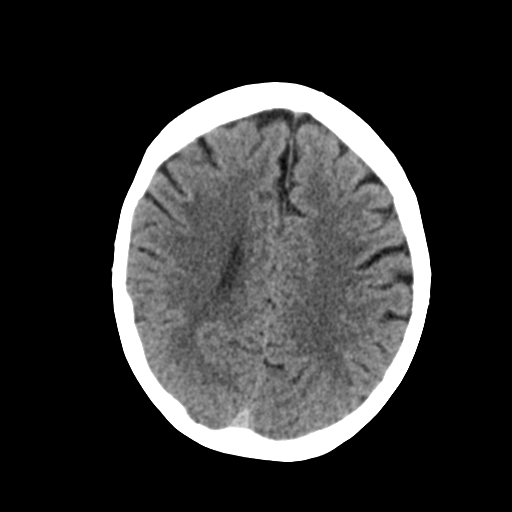
[im 20/28  brain]
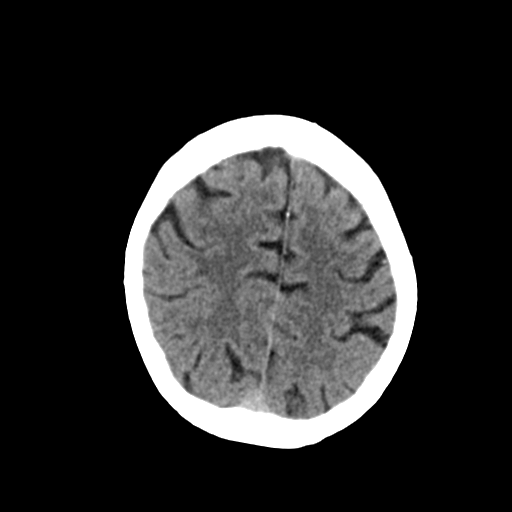
[im 23/28  brain]
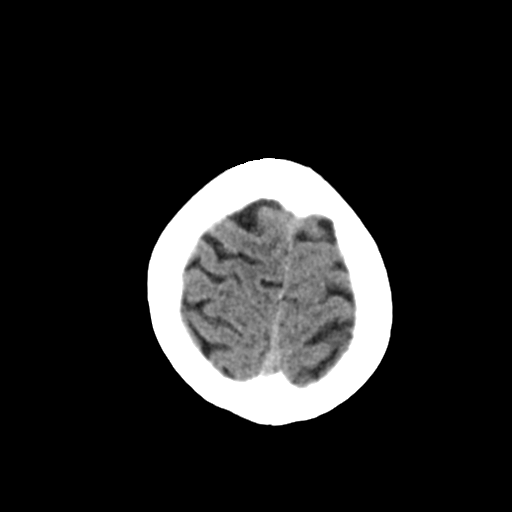
[im 26/28  brain]
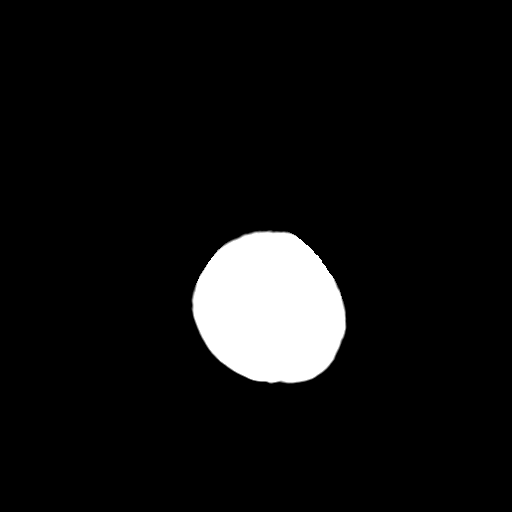
[im 26/28  bone]
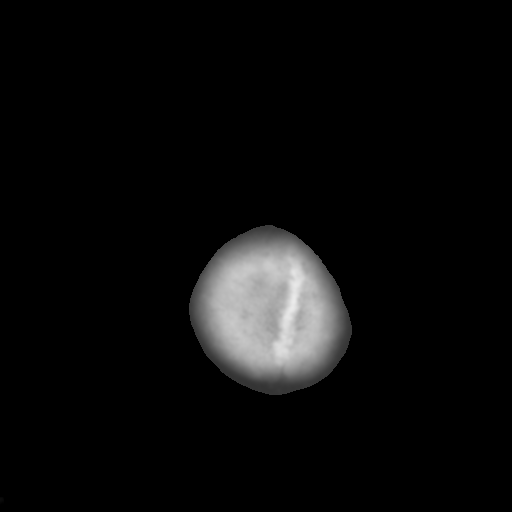

[Series 4: coronal soft tissue · coronal · 0.34mm/px · 3 of 67 slices shown]
[im 23/67  brain]
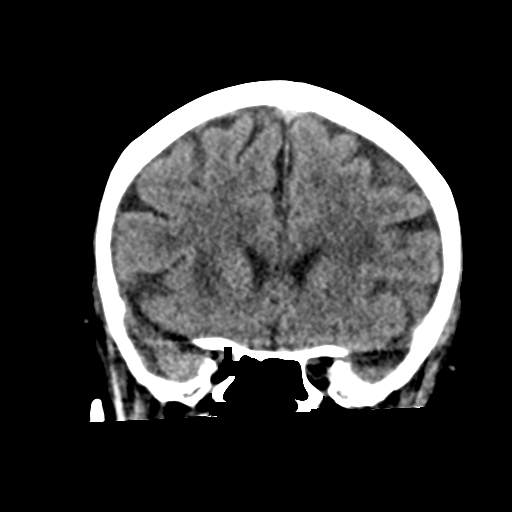
[im 30/67  brain]
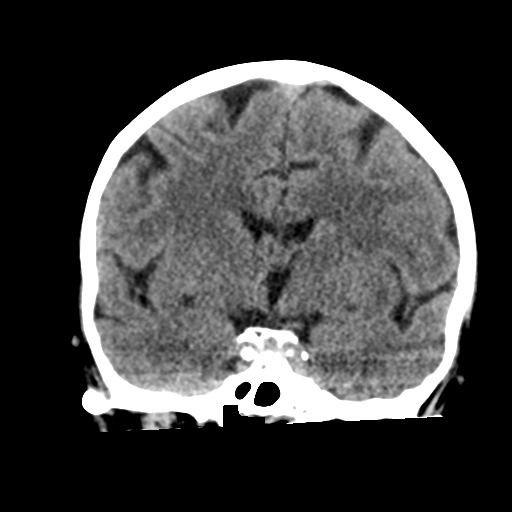
[im 37/67  brain]
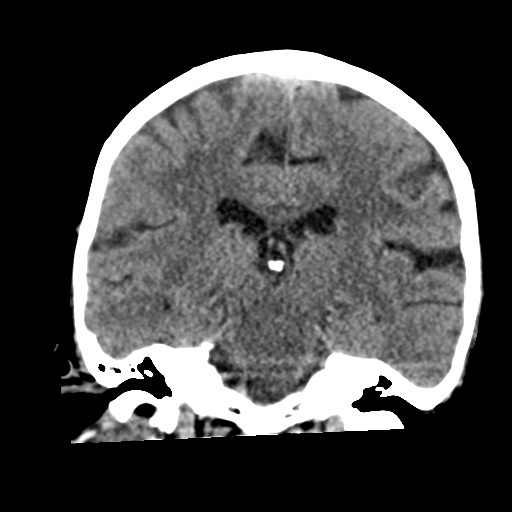

[Series 5: sagittal soft tissue · sagittal · 0.29mm/px · 3 of 67 slices shown]
[im 26/67  brain]
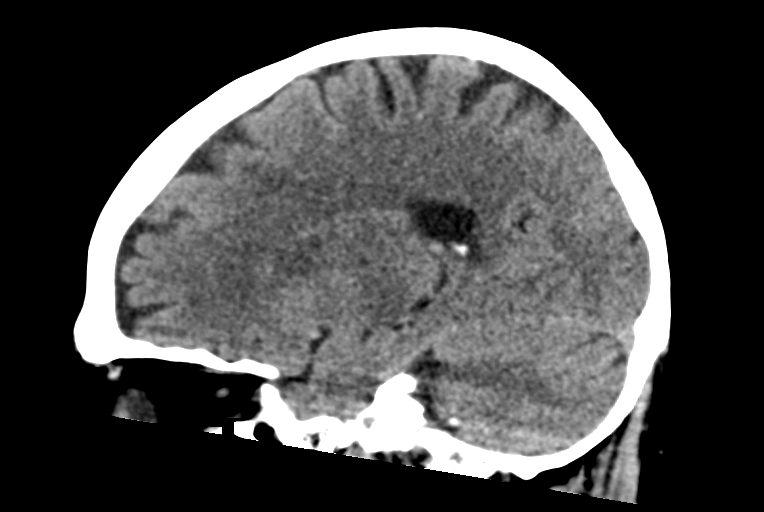
[im 34/67  brain]
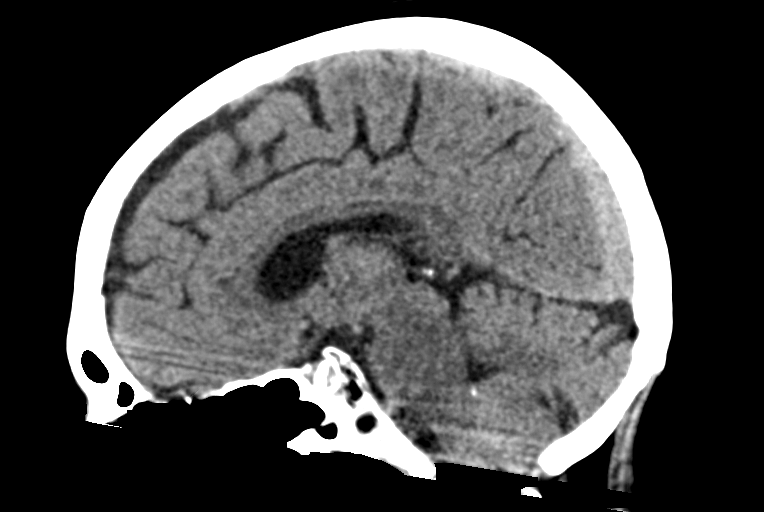
[im 42/67  brain]
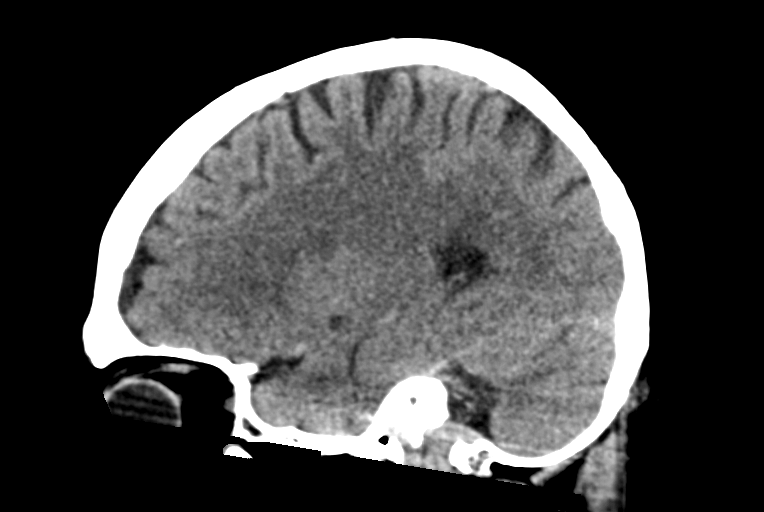

[15 of 45 positions shown; findings below may reference images not displayed]

FINDINGS: Brain: There is no evidence of acute infarct, intracranial
hemorrhage, mass, midline shift, or extra-axial fluid collection.
The ventricles and sulci are within normal limits for age. Patchy
hypodensities in the cerebral white matter, most notable in the
frontal lobes and anterior basal ganglia regions, are similar to the
prior study and nonspecific but compatible with moderate chronic
small vessel ischemic disease. Mild cerebral atrophy is unchanged
and within normal limits for age.

Vascular: Calcified atherosclerosis at the skull base. No hyperdense
vessel.

Skull: No fracture focal osseous lesion.

Sinuses/Orbits: Paranasal sinuses more fully evaluated on separate
dedicated sinus CT. Left larger than right mastoid effusions.
Unremarkable visualized orbits.

Other: None.
IMPRESSION: 1. No evidence of acute intracranial abnormality.
2. Moderate chronic small vessel ischemic disease.
3. Left larger than right mastoid effusions.

## 2021-05-05 ENCOUNTER — Other Ambulatory Visit (HOSPITAL_COMMUNITY): Payer: Self-pay | Admitting: Family Medicine

## 2021-05-05 DIAGNOSIS — Z780 Asymptomatic menopausal state: Secondary | ICD-10-CM

## 2021-06-23 ENCOUNTER — Emergency Department (HOSPITAL_COMMUNITY): Payer: Medicare Other

## 2021-06-23 ENCOUNTER — Inpatient Hospital Stay (HOSPITAL_COMMUNITY)
Admission: EM | Admit: 2021-06-23 | Discharge: 2021-06-28 | DRG: 493 | Disposition: A | Discharge status: Skilled Nursing Facility | Payer: Medicare Other | Attending: Internal Medicine | Admitting: Internal Medicine

## 2021-06-23 ENCOUNTER — Encounter (HOSPITAL_COMMUNITY): Payer: Self-pay | Admitting: Emergency Medicine

## 2021-06-23 DIAGNOSIS — F1020 Alcohol dependence, uncomplicated: Secondary | ICD-10-CM

## 2021-06-23 DIAGNOSIS — S42341A Displaced spiral fracture of shaft of humerus, right arm, initial encounter for closed fracture: Secondary | ICD-10-CM | POA: Diagnosis not present

## 2021-06-23 DIAGNOSIS — I959 Hypotension, unspecified: Secondary | ICD-10-CM

## 2021-06-23 DIAGNOSIS — Z6828 Body mass index (BMI) 28.0-28.9, adult: Secondary | ICD-10-CM

## 2021-06-23 DIAGNOSIS — E46 Unspecified protein-calorie malnutrition: Secondary | ICD-10-CM

## 2021-06-23 DIAGNOSIS — S42201A Unspecified fracture of upper end of right humerus, initial encounter for closed fracture: Secondary | ICD-10-CM

## 2021-06-23 DIAGNOSIS — I4891 Unspecified atrial fibrillation: Secondary | ICD-10-CM | POA: Diagnosis not present

## 2021-06-23 DIAGNOSIS — K219 Gastro-esophageal reflux disease without esophagitis: Secondary | ICD-10-CM | POA: Diagnosis present

## 2021-06-23 DIAGNOSIS — E441 Mild protein-calorie malnutrition: Secondary | ICD-10-CM | POA: Diagnosis present

## 2021-06-23 DIAGNOSIS — R55 Syncope and collapse: Secondary | ICD-10-CM | POA: Diagnosis present

## 2021-06-23 DIAGNOSIS — W19XXXA Unspecified fall, initial encounter: Secondary | ICD-10-CM

## 2021-06-23 DIAGNOSIS — Z86711 Personal history of pulmonary embolism: Secondary | ICD-10-CM

## 2021-06-23 DIAGNOSIS — E782 Mixed hyperlipidemia: Secondary | ICD-10-CM | POA: Diagnosis present

## 2021-06-23 DIAGNOSIS — Z79899 Other long term (current) drug therapy: Secondary | ICD-10-CM

## 2021-06-23 DIAGNOSIS — G629 Polyneuropathy, unspecified: Secondary | ICD-10-CM | POA: Diagnosis present

## 2021-06-23 DIAGNOSIS — F1010 Alcohol abuse, uncomplicated: Secondary | ICD-10-CM | POA: Diagnosis present

## 2021-06-23 DIAGNOSIS — S42291A Other displaced fracture of upper end of right humerus, initial encounter for closed fracture: Secondary | ICD-10-CM

## 2021-06-23 DIAGNOSIS — I10 Essential (primary) hypertension: Secondary | ICD-10-CM | POA: Diagnosis present

## 2021-06-23 DIAGNOSIS — Y92008 Other place in unspecified non-institutional (private) residence as the place of occurrence of the external cause: Secondary | ICD-10-CM

## 2021-06-23 DIAGNOSIS — W1839XA Other fall on same level, initial encounter: Secondary | ICD-10-CM | POA: Diagnosis present

## 2021-06-23 DIAGNOSIS — I951 Orthostatic hypotension: Secondary | ICD-10-CM | POA: Diagnosis present

## 2021-06-23 DIAGNOSIS — F17210 Nicotine dependence, cigarettes, uncomplicated: Secondary | ICD-10-CM | POA: Diagnosis present

## 2021-06-23 DIAGNOSIS — E876 Hypokalemia: Secondary | ICD-10-CM | POA: Diagnosis present

## 2021-06-23 DIAGNOSIS — F39 Unspecified mood [affective] disorder: Secondary | ICD-10-CM | POA: Diagnosis present

## 2021-06-23 DIAGNOSIS — D72829 Elevated white blood cell count, unspecified: Secondary | ICD-10-CM | POA: Diagnosis present

## 2021-06-23 DIAGNOSIS — E8809 Other disorders of plasma-protein metabolism, not elsewhere classified: Secondary | ICD-10-CM | POA: Diagnosis present

## 2021-06-23 LAB — BASIC METABOLIC PANEL
Anion gap: 9 (ref 5–15)
BUN: 17 mg/dL (ref 8–23)
CO2: 21 mmol/L — ABNORMAL LOW (ref 22–32)
Calcium: 9.2 mg/dL (ref 8.9–10.3)
Chloride: 105 mmol/L (ref 98–111)
Creatinine, Ser: 1.03 mg/dL — ABNORMAL HIGH (ref 0.44–1.00)
GFR, Estimated: 58 mL/min — ABNORMAL LOW (ref >60)
Glucose, Bld: 97 mg/dL (ref 70–99)
Potassium: 4 mmol/L (ref 3.5–5.1)
Sodium: 135 mmol/L (ref 135–145)

## 2021-06-23 LAB — CBC
HCT: 46.1 % — ABNORMAL HIGH (ref 36.0–46.0)
Hemoglobin: 15.8 g/dL — ABNORMAL HIGH (ref 12.0–15.0)
MCH: 33 pg (ref 26.0–34.0)
MCHC: 34.3 g/dL (ref 30.0–36.0)
MCV: 96.2 fL (ref 80.0–100.0)
Platelets: 222 10*3/uL (ref 150–400)
RBC: 4.79 MIL/uL (ref 3.87–5.11)
RDW: 14.8 % (ref 11.5–15.5)
WBC: 13.6 10*3/uL — ABNORMAL HIGH (ref 4.0–10.5)
nRBC: 0 % (ref 0.0–0.2)

## 2021-06-23 MED ORDER — SODIUM CHLORIDE 0.9 % IV BOLUS
1000.0000 mL | Freq: Once | INTRAVENOUS | Status: AC
  Administered 2021-06-23: 1000 mL via INTRAVENOUS

## 2021-06-23 MED ORDER — FENTANYL CITRATE PF 50 MCG/ML IJ SOSY
25.0000 ug | PREFILLED_SYRINGE | Freq: Once | INTRAMUSCULAR | Status: AC
  Administered 2021-06-23: 25 ug via INTRAVENOUS
  Filled 2021-06-23: qty 1

## 2021-06-23 NOTE — ED Triage Notes (Signed)
Pt c/o syncopal episode resulting in fall. Pt presents with obvious right arm deformity. Pt found to be hypotensive for EMS.  ?

## 2021-06-23 NOTE — ED Notes (Signed)
Pt states she was going to call her pcp to see about decreasing her BP meds because her bp has been like this for ahwile. ?

## 2021-06-23 NOTE — ED Notes (Signed)
Pt took '10mg'$  hydrocodone of her pain medication before ems arrival, ?

## 2021-06-23 NOTE — ED Notes (Signed)
Xr at bedside

## 2021-06-24 ENCOUNTER — Other Ambulatory Visit: Payer: Self-pay

## 2021-06-24 ENCOUNTER — Inpatient Hospital Stay (HOSPITAL_COMMUNITY): Payer: Medicare Other

## 2021-06-24 DIAGNOSIS — E8809 Other disorders of plasma-protein metabolism, not elsewhere classified: Secondary | ICD-10-CM | POA: Diagnosis present

## 2021-06-24 DIAGNOSIS — F17210 Nicotine dependence, cigarettes, uncomplicated: Secondary | ICD-10-CM | POA: Diagnosis present

## 2021-06-24 DIAGNOSIS — W19XXXA Unspecified fall, initial encounter: Secondary | ICD-10-CM | POA: Diagnosis not present

## 2021-06-24 DIAGNOSIS — E46 Unspecified protein-calorie malnutrition: Secondary | ICD-10-CM

## 2021-06-24 DIAGNOSIS — R55 Syncope and collapse: Secondary | ICD-10-CM

## 2021-06-24 DIAGNOSIS — K219 Gastro-esophageal reflux disease without esophagitis: Secondary | ICD-10-CM

## 2021-06-24 DIAGNOSIS — Z6828 Body mass index (BMI) 28.0-28.9, adult: Secondary | ICD-10-CM | POA: Diagnosis not present

## 2021-06-24 DIAGNOSIS — S42291A Other displaced fracture of upper end of right humerus, initial encounter for closed fracture: Secondary | ICD-10-CM | POA: Diagnosis not present

## 2021-06-24 DIAGNOSIS — E441 Mild protein-calorie malnutrition: Secondary | ICD-10-CM | POA: Diagnosis present

## 2021-06-24 DIAGNOSIS — E782 Mixed hyperlipidemia: Secondary | ICD-10-CM

## 2021-06-24 DIAGNOSIS — I4891 Unspecified atrial fibrillation: Secondary | ICD-10-CM | POA: Diagnosis not present

## 2021-06-24 DIAGNOSIS — Z79899 Other long term (current) drug therapy: Secondary | ICD-10-CM | POA: Diagnosis not present

## 2021-06-24 DIAGNOSIS — S42301A Unspecified fracture of shaft of humerus, right arm, initial encounter for closed fracture: Secondary | ICD-10-CM | POA: Diagnosis not present

## 2021-06-24 DIAGNOSIS — I10 Essential (primary) hypertension: Secondary | ICD-10-CM

## 2021-06-24 DIAGNOSIS — I959 Hypotension, unspecified: Secondary | ICD-10-CM

## 2021-06-24 DIAGNOSIS — Y92008 Other place in unspecified non-institutional (private) residence as the place of occurrence of the external cause: Secondary | ICD-10-CM | POA: Diagnosis not present

## 2021-06-24 DIAGNOSIS — S42341A Displaced spiral fracture of shaft of humerus, right arm, initial encounter for closed fracture: Secondary | ICD-10-CM | POA: Diagnosis present

## 2021-06-24 DIAGNOSIS — Y92009 Unspecified place in unspecified non-institutional (private) residence as the place of occurrence of the external cause: Secondary | ICD-10-CM

## 2021-06-24 DIAGNOSIS — Z86711 Personal history of pulmonary embolism: Secondary | ICD-10-CM | POA: Diagnosis not present

## 2021-06-24 DIAGNOSIS — G709 Myoneural disorder, unspecified: Secondary | ICD-10-CM | POA: Diagnosis not present

## 2021-06-24 DIAGNOSIS — E876 Hypokalemia: Secondary | ICD-10-CM | POA: Diagnosis present

## 2021-06-24 DIAGNOSIS — F1010 Alcohol abuse, uncomplicated: Secondary | ICD-10-CM | POA: Diagnosis present

## 2021-06-24 DIAGNOSIS — W1839XA Other fall on same level, initial encounter: Secondary | ICD-10-CM | POA: Diagnosis present

## 2021-06-24 DIAGNOSIS — D72829 Elevated white blood cell count, unspecified: Secondary | ICD-10-CM

## 2021-06-24 DIAGNOSIS — G629 Polyneuropathy, unspecified: Secondary | ICD-10-CM | POA: Diagnosis present

## 2021-06-24 DIAGNOSIS — S42331A Displaced oblique fracture of shaft of humerus, right arm, initial encounter for closed fracture: Secondary | ICD-10-CM

## 2021-06-24 DIAGNOSIS — F39 Unspecified mood [affective] disorder: Secondary | ICD-10-CM | POA: Diagnosis present

## 2021-06-24 DIAGNOSIS — I951 Orthostatic hypotension: Secondary | ICD-10-CM | POA: Diagnosis present

## 2021-06-24 LAB — ECHOCARDIOGRAM COMPLETE
AR max vel: 2.62 cm2
AV Area VTI: 2.78 cm2
AV Area mean vel: 2.31 cm2
AV Mean grad: 2 mmHg
AV Peak grad: 4.7 mmHg
Ao pk vel: 1.08 m/s
Area-P 1/2: 2.82 cm2
Height: 67 in
MV VTI: 2.65 cm2
S' Lateral: 3 cm
Single Plane A4C EF: 63.2 %
Weight: 2880 oz

## 2021-06-24 LAB — HEPATIC FUNCTION PANEL
ALT: 19 U/L (ref 0–44)
AST: 18 U/L (ref 15–41)
Albumin: 3.3 g/dL — ABNORMAL LOW (ref 3.5–5.0)
Alkaline Phosphatase: 72 U/L (ref 38–126)
Bilirubin, Direct: 0.1 mg/dL (ref 0.0–0.2)
Indirect Bilirubin: 0.3 mg/dL (ref 0.3–0.9)
Total Bilirubin: 0.4 mg/dL (ref 0.3–1.2)
Total Protein: 6.3 g/dL — ABNORMAL LOW (ref 6.5–8.1)

## 2021-06-24 LAB — LACTIC ACID, PLASMA
Lactic Acid, Venous: 0.8 mmol/L (ref 0.5–1.9)
Lactic Acid, Venous: 0.7 mmol/L (ref 0.5–1.9)

## 2021-06-24 LAB — PHOSPHORUS: Phosphorus: 2.2 mg/dL — ABNORMAL LOW (ref 2.5–4.6)

## 2021-06-24 MED ORDER — ACETAMINOPHEN 650 MG RE SUPP: 650.0000 mg | Freq: Four times a day (QID) | RECTAL | Status: DC | PRN

## 2021-06-24 MED ORDER — ENSURE ENLIVE PO LIQD
237.0000 mL | Freq: Two times a day (BID) | ORAL | Status: DC
  Administered 2021-06-24: 237 mL via ORAL
  Filled 2021-06-24 (×4): qty 237

## 2021-06-24 MED ORDER — ONDANSETRON HCL 4 MG PO TABS: 4.0000 mg | ORAL_TABLET | Freq: Four times a day (QID) | ORAL | Status: DC | PRN

## 2021-06-24 MED ORDER — FENTANYL CITRATE PF 50 MCG/ML IJ SOSY
25.0000 ug | PREFILLED_SYRINGE | Freq: Once | INTRAMUSCULAR | Status: AC
  Administered 2021-06-24: 25 ug via INTRAVENOUS
  Filled 2021-06-24: qty 1

## 2021-06-24 MED ORDER — PRAVASTATIN SODIUM 10 MG PO TABS
20.0000 mg | ORAL_TABLET | Freq: Every day | ORAL | Status: DC
  Administered 2021-06-24 – 2021-06-27 (×4): 20 mg via ORAL
  Filled 2021-06-24 (×4): qty 2

## 2021-06-24 MED ORDER — VENLAFAXINE HCL ER 75 MG PO CP24
300.0000 mg | ORAL_CAPSULE | Freq: Every evening | ORAL | Status: DC
  Administered 2021-06-24 – 2021-06-27 (×4): 300 mg via ORAL
  Filled 2021-06-24 (×4): qty 4

## 2021-06-24 MED ORDER — FENTANYL CITRATE PF 50 MCG/ML IJ SOSY
25.0000 ug | PREFILLED_SYRINGE | INTRAMUSCULAR | Status: DC | PRN
Start: 2021-06-24 — End: 2021-06-28
  Administered 2021-06-24 – 2021-06-27 (×11): 25 ug via INTRAVENOUS
  Filled 2021-06-24 (×11): qty 1

## 2021-06-24 MED ORDER — PANTOPRAZOLE SODIUM 40 MG PO TBEC
40.0000 mg | DELAYED_RELEASE_TABLET | Freq: Every day | ORAL | Status: DC
  Administered 2021-06-25 – 2021-06-28 (×4): 40 mg via ORAL
  Filled 2021-06-24 (×4): qty 1

## 2021-06-24 MED ORDER — PANTOPRAZOLE SODIUM 40 MG IV SOLR
40.0000 mg | INTRAVENOUS | Status: DC
  Administered 2021-06-24: 40 mg via INTRAVENOUS
  Filled 2021-06-24: qty 10

## 2021-06-24 MED ORDER — SODIUM CHLORIDE 0.9 % IV SOLN: INTRAVENOUS | Status: AC

## 2021-06-24 MED ORDER — FENTANYL CITRATE PF 50 MCG/ML IJ SOSY
25.0000 ug | PREFILLED_SYRINGE | INTRAMUSCULAR | Status: DC | PRN
  Administered 2021-06-24: 25 ug via INTRAVENOUS
  Filled 2021-06-24: qty 1

## 2021-06-24 MED ORDER — ACETAMINOPHEN 325 MG PO TABS: 650.0000 mg | ORAL_TABLET | Freq: Four times a day (QID) | ORAL | Status: DC | PRN

## 2021-06-24 MED ORDER — CEFAZOLIN SODIUM-DEXTROSE 2-4 GM/100ML-% IV SOLN
2.0000 g | INTRAVENOUS | Status: AC
  Administered 2021-06-25: 2 g via INTRAVENOUS
  Filled 2021-06-24: qty 100

## 2021-06-24 MED ORDER — ONDANSETRON HCL 4 MG/2ML IJ SOLN: 4.0000 mg | Freq: Four times a day (QID) | INTRAMUSCULAR | Status: DC | PRN

## 2021-06-24 MED ORDER — DULOXETINE HCL 60 MG PO CPEP
60.0000 mg | ORAL_CAPSULE | Freq: Every day | ORAL | Status: DC
  Administered 2021-06-24 – 2021-06-28 (×5): 60 mg via ORAL
  Filled 2021-06-24 (×2): qty 1
  Filled 2021-06-24: qty 2
  Filled 2021-06-24 (×2): qty 1

## 2021-06-24 MED ORDER — PERFLUTREN LIPID MICROSPHERE
1.0000 mL | INTRAVENOUS | Status: AC | PRN
  Administered 2021-06-24: 5 mL via INTRAVENOUS
  Filled 2021-06-24: qty 10

## 2021-06-24 NOTE — Progress Notes (Signed)
*  PRELIMINARY RESULTS* ?Echocardiogram ?2D Echocardiogram has been performed. ? ?Laura Mcgee ?06/24/2021, 3:46 PM ?

## 2021-06-24 NOTE — Progress Notes (Signed)
?  PROGRESS NOTE ? ?Patient admitted earlier this morning. See H&P.  ? ?Laura Mcgee is a 71 y.o. female with medical history significant of hypertension, hyperlipidemia, GERD who presents to the emergency department after having a syncopal episode at home that resulted in a fall with a subsequent right humeral fracture.   ? ?Patient seen in the emergency department with family at bedside.  Patient without any new complaints.  Vital signs are stable. Carotid Doppler without significant stenosis ? ?Echocardiogram pending ?Obtain orthostatic vital sign ?Hold antihypertensive medications ?Orthopedic surgery consultation pending ? ? ?Status is: Inpatient ?Remains inpatient appropriate because: orthopedic surgery consult pending  ? ? ?Dessa Phi, DO ?[facility] ?06/24/2021, 10:33 AM ? ?Available via Epic secure chat 7am-7pm ?After these hours, please refer to coverage provider listed on [email or web address]  ? ?

## 2021-06-24 NOTE — Progress Notes (Signed)
?  Transition of Care (TOC) Screening Note ? ? ?Patient Details  ?Name: Laura Mcgee ?Date of Birth: 10-Nov-1950 ? ? ?Transition of Care (TOC) CM/SW Contact:    ?Iona Beard, LCSWA ?Phone Number: ?06/24/2021, 10:41 AM ? ? ? ?Transition of Care Department [facility]) has reviewed patient and no TOC needs have been identified at this time. We will continue to monitor patient advancement through interdisciplinary progression rounds. If new patient transition needs arise, please place a TOC consult. ?  ?

## 2021-06-24 NOTE — H&P (Signed)
?History and Physical  ? ? ?Patient: Laura Mcgee ZMO:[id number] DOB: [birth year] ?DOA: 06/23/2021 ?DOS: the patient was seen and examined on 06/24/2021 ?PCP: Lemmie Evens, MD  ?Patient coming from: Home ? ?Chief Complaint:  ?Chief Complaint  ?Patient presents with  ? Fall  ? ?HPI: Laura Mcgee is a 71 y.o. female with medical history significant of hypertension, hyperlipidemia, GERD who presents to the emergency department after having a syncopal episode at home that resulted in a fall with a subsequent right humeral fracture.  Patient states that when she first got up to turn down the thermostat at her home around 7 PM last night, she felt so dizzy, she felt like she was going to fall, so she held onto the wall for a few seconds after which the dizziness subsided and she walked back to a sofa in the living room.  Shortly after this, she had to get up to get something from the kitchen, as soon as she got up, she felt dizzy again, unfortunately, at this time, she felt so dizzy that she passed out.  She believed that she must have passed out for only a few minutes, on waking up, she tried to get up and realize that it was painful to move her right arm, so she called for help and she was taken to the ED for further evaluation and management.  She denies chest pain, shortness of breath, palpitations, fever, chills, vomiting. ? ?ED Course:  ?In the emergency department, BP was in hypotensive range on arrival (85/64), this improved with IV hydration.  Work-up in the ED showed leukocytosis, H/H 15.3/46.2, albumin 3.3, lactic acid 0.7.   ?CT head without contrast showed no acute intracranial injury.  No calvarial fracture ?Chest x-ray showed minimal left basilar atelectasis ?Right humerus x-ray showed acute fracture of the proximal right humerus ?Patient was treated with IV fentanyl and IV hydration was provided. ? ? ?Review of Systems: ?Review of systems as noted in the HPI. All other systems reviewed and are  negative. ? ? ?Past Medical History:  ?Diagnosis Date  ? Depression   ? Difficult intubation   ? History of laryngospasm  ? GERD (gastroesophageal reflux disease)   ? Hyperlipidemia   ? Hypertension   ? Numbness and tingling   ? Pulmonary embolism ([place]) 1973  ? ?Past Surgical History:  ?Procedure Laterality Date  ? COLONOSCOPY WITH PROPOFOL N/A 04/07/2015  ? SLF: 1. seven colorectal polyps removed 2. the left colon is redundant 3. small internal hemorrhoids (1 simple adenoma, 6 hyperplastic)   ? CYST EXCISION Left   ? Arm  ? ESOPHAGOGASTRODUODENOSCOPY (EGD) WITH PROPOFOL N/A 04/07/2015  ? SLF: 1. Schatzki ring 2. Bravo capsule 34 cm from the teeth 3. moderate non-erosive gastritis.   ? MASS EXCISION N/A 2002  ? Lanyx  ? MASS EXCISION Left 01/21/2015  ? Procedure: EXCISION CYST LEFT UPPER ARM;  Surgeon: Aviva Signs, MD;  Location: [facility];  Service: General;  Laterality: Left;  ? MICROLARYNGOSCOPY N/A 05/31/2016  ? Procedure: MICRO LARYNGOSCOPY WITH BIOPSY OF VOCAL CORD LESION;  Surgeon: Leta Baptist, MD;  Location: [place] [facility];  Service: ENT;  Laterality: N/A;  ? POLYPECTOMY N/A 04/07/2015  ? Procedure: POLYPECTOMY;  Surgeon: Danie Binder, MD;  Location: [facility];  Service: Endoscopy;  Laterality: N/A;  Descending colon polyps x 3 ?  ? SAVORY DILATION N/A 04/07/2015  ? Procedure: SAVORY DILATION;  Surgeon: Danie Binder, MD;  Location: [facility];  Service: Endoscopy;  Laterality: N/A;  ? ? ?Social History:  reports that she has been smoking cigarettes. She has a 37.50 pack-year smoking history. She has never used smokeless tobacco. She reports current alcohol use. She reports current drug use. Drug: Marijuana. ? ? ?Allergies  ?Allergen Reactions  ? Gabapentin Other (See Comments)  ?  dizziness  ? Tetracyclines & Related Other (See Comments)  ?  Makes her sick on her stomach   ? ? ?Family History  ?Problem Relation Age of Onset  ? Pneumonia Mother   ? Aneurysm Father   ? Colon cancer Neg Hx   ?   ? ?Prior to Admission medications   ?Medication Sig Start Date End Date Taking? Authorizing Provider  ?amLODipine (NORVASC) 10 MG tablet Take 10 mg by mouth daily.  04/02/16   [provider]  ?baclofen (LIORESAL) 10 MG tablet Take 10 mg by mouth 3 (three) times daily as needed for muscle spasms.    [provider]  ?DULoxetine (CYMBALTA) 30 MG capsule Take 2 capsules by mouth daily. 06/07/21   [provider]  ?HYDROcodone-acetaminophen (NORCO) 10-325 MG tablet Take 0.5-1 tablets by mouth every 6 (six) hours as needed.  03/11/15   [provider]  ?lovastatin (MEVACOR) 20 MG tablet Take 20 mg by mouth daily.     [provider]  ?olmesartan (BENICAR) 20 MG tablet Take 20 mg by mouth daily.    [provider]  ?pantoprazole (PROTONIX) 40 MG tablet Take 40 mg by mouth daily. 06/08/21   [provider]  ?venlafaxine XR (EFFEXOR-XR) 150 MG 24 hr capsule Take 2 capsules by mouth every evening. 06/03/21   [provider]  ? ? ?Physical Exam: ?BP 111/72   Pulse 76   Temp 97.8 ?F (36.6 ?C) (Oral)   Resp 16   Ht '5\' 7"'$  (1.702 m)   Wt 81.6 kg   LMP 06/20/1997 (Approximate) Comment: post menopausal  SpO2 94%   BMI 28.19 kg/m?  ? ?General: 71 y.o. year-old female well developed well nourished in no acute distress.  Alert and oriented x3. ?HEENT: NCAT, EOMI ?Neck: Supple, trachea medial ?Cardiovascular: Regular rate and rhythm with no rubs or gallops.  No thyromegaly or JVD noted.  No lower extremity edema. 2/4 pulses in all 4 extremities. ?Respiratory: Clear to auscultation with no wheezes or rales. Good inspiratory effort. ?Abdomen: Soft, nontender nondistended with normal bowel sounds x4 quadrants. ?Muskuloskeletal: Right upper arm with swelling, tenderness to palpation and restricted movement due to pain. RUE in a sling.  ?Neuro: CN II-XII intact, sensation, reflexes intact ?Skin: No ulcerative lesions noted or rashes ?Psychiatry: Judgement and insight  appear normal. Mood is appropriate for condition and setting ?   ?   ?   ?Labs on Admission:  ?Basic Metabolic Panel: ?Recent Labs  ?Lab 06/23/21 ?2215  ?NA 135  ?K 4.0  ?CL 105  ?CO2 21*  ?GLUCOSE 97  ?BUN 17  ?CREATININE 1.03*  ?CALCIUM 9.2  ? ?Liver Function Tests: ?Recent Labs  ?Lab 06/24/21 ?0036  ?AST 18  ?ALT 19  ?ALKPHOS 72  ?BILITOT 0.4  ?PROT 6.3*  ?ALBUMIN 3.3*  ? ?No results for input(s): LIPASE, AMYLASE in the last 168 hours. ?No results for input(s): AMMONIA in the last 168 hours. ?CBC: ?Recent Labs  ?Lab 06/23/21 ?2215  ?WBC 13.6*  ?HGB 15.8*  ?HCT 46.1*  ?MCV 96.2  ?PLT 222  ? ?Cardiac Enzymes: ?No results for input(s): CKTOTAL, CKMB, CKMBINDEX, TROPONINI in the last 168 hours. ? ?BNP (  last 3 results) ?No results for input(s): BNP in the last 8760 hours. ? ?ProBNP (last 3 results) ?No results for input(s): PROBNP in the last 8760 hours. ? ?CBG: ?No results for input(s): GLUCAP in the last 168 hours. ? ?Radiological Exams on Admission: ?CT Head Wo Contrast ? ?Result Date: 06/24/2021 ?CLINICAL DATA:  Syncope, fall, head trauma EXAM: CT HEAD WITHOUT CONTRAST TECHNIQUE: Contiguous axial images were obtained from the base of the skull through the vertex without intravenous contrast. RADIATION DOSE REDUCTION: This exam was performed according to the departmental dose-optimization program which includes automated exposure control, adjustment of the mA and/or kV according to patient size and/or use of iterative reconstruction technique. COMPARISON:  06/28/2018 FINDINGS: Brain: Normal anatomic configuration. Parenchymal volume loss is commensurate with the patient's age. Moderate periventricular white matter changes are present likely reflecting the sequela of small vessel ischemia, progressive since prior examination. No abnormal intra or extra-axial mass lesion or fluid collection. No abnormal mass effect or midline shift. No evidence of acute intracranial hemorrhage or infarct. Ventricular size is normal.  Cerebellum unremarkable. Vascular: No asymmetric hyperdense vasculature at the skull base. Skull: Intact Sinuses/Orbits: There is near complete opacification of the left maxillary sinus, and air-fluid level within the right

## 2021-06-24 NOTE — Consult Note (Addendum)
ORTHOPAEDIC CONSULTATION  REQUESTING PHYSICIAN: Noralee Stain, DO  ASSESSMENT AND PLAN: 71 y.o. female with the following: Oblique fracture of right proximal 1/3 humerus with puckering of the skin in the anterior upper arm; fracture is palpable in this area.  Likely restricting ability to close reduce the fracture    Orthopedics recommends admission to a medical service as needed, and we will provide consultation and follow along  - Weight Bearing Status/Activity: NWB RUE  - Additional recommended labs/tests: Preoperative clearance, to include CBC, BMP, PT/INR, EKG, Chest XR  -VTE Prophylaxis: As needed  - Pain control: As needed, prefer PO medication  - Follow-up plan: TBD  -Procedures: Plan for operative fixation of right humerus fracture vs closed reduction; plan for 06/25/21.  Please keep patient NPO after midnight  Chief Complaint: Right arm pain  HPI: Laura Mcgee is a 71 y.o. female who had a syncopal episode prior to arrival, and was noted to have right arm pain.  She felt dizzy and like she was going to pass out.  She was able to sit down.  After a minutes, she attempted to get up again and she passed out.  When she woke up, she was on the floor.  She was brought to the ED and XR demonstrated a fracture of the right humerus.   Syncopal episode thus far has been negative.  She is independent, living alone.  She is right handed.  She has pain in her right arm with movement.  She remains comfortable, if she is not moving.  No numbness or tingling.  No prior injury to her right arm.   Past Medical History:  Diagnosis Date   Depression    Difficult intubation    History of laryngospasm   GERD (gastroesophageal reflux disease)    Hyperlipidemia    Hypertension    Numbness and tingling    Pulmonary embolism ([facility]) 1973   Past Surgical History:  Procedure Laterality Date   COLONOSCOPY WITH PROPOFOL N/A 04/07/2015   SLF: 1. seven colorectal polyps removed 2. the left  colon is redundant 3. small internal hemorrhoids (1 simple adenoma, 6 hyperplastic)    CYST EXCISION Left    Arm   ESOPHAGOGASTRODUODENOSCOPY (EGD) WITH PROPOFOL N/A 04/07/2015   SLF: 1. Schatzki ring 2. Bravo capsule 34 cm from the teeth 3. moderate non-erosive gastritis.    MASS EXCISION N/A 2002   Lanyx   MASS EXCISION Left 01/21/2015   Procedure: EXCISION CYST LEFT UPPER ARM;  Surgeon: Franky Macho, MD;  Location: [facility];  Service: General;  Laterality: Left;   MICROLARYNGOSCOPY N/A 05/31/2016   Procedure: MICRO LARYNGOSCOPY WITH BIOPSY OF VOCAL CORD LESION;  Surgeon: Newman Pies, MD;  Location: [facility];  Service: ENT;  Laterality: N/A;   POLYPECTOMY N/A 04/07/2015   Procedure: POLYPECTOMY;  Surgeon: West Bali, MD;  [facility];  Service: Endoscopy;  Laterality: N/A;  Descending colon polyps x 3    SAVORY DILATION N/A 04/07/2015   Procedure: SAVORY DILATION;  Surgeon: West Bali, MD;  Location: [facility];  Service: Endoscopy;  Laterality: N/A;   Social History   Socioeconomic History   Marital status: Single    Spouse name: Not on file   Number of children: 1   Years of education: some college   Highest education level: Not on file  Occupational History   Occupation: Retired  Tobacco Use   Smoking status: Every Day    Packs/day: 1.50  Years: 25.00    Pack years: 37.50    Types: Cigarettes   Smokeless tobacco: Never  Substance and Sexual Activity   Alcohol use: Yes    Comment: Drinks two beer per night.   Drug use: Yes    Types: Marijuana    Comment: daily marijuana use   Sexual activity: Not on file  Other Topics Concern   Not on file  Social History Narrative   Lives at home alone.   Right-handed.   Two cups sweet tea daily.   Social Determinants of Health   Financial Resource Strain: Not on file  Food Insecurity: Not on file  Transportation Needs: Not on file  Physical Activity: Not on file  Stress: Not on file  Social  Connections: Not on file   Family History  Problem Relation Age of Onset   Pneumonia Mother    Aneurysm Father    Colon cancer Neg Hx    Allergies  Allergen Reactions   Gabapentin Other (See Comments)    dizziness   Tetracyclines & Related Other (See Comments)    Makes her sick on her stomach    Prior to Admission medications   Medication Sig Start Date End Date Taking? Authorizing Provider  amLODipine (NORVASC) 10 MG tablet Take 10 mg by mouth daily.  04/02/16  Yes [provider]  DULoxetine (CYMBALTA) 30 MG capsule Take 2 capsules by mouth daily. 06/07/21  Yes [provider]  HYDROcodone-acetaminophen (NORCO) 10-325 MG tablet Take 0.5-1 tablets by mouth in the morning, at noon, and at bedtime. 03/11/15  Yes [provider]  lovastatin (MEVACOR) 20 MG tablet Take 20 mg by mouth daily.    Yes [provider]  olmesartan (BENICAR) 20 MG tablet Take 20 mg by mouth daily.   Yes [provider]  pantoprazole (PROTONIX) 40 MG tablet Take 40 mg by mouth daily. 06/08/21  Yes [provider]  venlafaxine XR (EFFEXOR-XR) 150 MG 24 hr capsule Take 2 capsules by mouth every evening. 06/03/21  Yes [provider]     CT Head Wo Contrast  Result Date: 06/24/2021 CLINICAL DATA:  Syncope, fall, head trauma EXAM: CT HEAD WITHOUT CONTRAST TECHNIQUE: Contiguous axial images were obtained from the base of the skull through the vertex without intravenous contrast. RADIATION DOSE REDUCTION: This exam was performed according to the departmental dose-optimization program which includes automated exposure control, adjustment of the mA and/or kV according to patient size and/or use of iterative reconstruction technique. COMPARISON:  06/28/2018 FINDINGS: Brain: Normal anatomic configuration. Parenchymal volume loss is commensurate with the patient's age. Moderate periventricular white matter changes are present likely reflecting the sequela of small vessel  ischemia, progressive since prior examination. No abnormal intra or extra-axial mass lesion or fluid collection. No abnormal mass effect or midline shift. No evidence of acute intracranial hemorrhage or infarct. Ventricular size is normal. Cerebellum unremarkable. Vascular: No asymmetric hyperdense vasculature at the skull base. Skull: Intact Sinuses/Orbits: There is near complete opacification of the left maxillary sinus, and air-fluid level within the right maxillary sinus, and a small air-fluid level seen within the left sphenoid sinus. Remaining paranasal sinuses are clear. Orbits are unremarkable. Other: Left mastoid air cells are fluid opacified inferiorly. Middle ear cavity is clear. Right mastoid air cells and middle ear cavities are clear. IMPRESSION: No acute intracranial injury.  No calvarial fracture. Progressive moderate senescent change. Moderate bilateral paranasal sinus disease Left mastoid effusion Electronically Signed   By: Lyda Kalata.D.  On: 06/24/2021 00:20   US Carotid Bilateral  Result Date: 06/24/2021 CLINICAL DATA:  71 year old female with history of syncopal episode. EXAM: BILATERAL CAROTID DUPLEX ULTRASOUND TECHNIQUE: Wallace Cullens scale imaging, color Doppler and duplex ultrasound were performed of bilateral carotid and vertebral arteries in the neck. COMPARISON:  None. FINDINGS: Criteria: Quantification of carotid stenosis is based on velocity parameters that correlate the residual internal carotid diameter with NASCET-based stenosis levels, using the diameter of the distal internal carotid lumen as the denominator for stenosis measurement. The following velocity measurements were obtained: RIGHT ICA: Peak systolic velocity 93 cm/sec, End diastolic velocity 24 cm/sec CCA: Peak systolic velocity 68 cm/sec SYSTOLIC ICA/CCA RATIO:  1.4 ECA: Peak systolic velocity 80 cm/sec LEFT ICA: Peak systolic velocity 112 cm/sec, End diastolic velocity 19 cm/sec CCA: 61 cm/sec SYSTOLIC ICA/CCA RATIO:   1.8 ECA: 194 cm/sec RIGHT CAROTID ARTERY: Mild multifocal atherosclerotic plaque formation, most prominent about the carotid bulb and bifurcation. No significant tortuosity. Normal low resistance waveforms. RIGHT VERTEBRAL ARTERY:  Antegrade flow. LEFT CAROTID ARTERY: Moderate multifocal atherosclerotic plaque formation, most prominent about the carotid bulb. No significant tortuosity. Normal low resistance waveforms. LEFT VERTEBRAL ARTERY:  Antegrade flow. Upper extremity non-invasive blood pressures: Not obtained. IMPRESSION: 1. Right carotid artery system: Less than 50% stenosis secondary to mild multifocal atherosclerotic plaque formation. 2. Left carotid artery system: Less than 50% stenosis secondary to moderate multifocal atherosclerotic plaque formation, most prominent about the carotid bulb. 3.  Vertebral artery system: Patent with antegrade flow bilaterally. Marliss Coots, MD [facility] Electronically Signed   By: Marliss Coots M.D.   On: 06/24/2021 10:19   DG Chest Port 1 View  Result Date: 06/24/2021 CLINICAL DATA:  Fall. EXAM: PORTABLE CHEST 1 VIEW COMPARISON:  Chest x-ray 08/24/2018. FINDINGS: There is minimal left basilar atelectasis. There is no pleural effusion or pneumothorax. Cardiomediastinal silhouette is within normal limits. No acute fractures are identified. IMPRESSION: 1. Minimal left base atelectasis. Electronically Signed   By: Darliss Cheney M.D.   On: 06/24/2021 00:15   DG Humerus Right  Result Date: 06/23/2021 CLINICAL DATA:  Status post fall. EXAM: RIGHT HUMERUS - 2+ VIEW COMPARISON:  None. FINDINGS: Acute fracture deformity is seen involving the proximal shaft of the right humerus. Approximately 1 shaft width medial displacement of the distal fracture site is seen. There is no evidence of dislocation. Moderate severity soft tissue swelling is seen along the previously noted fracture site. IMPRESSION: Acute fracture of  the proximal right humerus. Electronically Signed   By: Aram Candela M.D.   On: 06/23/2021 21:52      Family History Reviewed and non-contributory, no pertinent history of problems with bleeding or anesthesia    Review of Systems No fevers or chills No numbness or tingling No chest pain No shortness of breath No bowel or bladder dysfunction No GI distress No headaches +loss consciousness No hallucinations No loss of appetite.     OBJECTIVE  Vitals:Patient Vitals for the past 8 hrs:  BP Pulse Resp SpO2  06/24/21 1000 122/85 83 17 95 %  06/24/21 0700 111/72 76 16 94 %  06/24/21 0600 131/81 80 20 94 %  06/24/21 0500 119/79 73 17 90 %   General: Alert, no acute distress Cardiovascular: Warm extremities noted Respiratory: No cyanosis, no use of accessory musculature GI: No organomegaly, abdomen is soft and non-tender Skin: No lesions in the area of chief complaint other than those listed below in MSK exam.  Neurologic: Sensation  intact distally save for the below mentioned MSK exam Psychiatric: Patient is competent for consent with normal mood and affect Lymphatic: No swelling obvious and reported other than the area involved in the exam below Extremities  ZOX:[id number] to right arm.  Pain with movement.  Splint on posterior arm is clean, dry and intact.  No numbness or tingling.  Active motion intact to the right hand.  She can extend her wrist and all fingers.  +thumbs up.  Fingers are warm and well perfused.  She has a dimple of the skin over the anterior arm with fracture palpable in this area   Test Results Imaging XR of the right arm demonstrates an oblique fracture through the proximal 1/3 humerus shaft with lateral displacement.  Shoulder is reduced.   Labs cbc Recent Labs    06/23/21 2215  WBC 13.6*  HGB 15.8*  HCT 46.1*  PLT 222    Labs inflam No results for input(s): CRP in the last 72 hours.  Invalid input(s): ESR  Labs coag No results for  input(s): INR, PTT in the last 72 hours.  Invalid input(s): PT  Recent Labs    06/23/21 2215  NA 135  K 4.0  CL 105  CO2 21*  GLUCOSE 97  BUN 17  CREATININE 1.03*  CALCIUM 9.2

## 2021-06-24 NOTE — ED Notes (Signed)
Patient given gingerale and graham crackers 

## 2021-06-24 NOTE — ED Provider Notes (Signed)
?[place] ?Provider Note ? ? ?CSN: [id number] ?Arrival date & time: 06/23/21  2021 ? ?  ? ?History ? ?Chief Complaint  ?Patient presents with  ? Fall  ? ? ?Laura Mcgee is a 71 y.o. female. ? ?Patient presents to the emergency department for evaluation of syncope.  Patient reports that she had been feeling well during the day but then tonight she got up to walk and felt very dizzy.  She had to hold onto the wall and fell like she was going to pass out.  She was able to sit down for a while and felt better.  She then stood up to walk again and the next thing she knew she was on the ground, had passed out.  Patient complaining of right arm pain after the fall.  No chest pain, no perceived shortness of breath, heart palpitations. ? ? ?  ? ?Home Medications ?Prior to Admission medications   ?Medication Sig Start Date End Date Taking? Authorizing Provider  ?amLODipine (NORVASC) 10 MG tablet Take 10 mg by mouth daily.  04/02/16   [provider]  ?baclofen (LIORESAL) 10 MG tablet Take 10 mg by mouth 3 (three) times daily as needed for muscle spasms.    [provider]  ?HYDROcodone-acetaminophen (NORCO) 10-325 MG tablet Take 0.5-1 tablets by mouth every 6 (six) hours as needed.  03/11/15   [provider]  ?lovastatin (MEVACOR) 20 MG tablet Take 20 mg by mouth daily.     [provider]  ?olmesartan (BENICAR) 20 MG tablet Take 20 mg by mouth daily.    [provider]  ?venlafaxine (EFFEXOR) 75 MG tablet Take 75 mg by mouth 2 (two) times daily.    [provider]  ?   ? ?Allergies    ?Gabapentin and Tetracyclines & related   ? ?Review of Systems   ?Review of Systems  ?Neurological:  Positive for syncope.  ? ?Physical Exam ?Updated Vital Signs ?BP 119/79   Pulse 73   Temp 97.8 ?F (36.6 ?C) (Oral)   Resp 17   Ht '5\' 7"'$  (1.702 m)   Wt 81.6 kg   LMP 06/20/1997 (Approximate) Comment: post menopausal  SpO2 90%   BMI 28.19 kg/m?  ?Physical  Exam ?Vitals and nursing note reviewed.  ?Constitutional:   ?   General: She is not in acute distress. ?   Appearance: She is well-developed.  ?HENT:  ?   Head: Normocephalic and atraumatic.  ?   Mouth/Throat:  ?   Mouth: Mucous membranes are moist.  ?Eyes:  ?   General: Vision grossly intact. Gaze aligned appropriately.  ?   Extraocular Movements: Extraocular movements intact.  ?   Conjunctiva/sclera: Conjunctivae normal.  ?Cardiovascular:  ?   Rate and Rhythm: Normal rate and regular rhythm.  ?   Pulses: Normal pulses.  ?   Heart sounds: Normal heart sounds, S1 normal and S2 normal. No murmur heard. ?  No friction rub. No gallop.  ?Pulmonary:  ?   Effort: Pulmonary effort is normal. No respiratory distress.  ?   Breath sounds: Normal breath sounds.  ?Abdominal:  ?   General: Bowel sounds are normal.  ?   Palpations: Abdomen is soft.  ?   Tenderness: There is no abdominal tenderness. There is no guarding or rebound.  ?   Hernia: No hernia is present.  ?Musculoskeletal:     ?   General: No swelling.  ?   Right upper arm: Swelling and tenderness  present.  ?   Cervical back: Full passive range of motion without pain, normal range of motion and neck supple. No spinous process tenderness or muscular tenderness. Normal range of motion.  ?   Right lower leg: No edema.  ?   Left lower leg: No edema.  ?Skin: ?   General: Skin is warm and dry.  ?   Capillary Refill: Capillary refill takes less than 2 seconds.  ?   Findings: No ecchymosis, erythema, rash or wound.  ?Neurological:  ?   General: No focal deficit present.  ?   Mental Status: She is alert and oriented to person, place, and time.  ?   GCS: GCS eye subscore is 4. GCS verbal subscore is 5. GCS motor subscore is 6.  ?   Cranial Nerves: Cranial nerves 2-12 are intact.  ?   Sensory: Sensation is intact.  ?   Motor: Motor function is intact.  ?   Coordination: Coordination is intact.  ?Psychiatric:     ?   Attention and Perception: Attention normal.     ?   Mood and  Affect: Mood normal.     ?   Speech: Speech normal.     ?   Behavior: Behavior normal.  ? ? ?ED Results / Procedures / Treatments   ?Labs ?(all labs ordered are listed, but only abnormal results are displayed) ?Labs Reviewed  ?CBC - Abnormal; Notable for the following components:  ?    Result Value  ? WBC 13.6 (*)   ? Hemoglobin 15.8 (*)   ? HCT 46.1 (*)   ? All other components within normal limits  ?BASIC METABOLIC PANEL - Abnormal; Notable for the following components:  ? CO2 21 (*)   ? Creatinine, Ser 1.03 (*)   ? GFR, Estimated 58 (*)   ? All other components within normal limits  ?HEPATIC FUNCTION PANEL - Abnormal; Notable for the following components:  ? Total Protein 6.3 (*)   ? Albumin 3.3 (*)   ? All other components within normal limits  ?LACTIC ACID, PLASMA  ?LACTIC ACID, PLASMA  ?URINALYSIS, ROUTINE W REFLEX MICROSCOPIC  ? ? ?EKG ?None ? ?Radiology ?CT Head Wo Contrast ? ?Result Date: 06/24/2021 ?CLINICAL DATA:  Syncope, fall, head trauma EXAM: CT HEAD WITHOUT CONTRAST TECHNIQUE: Contiguous axial images were obtained from the base of the skull through the vertex without intravenous contrast. RADIATION DOSE REDUCTION: This exam was performed according to the departmental dose-optimization program which includes automated exposure control, adjustment of the mA and/or kV according to patient size and/or use of iterative reconstruction technique. COMPARISON:  06/28/2018 FINDINGS: Brain: Normal anatomic configuration. Parenchymal volume loss is commensurate with the patient's age. Moderate periventricular white matter changes are present likely reflecting the sequela of small vessel ischemia, progressive since prior examination. No abnormal intra or extra-axial mass lesion or fluid collection. No abnormal mass effect or midline shift. No evidence of acute intracranial hemorrhage or infarct. Ventricular size is normal. Cerebellum unremarkable. Vascular: No asymmetric hyperdense vasculature at the skull base.  Skull: Intact Sinuses/Orbits: There is near complete opacification of the left maxillary sinus, and air-fluid level within the right maxillary sinus, and a small air-fluid level seen within the left sphenoid sinus. Remaining paranasal sinuses are clear. Orbits are unremarkable. Other: Left mastoid air cells are fluid opacified inferiorly. Middle ear cavity is clear. Right mastoid air cells and middle ear cavities are clear. IMPRESSION: No acute intracranial injury.  No calvarial fracture. Progressive moderate senescent change.  Moderate bilateral paranasal sinus disease Left mastoid effusion Electronically Signed   By: Fidela Salisbury M.D.   On: 06/24/2021 00:20  ? ?DG Chest Port 1 View ? ?Result Date: 06/24/2021 ?CLINICAL DATA:  Fall. EXAM: PORTABLE CHEST 1 VIEW COMPARISON:  Chest x-ray 08/24/2018. FINDINGS: There is minimal left basilar atelectasis. There is no pleural effusion or pneumothorax. Cardiomediastinal silhouette is within normal limits. No acute fractures are identified. IMPRESSION: 1. Minimal left base atelectasis. Electronically Signed   By: Ronney Asters M.D.   On: 06/24/2021 00:15  ? ?DG Humerus Right ? ?Result Date: 06/23/2021 ?CLINICAL DATA:  Status post fall. EXAM: RIGHT HUMERUS - 2+ VIEW COMPARISON:  None. FINDINGS: Acute fracture deformity is seen involving the proximal shaft of the right humerus. Approximately 1 shaft width medial displacement of the distal fracture site is seen. There is no evidence of dislocation. Moderate severity soft tissue swelling is seen along the previously noted fracture site. IMPRESSION: Acute fracture of the proximal right humerus. Electronically Signed   By: Virgina Norfolk M.D.   On: 06/23/2021 21:52   ? ?Procedures ?Procedures  ? ? ?Medications Ordered in ED ?Medications  ?fentaNYL (SUBLIMAZE) injection 25 mcg (25 mcg Intravenous Given 06/23/21 2338)  ?sodium chloride 0.9 % bolus 1,000 mL (0 mLs Intravenous Stopped 06/24/21 0032)  ?fentaNYL (SUBLIMAZE) injection 25  mcg (25 mcg Intravenous Given 06/24/21 0241)  ? ? ?ED Course/ Medical Decision Making/ A&P ?  ?                        ?Medical Decision Making ?Amount and/or Complexity of Data Reviewed ?Labs: ordered. ?Radiology: ordered

## 2021-06-25 ENCOUNTER — Other Ambulatory Visit: Payer: Self-pay

## 2021-06-25 ENCOUNTER — Encounter (HOSPITAL_COMMUNITY)
Admission: EM | Disposition: A | Discharge status: Skilled Nursing Facility | Payer: Self-pay | Source: Home / Self Care | Attending: Internal Medicine

## 2021-06-25 ENCOUNTER — Inpatient Hospital Stay (HOSPITAL_COMMUNITY): Payer: Medicare Other | Admitting: Certified Registered Nurse Anesthetist

## 2021-06-25 ENCOUNTER — Inpatient Hospital Stay (HOSPITAL_COMMUNITY): Payer: Medicare Other

## 2021-06-25 ENCOUNTER — Encounter (HOSPITAL_COMMUNITY): Payer: Self-pay | Admitting: Internal Medicine

## 2021-06-25 DIAGNOSIS — F1010 Alcohol abuse, uncomplicated: Secondary | ICD-10-CM

## 2021-06-25 DIAGNOSIS — S42301A Unspecified fracture of shaft of humerus, right arm, initial encounter for closed fracture: Secondary | ICD-10-CM

## 2021-06-25 DIAGNOSIS — F1020 Alcohol dependence, uncomplicated: Secondary | ICD-10-CM

## 2021-06-25 DIAGNOSIS — I10 Essential (primary) hypertension: Secondary | ICD-10-CM

## 2021-06-25 DIAGNOSIS — R55 Syncope and collapse: Secondary | ICD-10-CM | POA: Diagnosis not present

## 2021-06-25 DIAGNOSIS — G709 Myoneural disorder, unspecified: Secondary | ICD-10-CM

## 2021-06-25 DIAGNOSIS — S42201A Unspecified fracture of upper end of right humerus, initial encounter for closed fracture: Secondary | ICD-10-CM

## 2021-06-25 DIAGNOSIS — F17210 Nicotine dependence, cigarettes, uncomplicated: Secondary | ICD-10-CM

## 2021-06-25 HISTORY — PX: HUMERUS IM NAIL: SHX1769

## 2021-06-25 LAB — PROTIME-INR
INR: 1 (ref 0.8–1.2)
Prothrombin Time: 12.8 seconds (ref 11.4–15.2)

## 2021-06-25 LAB — COMPREHENSIVE METABOLIC PANEL
ALT: 18 U/L (ref 0–44)
AST: 18 U/L (ref 15–41)
Albumin: 3.1 g/dL — ABNORMAL LOW (ref 3.5–5.0)
Alkaline Phosphatase: 73 U/L (ref 38–126)
Anion gap: 8 (ref 5–15)
BUN: 9 mg/dL (ref 8–23)
CO2: 21 mmol/L — ABNORMAL LOW (ref 22–32)
Calcium: 8.6 mg/dL — ABNORMAL LOW (ref 8.9–10.3)
Chloride: 107 mmol/L (ref 98–111)
Creatinine, Ser: 0.51 mg/dL (ref 0.44–1.00)
GFR, Estimated: >60 mL/min (ref >60)
Glucose, Bld: 99 mg/dL (ref 70–99)
Potassium: 3.2 mmol/L — ABNORMAL LOW (ref 3.5–5.1)
Sodium: 136 mmol/L (ref 135–145)
Total Bilirubin: 0.6 mg/dL (ref 0.3–1.2)
Total Protein: 6.2 g/dL — ABNORMAL LOW (ref 6.5–8.1)

## 2021-06-25 LAB — CBC
HCT: 45.1 % (ref 36.0–46.0)
Hemoglobin: 14.7 g/dL (ref 12.0–15.0)
MCH: 32.4 pg (ref 26.0–34.0)
MCHC: 32.6 g/dL (ref 30.0–36.0)
MCV: 99.3 fL (ref 80.0–100.0)
Platelets: 197 10*3/uL (ref 150–400)
RBC: 4.54 MIL/uL (ref 3.87–5.11)
RDW: 14.9 % (ref 11.5–15.5)
WBC: 9.7 10*3/uL (ref 4.0–10.5)
nRBC: 0 % (ref 0.0–0.2)

## 2021-06-25 LAB — MAGNESIUM: Magnesium: 1.9 mg/dL (ref 1.7–2.4)

## 2021-06-25 SURGERY — INSERTION, INTRAMEDULLARY ROD, HUMERUS
Anesthesia: General | Site: Arm Upper | Laterality: Right

## 2021-06-25 MED ORDER — PROPOFOL 10 MG/ML IV BOLUS
INTRAVENOUS | Status: AC
  Filled 2021-06-25: qty 20

## 2021-06-25 MED ORDER — LORAZEPAM 1 MG PO TABS
1.0000 mg | ORAL_TABLET | ORAL | Status: AC | PRN
  Administered 2021-06-25 – 2021-06-26 (×2): 1 mg via ORAL
  Filled 2021-06-25 (×2): qty 1

## 2021-06-25 MED ORDER — FENTANYL CITRATE PF 50 MCG/ML IJ SOSY: 25.0000 ug | PREFILLED_SYRINGE | INTRAMUSCULAR | Status: DC | PRN

## 2021-06-25 MED ORDER — CHLORHEXIDINE GLUCONATE 0.12 % MT SOLN
15.0000 mL | Freq: Once | OROMUCOSAL | Status: AC
  Administered 2021-06-25: 15 mL via OROMUCOSAL
  Filled 2021-06-25: qty 15

## 2021-06-25 MED ORDER — PHENYLEPHRINE HCL-NACL 20-0.9 MG/250ML-% IV SOLN
INTRAVENOUS | Status: DC | PRN
  Administered 2021-06-25: 20 ug/min via INTRAVENOUS

## 2021-06-25 MED ORDER — SUGAMMADEX SODIUM 500 MG/5ML IV SOLN
INTRAVENOUS | Status: DC | PRN
Start: 2021-06-25 — End: 2021-06-25
  Administered 2021-06-25: 150 mg via INTRAVENOUS

## 2021-06-25 MED ORDER — LIDOCAINE HCL (CARDIAC) PF 100 MG/5ML IV SOSY
PREFILLED_SYRINGE | INTRAVENOUS | Status: DC | PRN
  Administered 2021-06-25: 40 mg via INTRATRACHEAL

## 2021-06-25 MED ORDER — DEXAMETHASONE SODIUM PHOSPHATE 10 MG/ML IJ SOLN
INTRAMUSCULAR | Status: DC | PRN
  Administered 2021-06-25: 5 mg via INTRAVENOUS

## 2021-06-25 MED ORDER — LACTATED RINGERS IV SOLN: INTRAVENOUS | Status: DC

## 2021-06-25 MED ORDER — LACTATED RINGERS IV SOLN: INTRAVENOUS | Status: DC | PRN

## 2021-06-25 MED ORDER — LORAZEPAM 2 MG/ML IJ SOLN: 1.0000 mg | INTRAMUSCULAR | Status: AC | PRN

## 2021-06-25 MED ORDER — THIAMINE HCL 100 MG/ML IJ SOLN
100.0000 mg | Freq: Every day | INTRAMUSCULAR | Status: DC
  Filled 2021-06-25: qty 2

## 2021-06-25 MED ORDER — PROPOFOL 10 MG/ML IV BOLUS
INTRAVENOUS | Status: DC | PRN
  Administered 2021-06-25: 150 mg via INTRAVENOUS
  Administered 2021-06-25: 50 mg via INTRAVENOUS

## 2021-06-25 MED ORDER — LIDOCAINE HCL (PF) 2 % IJ SOLN
INTRAMUSCULAR | Status: AC
  Filled 2021-06-25: qty 5

## 2021-06-25 MED ORDER — BUPIVACAINE HCL (PF) 0.5 % IJ SOLN
INTRAMUSCULAR | Status: DC | PRN
  Administered 2021-06-25: 30 mL

## 2021-06-25 MED ORDER — ROCURONIUM BROMIDE 10 MG/ML (PF) SYRINGE
PREFILLED_SYRINGE | INTRAVENOUS | Status: DC | PRN
  Administered 2021-06-25 (×2): 50 mg via INTRAVENOUS

## 2021-06-25 MED ORDER — THIAMINE HCL 100 MG PO TABS
100.0000 mg | ORAL_TABLET | Freq: Every day | ORAL | Status: DC
  Administered 2021-06-25 – 2021-06-28 (×4): 100 mg via ORAL
  Filled 2021-06-25 (×4): qty 1

## 2021-06-25 MED ORDER — ALBUTEROL SULFATE HFA 108 (90 BASE) MCG/ACT IN AERS
INHALATION_SPRAY | RESPIRATORY_TRACT | Status: DC | PRN
  Administered 2021-06-25: 8 via RESPIRATORY_TRACT

## 2021-06-25 MED ORDER — ADULT MULTIVITAMIN W/MINERALS CH
1.0000 | ORAL_TABLET | Freq: Every day | ORAL | Status: DC
  Administered 2021-06-25 – 2021-06-28 (×4): 1 via ORAL
  Filled 2021-06-25 (×4): qty 1

## 2021-06-25 MED ORDER — STERILE WATER FOR IRRIGATION IR SOLN
Status: DC | PRN
  Administered 2021-06-25: 1000 mL

## 2021-06-25 MED ORDER — FENTANYL CITRATE (PF) 250 MCG/5ML IJ SOLN
INTRAMUSCULAR | Status: AC
  Filled 2021-06-25: qty 5

## 2021-06-25 MED ORDER — FOLIC ACID 1 MG PO TABS
1.0000 mg | ORAL_TABLET | Freq: Every day | ORAL | Status: DC
  Administered 2021-06-25 – 2021-06-28 (×4): 1 mg via ORAL
  Filled 2021-06-25 (×3): qty 1

## 2021-06-25 MED ORDER — FENTANYL CITRATE (PF) 100 MCG/2ML IJ SOLN
INTRAMUSCULAR | Status: DC | PRN
  Administered 2021-06-25: 20 ug via INTRAVENOUS
  Administered 2021-06-25: 50 ug via INTRAVENOUS
  Administered 2021-06-25: 100 ug via INTRAVENOUS
  Administered 2021-06-25: 25 ug via INTRAVENOUS
  Administered 2021-06-25: 50 ug via INTRAVENOUS

## 2021-06-25 MED ORDER — PHENYLEPHRINE HCL (PRESSORS) 10 MG/ML IV SOLN
INTRAVENOUS | Status: DC | PRN
  Administered 2021-06-25: 80 ug via INTRAVENOUS

## 2021-06-25 MED ORDER — VANCOMYCIN HCL 1000 MG IV SOLR
INTRAVENOUS | Status: DC | PRN
  Administered 2021-06-25: 1000 mg

## 2021-06-25 MED ORDER — SUCCINYLCHOLINE CHLORIDE 200 MG/10ML IV SOSY
PREFILLED_SYRINGE | INTRAVENOUS | Status: DC | PRN
  Administered 2021-06-25: 120 mg via INTRAVENOUS

## 2021-06-25 MED ORDER — ORAL CARE MOUTH RINSE: 15.0000 mL | Freq: Once | OROMUCOSAL | Status: AC

## 2021-06-25 MED ORDER — ONDANSETRON HCL 4 MG/2ML IJ SOLN
INTRAMUSCULAR | Status: DC | PRN
  Administered 2021-06-25: 4 mg via INTRAVENOUS

## 2021-06-25 MED ORDER — METOPROLOL TARTRATE 5 MG/5ML IV SOLN
INTRAVENOUS | Status: DC | PRN
  Administered 2021-06-25: 2 mg via INTRAVENOUS

## 2021-06-25 MED ORDER — POTASSIUM CHLORIDE CRYS ER 20 MEQ PO TBCR
40.0000 meq | EXTENDED_RELEASE_TABLET | ORAL | Status: AC
  Administered 2021-06-25 (×2): 40 meq via ORAL
  Filled 2021-06-25 (×2): qty 2

## 2021-06-25 MED ORDER — BUPIVACAINE HCL (PF) 0.5 % IJ SOLN
INTRAMUSCULAR | Status: AC
  Filled 2021-06-25: qty 60

## 2021-06-25 MED ORDER — SODIUM CHLORIDE 0.9 % IR SOLN
Status: DC | PRN
  Administered 2021-06-25: 1000 mL

## 2021-06-25 MED ORDER — CEFAZOLIN SODIUM-DEXTROSE 2-4 GM/100ML-% IV SOLN
INTRAVENOUS | Status: AC
Start: 2021-06-25 — End: 2021-06-25
  Filled 2021-06-25: qty 100

## 2021-06-25 MED ORDER — ALBUTEROL SULFATE HFA 108 (90 BASE) MCG/ACT IN AERS
INHALATION_SPRAY | RESPIRATORY_TRACT | Status: AC
  Filled 2021-06-25: qty 6.7

## 2021-06-25 MED ORDER — CEFAZOLIN SODIUM-DEXTROSE 2-4 GM/100ML-% IV SOLN
2.0000 g | Freq: Three times a day (TID) | INTRAVENOUS | Status: AC
Start: 2021-06-25 — End: 2021-06-26
  Administered 2021-06-25 – 2021-06-26 (×3): 2 g via INTRAVENOUS
  Filled 2021-06-25 (×3): qty 100

## 2021-06-25 MED ORDER — VANCOMYCIN HCL 1000 MG IV SOLR
INTRAVENOUS | Status: AC
  Filled 2021-06-25: qty 20

## 2021-06-25 MED ORDER — CHLORHEXIDINE GLUCONATE 0.12 % MT SOLN
OROMUCOSAL | Status: AC
  Filled 2021-06-25: qty 15

## 2021-06-25 MED ORDER — ONDANSETRON HCL 4 MG/2ML IJ SOLN: 4.0000 mg | Freq: Once | INTRAMUSCULAR | Status: DC | PRN

## 2021-06-25 SURGICAL SUPPLY — 66 items
APL PRP STRL LF DISP 70% ISPRP (MISCELLANEOUS) ×2
BANDAGE ELASTIC 4 VELCRO NS (GAUZE/BANDAGES/DRESSINGS) ×2 IMPLANT
BANDAGE ESMARK 6X9 LF (GAUZE/BANDAGES/DRESSINGS) ×1 IMPLANT
BIT DRILL AO 2.9X100 (BIT) ×2 IMPLANT
BIT DRILL CALB 3.5X180 (BIT) ×2 IMPLANT
BLADE SURG SZ10 CARB STEEL (BLADE) ×3 IMPLANT
BNDG CMPR 9X6 STRL LF SNTH (GAUZE/BANDAGES/DRESSINGS) ×2
BNDG COHESIVE 4X5 TAN STRL (GAUZE/BANDAGES/DRESSINGS) ×3 IMPLANT
BNDG ESMARK 6X9 LF (GAUZE/BANDAGES/DRESSINGS) ×3
CHLORAPREP W/TINT 26 (MISCELLANEOUS) ×4 IMPLANT
CLOTH BEACON ORANGE TIMEOUT ST (SAFETY) ×3 IMPLANT
COVER LIGHT HANDLE STERIS (MISCELLANEOUS) ×6 IMPLANT
COVER MAYO STAND XLG (MISCELLANEOUS) ×3 IMPLANT
DRAPE C-ARM FOLDED MOBILE STRL (DRAPES) ×4 IMPLANT
DRAPE C-ARMOR (DRAPES) ×2 IMPLANT
DRAPE INCISE IOBAN 44X35 STRL (DRAPES) ×2 IMPLANT
DRAPE ORTHO 2.5IN SPLIT 77X108 (DRAPES) ×4 IMPLANT
DRAPE ORTHO SPLIT 77X108 STRL (DRAPES) ×6
DRSG TEGADERM 4X4.75 (GAUZE/BANDAGES/DRESSINGS) ×8 IMPLANT
ELECT REM PT RETURN 9FT ADLT (ELECTROSURGICAL) ×3
ELECTRODE REM PT RTRN 9FT ADLT (ELECTROSURGICAL) ×2 IMPLANT
FIBERTAPE CERCLAGE TLINK SUT (SUTURE) ×2 IMPLANT
GAUZE SPONGE 4X4 12PLY STRL (GAUZE/BANDAGES/DRESSINGS) ×2 IMPLANT
GAUZE XEROFORM 1X8 LF (GAUZE/BANDAGES/DRESSINGS) ×2 IMPLANT
GLOVE BIOGEL PI IND STRL 7.0 (GLOVE) ×6 IMPLANT
GLOVE BIOGEL PI IND STRL 8 (GLOVE) ×1 IMPLANT
GLOVE BIOGEL PI INDICATOR 7.0 (GLOVE) ×4
GLOVE BIOGEL PI INDICATOR 8 (GLOVE) ×1
GLOVE SS BIOGEL STRL SZ 6.5 (GLOVE) ×1 IMPLANT
GLOVE SUPERSENSE BIOGEL SZ 6.5 (GLOVE) ×1
GLOVE SURG SS PI 6.5 STRL IVOR (GLOVE) ×2 IMPLANT
GLOVE SURG SS PI 7.0 STRL IVOR (GLOVE) ×4 IMPLANT
GLOVE SURG SS PI 8.0 STRL IVOR (GLOVE) ×6 IMPLANT
GOWN STRL REUS W/ TWL XL LVL3 (GOWN DISPOSABLE) ×4 IMPLANT
GOWN STRL REUS W/TWL LRG LVL3 (GOWN DISPOSABLE) ×4 IMPLANT
GOWN STRL REUS W/TWL XL LVL3 (GOWN DISPOSABLE) ×6
GUIDEWIRE BALL NOSE 2.0X800 (WIRE) ×2 IMPLANT
INST SET MINOR BONE (KITS) ×3 IMPLANT
KIT TURNOVER KIT A (KITS) ×3 IMPLANT
MANIFOLD NEPTUNE II (INSTRUMENTS) ×3 IMPLANT
MARKER SKIN DUAL TIP RULER LAB (MISCELLANEOUS) ×3 IMPLANT
NAIL HUM 7X25 (Nail) ×2 IMPLANT
NAIL HUM 8X25 (Nail) ×2 IMPLANT
NDL HYPO 21X1.5 SAFETY (NEEDLE) ×1 IMPLANT
NEEDLE HYPO 21X1.5 SAFETY (NEEDLE) ×3 IMPLANT
NS IRRIG 1000ML POUR BTL (IV SOLUTION) ×3 IMPLANT
PACK BASIC LIMB (CUSTOM PROCEDURE TRAY) ×3 IMPLANT
PAD ARMBOARD 7.5X6 YLW CONV (MISCELLANEOUS) ×3 IMPLANT
PIN GUIDE THRD AR 3.2X330 (PIN) ×2 IMPLANT
SCREW BN 25X3.5XCORT (Screw) ×1 IMPLANT
SCREW CANC BONE 5.0X30 (Screw) ×2 IMPLANT
SCREW CANC BONE 5.0X45 (Screw) ×2 IMPLANT
SCREW CORT 3.5X25 (Screw) ×3 IMPLANT
SET BASIN LINEN APH (SET/KITS/TRAYS/PACK) ×3 IMPLANT
SPONGE T-LAP 18X18 ~~LOC~~+RFID (SPONGE) ×5 IMPLANT
STAPLER VISISTAT (STAPLE) ×3 IMPLANT
SUT MON AB 0 CT1 (SUTURE) ×4 IMPLANT
SUT MON AB 2-0 SH 27 (SUTURE) ×6
SUT MON AB 2-0 SH27 (SUTURE) ×3 IMPLANT
SUT VIC AB 1 CT1 27 (SUTURE) ×6
SUT VIC AB 1 CT1 27XBRD ANTBC (SUTURE) ×3 IMPLANT
SYR 30ML LL (SYRINGE) ×3 IMPLANT
SYR BULB IRRIG 60ML STRL (SYRINGE) ×4 IMPLANT
TOWEL OR 17X26 4PK STRL BLUE (TOWEL DISPOSABLE) ×4 IMPLANT
WATER STERILE IRR 1000ML POUR (IV SOLUTION) ×2 IMPLANT
YANKAUER SUCT BULB TIP 10FT TU (MISCELLANEOUS) ×3 IMPLANT

## 2021-06-25 NOTE — Progress Notes (Signed)
ORTHOPAEDIC PROGRESS NOTE ? ?Scheduled for Procedure(s): ?INTRAMEDULLARY (IM) NAIL HUMERAL - R humerus ? ? ?DOS: 06/25/2021 ? ?SUBJECTIVE: ?Did not sleep well over night.  She struggled with pain and getting comfortable.  She also does not like the wick catheter.  Once again, we discussed the treatment plan, and she like to proceed with operative fixation, to include internal fixation. ? ?OBJECTIVE: ?PE: ? ?Alert and oriented.  No acute distress. ? ?Right arm is in a splint, obvious deformity to the upper arm.  Splint is clean, dry and intact.  Fingers are warm and well-perfused.  She has full active extension of all fingers.  2+ radial pulse. ? ?Vitals:  ? 06/24/21 2135 06/25/21 0459  ?BP: (!) 147/80 (!) 150/89  ?Pulse: 76 88  ?Resp: 20 18  ?Temp: (!) 97.5 ?F (36.4 ?C) 97.8 ?F (36.6 ?C)  ?SpO2: 93% 93%  ? ? ? ?ASSESSMENT: ?Laura Mcgee is a 71 y.o. female stable, ready for surgery.  NPO since midnight. ? ?PLAN: ?Weightbearing: NWB RUE ?Insicional and dressing care: Reinforce dressings as needed; none currently ?Orthopedic device(s): Splint and sling to right upper arm ?VTE prophylaxis: None currently, please hold until POD#1 ?Pain control: PRN medications, judicious use of narcotics ?Follow - up plan: 2 weeks postop ? ? ?Contact information:   ? ? ?Miyonna Ormiston A. Amedeo Kinsman, MD MS ?[facility] ?[address] ?[place],  [place]  [address] ?Phone: [phone] ?Fax: [phone] ? ?

## 2021-06-25 NOTE — Anesthesia Preprocedure Evaluation (Signed)
Anesthesia Evaluation  ?Patient identified by MRN, date of birth, ID band ?Patient awake ? ? ? ?Reviewed: ?Allergy & Precautions, H&P , NPO status , Patient's Chart, lab work & pertinent test results, reviewed documented beta blocker date and time  ? ?Airway ?Mallampati: II ? ?TM Distance: >3 FB ?Neck ROM: full ? ? ? Dental ?no notable dental hx. ? ?  ?Pulmonary ?neg pulmonary ROS, Current Smoker,  ?  ?Pulmonary exam normal ?breath sounds clear to auscultation ? ? ? ? ? ? Cardiovascular ?Exercise Tolerance: Good ?hypertension, negative cardio ROS ? ? ?Rhythm:regular Rate:Normal ? ? ?  ?Neuro/Psych ?PSYCHIATRIC DISORDERS Depression  Neuromuscular disease   ? GI/Hepatic ?Neg liver ROS, GERD  Medicated,  ?Endo/Other  ?negative endocrine ROS ? Renal/GU ?negative Renal ROS  ?negative genitourinary ?  ?Musculoskeletal ? ? Abdominal ?  ?Peds ? Hematology ?negative hematology ROS ?(+)   ?Anesthesia Other Findings ? ? Reproductive/Obstetrics ?negative OB ROS ? ?  ? ? ? ? ? ? ? ? ? ? ? ? ? ?  ?  ? ? ? ? ? ? ? ? ?Anesthesia Physical ?Anesthesia Plan ? ?ASA: 3 ? ?Anesthesia Plan: General and General ETT  ? ?Post-op Pain Management:   ? ?Induction:  ? ?PONV Risk Score and Plan: Ondansetron ? ?Airway Management Planned:  ? ?Additional Equipment:  ? ?Intra-op Plan:  ? ?Post-operative Plan:  ? ?Informed Consent: I have reviewed the patients History and Physical, chart, labs and discussed the procedure including the risks, benefits and alternatives for the proposed anesthesia with the patient or authorized representative who has indicated his/her understanding and acceptance.  ? ? ? ?Dental Advisory Given ? ?Plan Discussed with: CRNA ? ?Anesthesia Plan Comments:   ? ? ? ? ? ? ?Anesthesia Quick Evaluation ? ?

## 2021-06-25 NOTE — Care Management Important Message (Signed)
Important Message ? ?Patient Details  ?Name: Laura Mcgee ?MRN: [id number] ?Date of Birth: 1950/06/25 ? ? ?Medicare Important Message Given:  Yes ? ? ? ? ?Tommy Medal ?06/25/2021, 11:51 AM ?

## 2021-06-25 NOTE — Brief Op Note (Signed)
06/23/2021 - 06/25/2021 ? ?4:07 PM ? ?PATIENT:  Laura Mcgee  71 y.o. female ? ?PRE-OPERATIVE DIAGNOSIS:  Right humerus shaft fracture ? ?POST-OPERATIVE DIAGNOSIS:  Right humerus shaft fracture ? ?PROCEDURE:  Procedure(s): ?INTRAMEDULLARY (IM) NAIL HUMERAL (Right) ? ?SURGEON:  Surgeon(s) and Role: ?   Amedeo Kinsman, Jeannette How, MD - Primary ? ?PHYSICIAN ASSISTANT: N/A ? ?ASSISTANTS: Ammie Ferrier  ? ?ANESTHESIA:   local and general ? ?EBL:  150 mL  ? ?BLOOD ADMINISTERED:none ? ?DRAINS: none  ? ?LOCAL MEDICATIONS USED:  MARCAINE    ? ?SPECIMEN:  No Specimen ? ?DISPOSITION OF SPECIMEN:  N/A ? ?COUNTS:  YES ? ?TOURNIQUET:  * No tourniquets in log * ? ?DICTATION: .Note written in EPIC ? ?PLAN OF CARE: Admit to inpatient  ? ?PATIENT DISPOSITION:  PACU - hemodynamically stable. ?  ?Delay start of Pharmacological VTE agent (>24hrs) due to surgical blood loss or risk of bleeding: yes ? ?

## 2021-06-25 NOTE — Anesthesia Procedure Notes (Signed)
Procedure Name: Intubation ?Date/Time: 06/25/2021 1:07 PM ?Performed by: Karna Dupes, CRNA ?Pre-anesthesia Checklist: Patient identified, Emergency Drugs available, Suction available and Patient being monitored ?Patient Re-evaluated:Patient Re-evaluated prior to induction ?Oxygen Delivery Method: Circle system utilized ?Preoxygenation: Pre-oxygenation with 100% oxygen ?Induction Type: IV induction ?Ventilation: Mask ventilation without difficulty ?Laryngoscope Size: Mac and 3 ?Grade View: Grade I ?Tube type: Oral ?Tube size: 7.0 mm ?Number of attempts: 1 ?Airway Equipment and Method: Stylet ?Placement Confirmation: ETT inserted through vocal cords under direct vision, positive ETCO2 and breath sounds checked- equal and bilateral ?Secured at: 22 cm ?Tube secured with: Tape ?Dental Injury: Teeth and Oropharynx as per pre-operative assessment  ? ? ? ? ?

## 2021-06-25 NOTE — Op Note (Addendum)
Orthopaedic Surgery Operative Note (CSN: [id number])  Laura Mcgee  Aug 01, 1950 Date of Surgery: 06/25/2021   Diagnoses:  Right humerus shaft fracture  Procedure: Operative fixation of oblique fracture of the proximal one third shaft of the right humerus   Operative Finding Successful completion of the planned procedure.  Placement of a 7 mm x 25 cm humeral nail with a single cerclage tape suture.  Post-Op Diagnosis: Same Surgeons:Primary: Oliver Barre, MD Assistants: Mariane Baumgarten Location: [facility] OR ROOM 3 Anesthesia: General with local anesthesia Antibiotics: Ancef 2 g with local vancomycin powder 1 g at the surgical site Tourniquet time: N/A Estimated Blood Loss: 150 cc Complications: None Specimens: None  Implants: Implant Name Type Inv. Item Serial No. Manufacturer Lot No. LRB No. Used Action  7mm x 25cm Humeral Nail   [id number]  STERILE ON SET Right 1 Implanted  FiberTape Cerciage with TigerLink Shuttle Suture   [id number]  [id number] Right 1 Implanted  5.0 Screw 30mm   8022-030 [employer] STERILE ON SET Right 1 Implanted  5.0 Screw 45mm   [id number] [employer] STERILE ON SET Right 1 Implanted  3.5 Screw 25mm   [id number]  STERILE ON SET Right 1 Implanted    Indications for Surgery:   Laura Mcgee is a 71 y.o. female who had a syncopal episode, and sustained a right proximal humerus fracture.  Given the severity of the deformity, we discussed operative versus nonoperative management.  In order to restore her function, I recommended operative fixation.  Benefits and risks of operative and nonoperative management were discussed prior to surgery with the patient and informed consent form was completed.  Specific risks including infection, need for additional surgery, bleeding, damage to surrounding structures including blood vessels and nerves, nonunion, malunion and more severe complications associated with anesthesia.  Patient elected to proceed.  Surgical consent was  finalized.   Procedure:   The patient was identified properly. Informed consent was obtained and the surgical site was marked. The patient was taken to the OR where general anesthesia was induced.  The patient was positioned supine.  Prior to prepping the arm for surgery, we ensured that we could get appropriate views under fluoroscopy.  Next, we attempted to close reduce the fracture.  Unfortunately, we were unable to re-align the fracture.  Thus, we I made the decision to proceed with operative fixation of the right humeral shaft fracture.  The right arm was then prepped and draped in the usual sterile fashion.  Timeout was performed before the beginning of the case.  Patient received 2 g of Ancef prior to making incision.  The acromion was identified, and marked out in preparation for vision.  Off the anterior and lateral aspect of the acromion, we planned a 2-3 cm incision through skin, approximately 45 degrees from the anterior and lateral corner of the acromion.  We incised sharply through skin.  We then dissected bluntly to the fascia of the deltoid.  This was split in line with the fibers.  We then dissected bluntly to expose the rotator cuff tendons.  Once again, this was incised sharply in line with the fibers, within the surgical incision.  Using soft tissue protector, a guidepin was placed.  This was done under the guidance of fluoroscopy.  This was advanced into the canal of the humerus.  We then introduced the cannulated entry reamer.  Once again, we used a soft tissue protector.  At this point, we had excellent access to the intramedullary canal.  We  used a finger reduction tool, and then fed the ball-tipped guidewire through this reduction tool.  The finger reduction tool was then removed.  The length of the nail was then determined to be 25 cm long.  A 7 mm x 25 cm long nail was selected from the back table.  This was then introduced to the humeral canal.  Upon insertion of the nail we noted  that we were unable to adequately achieve a reduction of the long oblique fracture.  We tried multiple reduction maneuvers, and change the positioning of the nail.  1 point, we remove the nail, and tried to ream a little bit more medial to change our start point.  Unfortunately, we are unable to adequately reduce the fracture, while the nail is in place.  We then palpated at the fracture site, and noted that the anterior and lateral spike of the proximal fragment was subcutaneous.  At this point, made the determination to make an incision, to help with reduction.  We incised sharply through skin, and the anterior and lateral aspect of the upper arm.  We dissected bluntly, and immediately encountered the spike of the proximal fragment.  We then carried our dissection deep to the anterior surface of the humerus.  At this point, we noted that there was a fair amount of muscle that was interposed within the fracture fragments.  We used some retractors to maintain access to the anterior aspect humerus.  Despite being able to manipulate the fragments directly, we are unable to achieve an adequate reduction which would allow [place] to block the nail proximally and distally.  Next, we placed a cerclage tape directly over the distal extent of the fracture.  We are able to manually reduce the fractures using a fracture reduction clamp.  This was confirmed under fluoroscopy.  Once we were satisfied with her overall reduction, and the nail was appropriately seated.  We then began to tension the cerclage tape.  Since we are able to palpate the fracture, as well as visualize it, we are able to see that increasing the tension with the cerclage tape achieved a near anatomic reduction.  We then turned our attention back to the proximal extent of the humeral nail.  Using the outrigger device, we placed 2 screws, which were locked within the nail.  These did not breach the joint surface.  We then turned our attention distally, and  placed a single AP screw using perfect circle technique.  Final fluoroscopic images demonstrated excellent placement of her nail, with appropriately positioned screws proximally and distally.  The fracture remained reduced.  The outrigger device was then removed from the proximal aspect of the nail.  We irrigated the incisions copiously before placing local antibiotic as listed above.  We closed the incision in a multilayer fashion with absorbable suture and staples on the skin.  Sterile dressing was placed.  Patient was awoken taken to PACU in stable condition.   Post-operative plan:  The patient will be admitted to the floor. Nonweightbearing on the right upper extremity. Sling at all times for comfort DVT prophylaxis per primary team, no orthopedic contraindications.    Pain control with PRN pain medication preferring oral medicines.   Follow up plan will be scheduled in approximately 10-14 days for incision check and XR.

## 2021-06-25 NOTE — TOC Initial Note (Signed)
Transition of Care (TOC) - Initial/Assessment Note  ? ? ?Patient Details  ?Name: Laura Mcgee ?MRN: [id number] ?Date of Birth: [birth year] ? ?Transition of Care (TOC) CM/SW Contact:    ?Shade Flood, LCSW ?Phone Number: ?06/25/2021, 9:39 AM ? ?Clinical Narrative:                 ? ?Pt admitted from home with a R humerus fracture. Spoke with pt's daughter to review dc planning. Dtr states that pt lives alone and is very independent in ADLs at baseline. She states that pt will want to go home at dc and she thinks pt would be agreeable to [facility] if recommended. Dtr states that pt can stay with her at her house if needed though she does work during the day so she won't be home 24/7. She reports that she may look in to arranging some private caregivers to assist if needed. ? ?Pt to have surgery today. TOC will follow and assist as needed with dc planning. ? ?Expected Discharge Plan: [place] ?Barriers to Discharge: Continued Medical Work up ? ? ?Patient Goals and CMS Choice ?  ?  ?  ? ?Expected Discharge Plan and Services ?Expected Discharge Plan: [place] ?In-house Referral: Clinical Social Work ?  ?  ?  ?                ?  ?  ?  ?  ?  ?  ?  ?  ?  ?  ? ?Prior Living Arrangements/Services ?  ?Lives with:: Self ?Patient language and need for interpreter reviewed:: Yes ?Do you feel safe going back to the place where you live?: Yes      ?Need for Family Participation in Patient Care: Yes (Comment) ?Care giver support system in place?: Yes (comment) ?  ?Criminal Activity/Legal Involvement Pertinent to Current Situation/Hospitalization: No - Comment as needed ? ?Activities of Daily Living ?Home Assistive Devices/Equipment: Eyeglasses, Kasandra Knudsen (specify quad or straight) ?ADL Screening (condition at time of admission) ?Patient's cognitive ability adequate to safely complete daily activities?: Yes ?Is the patient deaf or have difficulty hearing?: No ?Does the patient have difficulty seeing, even  when wearing glasses/contacts?: No ?Does the patient have difficulty concentrating, remembering, or making decisions?: No ?Patient able to express need for assistance with ADLs?: Yes ?Does the patient have difficulty dressing or bathing?: No ?Independently performs ADLs?: Yes (appropriate for developmental age) ?Does the patient have difficulty walking or climbing stairs?: No ?Weakness of Legs: None ?Weakness of Arms/Hands: Right ? ?Permission Sought/Granted ?  ?  ?   ?   ?   ?   ? ?Emotional Assessment ?  ?  ?  ?Orientation: : Oriented to Self, Oriented to Place, Oriented to  Time, Oriented to Situation ?Alcohol / Substance Use: Alcohol Use, Illicit Drugs ?Psych Involvement: No (comment) ? ?Admission diagnosis:  Syncope [R55] ?Closed displaced spiral fracture of shaft of right humerus, initial encounter [S42.341A] ?Syncope, unspecified syncope type [R55] ?Patient Active Problem List  ? Diagnosis Date Noted  ? Closed fracture of right proximal humerus 06/25/2021  ? Alcohol abuse 06/25/2021  ? Syncope 06/24/2021  ? Fall at home, initial encounter 06/24/2021  ? Hypoalbuminemia due to protein-calorie malnutrition ([place]) 06/24/2021  ? Leukocytosis 06/24/2021  ? Essential hypertension 06/24/2021  ? Mixed hyperlipidemia 06/24/2021  ? Peripheral neuropathy 04/03/2019  ? Spinal stenosis at L4-L5 level 04/03/2019  ? RUQ pain 11/20/2018  ? Change in bowel function 11/20/2018  ? Paresthesia 10/04/2018  ?  Gait abnormality 10/04/2018  ? Incontinence of feces 10/04/2018  ? Chronic cough 04/27/2016  ? Constipation 04/27/2016  ? GERD (gastroesophageal reflux disease) 03/30/2015  ? Dysphagia 03/30/2015  ? Encounter for screening colonoscopy 03/30/2015  ? ?PCP:  Lemmie Evens, MD ?Pharmacy:   ?[facility], [place] ?[place] [address] ?Phone: [phone] Fax: [phone] ? ? ? ? ?Social Determinants of Health (SDOH) Interventions ?  ? ?Readmission Risk Interventions ?   ? View :  No data to display.  ?  ?  ?  ? ? ? ?

## 2021-06-25 NOTE — Progress Notes (Signed)
?PROGRESS NOTE ? ? ? ?Laura Mcgee  IBB:[id number] DOB: 24-Nov-1950 DOA: 06/23/2021 ?PCP: Lemmie Evens, MD  ? ?  ?Brief Narrative:  ?Laura Mcgee is a 71 y.o. female with medical history significant of hypertension, hyperlipidemia, GERD who presents to the emergency department after having a syncopal episode at home that resulted in a fall with a subsequent right humeral fracture. Patient states that when she first got up to turn down the thermostat at her home around 7 PM last night, she felt so dizzy, she felt like she was going to fall, so she held onto the wall for a few seconds after which the dizziness subsided and she walked back to a sofa in the living room.  Shortly after this, she had to get up to get something from the kitchen, as soon as she got up, she felt dizzy again, unfortunately, at this time, she felt so dizzy that she passed out.  She believed that she must have passed out for only a few minutes, on waking up, she tried to get up and realize that it was painful to move her right arm, so she called for help and she was taken to the ED for further evaluation and management.  She was found to have right proximal humerus fracture and admitted, orthopedic surgery consulted.  ? ?New events last 24 hours / Subjective: ?Patient feeling about the same today, continues to have pain in right arm. OR scheduled this afternoon.  ? ?Per SW, patient's family reported that pt has history of alcohol abuse, drinks about 4-6 beers daily, although patient downplays her drinking.   ? ?Assessment & Plan: ?  ? ?Principal Problem: ?  Syncope ?Active Problems: ?  GERD (gastroesophageal reflux disease) ?  Peripheral neuropathy ?  Fall at home, initial encounter ?  Hypoalbuminemia due to protein-calorie malnutrition ([place]) ?  Leukocytosis ?  Essential hypertension ?  Mixed hyperlipidemia ?  Closed fracture of right proximal humerus ?  Alcohol abuse ? ? ? ?Syncope due to orthostatic hypotension ?-Echo without aortic  stenosis  ?-Check orthostatic VS  ? ?Right proximal humerus fracture secondary to mechanical fall ?-Ortho consulted, OR today ?-Revised Cardiac Risk Index for Pre-Op Risk is 0 points, class 1 risk. 3.9% 30-day risk of death, MI, or cardiac arrest. Echo completed yesterday without wall motion abnormalities. Low risk for surgery.  ? ?Essential hypertension  ?-Holding antihypertensives in setting of syncope ? ?Hyperlipidemia ?-Pravachol ? ?GERD ?-PPI ? ?Alcohol abuse  ?-Monitor on CIWA ? ?Hypokalemia  ?-Replace, trend  ? ?Neuropathy ?-Cymbalta  ? ?Mood disorder  ?-Effexor  ? ? ?DVT prophylaxis:  ?SCDs Start: 06/24/21 1846 ? ?Code Status: Full ?Family Communication: None at bedside  ?Disposition Plan:  ?Status is: Inpatient ?Remains inpatient appropriate because: OR today  ? ? ?Antimicrobials:  ?Anti-infectives (From admission, onward)  ? ? Start     Dose/Rate Route Frequency Ordered Stop  ? 06/25/21 0600  ceFAZolin (ANCEF) IVPB 2g/100 mL premix       ? 2 g ?200 mL/hr over 30 Minutes Intravenous On call to O.R. 06/24/21 1302 06/26/21 0559  ? ?  ? ? ? ?Objective: ?Vitals:  ? 06/24/21 1525 06/24/21 1605 06/24/21 2135 06/25/21 0459  ?BP: 108/76 122/69 (!) 147/80 (!) 150/89  ?Pulse: 91 90 76 88  ?Resp: '18 18 20 18  '$ ?Temp:  97.7 ?F (36.5 ?C) (!) 97.5 ?F (36.4 ?C) 97.8 ?F (36.6 ?C)  ?TempSrc:  Oral Oral Oral  ?SpO2: 92% 94% 93% 93%  ?Weight:      ?  Height:      ? ? ?Intake/Output Summary (Last 24 hours) at 06/25/2021 5643 ?Last data filed at 06/25/2021 0500 ?Gross per 24 hour  ?Intake 1582.75 ml  ?Output 550 ml  ?Net 1032.75 ml  ? ?Filed Weights  ? 06/23/21 2023  ?Weight: 81.6 kg  ? ? ?Examination:  ?General exam: Appears calm  ?Respiratory system: Clear to auscultation. Respiratory effort normal. No respiratory distress. No conversational dyspnea.  ?Cardiovascular system: S1 & S2 heard, RRR. No murmurs. No pedal edema. ?Gastrointestinal system: Abdomen is nondistended, soft and nontender. Normal bowel sounds heard. ?Central  nervous system: Alert  ?Extremities: RUE in sling  ?Psychiatry: Judgement and insight appear normal. Mood & affect appropriate.  ? ?Data Reviewed: I have personally reviewed following labs and imaging studies ? ?CBC: ?Recent Labs  ?Lab 06/23/21 ?2215 06/25/21 ?0452  ?WBC 13.6* 9.7  ?HGB 15.8* 14.7  ?HCT 46.1* 45.1  ?MCV 96.2 99.3  ?PLT 222 197  ? ?Basic Metabolic Panel: ?Recent Labs  ?Lab 06/23/21 ?2215 06/24/21 ?1931 06/25/21 ?0452  ?NA 135  --  136  ?K 4.0  --  3.2*  ?CL 105  --  107  ?CO2 21*  --  21*  ?GLUCOSE 97  --  99  ?BUN 17  --  9  ?CREATININE 1.03*  --  0.51  ?CALCIUM 9.2  --  8.6*  ?MG  --   --  1.9  ?PHOS  --  2.2*  --   ? ?GFR: ?Estimated Creatinine Clearance: 70.9 mL/min (by C-G formula based on SCr of 0.51 mg/dL). ?Liver Function Tests: ?Recent Labs  ?Lab 06/24/21 ?3295 06/25/21 ?1884  ?AST 18 18  ?ALT 19 18  ?ALKPHOS 72 73  ?BILITOT 0.4 0.6  ?PROT 6.3* 6.2*  ?ALBUMIN 3.3* 3.1*  ? ?No results for input(s): LIPASE, AMYLASE in the last 168 hours. ?No results for input(s): AMMONIA in the last 168 hours. ?Coagulation Profile: ?Recent Labs  ?Lab 06/25/21 ?1660  ?INR 1.0  ? ?Cardiac Enzymes: ?No results for input(s): CKTOTAL, CKMB, CKMBINDEX, TROPONINI in the last 168 hours. ?BNP (last 3 results) ?No results for input(s): PROBNP in the last 8760 hours. ?HbA1C: ?No results for input(s): HGBA1C in the last 72 hours. ?CBG: ?No results for input(s): GLUCAP in the last 168 hours. ?Lipid Profile: ?No results for input(s): CHOL, HDL, LDLCALC, TRIG, CHOLHDL, LDLDIRECT in the last 72 hours. ?Thyroid Function Tests: ?No results for input(s): TSH, T4TOTAL, FREET4, T3FREE, THYROIDAB in the last 72 hours. ?Anemia Panel: ?No results for input(s): VITAMINB12, FOLATE, FERRITIN, TIBC, IRON, RETICCTPCT in the last 72 hours. ?Sepsis Labs: ?Recent Labs  ?Lab 06/24/21 ?6301 06/24/21 ?6010  ?LATICACIDVEN 0.8 0.7  ? ? ?No results found for this or any previous visit (from the past 240 hour(s)).  ? ? ?Radiology Studies: ?CT Head  Wo Contrast ? ?Result Date: 06/24/2021 ?CLINICAL DATA:  Syncope, fall, head trauma EXAM: CT HEAD WITHOUT CONTRAST TECHNIQUE: Contiguous axial images were obtained from the base of the skull through the vertex without intravenous contrast. RADIATION DOSE REDUCTION: This exam was performed according to the departmental dose-optimization program which includes automated exposure control, adjustment of the mA and/or kV according to patient size and/or use of iterative reconstruction technique. COMPARISON:  06/28/2018 FINDINGS: Brain: Normal anatomic configuration. Parenchymal volume loss is commensurate with the patient's age. Moderate periventricular white matter changes are present likely reflecting the sequela of small vessel ischemia, progressive since prior examination. No abnormal intra or extra-axial mass lesion or fluid collection. No abnormal mass  effect or midline shift. No evidence of acute intracranial hemorrhage or infarct. Ventricular size is normal. Cerebellum unremarkable. Vascular: No asymmetric hyperdense vasculature at the skull base. Skull: Intact Sinuses/Orbits: There is near complete opacification of the left maxillary sinus, and air-fluid level within the right maxillary sinus, and a small air-fluid level seen within the left sphenoid sinus. Remaining paranasal sinuses are clear. Orbits are unremarkable. Other: Left mastoid air cells are fluid opacified inferiorly. Middle ear cavity is clear. Right mastoid air cells and middle ear cavities are clear. IMPRESSION: No acute intracranial injury.  No calvarial fracture. Progressive moderate senescent change. Moderate bilateral paranasal sinus disease Left mastoid effusion Electronically Signed   By: Fidela Salisbury M.D.   On: 06/24/2021 00:20  ? ?US Carotid Bilateral ? ?Result Date: 06/24/2021 ?CLINICAL DATA:  71 year old female with history of syncopal episode. EXAM: BILATERAL CAROTID DUPLEX ULTRASOUND TECHNIQUE: Pearline Cables scale imaging, color Doppler and  duplex ultrasound were performed of bilateral carotid and vertebral arteries in the neck. COMPARISON:  None. FINDINGS: Criteria: Quantification of carotid stenosis is based on velocity parameters that correlate

## 2021-06-25 NOTE — Anesthesia Postprocedure Evaluation (Signed)
Anesthesia Post Note ? ?Patient: Laura Mcgee ? ?Procedure(s) Performed: INTRAMEDULLARY (IM) NAIL HUMERAL (Right: Arm Upper) ? ?Patient location during evaluation: PACU ?Anesthesia Type: General ?Level of consciousness: awake and alert ?Pain management: pain level controlled ?Vital Signs Assessment: post-procedure vital signs reviewed and stable ?Respiratory status: spontaneous breathing, nonlabored ventilation, respiratory function stable and patient connected to nasal cannula oxygen ?Cardiovascular status: blood pressure returned to baseline and stable ?Postop Assessment: no apparent nausea or vomiting ?Anesthetic complications: no ? ? ?No notable events documented. ? ? ?Last Vitals:  ?Vitals:  ? 06/25/21 1615 06/25/21 1630  ?BP: 124/86 109/62  ?Pulse: (!) 103 97  ?Resp: 17 (!) 22  ?Temp:    ?SpO2: 92% 94%  ?  ?Last Pain:  ?Vitals:  ? 06/25/21 1640  ?TempSrc:   ?PainSc: Asleep  ? ? ?  ?  ?  ?  ?  ?  ? ?Louann Sjogren ? ? ? ? ?

## 2021-06-25 NOTE — Transfer of Care (Signed)
Immediate Anesthesia Transfer of Care Note ? ?Patient: Laura Mcgee ? ?Procedure(s) Performed: INTRAMEDULLARY (IM) NAIL HUMERAL (Right: Arm Upper) ? ?Patient Location: PACU ? ?Anesthesia Type:General ? ?Level of Consciousness: awake ? ?Airway & Oxygen Therapy: Patient Spontanous Breathing ? ?Post-op Assessment: Report given to RN and Post -op Vital signs reviewed and stable ? ?Post vital signs: Reviewed and stable ? ?Last Vitals:  ?Vitals Value Taken Time  ?BP    ?Temp    ?Pulse 101 06/25/21 1608  ?Resp    ?SpO2 88 % 06/25/21 1608  ?Vitals shown include unvalidated device data. ? ?Last Pain:  ?Vitals:  ? 06/25/21 1219  ?TempSrc: Oral  ?PainSc: 9   ?   ? ?Patients Stated Pain Goal: 1 (06/25/21 1219) ? ?Complications: No notable events documented. ?

## 2021-06-26 DIAGNOSIS — I4891 Unspecified atrial fibrillation: Secondary | ICD-10-CM | POA: Clinically undetermined

## 2021-06-26 DIAGNOSIS — F1010 Alcohol abuse, uncomplicated: Secondary | ICD-10-CM

## 2021-06-26 LAB — BASIC METABOLIC PANEL
Anion gap: 6 (ref 5–15)
BUN: 17 mg/dL (ref 8–23)
CO2: 24 mmol/L (ref 22–32)
Calcium: 8.8 mg/dL — ABNORMAL LOW (ref 8.9–10.3)
Chloride: 109 mmol/L (ref 98–111)
Creatinine, Ser: 0.69 mg/dL (ref 0.44–1.00)
GFR, Estimated: >60 mL/min (ref >60)
Glucose, Bld: 131 mg/dL — ABNORMAL HIGH (ref 70–99)
Potassium: 4.3 mmol/L (ref 3.5–5.1)
Sodium: 139 mmol/L (ref 135–145)

## 2021-06-26 LAB — CBC
HCT: 43.2 % (ref 36.0–46.0)
Hemoglobin: 14.1 g/dL (ref 12.0–15.0)
MCH: 32.1 pg (ref 26.0–34.0)
MCHC: 32.6 g/dL (ref 30.0–36.0)
MCV: 98.4 fL (ref 80.0–100.0)
Platelets: 213 10*3/uL (ref 150–400)
RBC: 4.39 MIL/uL (ref 3.87–5.11)
RDW: 14.7 % (ref 11.5–15.5)
WBC: 13.2 10*3/uL — ABNORMAL HIGH (ref 4.0–10.5)
nRBC: 0 % (ref 0.0–0.2)

## 2021-06-26 LAB — TSH: TSH: 0.169 u[IU]/mL — ABNORMAL LOW (ref 0.350–4.500)

## 2021-06-26 LAB — MAGNESIUM: Magnesium: 1.9 mg/dL (ref 1.7–2.4)

## 2021-06-26 MED ORDER — DILTIAZEM HCL 25 MG/5ML IV SOLN
10.0000 mg | Freq: Once | INTRAVENOUS | Status: AC
Start: 2021-06-26 — End: 2021-06-26
  Administered 2021-06-26: 10 mg via INTRAVENOUS

## 2021-06-26 MED ORDER — DILTIAZEM HCL 25 MG/5ML IV SOLN: 10.0000 mg | Freq: Once | INTRAVENOUS | Status: DC

## 2021-06-26 MED ORDER — IRBESARTAN 150 MG PO TABS
150.0000 mg | ORAL_TABLET | Freq: Every day | ORAL | Status: DC
  Administered 2021-06-26 – 2021-06-28 (×3): 150 mg via ORAL
  Filled 2021-06-26 (×3): qty 1

## 2021-06-26 MED ORDER — CHLORHEXIDINE GLUCONATE CLOTH 2 % EX PADS
6.0000 | MEDICATED_PAD | Freq: Every day | CUTANEOUS | Status: DC
  Administered 2021-06-27: 6 via TOPICAL

## 2021-06-26 MED ORDER — METOPROLOL TARTRATE 5 MG/5ML IV SOLN
5.0000 mg | INTRAVENOUS | Status: DC | PRN
Start: 2021-06-26 — End: 2021-06-26

## 2021-06-26 MED ORDER — AMLODIPINE BESYLATE 5 MG PO TABS
10.0000 mg | ORAL_TABLET | Freq: Every day | ORAL | Status: DC
  Administered 2021-06-26: 10 mg via ORAL
  Filled 2021-06-26: qty 2

## 2021-06-26 MED ORDER — AMIODARONE HCL IN DEXTROSE 360-4.14 MG/200ML-% IV SOLN
30.0000 mg/h | INTRAVENOUS | Status: DC
  Administered 2021-06-28: 30 mg/h via INTRAVENOUS
  Filled 2021-06-26: qty 200

## 2021-06-26 MED ORDER — DILTIAZEM HCL-DEXTROSE 125-5 MG/125ML-% IV SOLN (PREMIX)
5.0000 mg/h | INTRAVENOUS | Status: DC
  Administered 2021-06-26 – 2021-06-28 (×2): 5 mg/h via INTRAVENOUS
  Filled 2021-06-26 (×2): qty 125

## 2021-06-26 MED ORDER — AMIODARONE LOAD VIA INFUSION
150.0000 mg | Freq: Once | INTRAVENOUS | Status: AC
  Administered 2021-06-26: 150 mg via INTRAVENOUS
  Filled 2021-06-26: qty 83.34

## 2021-06-26 MED ORDER — DILTIAZEM HCL 25 MG/5ML IV SOLN
INTRAVENOUS | Status: AC
  Filled 2021-06-26: qty 5

## 2021-06-26 MED ORDER — AMIODARONE LOAD VIA INFUSION
150.0000 mg | Freq: Once | INTRAVENOUS | Status: DC
Start: 2021-06-26 — End: 2021-06-26

## 2021-06-26 MED ORDER — AMIODARONE HCL IN DEXTROSE 360-4.14 MG/200ML-% IV SOLN
60.0000 mg/h | INTRAVENOUS | Status: AC
Start: 2021-06-26 — End: 2021-06-27
  Administered 2021-06-26 (×2): 60 mg/h via INTRAVENOUS
  Filled 2021-06-26: qty 200

## 2021-06-26 NOTE — Progress Notes (Signed)
Pt resting in bed asymptomatic no complaints afebrile HR jumping around highest 170s. RN at bedside. MD informed. See MAR for med administration. See flowsheet for vitals. EKG obtained. Transferred to ICU room 12.  ?

## 2021-06-26 NOTE — Progress Notes (Addendum)
?  PROGRESS NOTE ? ?Called by RN regarding her elevated HR. She was in NSR in the morning, got tachycardic in the afternoon after getting up for orthostatic VS. Dr. Denton Brick checked in on patient, EKG revealed A Fib RVR. Patient was given IV cardizem push then started on drip. Transferred to stepdown unit.  ? ?Called again by stepdown RN, patient maxed on cardizem drip dose and remains tachycardic. Ordered IV amiodarone loading dose then infusion. K and Mg within normal. TSH is low, added free T4.  ? ?I called patient's daughter with update.   ? ? ?Dessa Phi, DO ?[facility] ?06/26/2021, 7:00 PM ? ?Available via Epic secure chat 7am-7pm ?After these hours, please refer to coverage provider listed on [email or web address]  ? ?

## 2021-06-26 NOTE — Progress Notes (Signed)
Arrived to ICU 12 at this time ?

## 2021-06-26 NOTE — Progress Notes (Signed)
?PROGRESS NOTE ? ? ? ?Laura Mcgee  OVF:[id number] DOB: [birth year] DOA: 06/23/2021 ?PCP: Lemmie Evens, MD  ? ?  ?Brief Narrative:  ?Laura Mcgee is a 71 y.o. female with medical history significant of hypertension, hyperlipidemia, GERD who presents to the emergency department after having a syncopal episode at home that resulted in a fall with a subsequent right humeral fracture. Patient states that when she first got up to turn down the thermostat at her home around 7 PM last night, she felt so dizzy, she felt like she was going to fall, so she held onto the wall for a few seconds after which the dizziness subsided and she walked back to a sofa in the living room.  Shortly after this, she had to get up to get something from the kitchen, as soon as she got up, she felt dizzy again, unfortunately, at this time, she felt so dizzy that she passed out.  She believed that she must have passed out for only a few minutes, on waking up, she tried to get up and realize that it was painful to move her right arm, so she called for help and she was taken to the ED for further evaluation and management.  She was found to have right proximal humerus fracture and admitted, orthopedic surgery consulted. Patient underwent intramedullary nail fixation by Dr. Amedeo Kinsman 4/21. Per SW, patient's family reported that pt has history of alcohol abuse, drinks about 4-6 beers daily, although patient downplays her drinking. She was put on CIWA protocol.  ? ?New events last 24 hours / Subjective: ?Patient continues to have some pain in her right upper extremity.  Complains of sinusitis/sinus infection that she has been dealing with for months.  She has taken amoxicillin as outpatient in the past.  Has not seen ENT. ? ?Assessment & Plan: ?  ? ?Principal Problem: ?  Syncope ?Active Problems: ?  GERD (gastroesophageal reflux disease) ?  Peripheral neuropathy ?  Fall at home, initial encounter ?  Hypoalbuminemia due to protein-calorie  malnutrition ([place]) ?  Leukocytosis ?  Essential hypertension ?  Mixed hyperlipidemia ?  Closed fracture of right proximal humerus ?  Alcohol abuse ? ? ? ?Syncope due to orthostatic hypotension ?-Echo without aortic stenosis  ?-Check orthostatic VS  ? ?Right proximal humerus fracture secondary to mechanical fall ?-Ortho consulted, status post intramedullary nail fixation by Dr. Amedeo Kinsman 4/21 ?-PT OT Pending, non weightbearing to right upper extremity ? ?Essential hypertension  ?-Resume Norvasc, ARB ? ?Hyperlipidemia ?-Pravachol ? ?GERD ?-PPI ? ?Alcohol abuse  ?-Monitor on CIWA ? ?Neuropathy ?-Cymbalta  ? ?Mood disorder  ?-Effexor  ? ? ?DVT prophylaxis:  ?SCDs Start: 06/24/21 1846 ? ?Code Status: Full ?Family Communication: Family members at bedside ?Disposition Plan:  ?Status is: Inpatient ?Remains inpatient appropriate because: Pending PT OT ? ? ?Antimicrobials:  ?Anti-infectives (From admission, onward)  ? ? Start     Dose/Rate Route Frequency Ordered Stop  ? 06/25/21 2000  ceFAZolin (ANCEF) IVPB 2g/100 mL premix       ? 2 g ?200 mL/hr over 30 Minutes Intravenous Every 8 hours 06/25/21 1916 06/26/21 2159  ? 06/25/21 1540  vancomycin (VANCOCIN) powder  Status:  Discontinued       ?   As needed 06/25/21 1611 06/25/21 1650  ? 06/25/21 1221  ceFAZolin (ANCEF) 2-4 GM/100ML-% IVPB       ?Note to Pharmacy: Jerrye Beavers: cabinet override  ?    06/25/21 1221 06/25/21 1331  ? 06/25/21 0600  ceFAZolin (  ANCEF) IVPB 2g/100 mL premix       ? 2 g ?200 mL/hr over 30 Minutes Intravenous On call to O.R. 06/24/21 1302 06/25/21 1316  ? ?  ? ? ? ?Objective: ?Vitals:  ? 06/25/21 1917 06/25/21 2100 06/26/21 0453 06/26/21 0529  ?BP: 116/80 130/81 (!) 143/84   ?Pulse: 98 94 91   ?Resp:  20 18   ?Temp:  98.2 ?F (36.8 ?C) 98 ?F (36.7 ?C)   ?TempSrc:  Oral Oral   ?SpO2:  92% 93%   ?Weight:    89.1 kg  ?Height:      ? ? ?Intake/Output Summary (Last 24 hours) at 06/26/2021 1212 ?Last data filed at 06/26/2021 0500 ?Gross per 24 hour  ?Intake  1162.62 ml  ?Output 150 ml  ?Net 1012.62 ml  ? ? ?Filed Weights  ? 06/23/21 2023 06/25/21 1219 06/26/21 0529  ?Weight: 81.6 kg 81 kg 89.1 kg  ? ? ?Examination:  ?General exam: Appears calm  ?Respiratory system: Clear to auscultation. Respiratory effort normal. No respiratory distress. No conversational dyspnea.  ?Cardiovascular system: S1 & S2 heard, RRR. No murmurs. No pedal edema. ?Gastrointestinal system: Abdomen is nondistended, soft and nontender. Normal bowel sounds heard. ?Central nervous system: Alert  ?Extremities: RUE not in sling  ?Psychiatry: Judgement and insight appear normal. Mood & affect appropriate.  ? ?Data Reviewed: I have personally reviewed following labs and imaging studies ? ?CBC: ?Recent Labs  ?Lab 06/23/21 ?2215 06/25/21 ?0452 06/26/21 ?0527  ?WBC 13.6* 9.7 13.2*  ?HGB 15.8* 14.7 14.1  ?HCT 46.1* 45.1 43.2  ?MCV 96.2 99.3 98.4  ?PLT 222 197 213  ? ? ?Basic Metabolic Panel: ?Recent Labs  ?Lab 06/23/21 ?2215 06/24/21 ?1931 06/25/21 ?0452 06/26/21 ?0527  ?NA 135  --  136 139  ?K 4.0  --  3.2* 4.3  ?CL 105  --  107 109  ?CO2 21*  --  21* 24  ?GLUCOSE 97  --  99 131*  ?BUN 17  --  9 17  ?CREATININE 1.03*  --  0.51 0.69  ?CALCIUM 9.2  --  8.6* 8.8*  ?MG  --   --  1.9 1.9  ?PHOS  --  2.2*  --   --   ? ? ?GFR: ?Estimated Creatinine Clearance: 73.9 mL/min (by C-G formula based on SCr of 0.69 mg/dL). ?Liver Function Tests: ?Recent Labs  ?Lab 06/24/21 ?7564 06/25/21 ?3329  ?AST 18 18  ?ALT 19 18  ?ALKPHOS 72 73  ?BILITOT 0.4 0.6  ?PROT 6.3* 6.2*  ?ALBUMIN 3.3* 3.1*  ? ? ?No results for input(s): LIPASE, AMYLASE in the last 168 hours. ?No results for input(s): AMMONIA in the last 168 hours. ?Coagulation Profile: ?Recent Labs  ?Lab 06/25/21 ?5188  ?INR 1.0  ? ? ?Cardiac Enzymes: ?No results for input(s): CKTOTAL, CKMB, CKMBINDEX, TROPONINI in the last 168 hours. ?BNP (last 3 results) ?No results for input(s): PROBNP in the last 8760 hours. ?HbA1C: ?No results for input(s): HGBA1C in the last 72  hours. ?CBG: ?No results for input(s): GLUCAP in the last 168 hours. ?Lipid Profile: ?No results for input(s): CHOL, HDL, LDLCALC, TRIG, CHOLHDL, LDLDIRECT in the last 72 hours. ?Thyroid Function Tests: ?No results for input(s): TSH, T4TOTAL, FREET4, T3FREE, THYROIDAB in the last 72 hours. ?Anemia Panel: ?No results for input(s): VITAMINB12, FOLATE, FERRITIN, TIBC, IRON, RETICCTPCT in the last 72 hours. ?Sepsis Labs: ?Recent Labs  ?Lab 06/24/21 ?4166 06/24/21 ?0630  ?LATICACIDVEN 0.8 0.7  ? ? ? ?No results found for this or  any previous visit (from the past 240 hour(s)).  ? ? ?Radiology Studies: ?DG Humerus Right ? ?Result Date: 06/25/2021 ?CLINICAL DATA:  Postop right humerus. EXAM: RIGHT HUMERUS - 2+ VIEW COMPARISON:  None. FINDINGS: A radiopaque intramedullary rod is seen throughout the length of the right humeral shaft. Proximal and distal radiopaque fixation screws are seen. Acute fracture of the proximal right humeral shaft is seen with anatomic alignment. There is no evidence of dislocation. Diffuse soft tissue swelling is noted with multiple skin staples. IMPRESSION: Status post ORIF of the proximal right humerus. Electronically Signed   By: Virgina Norfolk M.D.   On: 06/25/2021 16:28  ? ?DG Humerus Right ? ?Result Date: 06/25/2021 ?CLINICAL DATA:  ORIF EXAM: RIGHT HUMERUS - 2+ VIEW COMPARISON:  06/23/2021 FINDINGS: Eight fluoroscopic images are obtained during the performance of the procedure and are provided for interpretation only. Images demonstrate intramedullary nail fixation of a proximal humeral fracture. Fracture components are in near anatomic alignment. No additional acute fracture is seen. Fluoroscopy time: 2 minutes 10 seconds 11.15 mGy IMPRESSION: Expected intraoperative appearance of intramedullary nail fixation of a proximal humeral fracture. Electronically Signed   By: Merilyn Baba M.D.   On: 06/25/2021 16:11  ? ?DG C-Arm 1-60 Min-No Report ? ?Result Date: 06/25/2021 ?Fluoroscopy was  utilized by the requesting physician.  No radiographic interpretation.  ? ?DG C-Arm 1-60 Min-No Report ? ?Result Date: 06/25/2021 ?Fluoroscopy was utilized by the requesting physician.  No radiographic interpretation.  ? ?ECHOCAR

## 2021-06-26 NOTE — Progress Notes (Signed)
Heart rate remaining > 140s - on Cardizem '15mg'$  gtt at this time, MD notified  ?

## 2021-06-26 NOTE — Progress Notes (Signed)
?   06/26/21 1549  ?Assess: MEWS Score  ?BP (!) 118/99  ?Pulse Rate (!) 142  ?Level of Consciousness Alert  ?SpO2 92 %  ?O2 Device Room Air  ?Assess: MEWS Score  ?MEWS Temp 0  ?MEWS Systolic 0  ?MEWS Pulse 3  ?MEWS RR 0  ?MEWS LOC 0  ?MEWS Score 3  ?MEWS Score Color Yellow  ?Assess: if the MEWS score is Yellow or Red  ?Were vital signs taken at a resting state? Yes  ?Focused Assessment Change from prior assessment (see assessment flowsheet)  ?Early Detection of Sepsis Score *See Row Information* Medium  ?Take Vital Signs  ?Increase Vital Sign Frequency  Red: Q 1hr X 4 then Q 4hr X 4, if remains red, continue Q 4hrs  ?Escalate  ?MEWS: Escalate Red: discuss with charge nurse/RN and provider, consider discussing with RRT  ?Notify: Charge Nurse/RN  ?Name of Charge Nurse/RN Notified Izora Gala RN  ?Date Charge Nurse/RN Notified 06/26/21  ?Time Charge Nurse/RN Notified [phone]  ?Notify: Provider  ?Date Provider Notified 06/26/21  ?Time Provider Notified 1549  ?Provider response See new orders  ?Date of Provider Response 06/26/21  ?Time of Provider Response 1549  ? ? ?

## 2021-06-26 NOTE — Progress Notes (Signed)
Left message to inform daughter of transfer to ICU. No answer.  ?

## 2021-06-26 NOTE — Progress Notes (Signed)
ORTHOPAEDIC PROGRESS NOTE ? ?S/p ?INTRAMEDULLARY (IM) NAIL HUMERAL - R humerus ? ? ?DOS: 06/25/2021 ? ?SUBJECTIVE: ?She states she slept very well overnight.  She continues to have pain in the right shoulder.  No acute issues otherwise.  No numbness or tingling.  She has not worked with therapy yet. ? ?OBJECTIVE: ?PE: ? ?Alert and oriented.  No acute distress. ? ?Right arm wrapped with an Ace wrap.  No obvious drainage.  Proximal dressing has been changed. ?Dressings otherwise are clean, dry and intact.  She has active motion intact of the right hand.  She is able to fully extend her wrist and her fingers.  2+ radial pulse.  She is not wearing the sling while she is in bed currently. ? ?Vitals:  ? 06/26/21 1311 06/26/21 1312  ?BP: 126/71 126/71  ?Pulse:  (!) 109  ?Resp:  20  ?Temp:  98.2 ?F (36.8 ?C)  ?SpO2:  93%  ? ? ?  Latest Ref Rng & Units 06/26/2021  ?  5:27 AM 06/25/2021  ?  4:52 AM 06/23/2021  ? 10:15 PM  ?CBC  ?WBC 4.0 - 10.5 K/uL 13.2   9.7   13.6    ?Hemoglobin 12.0 - 15.0 g/dL 14.1   14.7   15.8    ?Hematocrit 36.0 - 46.0 % 43.2   45.1   46.1    ?Platelets 150 - 400 K/uL 213   197   222    ?  ? ?ASSESSMENT: ?Laura Mcgee is a 71 y.o. female stable, doing well postoperatively.  POD #1. ? ?PLAN: ?Weightbearing: NWB RUE ?Insicional and dressing care: Reinforce dressings as needed ?Orthopedic device(s): sling to right upper arm when out of bed.  Okay to remove when she remains in bed. ?VTE prophylaxis: None currently, please hold until POD#1 ?Pain control: PRN medications, judicious use of narcotics ?Follow - up plan: 2 weeks postop ? ?Based on my discussions with family, as well as the extent of her injury, I do think she would benefit from rehabilitation following discharge from ? ? ?Contact information:   ? ? ?Jaya Lapka A. Amedeo Kinsman, MD MS ?[facility] ?[address] ?[place],  [place]  [address] ?Phone: [phone] ?Fax: [phone] ? ?

## 2021-06-26 NOTE — Progress Notes (Signed)
Pt converted to NSR, HR 84-85 at 22:30pm. Cardizem drip stopped. Dr. Josephine Cables notified. Instructed by provider to stop the amio drip and to restart if pt converts back to A-fib. Pt currently asleep with stable vitals: HR 84 NSR, BP 114/68 (83), O2 91%, RR 21.  ?

## 2021-06-26 NOTE — Progress Notes (Signed)
Daughter informed of transfer, MD informed.  ?

## 2021-06-26 NOTE — Progress Notes (Signed)
?- ?  Brief summary ?71 y.o. female with medical history significant of hypertension, hyperlipidemia, GERD admitted on 06/24/2021 after a fall/syncope at home resulting in right proximal humerus shaft fracture, she underwent IM nail of right humerus fracture on 06/25/2021, this admission was also noted to be having DTs and has been getting lorazepam per CIWA protocol ?- ?Subjective:- ?I was called to evaluate patient today due to persistent tachycardia with heart rate up to the 170s ?- ?-Patient denies any palpitations, dizziness and dyspnea ?Serial EKG shows A-fib with RVR,  ? ?Objective ?Temp:  [98 ?F (36.7 ?C)-98.5 ?F (36.9 ?C)] 98.2 ?F (36.8 ?C) (04/22 1312) ?Pulse Rate:  [90-176] 176 (04/22 1556) ?Resp:  [17-22] 20 (04/22 1312) ?BP: (100-143)/(62-99) 100/74 (04/22 1556) ?SpO2:  [92 %-94 %] 92 % (04/22 1549) ?Weight:  [89.1 kg] 89.1 kg (04/22 0529) ?-Gen-Awake, alert x 3,  coherent ?-Lungs--clear bilaterally no wheezing ?CV--irregularly irregular and tachycardic heart rate in the 150s ?MSK-right shoulder postop dressing ?Neuropsych--cooperative, slightly anxious affect ?Legs-good pulses no significant edema ? ?A/p ?1) new onset A-fib with RVR--- ?-TSH requested ?-Magnesium is 1.9, potassium is 4.3, sodium 139 ?-Serial EKGs reviewed shows A-fib with RVR ?-Echo from 06/24/2021 with EF of 60 to 65% without regional wall motion normalities, patient did have grade 1 diastolic dysfunction ?-No aortic stenosis was noted ?-Given persistent tachycardia patient received IV Cardizem 10 mg x 1 ?-We will transfer patient to stepdown unit and start IV Cardizem drip for rate control ?- ?-Discussed case with patient's attending physician Dr. Gala Lewandowsky, as well as patient's bedside nurse and charge nurse ?- ?We will continue to monitor ?- ?Total critical care time 43 minutes ? ?Roxan Hockey, MD ? ?CRITICAL CARE ?Performed by: Roxan Hockey ? ? ?Total critical care time: 43 minutes ? ?Critical care time was exclusive of separately  billable procedures and treating other patients. ?- ?Symptomatic tachycardia A-fib with RVR requiring interventions ? ?Critical care was necessary to treat or prevent imminent or life-threatening deterioration. ? ?Critical care was time spent personally by me on the following activities: development of treatment plan with patient and/or surrogate as well as nursing, discussions with consultants, evaluation of patient's response to treatment, examination of patient, obtaining history from patient or surrogate, ordering and performing treatments and interventions, ordering and review of laboratory studies, ordering and review of radiographic studies, pulse oximetry and re-evaluation of patient's condition. ? ? ? ?

## 2021-06-27 LAB — CBC
HCT: 39.9 % (ref 36.0–46.0)
Hemoglobin: 13.3 g/dL (ref 12.0–15.0)
MCH: 32.5 pg (ref 26.0–34.0)
MCHC: 33.3 g/dL (ref 30.0–36.0)
MCV: 97.6 fL (ref 80.0–100.0)
Platelets: 204 10*3/uL (ref 150–400)
RBC: 4.09 MIL/uL (ref 3.87–5.11)
RDW: 14.9 % (ref 11.5–15.5)
WBC: 11.5 10*3/uL — ABNORMAL HIGH (ref 4.0–10.5)
nRBC: 0 % (ref 0.0–0.2)

## 2021-06-27 LAB — RENAL FUNCTION PANEL
Albumin: 2.8 g/dL — ABNORMAL LOW (ref 3.5–5.0)
Anion gap: 5 (ref 5–15)
BUN: 14 mg/dL (ref 8–23)
CO2: 26 mmol/L (ref 22–32)
Calcium: 8.7 mg/dL — ABNORMAL LOW (ref 8.9–10.3)
Chloride: 108 mmol/L (ref 98–111)
Creatinine, Ser: 0.55 mg/dL (ref 0.44–1.00)
GFR, Estimated: >60 mL/min (ref >60)
Glucose, Bld: 110 mg/dL — ABNORMAL HIGH (ref 70–99)
Phosphorus: 2.2 mg/dL — ABNORMAL LOW (ref 2.5–4.6)
Potassium: 3.6 mmol/L (ref 3.5–5.1)
Sodium: 139 mmol/L (ref 135–145)

## 2021-06-27 LAB — MAGNESIUM: Magnesium: 1.9 mg/dL (ref 1.7–2.4)

## 2021-06-27 LAB — MRSA NEXT GEN BY PCR, NASAL: MRSA by PCR Next Gen: NOT DETECTED

## 2021-06-27 LAB — T4, FREE: Free T4: 1.31 ng/dL — ABNORMAL HIGH (ref 0.61–1.12)

## 2021-06-27 MED ORDER — MELATONIN 3 MG PO TABS
6.0000 mg | ORAL_TABLET | Freq: Once | ORAL | Status: AC
  Administered 2021-06-27: 6 mg via ORAL
  Filled 2021-06-27: qty 2

## 2021-06-27 NOTE — Progress Notes (Signed)
?PROGRESS NOTE ? ? ? ?Laura Mcgee  VPX:[id number] DOB: [birth year] DOA: 06/23/2021 ?PCP: Lemmie Evens, MD ? ? ?Brief Narrative:  ?Laura Mcgee is a 71 y.o. female with medical history significant of hypertension, hyperlipidemia, GERD who presents to the emergency department after having a syncopal episode at home that resulted in a fall with a subsequent right humeral fracture. Patient states after multiple attempts to ambulate with profound dizziness and ultimately episode of syncope for "only a few minutes" and woke up with painful right upper extremity. She was found to have right proximal humerus fracture and admitted, orthopedic surgery consulted. Patient underwent intramedullary nail fixation by Dr. Amedeo Kinsman 4/21. Per SW, patient's family reported that pt has history of alcohol abuse, drinks about 4-6 beers daily, although patient downplays her drinking. She was put on CIWA protocol.  ? ?Assessment & Plan: ?  ?Principal Problem: ?  Syncope ?Active Problems: ?  Atrial fibrillation with RVR ([place]) ?  GERD (gastroesophageal reflux disease) ?  Peripheral neuropathy ?  Fall at home, initial encounter ?  Hypoalbuminemia due to protein-calorie malnutrition ([place]) ?  Leukocytosis ?  Essential hypertension ?  Mixed hyperlipidemia ?  Closed fracture of right proximal humerus ?  Alcohol abuse ? ?Syncope due to orthostatic hypotension ?Complicated by alcohol abuse ?-Echo without aortic stenosis  ?-PT OT evaluation pending, orthostatic vital signs pending ?  ?Right proximal humerus fracture secondary to mechanical fall ?-Ortho following, status post intramedullary nail fixation by Dr. Amedeo Kinsman 4/21 ?-PT OT Pending, non weightbearing to right upper extremity in the interim ?  ?Essential hypertension  ?-Resume Norvasc, ARB ?  ?Hyperlipidemia ?-Pravachol ?  ?GERD ?-PPI ?  ?Alcohol abuse  ?-Monitor on CIWA ?  ?Neuropathy ?-Cymbalta  ?  ?Mood disorder  ?-Effexor  ?  ?  ?DVT prophylaxis:  ?SCDs Start: 06/24/21 1846 ?  ?Code  Status: Full ?Family Communication: Daughter at bedside ?Disposition Plan:  ? ?Status is: Inpatient ? ?Dispo: The patient is from: Home ?             Anticipated d/c is to: To be determined ?             Anticipated d/c date is: 24 to 48 hours ?             Patient currently not medically stable for discharge ? ?Consultants:  ?Orthopedic surgery ? ?Procedures:  ?IM nail right humerus ? ?Antimicrobials:  ?None none ? ?Subjective: ?Acute issues or events overnight ? ?Objective: ?Vitals:  ? 06/27/21 0300 06/27/21 0400 06/27/21 0500 06/27/21 0600  ?BP: 129/64 138/72 127/70 129/79  ?Pulse: 84 83 78 82  ?Resp: '18 19 19 18  '$ ?Temp:  98 ?F (36.7 ?C)    ?TempSrc:  Oral    ?SpO2: 93% 90% 93% 93%  ?Weight:   85.8 kg   ?Height:      ? ? ?Intake/Output Summary (Last 24 hours) at 06/27/2021 0711 ?Last data filed at 06/26/2021 1617 ?Gross per 24 hour  ?Intake 200.01 ml  ?Output 500 ml  ?Net -299.99 ml  ? ?Filed Weights  ? 06/26/21 0529 06/26/21 1619 06/27/21 0500  ?Weight: 89.1 kg 86.1 kg 85.8 kg  ? ? ?Examination: ? ?General:  Pleasantly resting in bed, No acute distress. ?HEENT:  Normocephalic atraumatic.  Sclerae nonicteric, noninjected.  Extraocular movements intact bilaterally. ?Neck:  Without mass or deformity.  Trachea is midline. ?Lungs:  Clear to auscultate bilaterally without rhonchi, wheeze, or rales. ?Heart:  Regular rate and rhythm.  Without murmurs, rubs, or gallops. ?  Abdomen:  Soft, nontender, nondistended.  Without guarding or rebound. ?Extremities: Right extremity sling and bandage clean dry intact ?Vascular:  Dorsalis pedis and posterior tibial pulses palpable bilaterally. ?Skin:  Warm and dry, no erythema, no ulcerations. ? ? ? ?Data Reviewed: I have personally reviewed following labs and imaging studies ? ?CBC: ?Recent Labs  ?Lab 06/23/21 ?2215 06/25/21 ?0452 06/26/21 ?7026 06/27/21 ?3785  ?WBC 13.6* 9.7 13.2* 11.5*  ?HGB 15.8* 14.7 14.1 13.3  ?HCT 46.1* 45.1 43.2 39.9  ?MCV 96.2 99.3 98.4 97.6  ?PLT 222 197 213  204  ? ?Basic Metabolic Panel: ?Recent Labs  ?Lab 06/23/21 ?2215 06/24/21 ?1931 06/25/21 ?0452 06/26/21 ?0527  ?NA 135  --  136 139  ?K 4.0  --  3.2* 4.3  ?CL 105  --  107 109  ?CO2 21*  --  21* 24  ?GLUCOSE 97  --  99 131*  ?BUN 17  --  9 17  ?CREATININE 1.03*  --  0.51 0.69  ?CALCIUM 9.2  --  8.6* 8.8*  ?MG  --   --  1.9 1.9  ?PHOS  --  2.2*  --   --   ? ?GFR: ?Estimated Creatinine Clearance: 71.2 mL/min (by C-G formula based on SCr of 0.69 mg/dL). ?Liver Function Tests: ?Recent Labs  ?Lab 06/24/21 ?8850 06/25/21 ?2774  ?AST 18 18  ?ALT 19 18  ?ALKPHOS 72 73  ?BILITOT 0.4 0.6  ?PROT 6.3* 6.2*  ?ALBUMIN 3.3* 3.1*  ? ?No results for input(s): LIPASE, AMYLASE in the last 168 hours. ?No results for input(s): AMMONIA in the last 168 hours. ?Coagulation Profile: ?Recent Labs  ?Lab 06/25/21 ?1287  ?INR 1.0  ? ?Cardiac Enzymes: ?No results for input(s): CKTOTAL, CKMB, CKMBINDEX, TROPONINI in the last 168 hours. ?BNP (last 3 results) ?No results for input(s): PROBNP in the last 8760 hours. ?HbA1C: ?No results for input(s): HGBA1C in the last 72 hours. ?CBG: ?No results for input(s): GLUCAP in the last 168 hours. ?Lipid Profile: ?No results for input(s): CHOL, HDL, LDLCALC, TRIG, CHOLHDL, LDLDIRECT in the last 72 hours. ?Thyroid Function Tests: ?Recent Labs  ?  06/26/21 ?0527  ?TSH 0.169*  ? ?Anemia Panel: ?No results for input(s): VITAMINB12, FOLATE, FERRITIN, TIBC, IRON, RETICCTPCT in the last 72 hours. ?Sepsis Labs: ?Recent Labs  ?Lab 06/24/21 ?8676 06/24/21 ?7209  ?LATICACIDVEN 0.8 0.7  ? ? ?Recent Results (from the past 240 hour(s))  ?MRSA Next Gen by PCR, Nasal     Status: None  ? Collection Time: 06/26/21  6:33 PM  ? Specimen: Nasal Mucosa; Nasal Swab  ?Result Value Ref Range Status  ? MRSA by PCR Next Gen NOT DETECTED NOT DETECTED Final  ?  Comment: (NOTE) ?The GeneXpert MRSA Assay (FDA approved for NASAL specimens only), ?is one component of a comprehensive MRSA colonization surveillance ?program. It is not  intended to diagnose MRSA infection nor to guide ?or monitor treatment for MRSA infections. ?Test performance is not FDA approved in patients less than 2 years ?old. ?Performed at [facility], [address], [place], [place] [address] ?  ?  ? ? ? ? ? ?Radiology Studies: ?DG Humerus Right ? ?Result Date: 06/25/2021 ?CLINICAL DATA:  Postop right humerus. EXAM: RIGHT HUMERUS - 2+ VIEW COMPARISON:  None. FINDINGS: A radiopaque intramedullary rod is seen throughout the length of the right humeral shaft. Proximal and distal radiopaque fixation screws are seen. Acute fracture of the proximal right humeral shaft is seen with anatomic alignment. There is no evidence of dislocation.  Diffuse soft tissue swelling is noted with multiple skin staples. IMPRESSION: Status post ORIF of the proximal right humerus. Electronically Signed   By: Virgina Norfolk M.D.   On: 06/25/2021 16:28  ? ?DG Humerus Right ? ?Result Date: 06/25/2021 ?CLINICAL DATA:  ORIF EXAM: RIGHT HUMERUS - 2+ VIEW COMPARISON:  06/23/2021 FINDINGS: Eight fluoroscopic images are obtained during the performance of the procedure and are provided for interpretation only. Images demonstrate intramedullary nail fixation of a proximal humeral fracture. Fracture components are in near anatomic alignment. No additional acute fracture is seen. Fluoroscopy time: 2 minutes 10 seconds 11.15 mGy IMPRESSION: Expected intraoperative appearance of intramedullary nail fixation of a proximal humeral fracture. Electronically Signed   By: Merilyn Baba M.D.   On: 06/25/2021 16:11  ? ?DG C-Arm 1-60 Min-No Report ? ?Result Date: 06/25/2021 ?Fluoroscopy was utilized by the requesting physician.  No radiographic interpretation.  ? ?DG C-Arm 1-60 Min-No Report ? ?Result Date: 06/25/2021 ?Fluoroscopy was utilized by the requesting physician.  No radiographic interpretation.   ? ? ? ? ? ?Scheduled Meds: ? Chlorhexidine Gluconate Cloth  6 each Topical Daily  ? diltiazem  10 mg Intravenous Once   ? DULoxetine  60 mg Oral Daily  ? feeding supplement  237 mL Oral BID BM  ? folic acid  1 mg Oral Daily  ? irbesartan  150 mg Oral Daily  ? multivitamin with minerals  1 tablet Oral Daily  ? pantoprazole  40 mg Or

## 2021-06-27 NOTE — Progress Notes (Signed)
ORTHOPAEDIC PROGRESS NOTE ? ?S/p ?INTRAMEDULLARY (IM) NAIL HUMERAL - R humerus ? ? ?DOS: 06/25/2021 ? ?SUBJECTIVE: ?Transferred to stepdown yesterday due to A-fib with RVR.  No issues over night.  Continues to have pain in the right arm.  Medications are helping.  No numbness or tingling.  ? ?OBJECTIVE: ?PE: ? ?Alert and oriented.  No acute distress. ? ?Right arm wrapped with an Ace wrap.  No obvious drainage.  Proximal dressing has been changed. ?Dressings otherwise are clean, dry and intact.  She has active motion intact of the right hand.  She is able to fully extend her wrist and her fingers.  2+ radial pulse.   ? ? ?Vitals:  ? 06/27/21 1709 06/27/21 2000  ?BP:  125/74  ?Pulse: (!) 114   ?Resp: 19   ?Temp: 98.4 ?F (36.9 ?C)   ?SpO2: 92%   ? ? ?  Latest Ref Rng & Units 06/27/2021  ?  6:27 AM 06/26/2021  ?  5:27 AM 06/25/2021  ?  4:52 AM  ?CBC  ?WBC 4.0 - 10.5 K/uL 11.5   13.2   9.7    ?Hemoglobin 12.0 - 15.0 g/dL 13.3   14.1   14.7    ?Hematocrit 36.0 - 46.0 % 39.9   43.2   45.1    ?Platelets 150 - 400 K/uL 204   213   197    ?  ? ?ASSESSMENT: ?Laura Mcgee is a 71 y.o. female stable, doing well postoperatively.  POD #2. ? ?Patient needs to be evaluated by PT/OT prior to further discussions for surgery. ? ?PLAN: ?Weightbearing: NWB RUE ?Insicional and dressing care: Reinforce dressings as needed ?Orthopedic device(s): sling to right upper arm when out of bed.  Okay to remove when she remains in bed. ?VTE prophylaxis: None currently, please hold until POD#1 ?Pain control: PRN medications, judicious use of narcotics ?Follow - up plan: 2 weeks postop ? ?Based on my discussions with family, as well as the extent of her injury, I do think she would benefit from rehabilitation following discharge from ? ? ?Contact information:   ? ? ?Kuron Docken A. Amedeo Kinsman, MD MS ?[facility] ?[address] ?[place],  [place]  [address] ?Phone: [phone] ?Fax: [phone] ? ?

## 2021-06-28 ENCOUNTER — Telehealth: Payer: Self-pay | Admitting: Orthopedic Surgery

## 2021-06-28 DIAGNOSIS — S42291A Other displaced fracture of upper end of right humerus, initial encounter for closed fracture: Secondary | ICD-10-CM

## 2021-06-28 DIAGNOSIS — I4891 Unspecified atrial fibrillation: Secondary | ICD-10-CM

## 2021-06-28 MED ORDER — AMIODARONE HCL IN DEXTROSE 360-4.14 MG/200ML-% IV SOLN
60.0000 mg/h | INTRAVENOUS | Status: DC
  Administered 2021-06-28: 60 mg/h via INTRAVENOUS
  Filled 2021-06-28: qty 200

## 2021-06-28 MED ORDER — HYDROCODONE-ACETAMINOPHEN 10-325 MG PO TABS: 0.5000 | ORAL_TABLET | Freq: Four times a day (QID) | ORAL | 0 refills | Status: DC | PRN

## 2021-06-28 MED ORDER — AMIODARONE HCL IN DEXTROSE 360-4.14 MG/200ML-% IV SOLN: 30.0000 mg/h | INTRAVENOUS | Status: DC

## 2021-06-28 MED ORDER — DILTIAZEM HCL 25 MG/5ML IV SOLN
5.0000 mg | Freq: Once | INTRAVENOUS | Status: AC
  Administered 2021-06-28: 5 mg via INTRAVENOUS
  Filled 2021-06-28: qty 5

## 2021-06-28 MED ORDER — ADULT MULTIVITAMIN W/MINERALS CH: 1.0000 | ORAL_TABLET | Freq: Every day | ORAL | 0 refills | Status: DC

## 2021-06-28 MED ORDER — AMIODARONE HCL 200 MG PO TABS
200.0000 mg | ORAL_TABLET | Freq: Every day | ORAL | Status: DC
  Administered 2021-06-28: 200 mg via ORAL
  Filled 2021-06-28: qty 1

## 2021-06-28 MED ORDER — FOLIC ACID 1 MG PO TABS: 1.0000 mg | ORAL_TABLET | Freq: Every day | ORAL | 0 refills | Status: DC

## 2021-06-28 MED ORDER — AMIODARONE HCL 200 MG PO TABS: 200.0000 mg | ORAL_TABLET | Freq: Every day | ORAL | 0 refills | Status: DC

## 2021-06-28 MED ORDER — HYDROCODONE-ACETAMINOPHEN 5-325 MG PO TABS
1.0000 | ORAL_TABLET | Freq: Four times a day (QID) | ORAL | Status: DC | PRN
  Administered 2021-06-28: 1 via ORAL
  Filled 2021-06-28: qty 1
  Filled 2021-06-28: qty 2

## 2021-06-28 MED ORDER — THIAMINE HCL 100 MG PO TABS: 100.0000 mg | ORAL_TABLET | Freq: Every day | ORAL | 0 refills | Status: DC

## 2021-06-28 NOTE — Telephone Encounter (Signed)
Maudie Mercury (patients daughter) wants Dr. Amedeo Kinsman to call her back she has some questions for him. ?[phone]  ?

## 2021-06-28 NOTE — Evaluation (Signed)
Occupational Therapy Evaluation ?Patient Details ?Name: Laura Mcgee ?MRN: [id number] ?DOB: Jan 12, 1951 ?Today's Date: 06/28/2021 ? ? ?History of Present Illness Patient is 71 y/o female with history of GERD and [place] who reports an episode of Syncope and fell at home sustaining a right proximal humerus fracture. Surgical repair completed on 06/25/21 by Dr. Amedeo Kinsman.  ? ?Clinical Impression ?  ?Pt in bed upon therapy arrival and agreeable to participate in OT evaluation. Due to recent RUE humerus repair, patient requires Set-up to supervision for basic ADL completion. Education provided regarding shoulder precautions, A/ROM exercises of the elbow, wrist, and hand, as well as UE positioning the sling. Recommend HH OT at discharge if needed to assess safety and ADL completion. OT will follow patient acutely during her stay.  ?   ? ?Recommendations for follow up therapy are one component of a multi-disciplinary discharge planning process, led by the attending physician.  Recommendations may be updated based on patient status, additional functional criteria and insurance authorization.  ? ?Follow Up Recommendations ? Home health OT  ?  ?Assistance Recommended at Discharge PRN  ?Patient can return home with the following A little help with bathing/dressing/bathroom;Assistance with cooking/housework;Assist for transportation ? ?  ?Functional Status Assessment ? Patient has had a recent decline in their functional status and demonstrates the ability to make significant improvements in function in a reasonable and predictable amount of time.  ?Equipment Recommendations ? Tub/shower seat  ?  ?   ?Precautions / Restrictions Precautions ?Precautions: Shoulder ?Type of Shoulder Precautions: Sling on when out of bed. Ok to remove sling when in bed. No A/ROM of right shoulder. ?Shoulder Interventions: Shoulder sling/immobilizer ?Restrictions ?Weight Bearing Restrictions: Yes ?RUE Weight Bearing: Non weight bearing  ? ?  ? ?Mobility Bed  Mobility ?Overal bed mobility: Modified Independent ?  ?  ? ?Transfers ?Overall transfer level: Needs assistance ?Equipment used: None ?Transfers: Sit to/from Stand, Bed to chair/wheelchair/BSC ?Sit to Stand: Min guard ?  ?  ?Step pivot transfers: Min guard ?  ?  ?  ?  ? ?  ?Balance Overall balance assessment: Mild deficits observed, not formally tested ?  ?  ?   ? ?ADL either performed or assessed with clinical judgement  ? ?ADL Overall ADL's : Needs assistance/impaired ?Eating/Feeding: Set up;Sitting ?Eating/Feeding Details (indicate cue type and reason): May need help opening containers due to immobilization of right UE. ?Grooming: Wash/dry hands;Wash/dry face;Brushing hair;Set up;Sitting ?  ?Upper Body Bathing: Minimal assistance;Sitting ?Upper Body Bathing Details (indicate cue type and reason): Assist needed due to RUE precautions. ?Lower Body Bathing: Minimal assistance;Sit to/from stand;Sitting/lateral leans ?Lower Body Bathing Details (indicate cue type and reason): Assist needed due to RUE precautions. ?Upper Body Dressing : Minimal assistance;Sitting;Cueing for UE precautions ?Upper Body Dressing Details (indicate cue type and reason): Assist needed due to RUE precautions. ?Lower Body Dressing: Minimal assistance;Sit to/from stand;Sitting/lateral leans ?Lower Body Dressing Details (indicate cue type and reason): Assist needed due to RUE precautions. ?Toilet Transfer: Supervision/safety;Regular Toilet ?  ?Toileting- Clothing Manipulation and Hygiene: Supervision/safety;Sit to/from stand;Sitting/lateral lean ?  ?  ?  ?Functional mobility during ADLs: Supervision/safety ?   ? ? ? ?Vision Baseline Vision/History: 1 Wears glasses ?Wears Glasses: At all times ?Ability to See in Adequate Light: 0 Adequate ?Patient Visual Report: No change from baseline ?   ?   ?   ?   ? ?Pertinent Vitals/Pain Pain Assessment ?Pain Assessment: 0-10 ?Pain Score: 8  ?Pain Location: right shoulder/upper arm ?Pain Descriptors /  Indicators: Sore ?  Pain Intervention(s): Monitored during session  ? ? ? ?Hand Dominance Right ?  ?Extremity/Trunk Assessment Upper Extremity Assessment ?Upper Extremity Assessment: RUE deficits/detail (LUE is WFL for tasks assessed) ?RUE Deficits / Details: Able to demonstrate active elbow flexion/extension, wrist flexion/extension, open and close hand in a fist. grip is decreased due to recent surgical procedure. ?RUE: Unable to fully assess due to immobilization ?RUE Sensation: WNL ?RUE Coordination: decreased fine motor;decreased gross motor (due to recent surgical procedure) ?  ?Lower Extremity Assessment ?Lower Extremity Assessment: Defer to PT evaluation ?  ?  ?  ?Communication Communication ?Communication: No difficulties ?  ?Cognition Arousal/Alertness: Awake/alert ?Behavior During Therapy: [facility] for tasks assessed/performed ?Overall Cognitive Status: Within Functional Limits for tasks assessed ?  ?  ?  ?   ?   ?Shoulder Instructions Shoulder Instructions ?Correct positioning of sling/immobilizer: Set-up ?ROM for elbow, wrist and digits of operated UE: Modified independent ?Sling wearing schedule (on at all times/off for ADL's): Set-up ?Positioning of UE while sleeping: Supervision/safety  ? ? ?Home Living Family/patient expects to be discharged to:: Private residence ?Living Arrangements: Alone ?Available Help at Discharge: Family ?Type of Home: Mobile home ?Home Access: Stairs to enter ?Entrance Stairs-Number of Steps: 5 ?Entrance Stairs-Rails: Left;Right ?Home Layout: One level ?  ?  ?Bathroom Shower/Tub: Tub/shower unit ?  ?Bathroom Toilet: Standard (Uses toilet riser) ?  ?  ?Home Equipment: Kasandra Knudsen - single point;Toilet riser;Grab bars - tub/shower ?  ?  ?  ? ?  ?Prior Functioning/Environment Prior Level of Function : Independent/Modified Independent ?  ?  ?  ? ?  ?  ?OT Problem List: Impaired UE functional use;Pain ?  ?   ?OT Treatment/Interventions: Self-care/ADL training;Therapeutic exercise;Therapeutic  activities;Neuromuscular education;DME and/or AE instruction;Patient/family education;Balance training;Manual therapy;Modalities  ?  ?OT Goals(Current goals can be found in the care plan section) Acute Rehab OT Goals ?Patient Stated Goal: to get stronger ?OT Goal Formulation: Patient unable to participate in goal setting ?Time For Goal Achievement: 07/01/21 ?Potential to Achieve Goals: Good  ?OT Frequency: Min 3X/week ?  ? ?Co-evaluation PT/OT/SLP Co-Evaluation/Treatment: Yes ?Reason for Co-Treatment: To address functional/ADL transfers ?  ?  ?  ? ?  ?AM-PAC OT "6 Clicks" Daily Activity     ?Outcome Measure Help from another person eating meals?: A Little ?Help from another person taking care of personal grooming?: A Little ?Help from another person toileting, which includes using toliet, bedpan, or urinal?: A Little ?Help from another person bathing (including washing, rinsing, drying)?: A Little ?Help from another person to put on and taking off regular upper body clothing?: A Little ?Help from another person to put on and taking off regular lower body clothing?: A Little ?6 Click Score: 18 ?  ?End of Session   ? ?Activity Tolerance: Patient tolerated treatment well ?Patient left: in chair;with call bell/phone within reach ? ?OT Visit Diagnosis: History of falling (Z91.81)  ?              ?Time: 0370-4888 ?OT Time Calculation (min): 21 min ?Charges:  OT General Charges ?$OT Visit: 1 Visit ?OT Evaluation ?$OT Eval Low Complexity: 1 Low ?OT Treatments ?$Therapeutic Exercise: 8-22 mins (8') ? ?Ailene Ravel, OTR/L,CBIS  ?[phone] ? ? ?Karliah Kowalchuk, Clarene Duke ?06/28/2021, 9:56 AM ?

## 2021-06-28 NOTE — Progress Notes (Signed)
Report called to [facility]. 

## 2021-06-28 NOTE — Progress Notes (Addendum)
ORTHOPAEDIC PROGRESS NOTE  S/p INTRAMEDULLARY (IM) NAIL HUMERAL - R humerus   DOS: 06/25/2021  SUBJECTIVE: Continues to have issues with heart rate, back on Amiodarone drip.  Pain is controlled.  She has noticed that she can extend her elbow.  She struggled to get some rest last night.   OBJECTIVE: PE:  Alert and oriented.  No acute distress.  Proximal dressing has been changed. Dressings otherwise are clean, and intact.  Biceps dressing has some dried blood.  She has active motion intact of the right hand.  She is able to fully extend her elbow, wrist and her fingers.  2+ radial pulse.     Vitals:   06/28/21 0500 06/28/21 0600  BP: (!) 149/73 (!) 87/58  Pulse: 68 (!) 46  Resp: 16 16  Temp: 97.8 F (36.6 C)   SpO2: 92% 92%      Latest Ref Rng & Units 06/27/2021    6:27 AM 06/26/2021    5:27 AM 06/25/2021    4:52 AM  CBC  WBC 4.0 - 10.5 K/uL 11.5   13.2   9.7    Hemoglobin 12.0 - 15.0 g/dL 16.1   09.6   04.5    Hematocrit 36.0 - 46.0 % 39.9   43.2   45.1    Platelets 150 - 400 K/uL 204   213   197       ASSESSMENT: Laura Mcgee is a 71 y.o. female stable, doing well postoperatively.  POD #.    PLAN: Weightbearing: NWB RUE Insicional and dressing care: Reinforce dressings as needed Orthopedic device(s): sling to right upper arm when out of bed.  Okay to remove when she remains in bed. VTE prophylaxis: None currently, please hold until POD#1 Pain control: PRN medications, judicious use of narcotics Follow - up plan: 2 weeks postop  Based on my discussions with family, as well as the extent of her injury, I do think she would benefit from rehabilitation following discharge from   Contact information:     Trudie Cervantes A. Dallas Schimke, MD MS [facility] [address] [place],  [place]  [address] Phone: [phone] Fax: [phone]

## 2021-06-28 NOTE — [place] FL2 (Signed)
?[place] MEDICAID FL2 LEVEL OF CARE SCREENING TOOL  ?  ? ?IDENTIFICATION  ?Patient Name: ?Laura Mcgee Birthdate: 04/08/1950 Sex: female Admission Date (Current Location): ?06/23/2021  ?[place] and [place] Number: ? [place] and Address:  ?[place] [address], [place] ?     Provider Number: ?[id number]  ?Attending Physician Name and Address:  ?[name], MD ? Relative Name and Phone Number:  ?  ?   ?Current Level of Care: ?Hospital Recommended Level of Care: ?[place] Prior Approval Number: ?  ? ?Date Approved/Denied: ?  PASRR Number: ?  ? ?Discharge Plan: ?SNF ?  ? ?Current Diagnoses: ?Patient Active Problem List  ? Diagnosis Date Noted  ? Atrial fibrillation with RVR ([place]) 06/26/2021  ? Closed fracture of right proximal humerus 06/25/2021  ? Alcohol abuse 06/25/2021  ? Syncope 06/24/2021  ? Fall at home, initial encounter 06/24/2021  ? Hypoalbuminemia due to protein-calorie malnutrition ([place]) 06/24/2021  ? Leukocytosis 06/24/2021  ? Essential hypertension 06/24/2021  ? Mixed hyperlipidemia 06/24/2021  ? Peripheral neuropathy 04/03/2019  ? Spinal stenosis at L4-L5 level 04/03/2019  ? RUQ pain 11/20/2018  ? Change in bowel function 11/20/2018  ? Paresthesia 10/04/2018  ? Gait abnormality 10/04/2018  ? Incontinence of feces 10/04/2018  ? Chronic cough 04/27/2016  ? Constipation 04/27/2016  ? GERD (gastroesophageal reflux disease) 03/30/2015  ? Dysphagia 03/30/2015  ? Encounter for screening colonoscopy 03/30/2015  ? ? ?Orientation RESPIRATION BLADDER Height & Weight   ?  ?Self, Time, Situation, Place ? Normal Continent Weight: 189 lb 2.5 oz (85.8 kg) ?Height:  '5\' 6"'$  (167.6 cm)  ?BEHAVIORAL SYMPTOMS/MOOD NEUROLOGICAL BOWEL NUTRITION STATUS  ?    Continent Diet (see dc summary)  ?AMBULATORY STATUS COMMUNICATION OF NEEDS Skin   ?Limited Assist Verbally Surgical wounds ?  ?  ?  ?    ?     ?     ? ? ?Personal Care Assistance Level of Assistance   ?Bathing, Feeding, Dressing Bathing Assistance: Maximum assistance ?Feeding assistance: Independent ?Dressing Assistance: Maximum assistance ?   ? ?Functional Limitations Info  ?Sight, Hearing, Speech Sight Info: Adequate ?Hearing Info: Adequate ?Speech Info: Adequate  ? ? ?SPECIAL CARE FACTORS FREQUENCY  ?PT (By licensed PT), OT (By licensed OT)   ?  ?PT Frequency: 3x week ?OT Frequency: 3x week ?  ?  ?  ?   ? ? ?Contractures Contractures Info: Not present  ? ? ?Additional Factors Info  ?Code Status, Allergies, Psychotropic Code Status Info: Full ?Allergies Info: Gabapentin, Tetracyclines and related ?Psychotropic Info: Cymbalta, Effexor ?  ?  ?   ? ?Current Medications (06/28/2021):  This is the current hospital active medication list ?Current Facility-Administered Medications  ?Medication Dose Route Frequency Provider Last Rate Last Admin  ? acetaminophen (TYLENOL) tablet 650 mg  650 mg Oral Q6H PRN Mordecai Rasmussen, MD      ? Or  ? acetaminophen (TYLENOL) suppository 650 mg  650 mg Rectal Q6H PRN Mordecai Rasmussen, MD      ? amiodarone (PACERONE) tablet 200 mg  200 mg Oral Daily [name], MD   200 mg at 06/28/21 0847  ? Chlorhexidine Gluconate Cloth 2 % PADS 6 each  6 each Topical Daily Dessa Phi, DO   6 each at 06/27/21 1000  ? diltiazem (CARDIZEM) 125 mg in dextrose 5% 125 mL (1 mg/mL) infusion  5-15 mg/hr Intravenous Titrated Roxan Hockey, MD   Stopped at 06/28/21  [id number]  ? diltiazem (CARDIZEM) injection 10 mg  10 mg Intravenous Once Emokpae, Courage, MD      ? DULoxetine (CYMBALTA) DR capsule 60 mg  60 mg Oral Daily Mordecai Rasmussen, MD   60 mg at 06/28/21 0848  ? feeding supplement (ENSURE ENLIVE / ENSURE PLUS) liquid 237 mL  237 mL Oral BID BM Mordecai Rasmussen, MD   237 mL at 06/24/21 0911  ? folic acid (FOLVITE) tablet 1 mg  1 mg Oral Daily Mordecai Rasmussen, MD   1 mg at 06/28/21 0848  ? HYDROcodone-acetaminophen (NORCO/VICODIN) 5-325 MG per tablet 1-2 tablet  1-2 tablet Oral Q6H PRN [name], MD   1 tablet at 06/28/21 1005  ? irbesartan (AVAPRO) tablet 150 mg  150 mg Oral Daily Dessa Phi, DO   150 mg at 06/28/21 0848  ? lactated ringers infusion   Intravenous Continuous Louann Sjogren, MD   Stopped at 06/26/21 1617  ? multivitamin with minerals tablet 1 tablet  1 tablet Oral Daily Mordecai Rasmussen, MD   1 tablet at 06/28/21 0847  ? ondansetron (ZOFRAN) tablet 4 mg  4 mg Oral Q6H PRN Mordecai Rasmussen, MD      ? Or  ? ondansetron [facility]) injection 4 mg  4 mg Intravenous Q6H PRN Mordecai Rasmussen, MD      ? pantoprazole (PROTONIX) EC tablet 40 mg  40 mg Oral Daily Mordecai Rasmussen, MD   40 mg at 06/28/21 0848  ? pravastatin (PRAVACHOL) tablet 20 mg  20 mg Oral q1800 Mordecai Rasmussen, MD   20 mg at 06/27/21 1709  ? thiamine tablet 100 mg  100 mg Oral Daily Mordecai Rasmussen, MD   100 mg at 06/28/21 0848  ? Or  ? thiamine (B-1) injection 100 mg  100 mg Intravenous Daily Mordecai Rasmussen, MD      ? venlafaxine XR (EFFEXOR-XR) 24 hr capsule 300 mg  300 mg Oral QPM Mordecai Rasmussen, MD   300 mg at 06/27/21 1709  ? ? ? ?Discharge Medications: ?Please see discharge summary for a list of discharge medications. ? ?Relevant Imaging Results: ? ?Relevant Lab Results: ? ? ?Additional Information ?SSN: [id number] ? ?Shade Flood, LCSW ? ? ? ? ?

## 2021-06-28 NOTE — Progress Notes (Signed)
Transition of Care (TOC) -30 day Note   ?  ?  ?Patient Details  ?Name: Laura Mcgee ?Date of Birth: [birth year] ?  ?Transition of Care (TOC) CM/SW Contact  ?Name: Shade Flood ?Phone Number: [phone] ?Date: 06/28/2021 ?  ?  ?To Whom it May Concern: ?  ?Please be advised that the above patient will require a short-term nursing home stay, anticipated 30 days or less rehabilitation and strengthening. The plan is for return home.  ?  ? ?

## 2021-06-28 NOTE — Addendum Note (Signed)
Addendum  created 06/28/21 0803 by Orlie Dakin, CRNA  ? Intraprocedure Staff edited  ?  ?

## 2021-06-28 NOTE — Progress Notes (Signed)
Converted back to AFIB RVR.  Heart rate 130-158. Restarted amiodarone at half dose of 30. Contacted oncall provider . He is ok with Amiodarone restarting .BP 113/59, MAP 79 ?

## 2021-06-28 NOTE — Progress Notes (Signed)
Persistent AFIB RVR , heart rate 120-158. BP 101/63. Discussed with on call provider . Ok to increase to '60mg'$   ?

## 2021-06-28 NOTE — Care Management Important Message (Signed)
Important Message ? ?Patient Details  ?Name: Laura Mcgee ?MRN: [id number] ?Date of Birth: August 28, 1950 ? ? ?Medicare Important Message Given:  Yes ? ? ? ? ?Tommy Medal ?06/28/2021, 12:19 PM ?

## 2021-06-28 NOTE — Discharge Summary (Signed)
Physician Discharge Summary  ?Laura Mcgee TDS:[id number] DOB: 03/10/1950 DOA: 06/23/2021 ? ?PCP: Lemmie Evens, MD ? ?Admit date: 06/23/2021 ?Discharge date: 06/28/2021 ? ?Admitted From: Home ?Disposition: SNF ? ?Recommendations for Outpatient Follow-up:  ?Follow up with PCP in 1-2 weeks ?Please obtain BMP/CBC in one week ?Please follow up with orthopedic surgery as scheduled ? ?Discharge Condition: Stable ?CODE STATUS: Full ?Diet recommendation: As tolerated ? ?Brief/Interim Summary: ?Laura Mcgee is a 71 y.o. female with medical history significant of hypertension, hyperlipidemia, GERD who presents to the emergency department after having a syncopal episode at home that resulted in a fall with a subsequent right humeral fracture. Patient states after multiple attempts to ambulate with profound dizziness and ultimately episode of syncope for "only a few minutes" and woke up with painful right upper extremity. She was found to have right proximal humerus fracture and admitted, orthopedic surgery consulted. Patient underwent intramedullary nail fixation by Dr. Amedeo Kinsman 4/21. Per SW, patient's family reported that pt has history of alcohol abuse, drinks about 4-6 beers daily, although patient downplays her drinking. She was put on CIWA protocol.  ? ?Patient admitted as above with syncopal episode due to orthostatic hypotension complicated by alcohol abuse with subsequent right humeral fracture status post fixation 06/25/2021 with Dr. Amedeo Kinsman.  Patient tolerated procedure quite well, blood pressure medication was adjusted during hospitalization due to concern for ongoing hypotension or orthostatic symptoms, A-fib is now appropriately managed with amiodarone and blood pressure appears to be well controlled on amlodipine.  See medication changes as below. ? ?There was lengthy discussion between hospitalist surgical team and family about safe disposition, at this time patient has been accepted to nursing facility per family  request and is otherwise stable for discharge. ? ?Discharge Diagnoses:  ?Principal Problem: ?  Syncope ?Active Problems: ?  Atrial fibrillation with RVR ([place]) ?  GERD (gastroesophageal reflux disease) ?  Peripheral neuropathy ?  Fall at home, initial encounter ?  Hypoalbuminemia due to protein-calorie malnutrition ([place]) ?  Leukocytosis ?  Essential hypertension ?  Mixed hyperlipidemia ?  Closed fracture of right proximal humerus ?  Alcohol abuse ? ? ? ?Discharge Instructions ? ?Discharge Instructions   ? ? Discharge patient   Complete by: As directed ?  ? Discharge disposition: 06-Home-Health Care Svc  ? Discharge patient date: 06/28/2021  ? Face-to-face encounter (required for Medicare/Medicaid patients)   Complete by: As directed ?  ? I Laura Mcgee certify that this patient is under my care and that I, or a nurse practitioner or physician's assistant working with me, had a face-to-face encounter that meets the physician face-to-face encounter requirements with this patient on 06/28/2021. The encounter with the patient was in whole, or in part for the following medical condition(s) which is the primary reason for home health care (List medical condition): ambulatory dysfunction  ? The encounter with the patient was in whole, or in part, for the following medical condition, which is the primary reason for home health care: ambulatory dysfunction  ? I certify that, based on my findings, the following services are medically necessary home health services: Physical therapy  ? Reason for Medically Necessary Home Health Services:  Skilled Nursing- Change/Decline in Patient Status ?Therapy- Personnel officer, Public librarian  ?  ? My clinical findings support the need for the above services: Unable to leave home safely without assistance and/or assistive device  ? Further, I certify that my clinical findings support that this patient is homebound due to: Unable to  leave home safely without  assistance  ? Home Health   Complete by: As directed ?  ? To provide the following care/treatments: PT  ? ?  ? ?Allergies as of 06/28/2021   ? ?   Reactions  ? Gabapentin Other (See Comments)  ? dizziness  ? Tetracyclines & Related Other (See Comments)  ? Makes her sick on her stomach   ? ?  ? ?  ?Medication List  ?  ? ?TAKE these medications   ? ?amiodarone 200 MG tablet ?Commonly known as: PACERONE ?Take 1 tablet (200 mg total) by mouth daily. ?Start taking on: June 29, 2021 ?  ?amLODipine 10 MG tablet ?Commonly known as: NORVASC ?Take 10 mg by mouth daily. ?  ?DULoxetine 30 MG capsule ?Commonly known as: CYMBALTA ?Take 2 capsules by mouth daily. ?  ?folic acid 1 MG tablet ?Commonly known as: FOLVITE ?Take 1 tablet (1 mg total) by mouth daily. ?Start taking on: June 29, 2021 ?  ?HYDROcodone-acetaminophen 10-325 MG tablet ?Commonly known as: NORCO ?Take 0.5-1 tablets by mouth in the morning, at noon, and at bedtime. ?  ?lovastatin 20 MG tablet ?Commonly known as: MEVACOR ?Take 20 mg by mouth daily. ?  ?multivitamin with minerals Tabs tablet ?Take 1 tablet by mouth daily. ?Start taking on: June 29, 2021 ?  ?olmesartan 20 MG tablet ?Commonly known as: BENICAR ?Take 20 mg by mouth daily. ?  ?pantoprazole 40 MG tablet ?Commonly known as: PROTONIX ?Take 40 mg by mouth daily. ?  ?thiamine 100 MG tablet ?Take 1 tablet (100 mg total) by mouth daily. ?Start taking on: June 29, 2021 ?  ?venlafaxine XR 150 MG 24 hr capsule ?Commonly known as: EFFEXOR-XR ?Take 2 capsules by mouth every evening. ?  ? ?  ? ?  ?  ? ? ?  ?Durable Medical Equipment  ?(From admission, onward)  ?  ? ? ?  ? ?  Start     Ordered  ? Unscheduled  For home use only DME 4 wheeled rolling walker with seat  Once       ?Question:  Patient needs a walker to treat with the following condition  Answer:  Ambulatory dysfunction  ? 06/28/21 1051  ? ?  ?  ? ?  ? ? ?Allergies  ?Allergen Reactions  ? Gabapentin Other (See Comments)  ?  dizziness  ? Tetracyclines &  Related Other (See Comments)  ?  Makes her sick on her stomach   ? ? ?Consultations: ?Orthopedic surgery, Dr. Amedeo Kinsman ? ?Procedures/Studies: ?CT Head Wo Contrast ? ?Result Date: 06/24/2021 ?CLINICAL DATA:  Syncope, fall, head trauma EXAM: CT HEAD WITHOUT CONTRAST TECHNIQUE: Contiguous axial images were obtained from the base of the skull through the vertex without intravenous contrast. RADIATION DOSE REDUCTION: This exam was performed according to the departmental dose-optimization program which includes automated exposure control, adjustment of the mA and/or kV according to patient size and/or use of iterative reconstruction technique. COMPARISON:  06/28/2018 FINDINGS: Brain: Normal anatomic configuration. Parenchymal volume loss is commensurate with the patient's age. Moderate periventricular white matter changes are present likely reflecting the sequela of small vessel ischemia, progressive since prior examination. No abnormal intra or extra-axial mass lesion or fluid collection. No abnormal mass effect or midline shift. No evidence of acute intracranial hemorrhage or infarct. Ventricular size is normal. Cerebellum unremarkable. Vascular: No asymmetric hyperdense vasculature at the skull base. Skull: Intact Sinuses/Orbits: There is near complete opacification of the left maxillary sinus, and air-fluid level within the right  maxillary sinus, and a small air-fluid level seen within the left sphenoid sinus. Remaining paranasal sinuses are clear. Orbits are unremarkable. Other: Left mastoid air cells are fluid opacified inferiorly. Middle ear cavity is clear. Right mastoid air cells and middle ear cavities are clear. IMPRESSION: No acute intracranial injury.  No calvarial fracture. Progressive moderate senescent change. Moderate bilateral paranasal sinus disease Left mastoid effusion Electronically Signed   By: Fidela Salisbury M.D.   On: 06/24/2021 00:20  ? ?US Carotid Bilateral ? ?Result Date: 06/24/2021 ?CLINICAL DATA:   71 year old female with history of syncopal episode. EXAM: BILATERAL CAROTID DUPLEX ULTRASOUND TECHNIQUE: Pearline Cables scale imaging, color Doppler and duplex ultrasound were performed of bilateral carotid and vert

## 2021-06-28 NOTE — TOC Transition Note (Signed)
Transition of Care (TOC) - CM/SW Discharge Note ? ? ?Patient Details  ?Name: Laura Mcgee ?MRN: [id number] ?Date of Birth: 1950-10-22 ? ?Transition of Care (TOC) CM/SW Contact:  ?Shade Flood, LCSW ?Phone Number: ?06/28/2021, 4:47 PM ? ? ?Clinical Narrative:    ? ?Pt has insurance authorization for snf. Updated pt and dtr. They remain agreeable to transfer to [facility] this afternoon. ? ?DC clinical sent electronically. RN to call report. ? ?There are no other TOC needs for dc. ? ?Final next level of care: [place] ?Barriers to Discharge: Barriers Resolved ? ? ?Patient Goals and CMS Choice ?Patient states their goals for this hospitalization and ongoing recovery are:: go to rehab ?CMS Medicare.gov Compare Post Acute Care list provided to:: Patient Represenative (must comment) ?Choice offered to / list presented to : Adult Children ? ?Discharge Placement ?PASRR number recieved: 06/28/21 ?           ?Patient [facility] bed at: [facility] ?Patient to be transferred to facility by: w/c ?Name of family member notified: Maudie Mercury ?Patient and family notified of of transfer: 06/28/21 ? ?Discharge Plan and Services ?In-house Referral: Clinical Social Work ?  ?Post Acute Care Choice: [place]          ?  ?  ?  ?  ?  ?  ?  ?  ?  ?  ? ?Social Determinants of Health (SDOH) Interventions ?  ? ? ?Readmission Risk Interventions ?   ? View : No data to display.  ?  ?  ?  ? ? ? ? ? ?

## 2021-06-28 NOTE — TOC Progression Note (Signed)
Transition of Care (TOC) - Progression Note  ? ? ?Patient Details  ?Name: Laura Mcgee ?MRN: [id number] ?Date of Birth: [birth year] ? ?Transition of Care (TOC) CM/SW Contact  ?Shade Flood, LCSW ?Phone Number: ?06/28/2021, 10:31 AM ? ?Clinical Narrative:    ? ?TOC following. Received call from pt's daughter stating that she and pt would like SNF referral for dc. Explained that TOC can make referrals and start insurance authorization. If insurance doe not approve, they can pay privately or take pt home with [facility]. Dtr verbalized understanding. Pt medically stable for dc per MD. ? ?Expected Discharge Plan: [place] ?Barriers to Discharge: Continued Medical Work up ? ?Expected Discharge Plan and Services ?Expected Discharge Plan: [place] ?In-house Referral: Clinical Social Work ?  ?Post Acute Care Choice: [place] ?Living arrangements for the past 2 months: [place] ?                ?  ?  ?  ?  ?  ?  ?  ?  ?  ?  ? ? ?Social Determinants of Health (SDOH) Interventions ?  ? ?Readmission Risk Interventions ?   ? View : No data to display.  ?  ?  ?  ? ? ?

## 2021-06-28 NOTE — Plan of Care (Signed)
?  Problem: Acute Rehab PT Goals(only PT should resolve) ?Goal: Pt Will Go Supine/Side To Sit ?Outcome: Progressing ?Flowsheets (Taken 06/28/2021 1146) ?Pt will go Supine/Side to Sit: ? Independently ? with modified independence ?Goal: Patient Will Transfer Sit To/From Stand ?Outcome: Progressing ?Flowsheets (Taken 06/28/2021 1146) ?Patient will transfer sit to/from stand: ? with modified independence ? with supervision ?Goal: Pt Will Transfer Bed To Chair/Chair To Bed ?Outcome: Progressing ?Flowsheets (Taken 06/28/2021 1146) ?Pt will Transfer Bed to Chair/Chair to Bed: ? with modified independence ? with supervision ?Goal: Pt Will Ambulate ?Outcome: Progressing ?Flowsheets (Taken 06/28/2021 1146) ?Pt will Ambulate: ? 100 feet ? with modified independence ? with supervision ? with least restrictive assistive device ? with cane ?  ?11:46 AM, 06/28/21 ?Lonell Grandchild, MPT ?Physical Therapist with [place] ?[facility] ?[phone] office ?[phone] mobile phone ? ?

## 2021-06-28 NOTE — Evaluation (Signed)
Physical Therapy Evaluation ?Patient Details ?Name: Laura Mcgee ?MRN: [id number] ?DOB: 1951-03-05 ?Today's Date: 06/28/2021 ? ?History of Present Illness ? Patient is 71 y/o female with history of GERD and [place] who reports an episode of Syncope and fell at home sustaining a right proximal humerus fracture. Surgical repair completed on 06/25/21 by Dr. Amedeo Kinsman. ?  ?Clinical Impression ? Patient demonstrates good return for bed mobility, transferring to chair and to/from commode with increased time and labored movement.  Patient tolerated ambulation in room/hallway without loss of balance, occasional leaning on nearby objects for support and limited mostly due to fatigue.  Patient encouraged to use her [facility] for longer distances when return home.  Patient tolerated sitting up in chair after therapy - RN notified.  Patient will benefit from continued skilled physical therapy in hospital and recommended venue below to increase strength, balance, endurance for safe ADLs and gait.  ?   ?   ? ?Recommendations for follow up therapy are one component of a multi-disciplinary discharge planning process, led by the attending physician.  Recommendations may be updated based on patient status, additional functional criteria and insurance authorization. ? ?Follow Up Recommendations Home health PT ? ?  ?Assistance Recommended at Discharge Set up Supervision/Assistance  ?Patient can return home with the following ? A little help with walking and/or transfers;A little help with bathing/dressing/bathroom;Help with stairs or ramp for entrance;Assistance with cooking/housework ? ?  ?Equipment Recommendations None recommended by PT  ?Recommendations for Other Services ?    ?  ?Functional Status Assessment Patient has had a recent decline in their functional status and demonstrates the ability to make significant improvements in function in a reasonable and predictable amount of time.  ? ?  ?Precautions / Restrictions Precautions ?Precautions:  Shoulder;Fall ?Type of Shoulder Precautions: Sling on when out of bed. Ok to remove sling when in bed. No A/ROM of right shoulder. ?Shoulder Interventions: Shoulder sling/immobilizer ?Restrictions ?Weight Bearing Restrictions: Yes ?RUE Weight Bearing: Non weight bearing  ? ?  ? ?Mobility ? Bed Mobility ?Overal bed mobility: Modified Independent ?  ?  ?  ?  ?  ?  ?  ?  ? ?Transfers ?Overall transfer level: Needs assistance ?Equipment used: None ?Transfers: Sit to/from Stand, Bed to chair/wheelchair/BSC ?Sit to Stand: Min guard ?  ?Step pivot transfers: Min guard ?  ?  ?  ?General transfer comment: as per OT notes ?  ? ?Ambulation/Gait ?Ambulation/Gait assistance: Supervision, Min guard ?Gait Distance (Feet): 55 Feet ?Assistive device: Rolling walker (2 wheels) ?Gait Pattern/deviations: Decreased step length - right, Decreased step length - left, Decreased stride length ?Gait velocity: decreased ?  ?  ?General Gait Details: slow slightly labored cadence with occasional leaning on nearby objects without loss of balance, limited mostly due to fatigue ? ?Stairs ?  ?  ?  ?  ?  ? ?Wheelchair Mobility ?  ? ?Modified Rankin (Stroke Patients Only) ?  ? ?  ? ?Balance Overall balance assessment: Mild deficits observed, not formally tested ?  ?  ?  ?  ?  ?  ?  ?  ?  ?  ?  ?  ?  ?  ?  ?  ?  ?  ?   ? ? ? ?Pertinent Vitals/Pain Pain Assessment ?Pain Assessment: 0-10 ?Pain Score: 8  ?Pain Location: right shoulder/upper arm ?Pain Descriptors / Indicators: Sore ?Pain Intervention(s): Limited activity within patient's tolerance, Monitored during session  ? ? ?Home Living Family/patient expects to be discharged to:: Private residence ?  Living Arrangements: Alone ?Available Help at Discharge: Family ?Type of Home: Mobile home ?Home Access: Stairs to enter ?Entrance Stairs-Rails: Left;Right ?Entrance Stairs-Number of Steps: 5 ?  ?Home Layout: One level ?Home Equipment: Kasandra Knudsen - single point;Toilet riser;Grab bars - tub/shower ?   ?  ?Prior  Function Prior Level of Function : Independent/Modified Independent ?  ?  ?  ?  ?  ?  ?  ?  ?  ? ? ?Hand Dominance  ? Dominant Hand: Right ? ?  ?Extremity/Trunk Assessment  ? Upper Extremity Assessment ?Upper Extremity Assessment: Defer to OT evaluation ?RUE Deficits / Details: Able to demonstrate active elbow flexion/extension, wrist flexion/extension, open and close hand in a fist. grip is decreased due to recent surgical procedure. ?RUE: Unable to fully assess due to immobilization ?RUE Sensation: WNL ?RUE Coordination: decreased fine motor;decreased gross motor (due to recent surgical procedure) ?  ? ?Lower Extremity Assessment ?Lower Extremity Assessment: Generalized weakness ?  ? ?Cervical / Trunk Assessment ?Cervical / Trunk Assessment: Normal  ?Communication  ? Communication: No difficulties  ?Cognition Arousal/Alertness: Awake/alert ?Behavior During Therapy: [facility] for tasks assessed/performed ?Overall Cognitive Status: Within Functional Limits for tasks assessed ?  ?  ?  ?  ?  ?  ?  ?  ?  ?  ?  ?  ?  ?  ?  ?  ?  ?  ?  ? ?  ?General Comments   ? ?  ?Exercises    ? ?Assessment/Plan  ?  ?PT Assessment Patient needs continued PT services  ?PT Problem List Decreased strength;Decreased activity tolerance;Decreased balance;Decreased mobility ? ?   ?  ?PT Treatment Interventions DME instruction;Gait training;Stair training;Functional mobility training;Therapeutic activities;Therapeutic exercise;Patient/family education;Balance training   ? ?PT Goals (Current goals can be found in the Care Plan section)  ?Acute Rehab PT Goals ?Patient Stated Goal: return home with family to assist ?PT Goal Formulation: With patient ?Time For Goal Achievement: 07/02/21 ?Potential to Achieve Goals: Good ? ?  ?Frequency Min 3X/week ?  ? ? ?Co-evaluation PT/OT/SLP Co-Evaluation/Treatment: Yes ?Reason for Co-Treatment: To address functional/ADL transfers ?PT goals addressed during session: Mobility/safety with mobility;Balance ?  ?  ? ? ?   ?AM-PAC PT "6 Clicks" Mobility  ?Outcome Measure Help needed turning from your back to your side while in a flat bed without using bedrails?: None ?Help needed moving from lying on your back to sitting on the side of a flat bed without using bedrails?: None ?Help needed moving to and from a bed to a chair (including a wheelchair)?: A Little ?Help needed standing up from a chair using your arms (e.g., wheelchair or bedside chair)?: A Little ?Help needed to walk in hospital room?: A Little ?Help needed climbing 3-5 steps with a railing? : A Little ?6 Click Score: 20 ? ?  ?End of Session   ?Activity Tolerance: Patient tolerated treatment well;Patient limited by fatigue ?Patient left: in chair;with call bell/phone within reach ?Nurse Communication: Mobility status ?PT Visit Diagnosis: Unsteadiness on feet (R26.81);Other abnormalities of gait and mobility (R26.89);Muscle weakness (generalized) (M62.81) ?  ? ?Time: [phone] ?PT Time Calculation (min) (ACUTE ONLY): 22 min ? ? ?Charges:   PT Evaluation ?$PT Eval Moderate Complexity: 1 Mod ?PT Treatments ?$Therapeutic Activity: 8-22 mins ?  ?   ? ? ?11:42 AM, 06/28/21 ?Lonell Grandchild, MPT ?Physical Therapist with [place] ?[facility] ?[phone] office ?[phone] mobile phone ? ? ?

## 2021-06-28 NOTE — Plan of Care (Signed)
?  Problem: Acute Rehab OT Goals (only OT should resolve) ?Goal: Pt. Will Perform Grooming ?Flowsheets (Taken 06/28/2021 1002) ?Pt Will Perform Grooming: ? with set-up ? standing ? sitting ?Goal: Pt. Will Perform Upper Body Bathing ?Flowsheets (Taken 06/28/2021 1002) ?Pt Will Perform Upper Body Bathing: ? with set-up ? with supervision ? sitting ?Note: Assist with RUE as needed (positioning, etc). Assist to wash LUE due to RUE precautions ?Goal: Pt. Will Perform Lower Body Bathing ?Flowsheets (Taken 06/28/2021 1002) ?Pt Will Perform Lower Body Bathing: ? with supervision ? with set-up ? sit to/from stand ? sitting/lateral leans ?Goal: Pt. Will Perform Upper Body Dressing ?Flowsheets (Taken 06/28/2021 1002) ?Pt Will Perform Upper Body Dressing: ? with supervision ? with set-up ? sitting ?Note: Using hemi dressing techniques (demonstration or verbal) ?Goal: Pt. Will Perform Lower Body Dressing ?Flowsheets (Taken 06/28/2021 1002) ?Pt Will Perform Lower Body Dressing: ? with supervision ? with set-up ? sitting/lateral leans ? sit to/from stand ?Goal: Pt. Will Transfer To Toilet ?Flowsheets (Taken 06/28/2021 1002) ?Pt Will Transfer to Toilet: ? with modified independence ? bedside commode ?Goal: Pt. Will Perform Toileting-Clothing Manipulation ?Flowsheets (Taken 06/28/2021 1002) ?Pt Will Perform Toileting - Clothing Manipulation and hygiene: ? with modified independence ? sitting/lateral leans ? sit to/from stand ?Goal: Pt/Caregiver Will Perform Home Exercise Program ?Flowsheets (Taken 06/28/2021 1002) ?Pt/caregiver will Perform Home Exercise Program: ? Right Upper extremity ? With written HEP provided ? Independently ?Note: Follow RUE precautions ?  ?

## 2021-06-29 ENCOUNTER — Non-Acute Institutional Stay (SKILLED_NURSING_FACILITY): Payer: Medicare Other | Admitting: Adult Health

## 2021-06-29 ENCOUNTER — Other Ambulatory Visit: Payer: Self-pay | Admitting: Adult Health

## 2021-06-29 ENCOUNTER — Encounter: Payer: Self-pay | Admitting: Adult Health

## 2021-06-29 DIAGNOSIS — I10 Essential (primary) hypertension: Secondary | ICD-10-CM

## 2021-06-29 DIAGNOSIS — G6289 Other specified polyneuropathies: Secondary | ICD-10-CM

## 2021-06-29 DIAGNOSIS — I4891 Unspecified atrial fibrillation: Secondary | ICD-10-CM | POA: Diagnosis not present

## 2021-06-29 DIAGNOSIS — I700 Atherosclerosis of aorta: Secondary | ICD-10-CM | POA: Diagnosis not present

## 2021-06-29 DIAGNOSIS — S42291S Other displaced fracture of upper end of right humerus, sequela: Secondary | ICD-10-CM | POA: Diagnosis not present

## 2021-06-29 DIAGNOSIS — F1010 Alcohol abuse, uncomplicated: Secondary | ICD-10-CM

## 2021-06-29 DIAGNOSIS — E8809 Other disorders of plasma-protein metabolism, not elsewhere classified: Secondary | ICD-10-CM

## 2021-06-29 DIAGNOSIS — K219 Gastro-esophageal reflux disease without esophagitis: Secondary | ICD-10-CM

## 2021-06-29 DIAGNOSIS — E46 Unspecified protein-calorie malnutrition: Secondary | ICD-10-CM

## 2021-06-29 DIAGNOSIS — Z720 Tobacco use: Secondary | ICD-10-CM

## 2021-06-29 MED ORDER — HYDROCODONE-ACETAMINOPHEN 10-325 MG PO TABS
0.5000 | ORAL_TABLET | Freq: Three times a day (TID) | ORAL | 0 refills | Status: DC | PRN
Start: 2021-06-29 — End: 2021-06-30

## 2021-06-29 NOTE — Progress Notes (Signed)
?Location:  [facility] ?  ?Place of Service:  SNF (31) ? ? ?CODE STATUS: Full Code  ? ?Allergies  ?Allergen Reactions  ? Gabapentin Other (See Comments)  ?  dizziness  ? Tetracyclines & Related Other (See Comments)  ?  Makes her sick on her stomach   ? ? ?Chief Complaint  ?Patient presents with  ? Hospitalization Follow-up  ?  Follow-up from recent hospital stay   ? ? ?HPI: ? ?She is a 71 year old woman who has been hospitalized from 06-22-21 through 06-28-21. Her medical history includes: alcohol abuse; tobacco abuse; afib hypertension. She presented to the ED after a syncopal episode that resulted in fall; suffering a right humeral fracture. Orthopedic surgery was consulted and underwent intramedullary nail fixation on 06-25-21. She has a history of drinking 4-6 beers per day per family. She is here for short term rehab with her goal to return back home. She does have right arm pain; which is in a sling. She will continue to be followed for her chronic illnesses including:  Atrial fibrillation with RVR: Aortic atherosclerosis   Essential hypertension:  Gastroesophageal reflux disease without esophagitis: ? ? ?Past Medical History:  ?Diagnosis Date  ? Depression   ? Difficult intubation   ? History of laryngospasm  ? GERD (gastroesophageal reflux disease)   ? Hyperlipidemia   ? Hypertension   ? Numbness and tingling   ? Pulmonary embolism ([place]) 1973  ? ? ?Past Surgical History:  ?Procedure Laterality Date  ? COLONOSCOPY WITH PROPOFOL N/A 04/07/2015  ? SLF: 1. seven colorectal polyps removed 2. the left colon is redundant 3. small internal hemorrhoids (1 simple adenoma, 6 hyperplastic)   ? CYST EXCISION Left   ? Arm  ? ESOPHAGOGASTRODUODENOSCOPY (EGD) WITH PROPOFOL N/A 04/07/2015  ? SLF: 1. Schatzki ring 2. Bravo capsule 34 cm from the teeth 3. moderate non-erosive gastritis.   ? HUMERUS IM NAIL Right 06/25/2021  ? Procedure: INTRAMEDULLARY (IM) NAIL HUMERAL;  Surgeon: Mordecai Rasmussen, MD;  [facility]: [facility];   Service: Orthopedics;  Laterality: Right;  ? MASS EXCISION N/A 2002  ? Lanyx  ? MASS EXCISION Left 01/21/2015  ? Procedure: EXCISION CYST LEFT UPPER ARM;  Surgeon: Aviva Signs, MD;  Location: [facility];  Service: General;  Laterality: Left;  ? MICROLARYNGOSCOPY N/A 05/31/2016  ? Procedure: MICRO LARYNGOSCOPY WITH BIOPSY OF VOCAL CORD LESION;  Surgeon: Leta Baptist, MD;  Location: [facility];  Service: ENT;  Laterality: N/A;  ? POLYPECTOMY N/A 04/07/2015  ? Procedure: POLYPECTOMY;  Surgeon: Danie Binder, MD;  [facility];  Service: Endoscopy;  Laterality: N/A;  Descending colon polyps x 3 ?  ? SAVORY DILATION N/A 04/07/2015  ? Procedure: SAVORY DILATION;  Surgeon: Danie Binder, MD;  [facility];  Service: Endoscopy;  Laterality: N/A;  ? ? ?Social History  ? ?Socioeconomic History  ? Marital status: Single  ?  Spouse name: Not on file  ? Number of children: 1  ? Years of education: some college  ? Highest education level: Not on file  ?Occupational History  ? Occupation: Retired  ?Tobacco Use  ? Smoking status: Every Day  ?  Packs/day: 1.50  ?  Years: 25.00  ?  Pack years: 37.50  ?  Types: Cigarettes  ? Smokeless tobacco: Never  ?Vaping Use  ? Vaping Use: Never used  ?Substance and Sexual Activity  ? Alcohol use: Yes  ?  Comment: Drinks two beer per  night.  ? Drug use: Yes  ?  Types: Marijuana  ?  Comment: daily marijuana use  ? Sexual activity: Not on file  ?Other Topics Concern  ? Not on file  ?Social History Narrative  ? Lives at home alone.  ? Right-handed.  ? Two cups sweet tea daily.  ? ?Social Determinants of Health  ? ?Financial Resource Strain: Not on file  ?Food Insecurity: Not on file  ?Transportation Needs: Not on file  ?Physical Activity: Not on file  ?Stress: Not on file  ?Social Connections: Not on file  ?Intimate Partner Violence: Not on file  ? ?Family History  ?Problem Relation Age of Onset  ? Pneumonia Mother   ? Aneurysm Father   ? Colon cancer Neg Hx    ? ? ? ? ?VITAL SIGNS ?BP 108/71   Pulse 80   Ht '5\' 6"'$  (1.676 m)   Wt 182 lb (82.6 kg)   LMP 06/20/1997 (Approximate) Comment: post menopausal  BMI 29.38 kg/m?  ? ?Outpatient Encounter Medications as of 06/29/2021  ?Medication Sig  ? amiodarone (PACERONE) 200 MG tablet Take 1 tablet (200 mg total) by mouth daily.  ? amLODipine (NORVASC) 10 MG tablet Take 10 mg by mouth daily.   ? DULoxetine (CYMBALTA) 60 MG capsule Take 60 mg by mouth daily.  ? folic acid (FOLVITE) 1 MG tablet Take 1 tablet (1 mg total) by mouth daily.  ? HYDROcodone-acetaminophen (NORCO) 10-325 MG tablet Take 0.5 tablets by mouth 3 (three) times daily as needed for up to 7 days.  ? losartan (COZAAR) 50 MG tablet Take 50 mg by mouth daily.  ? pantoprazole (PROTONIX) 40 MG tablet Take 40 mg by mouth daily.  ? UNABLE TO FIND Med Name: Regular and NAS  ? venlafaxine XR (EFFEXOR-XR) 150 MG 24 hr capsule Take 2 capsules by mouth every evening.  ? ?No facility-administered encounter medications on file as of 06/29/2021.  ? ? ? ?SIGNIFICANT DIAGNOSTIC EXAMS ? ?TODAY ? ?06-23-21: wbc 13.6; hgb 15.8; hct 46.1; mcv 96.2 plt 222; glucose 97; bun 17; creat 1.03; k+ 4.0; na++ 135; ca 9.2; GFR 58 ?06-25-21: wbc 9.7; hgb 14.7; hct 45.1; mcv 99.3 plt 197; glucose 99; bun 9; creat 0.51; k+ 3.2; na++ 136; ca 8.6; GFR>50 protein 6.2; albumin 3.1; mag 1.9 ?06-26-21: tsh 0.169 free t4: 1.31 ? ?Review of Systems  ?Constitutional:  Negative for malaise/fatigue.  ?Respiratory:  Negative for cough and shortness of breath.   ?Cardiovascular:  Negative for chest pain, palpitations and leg swelling.  ?Gastrointestinal:  Negative for abdominal pain, constipation and heartburn.  ?Musculoskeletal:  Positive for joint pain. Negative for back pain and myalgias.  ?     Right arm pain  ?Skin: Negative.   ?Neurological:  Negative for dizziness.  ?Psychiatric/Behavioral:  The patient is not nervous/anxious.   ? ?Physical Exam ?Constitutional:   ?   General: She is not in acute  distress. ?   Appearance: She is well-developed. She is not diaphoretic.  ?Neck:  ?   Thyroid: No thyromegaly.  ?Cardiovascular:  ?   Rate and Rhythm: Normal rate and regular rhythm.  ?   Pulses: Normal pulses.  ?   Heart sounds: Normal heart sounds.  ?Pulmonary:  ?   Effort: Pulmonary effort is normal. No respiratory distress.  ?   Breath sounds: Normal breath sounds.  ?Abdominal:  ?   General: Bowel sounds are normal. There is no distension.  ?   Palpations: Abdomen is soft.  ?  Tenderness: There is no abdominal tenderness.  ?Musculoskeletal:  ?   Cervical back: Neck supple.  ?   Right lower leg: No edema.  ?   Left lower leg: No edema.  ?   Comments: Right arm in sling   ?Lymphadenopathy:  ?   Cervical: No cervical adenopathy.  ?Skin: ?   General: Skin is warm and dry.  ?Neurological:  ?   Mental Status: She is alert and oriented to person, place, and time.  ?Psychiatric:     ?   Mood and Affect: Mood normal.  ? ? ? ?ASSESSMENT/ PLAN: ? ?TODAY ? ?Other closed fracture  of proximal end of right humerus sequela: is stable and is in sling. Will continue therapy as directed and will follow up with orthopedics. Has vicodin 10/325 mg 1/2 tab three times daily as needed; has been on this medication since 2017.  ? ?2. Atrial fibrillation with RVR: heart rate is stable; will continue amiodarone 200 mg daily for rate control ? ?3. Aortic atherosclerosis (ct 07-09-18)  ? ?4.  Essential hypertension: is stable b/p 108/71: will continue cozaar 50 mg daily norvasc 10 mg daily  ? ?5. Gastroesophageal reflux disease without esophagitis: will continue protonix 40 mg daily  ? ?6.  Polyneuropathy: will continue cymbalta 60 mg daily  ? ?7. Alcohol abuse: will stop thiamine per her request; will continue folic acid 1 mg daily  ? ?8. Hypoalbuminemia due to protein calorie malnutrition: albumin 3.1  ? ?9. Tobacco abuse: has 37.5 pack year history: has declined patches etc to help with withdrawal ? ?10. Depression, chronic: will continue  effexor xr 300 mg daily  ? ? ? ? ?Ok Edwards NP ?[facility]  ?call [phone]  ? ?

## 2021-06-30 ENCOUNTER — Other Ambulatory Visit: Payer: Self-pay | Admitting: Adult Health

## 2021-06-30 ENCOUNTER — Non-Acute Institutional Stay (SKILLED_NURSING_FACILITY): Payer: Medicare Other | Admitting: Adult Health

## 2021-06-30 ENCOUNTER — Encounter (HOSPITAL_COMMUNITY): Payer: Self-pay | Admitting: Orthopedic Surgery

## 2021-06-30 DIAGNOSIS — I4891 Unspecified atrial fibrillation: Secondary | ICD-10-CM

## 2021-06-30 DIAGNOSIS — S42291S Other displaced fracture of upper end of right humerus, sequela: Secondary | ICD-10-CM

## 2021-06-30 DIAGNOSIS — I700 Atherosclerosis of aorta: Secondary | ICD-10-CM | POA: Insufficient documentation

## 2021-06-30 DIAGNOSIS — Z720 Tobacco use: Secondary | ICD-10-CM | POA: Insufficient documentation

## 2021-06-30 MED ORDER — PANTOPRAZOLE SODIUM 40 MG PO TBEC: 40.0000 mg | DELAYED_RELEASE_TABLET | Freq: Every day | ORAL | 0 refills | Status: DC

## 2021-06-30 MED ORDER — LOSARTAN POTASSIUM 50 MG PO TABS: 50.0000 mg | ORAL_TABLET | Freq: Every day | ORAL | 0 refills | Status: DC

## 2021-06-30 MED ORDER — AMLODIPINE BESYLATE 10 MG PO TABS: 10.0000 mg | ORAL_TABLET | Freq: Every day | ORAL | 0 refills | Status: DC

## 2021-06-30 MED ORDER — HYDROCODONE-ACETAMINOPHEN 10-325 MG PO TABS
0.5000 | ORAL_TABLET | Freq: Three times a day (TID) | ORAL | 0 refills | Status: DC | PRN
Start: 2021-06-30 — End: 2022-11-22

## 2021-06-30 MED ORDER — DULOXETINE HCL 60 MG PO CPEP: 60.0000 mg | ORAL_CAPSULE | Freq: Every day | ORAL | 0 refills | Status: DC

## 2021-06-30 MED ORDER — FOLIC ACID 1 MG PO TABS: 1.0000 mg | ORAL_TABLET | Freq: Every day | ORAL | 0 refills | Status: DC

## 2021-06-30 MED ORDER — AMIODARONE HCL 200 MG PO TABS: 200.0000 mg | ORAL_TABLET | Freq: Every day | ORAL | 0 refills | Status: DC

## 2021-06-30 MED ORDER — VENLAFAXINE HCL ER 150 MG PO CP24: 300.0000 mg | ORAL_CAPSULE | Freq: Every evening | ORAL | 0 refills | Status: DC

## 2021-06-30 NOTE — Progress Notes (Signed)
? ?Location:  [facility] ?Nursing Home Room Number: A355/D ?Place of Service:  SNF (31) ? ?Provider: Ok Edwards np  ? ?PCP: Lemmie Evens, MD ?Patient Care Team: ?Lemmie Evens, MD as PCP - General (Family Medicine) ?Arnoldo Lenis, MD as PCP - Cardiology (Cardiology) ?Fields, Marga Melnick, MD (Inactive) as Consulting Physician (Gastroenterology) ?Eloise Harman, DO as Consulting Physician (Internal Medicine) ? ?Extended Emergency Contact Information ?Primary Emergency Contact: Ford,Kim ?         [place], [place] [address] [place] ?Home Phone: [phone] ?Relation: Daughter ?Secondary Emergency Contact: Ford,Mike ?         [place], [place] [address] [place] ?Home Phone: [phone] ?Mobile Phone: [phone] ?Relation: Son ? ?Code Status: full code  ?Goals of care:  Advanced Directive information ? ?  06/30/2021  ? 11:54 AM  ?Advanced Directives  ?Does Patient Have a Medical Advance Directive? No  ?Would patient like information on creating a medical advance directive? No - Patient declined  ? ? ? ?Allergies  ?Allergen Reactions  ? Gabapentin Other (See Comments)  ?  dizziness  ? Tetracyclines & Related Other (See Comments)  ?  Makes her sick on her stomach   ? ? ?Chief Complaint  ?Patient presents with  ? Discharge Note  ?  Patient is being discharged today  ? ? ?HPI:  ?71 y.o. female  being discharged to home with home health; for pt/ot/rn. She will not need any dme. She will need her prescriptions written and will need to follow up with her medical provider. She had a humerus IM nail done. She was admitted to this facility for short term rehab. She is quite angry that she cannot smoke in the building or on property. She has decided to return back home.  ? ? ? ?Past Medical History:  ?Diagnosis Date  ? Depression   ? Difficult intubation   ? History of laryngospasm  ? GERD (gastroesophageal reflux disease)   ? Hyperlipidemia   ? Hypertension   ? Numbness and tingling   ?  Pulmonary embolism ([place]) 1973  ? ? ?Past Surgical History:  ?Procedure Laterality Date  ? COLONOSCOPY WITH PROPOFOL N/A 04/07/2015  ? SLF: 1. seven colorectal polyps removed 2. the left colon is redundant 3. small internal hemorrhoids (1 simple adenoma, 6 hyperplastic)   ? CYST EXCISION Left   ? Arm  ? ESOPHAGOGASTRODUODENOSCOPY (EGD) WITH PROPOFOL N/A 04/07/2015  ? SLF: 1. Schatzki ring 2. Bravo capsule 34 cm from the teeth 3. moderate non-erosive gastritis.   ? HUMERUS IM NAIL Right 06/25/2021  ? Procedure: INTRAMEDULLARY (IM) NAIL HUMERAL;  Surgeon: Mordecai Rasmussen, MD;  Location: [facility];  Service: Orthopedics;  Laterality: Right;  ? MASS EXCISION N/A 2002  ? Lanyx  ? MASS EXCISION Left 01/21/2015  ? Procedure: EXCISION CYST LEFT UPPER ARM;  Surgeon: Aviva Signs, MD;  Location: [facility];  Service: General;  Laterality: Left;  ? MICROLARYNGOSCOPY N/A 05/31/2016  ? Procedure: MICRO LARYNGOSCOPY WITH BIOPSY OF VOCAL CORD LESION;  Surgeon: Leta Baptist, MD;  Location: [facility];  Service: ENT;  Laterality: N/A;  ? POLYPECTOMY N/A 04/07/2015  ? Procedure: POLYPECTOMY;  Surgeon: Danie Binder, MD;  [facility];  Service: Endoscopy;  Laterality: N/A;  Descending colon polyps x 3 ?  ? SAVORY DILATION N/A 04/07/2015  ? Procedure: SAVORY DILATION;  Surgeon: Danie Binder, MD;  [facility];  Service: Endoscopy;  Laterality: N/A;  ? ? ?  reports that she has been smoking cigarettes. She has a 37.50 pack-year smoking history. She has never used smokeless tobacco. She reports current alcohol use. She reports current drug use. Drug: Marijuana. ?Social History  ? ?Socioeconomic History  ? Marital status: Single  ?  Spouse name: Not on file  ? Number of children: 1  ? Years of education: some college  ? Highest education level: Not on file  ?Occupational History  ? Occupation: Retired  ?Tobacco Use  ? Smoking status: Every Day  ?  Packs/day: 1.50  ?  Years: 25.00  ?  Pack years: 37.50  ?  Types:  Cigarettes  ? Smokeless tobacco: Never  ?Vaping Use  ? Vaping Use: Never used  ?Substance and Sexual Activity  ? Alcohol use: Yes  ?  Comment: Drinks two beer per night.  ? Drug use: Yes  ?  Types: Marijuana  ?  Comment: daily marijuana use  ? Sexual activity: Not on file  ?Other Topics Concern  ? Not on file  ?Social History Narrative  ? Lives at home alone.  ? Right-handed.  ? Two cups sweet tea daily.  ? ?Social Determinants of Health  ? ?Financial Resource Strain: Not on file  ?Food Insecurity: Not on file  ?Transportation Needs: Not on file  ?Physical Activity: Not on file  ?Stress: Not on file  ?Social Connections: Not on file  ?Intimate Partner Violence: Not on file  ? ?Functional Status Survey: ?  ? ?Allergies  ?Allergen Reactions  ? Gabapentin Other (See Comments)  ?  dizziness  ? Tetracyclines & Related Other (See Comments)  ?  Makes her sick on her stomach   ? ? ?Pertinent  Health Maintenance Due  ?Topic Date Due  ? MAMMOGRAM  Never done  ? DEXA SCAN  Never done  ? INFLUENZA VACCINE  10/05/2021  ? COLONOSCOPY (Pts 45-64yr Insurance coverage will need to be confirmed)  04/06/2025  ? ? ?Medications: ?Outpatient Encounter Medications as of 06/30/2021  ?Medication Sig  ? amiodarone (PACERONE) 200 MG tablet Take 1 tablet (200 mg total) by mouth daily.  ? amLODipine (NORVASC) 10 MG tablet Take 1 tablet (10 mg total) by mouth daily.  ? DULoxetine (CYMBALTA) 60 MG capsule Take 1 capsule (60 mg total) by mouth daily.  ? folic acid (FOLVITE) 1 MG tablet Take 1 tablet (1 mg total) by mouth daily.  ? HYDROcodone-acetaminophen (NORCO) 10-325 MG tablet Take 0.5 tablets by mouth 3 (three) times daily as needed.  ? losartan (COZAAR) 50 MG tablet Take 1 tablet (50 mg total) by mouth daily.  ? pantoprazole (PROTONIX) 40 MG tablet Take 1 tablet (40 mg total) by mouth daily.  ? UNABLE TO FIND Med Name: Regular and NAS  ? venlafaxine XR (EFFEXOR-XR) 150 MG 24 hr capsule Take 2 capsules (300 mg total) by mouth every evening.   ? ?No facility-administered encounter medications on file as of 06/30/2021.  ? ? ?Vitals:  ? 06/30/21 1152  ?BP: 116/73  ?Pulse: 88  ?Resp: (!) 22  ?Temp: 97.6 ?F (36.4 ?C)  ?SpO2: 92%  ?Weight: 182 lb (82.6 kg)  ?Height: '5\' 6"'$  (1.676 m)  ? ?Body mass index is 29.38 kg/m?. ? ? ?SIGNIFICANT DIAGNOSTIC EXAMS ? ?TODAY ? ?06-23-21: wbc 13.6; hgb 15.8; hct 46.1; mcv 96.2 plt 222; glucose 97; bun 17; creat 1.03; k+ 4.0; na++ 135; ca 9.2; GFR 58 ?06-25-21: wbc 9.7; hgb 14.7; hct 45.1; mcv 99.3 plt 197; glucose 99; bun 9; creat 0.51; k+ 3.2;  na++ 136; ca 8.6; GFR>50 protein 6.2; albumin 3.1; mag 1.9 ?06-26-21: tsh 0.169 free t4: 1.31 ? ?Review of Systems  ?Constitutional:  Negative for malaise/fatigue.  ?Respiratory:  Negative for cough and shortness of breath.   ?Cardiovascular:  Negative for chest pain, palpitations and leg swelling.  ?Gastrointestinal:  Negative for abdominal pain, constipation and heartburn.  ?Musculoskeletal:  Positive for joint pain. Negative for back pain and myalgias.  ?Skin: Negative.   ?Neurological:  Negative for dizziness.  ?Psychiatric/Behavioral:  The patient is not nervous/anxious.   ? ?Physical Exam ?Constitutional:   ?   General: She is not in acute distress. ?   Appearance: She is well-developed. She is not diaphoretic.  ?Neck:  ?   Thyroid: No thyromegaly.  ?Cardiovascular:  ?   Rate and Rhythm: Normal rate and regular rhythm.  ?   Pulses: Normal pulses.  ?   Heart sounds: Normal heart sounds.  ?Pulmonary:  ?   Effort: Pulmonary effort is normal. No respiratory distress.  ?   Breath sounds: Normal breath sounds.  ?Abdominal:  ?   General: Bowel sounds are normal. There is no distension.  ?   Palpations: Abdomen is soft.  ?   Tenderness: There is no abdominal tenderness.  ?Musculoskeletal:  ?   Cervical back: Neck supple.  ?   Right lower leg: No edema.  ?   Left lower leg: No edema.  ?   Comments: Right arm in sling   ?Lymphadenopathy:  ?   Cervical: No cervical adenopathy.  ?Skin: ?    General: Skin is warm and dry.  ?Neurological:  ?   Mental Status: She is alert and oriented to person, place, and time.  ?Psychiatric:  ?   Comments: Is agitated   ? ? ? ?Assessment/Plan:   ? ? ?Patient is bei

## 2021-07-05 ENCOUNTER — Telehealth: Payer: Self-pay

## 2021-07-05 NOTE — Telephone Encounter (Signed)
Cassandra brown from [facility] called in stating that she is at the pt's house and the surgery site is bleeding, not bright red but it is a tan color. Pt states that she has only been straightening it and squeezing her hand and fist per surgeon. Pt is scheduled for f/u 5/5 and cassandra is wanting to know if they pt needs to be seen before then. ?

## 2021-07-05 NOTE — Telephone Encounter (Signed)
I called, discussed, bringing pt in tmrw to eval.  ? ?Patient also request a Rx for platform for her walker be sent to [facility].  ?

## 2021-07-06 ENCOUNTER — Ambulatory Visit (INDEPENDENT_AMBULATORY_CARE_PROVIDER_SITE_OTHER): Payer: Medicare Other

## 2021-07-06 ENCOUNTER — Encounter: Payer: Self-pay | Admitting: Orthopedic Surgery

## 2021-07-06 ENCOUNTER — Ambulatory Visit (INDEPENDENT_AMBULATORY_CARE_PROVIDER_SITE_OTHER): Payer: Medicare Other | Admitting: Orthopedic Surgery

## 2021-07-06 DIAGNOSIS — S42331D Displaced oblique fracture of shaft of humerus, right arm, subsequent encounter for fracture with routine healing: Secondary | ICD-10-CM

## 2021-07-06 DIAGNOSIS — G8929 Other chronic pain: Secondary | ICD-10-CM

## 2021-07-06 DIAGNOSIS — W19XXXA Unspecified fall, initial encounter: Secondary | ICD-10-CM

## 2021-07-06 DIAGNOSIS — Y92009 Unspecified place in unspecified non-institutional (private) residence as the place of occurrence of the external cause: Secondary | ICD-10-CM

## 2021-07-06 MED ORDER — CEPHALEXIN 500 MG PO CAPS: 500.0000 mg | ORAL_CAPSULE | Freq: Two times a day (BID) | ORAL | 0 refills | Status: AC

## 2021-07-06 NOTE — Progress Notes (Signed)
Orthopaedic Postop Note ? ?Assessment: ?Laura Mcgee is a 71 y.o. female s/p IM nail of right humerus shaft fracture ? ?DOS: 06/25/2021 ? ?Plan: ?Staples were removed, and Steri-Strips were placed. ?Serosanguineous drainage, consistent with hematoma. ?Soft dressing was placed.  I have started her on Keflex to minimize the risk for infection. ?Continue with limited use of the right upper extremity. ?Compression over the biceps, and the area of swelling. ?If drainage does not improve over the next 5-7 days, I have asked her to contact the clinic.  In this situation, I would likely place a wound VAC on the draining surgical incision. ? ?Meds ordered this encounter  ?Medications  ? cephALEXin (KEFLEX) 500 MG capsule  ?  Sig: Take 1 capsule (500 mg total) by mouth 2 (two) times daily for 10 days.  ?  Dispense:  20 capsule  ?  Refill:  0  ? ? ? ?Follow-up: ?Return in about 2 weeks (around 07/20/2021). ?XR at next visit: Right humerus ? ?Subjective: ? ?Chief Complaint  ?Patient presents with  ? Post-op Follow-up  ?  Recheck on right  humerus, DOS  06-25-21.  ? ? ?History of Present Illness: ?Laura Mcgee is a 71 y.o. female who presents following the above stated procedure.  Surgery was approximately 10 days ago.  She was initially discharged the [facility], but is now staying at home.  She has been followed by home health nursing.  Nursing evaluated her yesterday, and were concerned about drainage.  As a result, she returns to clinic earlier than previously expected.  Her pain is improving.  No fevers or chills.  No numbness or tingling.  She has not been moving her arm much.  She has been using a sling. ? ?Review of Systems: ?No fevers or chills ?No numbness or tingling ?No Chest Pain ?No shortness of breath ? ? ?Objective: ?LMP 06/20/1997 (Approximate) Comment: post menopausal ? ?Physical Exam: ? ?Alert and oriented.  No acute distress. ? ?Proximal right arm surgical incision is healing well.  No surrounding erythema  or drainage.  There is some swelling and residual bruising within the biceps.  There is drainage from the anterior bicep surgical incision, which is serosanguineous.  No purulence is appreciated.  She has intact sensation throughout the right hand.  Sensation is intact over the axillary nerve distribution. ? ?IMAGING: ?I personally ordered and reviewed the following images: ? ? ?X-rays of the right humerus were obtained in clinic today.  Near-anatomic alignment of the oblique fracture of the proximal one third humerus shaft.  There has been no interval displacement.  No callus formation is appreciated.  The distal interlocking screw has appeared to have backed out, but remains in acceptable position. ? ?Impression: Right humerus shaft fracture in good alignment, without hardware failure ? ? ?Mordecai Rasmussen, MD ?07/06/2021 ?10:42 PM ? ? ?

## 2021-07-06 NOTE — Patient Instructions (Signed)
Dressing on the arm as needed.  ACE wrap around the biceps for compression.  Limited motion of the shoulder.  ? ?Take antibiotics as prescribed.  Contact the clinic if you have issues.  ?

## 2021-07-09 ENCOUNTER — Encounter: Payer: Medicare Other | Admitting: Orthopedic Surgery

## 2021-07-20 ENCOUNTER — Encounter: Payer: Medicare Other | Admitting: Orthopedic Surgery

## 2021-07-23 ENCOUNTER — Ambulatory Visit (INDEPENDENT_AMBULATORY_CARE_PROVIDER_SITE_OTHER): Payer: Medicare Other

## 2021-07-23 ENCOUNTER — Ambulatory Visit (INDEPENDENT_AMBULATORY_CARE_PROVIDER_SITE_OTHER): Payer: Medicare Other | Admitting: Orthopedic Surgery

## 2021-07-23 ENCOUNTER — Encounter: Payer: Self-pay | Admitting: Orthopedic Surgery

## 2021-07-23 VITALS — Ht 66.0 in | Wt 182.0 lb

## 2021-07-23 DIAGNOSIS — S42291S Other displaced fracture of upper end of right humerus, sequela: Secondary | ICD-10-CM | POA: Diagnosis not present

## 2021-07-23 NOTE — Patient Instructions (Signed)
Okay to come out of the sling.  Recommend you wear the sling when out of the house, or up moving around.  Continue to work on range of motion of your elbow, wrist and fingers.  Initiate pendulum exercises immediately.  In 2 weeks, you can start working on passive range of motion, using the left arm to gently stretch the right arm.  Follow-up in 4 weeks    Pendulum   Stand near a wall or a surface that you can hold onto for balance. Bend at the waist and let your left / right arm hang straight down. Use your other arm to support you. Keep your back straight and do not lock your knees. Relax your left / right arm and shoulder muscles, and move your hips and your trunk so your left / right arm swings freely. Your arm should swing because of the motion of your body, not because you are using your arm or shoulder muscles. Keep moving your hips and trunk so your arm swings in the following directions, as told by your health care provider: Side to side. Forward and backward. In clockwise and counterclockwise circles. Continue each motion for 20 seconds, or for as long as told by your health care provider. Slowly return to the starting position.  Repeat 10 times. Complete this exercise daily.

## 2021-07-24 ENCOUNTER — Encounter: Payer: Self-pay | Admitting: Orthopedic Surgery

## 2021-07-24 NOTE — Progress Notes (Signed)
Orthopaedic Postop Note  Assessment: Laura Mcgee is a 71 y.o. female s/p IM nail of right humerus shaft fracture  DOS: 06/25/2021  Plan: Radiographs stable.  Distal interlocking screw remains unchanged, with some identified backing out.  Pain is improving.  She is using her arm more.  At this point, I would like for her to continue to work on range of motion of her elbow, wrist and fingers.  She can initiate pendulum exercises for the next 2 weeks.  After that, she can gently start with passive range of motion.  I will see her back in 4 weeks.  Continue to wear the sling when out of the house.  Okay to remove the sling when at home.    Follow-up: Return in about 4 weeks (around 08/20/2021). XR at next visit: Right humerus  Subjective:  Chief Complaint  Patient presents with   Routine Post Op    Rt humeral DOS 06/25/21    History of Present Illness: Laura Mcgee is a 71 y.o. female who presents following the above stated procedure.  Surgery was approximately 1 month ago.  At the last clinic visit, she had some drainage over the anterior arm incision.  She completed her antibiotics.  The drainage has improved.  Swelling has improved.  She is able to use her arm below the level of the shoulder more.  She is not lifting with this arm.  She continues to wear the sling.  review of Systems: No fevers or chills No numbness or tingling No Chest Pain No shortness of breath   Objective: Ht '5\' 6"'$  (1.676 m)   Wt 182 lb (82.6 kg)   LMP 06/20/1997 (Approximate) Comment: post menopausal  BMI 29.38 kg/m   Physical Exam:  Alert and oriented.  No acute distress.  Surgical incisions are healing well.  No surrounding erythema or drainage.  Swelling over the anterior upper arm has significantly improved.  Mild irritation in the distal, medial upper arm.  Sensation is intact throughout the right hand.  2+ radial pulse.  IMAGING: I personally ordered and reviewed the following  images:   X-rays of the right humerus were obtained in clinic today.  The oblique fracture of the proximal shaft of the right humerus remains visible, without change in alignment.  Hardware remains in good position.  No evidence of failure or subsidence.  Distal interlocking screw has backed out, but this is unchanged compared to most recent x-ray.  Impression: Healing right humeral shaft fracture, without hardware failure.   Mordecai Rasmussen, MD 07/24/2021 12:11 AM

## 2021-08-05 ENCOUNTER — Encounter: Payer: Self-pay | Admitting: Internal Medicine

## 2021-08-27 ENCOUNTER — Ambulatory Visit (INDEPENDENT_AMBULATORY_CARE_PROVIDER_SITE_OTHER): Payer: Medicare Other | Admitting: Orthopedic Surgery

## 2021-08-27 ENCOUNTER — Encounter: Payer: Self-pay | Admitting: Orthopedic Surgery

## 2021-08-27 ENCOUNTER — Ambulatory Visit (INDEPENDENT_AMBULATORY_CARE_PROVIDER_SITE_OTHER): Payer: Medicare Other

## 2021-08-27 DIAGNOSIS — S42291S Other displaced fracture of upper end of right humerus, sequela: Secondary | ICD-10-CM

## 2021-08-29 ENCOUNTER — Encounter: Payer: Self-pay | Admitting: Gastroenterology

## 2021-08-29 NOTE — Progress Notes (Deleted)
Referring Provider: Lemmie Evens, MD Primary Care Physician:  Lemmie Evens, MD Primary GI Physician: Dr. Abbey Chatters  No chief complaint on file.   HPI:   Laura Mcgee is a 71 y.o. female with GI history of GERD, constipation, adenomatous colon polyp and multiple hyperplastic polyps.  In 2017 with recommendations to repeat colonoscopy in 5 years, presenting today at the request of Dr. Karie Kirks for constipation, abdominal pain, nausea. ***  She was last seen in our office in September 2020 for alternating constipation and diarrhea and right-sided abdominal pain.  She reported 1 year of alternating bowel habits with stool consistency ranging from [place] 1 to [place] 7.  Often with pressure to have a bowel movement but does not necessarily go.  Symptoms passing stool when just feeling like she has to pass gas.  Typically with no more than a couple bowel movements a day.  Intermittent pain in right rib area, sometimes dull or sharp.  Lasts for minutes at a time and can go a week or more without symptoms.  Had a similar result of rectal bleeding several months ago.  Patient hoping to avoid colonoscopy.  She had a CT in May 2020 with no significant GI abnormalities.  Recommended adding fiber to regulate bowels, low-dose Bentyl as needed for abdominal pain and frequent loose stools, screen for celiac disease, thyroid dysfunction, anemia.  Patient's labs did not reveal any anemia.  Celiac screen was negative.  CMP and lipase within normal limits.  TSH level normal.  Today:    Past Medical History:  Diagnosis Date   Depression    Difficult intubation    History of laryngospasm   GERD (gastroesophageal reflux disease)    Hyperlipidemia    Hypertension    Numbness and tingling    Pulmonary embolism ([place]) 1973    Past Surgical History:  Procedure Laterality Date   COLONOSCOPY WITH PROPOFOL N/A 04/07/2015   SLF: 1. seven colorectal polyps removed 2. the left colon is redundant 3. small  internal hemorrhoids (1 simple adenoma, 6 hyperplastic) . Repeat in 5 years.   CYST EXCISION Left    Arm   ESOPHAGOGASTRODUODENOSCOPY (EGD) WITH PROPOFOL N/A 04/07/2015   SLF: 1. Schatzki ring 2. Bravo capsule 34 cm from the teeth 3. moderate non-erosive gastritis.    HUMERUS IM NAIL Right 06/25/2021   Procedure: INTRAMEDULLARY (IM) NAIL HUMERAL;  Surgeon: Mordecai Rasmussen, MD;  Location: [facility];  Service: Orthopedics;  Laterality: Right;   MASS EXCISION N/A 2002   Lanyx   MASS EXCISION Left 01/21/2015   Procedure: EXCISION CYST LEFT UPPER ARM;  Surgeon: Aviva Signs, MD;  Location: [facility];  Service: General;  Laterality: Left;   MICROLARYNGOSCOPY N/A 05/31/2016   Procedure: MICRO LARYNGOSCOPY WITH BIOPSY OF VOCAL CORD LESION;  Surgeon: Leta Baptist, MD;  Location: [facility];  Service: ENT;  Laterality: N/A;   POLYPECTOMY N/A 04/07/2015   Procedure: POLYPECTOMY;  Surgeon: Danie Binder, MD;  [facility];  Service: Endoscopy;  Laterality: N/A;  Descending colon polyps x 3    SAVORY DILATION N/A 04/07/2015   Procedure: SAVORY DILATION;  Surgeon: Danie Binder, MD;  [facility];  Service: Endoscopy;  Laterality: N/A;    Current Outpatient Medications  Medication Sig Dispense Refill   amiodarone (PACERONE) 200 MG tablet Take 1 tablet (200 mg total) by mouth daily. 30 tablet 0   amLODipine (NORVASC) 10 MG tablet Take 1 tablet (10 mg total) by mouth  daily. 30 tablet 0   DULoxetine (CYMBALTA) 60 MG capsule Take 1 capsule (60 mg total) by mouth daily. 30 capsule 0   folic acid (FOLVITE) 1 MG tablet Take 1 tablet (1 mg total) by mouth daily. 30 tablet 0   HYDROcodone-acetaminophen (NORCO) 10-325 MG tablet Take 0.5 tablets by mouth 3 (three) times daily as needed. 10 tablet 0   losartan (COZAAR) 50 MG tablet Take 1 tablet (50 mg total) by mouth daily. 30 tablet 0   pantoprazole (PROTONIX) 40 MG tablet Take 1 tablet (40 mg total) by mouth daily. 30 tablet 0    UNABLE TO FIND Med Name: Regular and NAS     venlafaxine XR (EFFEXOR-XR) 150 MG 24 hr capsule Take 2 capsules (300 mg total) by mouth every evening. 60 capsule 0   No current facility-administered medications for this visit.    Allergies as of 08/30/2021 - Review Complete 08/27/2021  Allergen Reaction Noted   Gabapentin Other (See Comments) 05/22/2019   Tetracyclines & related Other (See Comments) 01/15/2015    Family History  Problem Relation Age of Onset   Pneumonia Mother    Aneurysm Father    Colon cancer Neg Hx     Social History   Socioeconomic History   Marital status: Single    Spouse name: Not on file   Number of children: 1   Years of education: some college   Highest education level: Not on file  Occupational History   Occupation: Retired  Tobacco Use   Smoking status: Every Day    Packs/day: 1.50    Years: 25.00    Total pack years: 37.50    Types: Cigarettes   Smokeless tobacco: Never  Vaping Use   Vaping Use: Never used  Substance and Sexual Activity   Alcohol use: Yes    Comment: Drinks two beer per night.   Drug use: Yes    Types: Marijuana    Comment: daily marijuana use   Sexual activity: Not on file  Other Topics Concern   Not on file  Social History Narrative   Lives at home alone.   Right-handed.   Two cups sweet tea daily.   Social Determinants of Health   Financial Resource Strain: Not on file  Food Insecurity: Not on file  Transportation Needs: Not on file  Physical Activity: Not on file  Stress: Not on file  Social Connections: Not on file    Review of Systems: Gen: Denies fever, chills, cold or flulike symptoms, presyncope, syncope. CV: Denies chest pain, palpitations. Resp: Denies dyspnea, cough. GI: Denies vomiting blood, jaundice, and fecal incontinence.   Denies dysphagia or odynophagia. Heme: See HPI  Physical Exam: LMP 06/20/1997 (Approximate) Comment: post menopausal General:   Alert and oriented. No distress  noted. Pleasant and cooperative.  Head:  Normocephalic and atraumatic. Eyes:  Conjuctiva clear without scleral icterus. Heart:  S1, S2 present without murmurs appreciated. Lungs:  Clear to auscultation bilaterally. No wheezes, rales, or rhonchi. No distress.  Abdomen:  +BS, soft, non-tender and non-distended. No rebound or guarding. No HSM or masses noted. Msk:  Symmetrical without gross deformities. Normal posture. Extremities:  Without edema. Neurologic:  Alert and  oriented x4 Psych:  Normal mood and affect.    Assessment:     Plan:  ***   Aliene Altes, PA-C [facility] 08/30/2021

## 2021-08-30 ENCOUNTER — Ambulatory Visit: Payer: Medicare Other | Admitting: Gastroenterology

## 2021-09-13 ENCOUNTER — Ambulatory Visit: Payer: Medicare Other | Admitting: Cardiology

## 2021-09-16 ENCOUNTER — Encounter: Payer: Self-pay | Admitting: Cardiology

## 2021-10-14 ENCOUNTER — Ambulatory Visit: Payer: Medicare Other | Admitting: Internal Medicine

## 2021-10-21 ENCOUNTER — Ambulatory Visit: Payer: Medicare Other | Admitting: Cardiology

## 2021-10-21 NOTE — Progress Notes (Deleted)
Clinical Summary Laura Mcgee is a 71 y.o.female   1.Syncope? - admit 06/2021 after fall, hip fracutre.  - from hospital notes thought to be orthostatic hypotension complicated by EtOH abuse.       2.Afib? -noted during 06/2021 with hip fx in postop period - was not controlled on IV dilt, started on IV amio -appears was nto commited to anticoag     1. Palpitations - episodes of feeling lightheaded like she may pass out, then gets headache. Then gets palpitations.  - typically occurs at rest.  - lasts a few minutes - sporadically occur, last episode about 1 month ago   - drinks tea glass x several glasses. No sodas, no energy drinks. Drinks 4 beers per day. Takes bc powders x 2 per day.  - 06/2018 labs K 4.3      3. EtOH Past Medical History:  Diagnosis Date   Depression    Difficult intubation    History of laryngospasm   GERD (gastroesophageal reflux disease)    Hyperlipidemia    Hypertension    Numbness and tingling    Pulmonary embolism ([facility]) 1973     Allergies  Allergen Reactions   Gabapentin Other (See Comments)    dizziness   Tetracyclines & Related Other (See Comments)    Makes her sick on her stomach      Current Outpatient Medications  Medication Sig Dispense Refill   amiodarone (PACERONE) 200 MG tablet Take 1 tablet (200 mg total) by mouth daily. 30 tablet 0   amLODipine (NORVASC) 10 MG tablet Take 1 tablet (10 mg total) by mouth daily. 30 tablet 0   DULoxetine (CYMBALTA) 60 MG capsule Take 1 capsule (60 mg total) by mouth daily. 30 capsule 0   folic acid (FOLVITE) 1 MG tablet Take 1 tablet (1 mg total) by mouth daily. 30 tablet 0   HYDROcodone-acetaminophen (NORCO) 10-325 MG tablet Take 0.5 tablets by mouth 3 (three) times daily as needed. 10 tablet 0   losartan (COZAAR) 50 MG tablet Take 1 tablet (50 mg total) by mouth daily. 30 tablet 0   pantoprazole (PROTONIX) 40 MG tablet Take 1 tablet (40 mg total) by mouth daily. 30 tablet 0   UNABLE  TO FIND Med Name: Regular and NAS     venlafaxine XR (EFFEXOR-XR) 150 MG 24 hr capsule Take 2 capsules (300 mg total) by mouth every evening. 60 capsule 0   No current facility-administered medications for this visit.     Past Surgical History:  Procedure Laterality Date   COLONOSCOPY WITH PROPOFOL N/A 04/07/2015   SLF: 1. seven colorectal polyps removed 2. the left colon is redundant 3. small internal hemorrhoids (1 simple adenoma, 6 hyperplastic) . Repeat in 5 years.   CYST EXCISION Left    Arm   ESOPHAGOGASTRODUODENOSCOPY (EGD) WITH PROPOFOL N/A 04/07/2015   SLF: 1. Schatzki ring 2. Bravo capsule 34 cm from the teeth 3. moderate non-erosive gastritis.    HUMERUS IM NAIL Right 06/25/2021   Procedure: INTRAMEDULLARY (IM) NAIL HUMERAL;  Surgeon: Mordecai Rasmussen, MD;  Location: [facility];  Service: Orthopedics;  Laterality: Right;   MASS EXCISION N/A 2002   Lanyx   MASS EXCISION Left 01/21/2015   Procedure: EXCISION CYST LEFT UPPER ARM;  Surgeon: Aviva Signs, MD;  Location: [facility];  Service: General;  Laterality: Left;   MICROLARYNGOSCOPY N/A 05/31/2016   Procedure: MICRO LARYNGOSCOPY WITH BIOPSY OF VOCAL CORD LESION;  Surgeon: Leta Baptist, MD;  Location: [facility];  Service: ENT;  Laterality: N/A;   POLYPECTOMY N/A 04/07/2015   Procedure: POLYPECTOMY;  Surgeon: Danie Binder, MD;  [facility];  Service: Endoscopy;  Laterality: N/A;  Descending colon polyps x 3    SAVORY DILATION N/A 04/07/2015   Procedure: SAVORY DILATION;  Surgeon: Danie Binder, MD;  [facility];  Service: Endoscopy;  Laterality: N/A;     Allergies  Allergen Reactions   Gabapentin Other (See Comments)    dizziness   Tetracyclines & Related Other (See Comments)    Makes her sick on her stomach       Family History  Problem Relation Age of Onset   Pneumonia Mother    Aneurysm Father    Colon cancer Neg Hx      Social History Ms. Coppernoll reports that she has been  smoking cigarettes. She has a 37.50 pack-year smoking history. She has never used smokeless tobacco. Ms. Deckman reports current alcohol use.   Review of Systems CONSTITUTIONAL: No weight loss, fever, chills, weakness or fatigue.  HEENT: Eyes: No visual loss, blurred vision, double vision or yellow sclerae.No hearing loss, sneezing, congestion, runny nose or sore throat.  SKIN: No rash or itching.  CARDIOVASCULAR:  RESPIRATORY: No shortness of breath, cough or sputum.  GASTROINTESTINAL: No anorexia, nausea, vomiting or diarrhea. No abdominal pain or blood.  GENITOURINARY: No burning on urination, no polyuria NEUROLOGICAL: No headache, dizziness, syncope, paralysis, ataxia, numbness or tingling in the extremities. No change in bowel or bladder control.  MUSCULOSKELETAL: No muscle, back pain, joint pain or stiffness.  LYMPHATICS: No enlarged nodes. No history of splenectomy.  PSYCHIATRIC: No history of depression or anxiety.  ENDOCRINOLOGIC: No reports of sweating, cold or heat intolerance. No polyuria or polydipsia.  Marland Kitchen   Physical Examination There were no vitals filed for this visit. There were no vitals filed for this visit.  Gen: resting comfortably, no acute distress HEENT: no scleral icterus, pupils equal round and reactive, no palptable cervical adenopathy,  CV Resp: Clear to auscultation bilaterally GI: abdomen is soft, non-tender, non-distended, normal bowel sounds, no hepatosplenomegaly MSK: extremities are warm, no edema.  Skin: warm, no rash Neuro:  no focal deficits Psych: appropriate affect   Diagnostic Studies  06/2021 echo IMPRESSIONS     1. Left ventricular ejection fraction, by estimation, is 60 to 65%. The  left ventricle has normal function. The left ventricle has no regional  wall motion abnormalities. Left ventricular diastolic parameters are  consistent with Grade I diastolic  dysfunction (impaired relaxation).   2. Right ventricular systolic function is  normal. The right ventricular  size is normal. Tricuspid regurgitation signal is inadequate for assessing  PA pressure.   3. The mitral valve was not well visualized. No evidence of mitral valve  regurgitation. No evidence of mitral stenosis.   4. The aortic valve was not well visualized. Aortic valve regurgitation  is not visualized. No aortic stenosis is present.   5. The inferior vena cava is normal in size with greater than 50%  respiratory variability, suggesting right atrial pressure of 3 mmHg.   06/2021 carotid US IMPRESSION: 1. Right carotid artery system: Less than 50% stenosis secondary to mild multifocal atherosclerotic plaque formation.   2. Left carotid artery system: Less than 50% stenosis secondary to moderate multifocal atherosclerotic plaque formation, most prominent about the carotid bulb.   3.  Vertebral artery system: Patent with antegrade flow bilaterally.   Assessment and Plan  1. Palpitations - odd contellation of dizziness, headache and palpitations episodes - we will obtain a 21 day event monitor to evaluate for any undelrying arrhythmia -relatively high caffeine and EtoH intake, cousneled on cutting back to help with symptoms - EKG today shows NSR     Arnoldo Lenis, M.D., F.A.C.C.

## 2021-10-29 ENCOUNTER — Ambulatory Visit (INDEPENDENT_AMBULATORY_CARE_PROVIDER_SITE_OTHER): Payer: Medicare Other

## 2021-10-29 ENCOUNTER — Encounter: Payer: Self-pay | Admitting: Orthopedic Surgery

## 2021-10-29 ENCOUNTER — Ambulatory Visit (INDEPENDENT_AMBULATORY_CARE_PROVIDER_SITE_OTHER): Payer: Medicare Other | Admitting: Orthopedic Surgery

## 2021-10-29 VITALS — Ht 66.0 in | Wt 182.0 lb

## 2021-10-29 DIAGNOSIS — R2681 Unsteadiness on feet: Secondary | ICD-10-CM

## 2021-10-29 DIAGNOSIS — S42331D Displaced oblique fracture of shaft of humerus, right arm, subsequent encounter for fracture with routine healing: Secondary | ICD-10-CM | POA: Diagnosis not present

## 2021-10-29 DIAGNOSIS — M25511 Pain in right shoulder: Secondary | ICD-10-CM | POA: Diagnosis not present

## 2021-10-29 MED ORDER — METHYLPREDNISOLONE ACETATE 40 MG/ML IJ SUSP
40.0000 mg | Freq: Once | INTRAMUSCULAR | Status: AC
  Administered 2021-10-29: 40 mg via INTRA_ARTICULAR

## 2021-10-29 NOTE — Progress Notes (Signed)
Orthopaedic Postop Note  Assessment: Laura Mcgee is a 71 y.o. female s/p IM nail of right humerus shaft fracture  DOS: 06/25/2021  Plan: Mrs. Marmo is doing okay at this point.  Radiographs of the right humerus demonstrates maintenance of alignment, with callus formation.  The pain that she is reporting, is primarily coming from the shoulder.  She does have a history of right shoulder pain.  She is starting to use her arm more, but is still reluctant to lift heavier objects.  After discussing all this with the patient, I recommended a steroid injection.  This was completed in clinic today.  In addition, she is requesting a cane, and due to history of falls, and overall gait instability.  A prescription was provided.  I also provided her with home exercise program for her right shoulder.  She will let us know how she is doing.  Follow-up as needed.  Procedure note injection - Right shoulder    Verbal consent was obtained to inject the right shoulder, subacromial space Timeout was completed to confirm the site of injection.   The skin was prepped with alcohol and ethyl chloride was sprayed at the injection site.  A 21-gauge needle was used to inject 40 mg of Depo-Medrol and 1% lidocaine (3 cc) into the subacromial space of the right shoulder using a posterolateral approach.  There were no complications.  A sterile bandage was applied.    Follow-up: Return if symptoms worsen or fail to improve. XR at next visit: Right humerus  Subjective:  Chief Complaint  Patient presents with   Routine Post Op    Rt humerus DOS 06/25/21    History of Present Illness: Laura Mcgee is a 71 y.o. female who presents following the above stated procedure.  Surgery was approximately 4 months ago.  She continues to have some pain in her right arm.  It radiates distally.  Pain starts in the front of the shoulder, and radiates towards her elbow.  She is using the arm, but is still unable to lift heavy  objects.  She is not working with physical therapy, but is doing some exercises at home.  She also reports that she falls on a regular basis, and is requesting a cane.   review of Systems: No fevers or chills No numbness or tingling No Chest Pain No shortness of breath   Objective: Ht '5\' 6"'$  (1.676 m)   Wt 182 lb (82.6 kg)   LMP 06/20/1997 (Approximate) Comment: post menopausal  BMI 29.38 kg/m   Physical Exam:  Alert and oriented.  No acute distress.  Surgical incisions of healed well.  No surrounding erythema or drainage.  There is some dimpling of the skin of the proximal extent of the mid arm incision.  She has some focal tenderness to palpation over the anterior medial aspect of the distal humerus.  No overlying skin issues here.  She has tenderness to palpation over the anterior shoulder.  She has difficulty reaching her arm behind her back.  Pain with flexion beyond 120 degrees of forward flexion.  Fingers are warm and well-perfused.  No focal tenderness about the fracture site in the mid humerus.   IMAGING: I personally ordered and reviewed the following images:  X-ray of the right humerus was obtained in clinic today.  No acute injuries are noted.  The humeral nail remains in stable position.  No change in the overall alignment.  The distal interlocking screw remains in a stable position, without further backing  out.  There is robust callus formation about the fracture site.  No evidence of proximal humeral migration.  Glenohumeral joint space is maintained.  Impression: Healed right humeral shaft fracture without hardware failure or loosening.   Mordecai Rasmussen, MD 10/29/2021 11:43 AM

## 2021-10-29 NOTE — Patient Instructions (Signed)

## 2021-11-11 ENCOUNTER — Ambulatory Visit: Payer: Medicare Other | Admitting: Internal Medicine

## 2021-11-29 ENCOUNTER — Telehealth: Payer: Self-pay | Admitting: Orthopedic Surgery

## 2021-11-29 NOTE — Telephone Encounter (Signed)
Call from patient, states she had a fall on Friday, 922/23, in the shower, and has not been anywhere to have it checked out. States it is the same arm that she had fractured the shoulder on recently. States she was not taking a shower, but was changing the shower head, and 'went down onto the shower chair'.  Okay to schedule in office or recommend urgent care or emergency room for work up first? - please advise

## 2021-12-01 NOTE — Telephone Encounter (Signed)
Called patient to notify

## 2021-12-03 ENCOUNTER — Encounter: Payer: Self-pay | Admitting: Orthopedic Surgery

## 2021-12-03 ENCOUNTER — Ambulatory Visit (INDEPENDENT_AMBULATORY_CARE_PROVIDER_SITE_OTHER): Payer: Medicare Other

## 2021-12-03 ENCOUNTER — Ambulatory Visit (INDEPENDENT_AMBULATORY_CARE_PROVIDER_SITE_OTHER): Payer: Medicare Other | Admitting: Orthopedic Surgery

## 2021-12-03 ENCOUNTER — Ambulatory Visit: Payer: Medicare Other | Admitting: Orthopedic Surgery

## 2021-12-03 VITALS — Ht 66.0 in | Wt 182.0 lb

## 2021-12-03 DIAGNOSIS — W19XXXA Unspecified fall, initial encounter: Secondary | ICD-10-CM

## 2021-12-03 DIAGNOSIS — M25511 Pain in right shoulder: Secondary | ICD-10-CM | POA: Diagnosis not present

## 2021-12-03 DIAGNOSIS — S42331D Displaced oblique fracture of shaft of humerus, right arm, subsequent encounter for fracture with routine healing: Secondary | ICD-10-CM

## 2021-12-03 DIAGNOSIS — Y92009 Unspecified place in unspecified non-institutional (private) residence as the place of occurrence of the external cause: Secondary | ICD-10-CM

## 2021-12-05 ENCOUNTER — Encounter: Payer: Self-pay | Admitting: Orthopedic Surgery

## 2021-12-05 NOTE — Progress Notes (Signed)
Orthopaedic Postop Note  Assessment: Laura Mcgee is a 71 y.o. female s/p IM nail of right humerus shaft fracture  DOS: 06/25/2021  Plan: [name] continues to have right shoulder pain.  She sustained a fall recently, which worsened her pain.  Radiographs do not demonstrate an acute injury.  Prior injection helped for only a short period of time.  She has crepitus on physical exam on ROM testing and a lot of pain over the anterior shoulder, especially at the most superior screw.  Shoulder XR show glenohumeral arthritis, and she may have sustained damage to her rotator cuff when she sustained her initial injury.  Given the amount of pain she has over the superior screw, we discussed removal of the screw and assessment or exchange of the distal interlocking screw.  She is interested, but would prefer to wait.  She will contact me in the coming weeks if she wishes to proceed.  She is concerned about her balance and would like to wait if possible. We can also consider a glenohumeral joint injection.  Follow up as needed.     Follow-up: Return if symptoms worsen or fail to improve. XR at next visit: TBD  Subjective:  Chief Complaint  Patient presents with   Shoulder Pain    Rt shoulder pain after a fall possibly over the weekend.     History of Present Illness: Laura Mcgee is a 71 y.o. female who presents following the above stated procedure.  Surgery was approximately 5 months ago.  She states that she lost her balance a few days ago and fell.  The fall impacted the back of her shoulder.  She had worsening pain, but has yet to seek treatment.  At her last visit, we injected her right shoulder which improved her pain for a short period of time, and then made her arm ache.  She denies pain at the fracture site.  No numbness or tingling distally.    review of Systems: No fevers or chills No numbness or tingling No Chest Pain No shortness of breath   Objective: Ht '5\' 6"'$  (1.676 m)    Wt 182 lb (82.6 kg)   LMP 06/20/1997 (Approximate) Comment: post menopausal  BMI 29.38 kg/m   Physical Exam:  Alert and oriented.  No acute distress.  Well healed surgical incisions.  Tenderness over the anterior shoulder, especially the most superior screw.  She has a lot of pain and crepitus with ROM testing.  Difficulty reaching behind her back.  Sensation intact in the axillary nerve distribution.  2+ radial pulse.  Sensation intact distally.    IMAGING: I personally ordered and reviewed the following images:  XR of the right humerus was obtained in clinic today.  These were compared to prior XR.  No acute injuries are noted.  Screws remain in stable position.  Callus formation around the fracture site.  Fracture is healed.  Shoulder is reduced.    Impression: healed right humeral shaft fracture  XR of the right shoulder was obtained in clinic today.  No acute injuries are noted.  Inferior humeral head and glenoid osteophytes are noted.  Loss of glenohumeral joint space.  No proximal humeral migration.    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    Mordecai Rasmussen, MD 12/05/2021 9:39 PM

## 2021-12-06 ENCOUNTER — Telehealth: Payer: Self-pay | Admitting: Orthopedic Surgery

## 2021-12-06 NOTE — Telephone Encounter (Signed)
Patient called and wants to let us know shehas decided on a date for surgery on her right arm.   She wants to do it October 30 and she needs some one to call her back with a time to be there   Her number is [phone]

## 2021-12-07 NOTE — Telephone Encounter (Signed)
LVM and let pt know we received her message and we will proceed with getting her surgery for hardware removal on 10/30.

## 2021-12-27 ENCOUNTER — Ambulatory Visit: Payer: Self-pay | Admitting: Orthopedic Surgery

## 2021-12-27 DIAGNOSIS — Z01818 Encounter for other preprocedural examination: Secondary | ICD-10-CM

## 2021-12-27 DIAGNOSIS — S42331D Displaced oblique fracture of shaft of humerus, right arm, subsequent encounter for fracture with routine healing: Secondary | ICD-10-CM | POA: Insufficient documentation

## 2021-12-28 ENCOUNTER — Telehealth: Payer: Self-pay | Admitting: Orthopedic Surgery

## 2021-12-28 NOTE — Patient Instructions (Signed)
Laura Mcgee  12/28/2021     '@[email or web address]'$ @   Your procedure is scheduled on  01/03/2022.   Report to [facility] at  0600  A.M.   Call this number if you have problems the morning of surgery:  [phone]  If you experience any cold or flu symptoms such as cough, fever, chills, shortness of breath, etc. between now and your scheduled surgery, please notify us at the above number.   Remember:  Do not eat after midnight.   You may drink clear liquids until  0330 am on 01/03/2022.        At 0330 am on 01/03/2022 drink your carb drink. You can have nothing else to drink after this.     Take these medicines the morning of surgery with A SIP OF WATER              amlodipine, baclofen (if needed), duloxetine, hydrocodone(if needed), protonix, venlafaxine.     Do not wear jewelry, make-up or nail polish.  Do not wear lotions, powders, or perfumes, or deodorant.  Do not shave 48 hours prior to surgery.  Men may shave face and neck.  Do not bring valuables to the hospital.  [facility] is not responsible for any belongings or valuables.  Contacts, dentures or bridgework may not be worn into surgery.  Leave your suitcase in the car.  After surgery it may be brought to your room.  For patients admitted to the hospital, discharge time will be determined by your treatment team.  Patients discharged the day of surgery will not be allowed to drive home and must have someone with them for 24 hours.    Special instructions:           DO NOT smoke tobacco or vape for 24 hours before your procedure.   Please read over the following fact sheets that you were given. Coughing and Deep Breathing, Surgical Site Infection Prevention, Anesthesia Post-op Instructions, and Care and Recovery After Surgery      Orthopedic Hardware Removal, Care After This sheet gives you information about how to care for yourself after your procedure. Your health care provider may also  give you more specific instructions. If you have problems or questions, contact your health care provider. What can I expect after the procedure? After the procedure, it is common to have: Soreness or pain. Some swelling in the area where the hardware was removed. A small amount of blood or clear fluid coming from your incision. Follow these instructions at home: If you have a cast: Do not stick anything inside the cast to scratch your skin. Doing that increases your risk of infection. Check the skin around the cast every day. Tell your health care provider about any concerns. You may put lotion on dry skin around the edges of the cast. Do not put lotion on the skin underneath the cast. Keep the cast clean and dry. If you have a splint or boot: Wear the splint or boot as told by your health care provider. Remove it only as told by your health care provider. Loosen the splint or boot if your fingers or toes tingle, become numb, or turn cold and blue. Keep the splint or boot clean and dry. Bathing Do not take baths, swim, or use a hot tub until your health care provider approves. Ask your health care provider if you may take showers. You may only be allowed to take sponge baths.  Keep the bandage (dressing) dry until your health care provider says it can be removed. If your cast, splint, or boot is not waterproof: Do not let it get wet. Cover it with a watertight covering when you take a bath or a shower. Incision care  Follow instructions from your health care provider about how to take care of your incision. Make sure you: Wash your hands with soap and water before you change your dressing. If soap and water are not available, use hand sanitizer. Change your dressing as told by your health care provider. Leave stitches (sutures), skin glue, or adhesive strips in place. These skin closures may need to stay in place for 2 weeks or longer. If adhesive strip edges start to loosen and curl up,  you may trim the loose edges. Do not remove adhesive strips completely unless your health care provider tells you to do that. Check your incision area every day for signs of infection. Check for: Redness. More swelling or pain. More fluid or blood. Warmth. Pus or a bad smell. Managing pain, stiffness, and swelling  If directed, put ice on the affected area: If you have a removable splint or boot, remove it as told by your health care provider. Put ice in a plastic bag. Place a towel between your skin and the bag. Leave the ice on for 20 minutes, 2-3 times a day. Move your fingers or toes often to avoid stiffness and to lessen swelling. Raise (elevate) the injured area above the level of your heart while you are sitting or lying down. Driving Do not drive or use heavy machinery while taking prescription pain medicine. Do not drive for 24 hours if you were given a medicine to help you relax (sedative) during your procedure. Ask your health care provider when it is safe to drive if you have a cast, splint, or boot on the affected limb. Activity Ask your health care provider what activities are safe for you during recovery, and ask what activities you need to avoid. Do not use the injured limb to support your body weight until your health care provider says that you can. Do not play contact sports until your health care provider approves. Do exercises as told by your health care provider. Avoid sitting for a long time without moving. Get up and move around at least every few hours. This will help prevent blood clots. General instructions Do not put pressure on any part of the cast or splint until it is fully hardened. This may take several hours. If you are taking prescription pain medicine, take actions to prevent or treat constipation. Your health care provider may recommend that you: Drink enough fluid to keep your urine pale yellow. Eat foods that are high in fiber, such as fresh fruits  and vegetables, whole grains, and beans. Limit foods that are high in fat and processed sugars, such as fried or sweet foods. Take an over-the-counter or prescription medicine for constipation. Do not use any products that contain nicotine or tobacco, such as cigarettes and e-cigarettes. These can delay bone healing after surgery. If you need help quitting, ask your health care provider. Take over-the-counter and prescription medicines only as told by your health care provider. Keep all follow-up visits as told by your health care provider. This is important. Contact a health care provider if: You have lasting pain. You have redness around your incision. You have more swelling or pain around your incision. You have more fluid or blood coming from  your incision. Your incision feels warm to the touch. You have pus or a bad smell coming from your incision. You are unable to do exercises or physical activity as told by your health care provider. Get help right away if: You have difficulty breathing. You have chest pain. You have severe pain. You have a fever or chills. You have numbness for more than 24 hours in the area where the hardware was removed. Summary After the procedure, it is common to have some pain and swelling in the area where the hardware was removed. Follow instructions from your health care provider about how to take care of your incision. Return to your normal activities as told by your health care provider. Ask your health care provider what activities are safe for you. This information is not intended to replace advice given to you by your health care provider. Make sure you discuss any questions you have with your health care provider. Document Revised: 06/21/2021 Document Reviewed: 05/08/2020 Elsevier Patient Education  [place] Anesthesia, Adult, Care After The following information offers guidance on how to care for yourself after your procedure.  Your health care provider may also give you more specific instructions. If you have problems or questions, contact your health care provider. What can I expect after the procedure? After the procedure, it is common for people to: Have pain or discomfort at the IV site. Have nausea or vomiting. Have a sore throat or hoarseness. Have trouble concentrating. Feel cold or chills. Feel weak, sleepy, or tired (fatigue). Have soreness and body aches. These can affect parts of the body that were not involved in surgery. Follow these instructions at home: For the time period you were told by your health care provider:  Rest. Do not participate in activities where you could fall or become injured. Do not drive or use machinery. Do not drink alcohol. Do not take sleeping pills or medicines that cause drowsiness. Do not make important decisions or sign legal documents. Do not take care of children on your own. General instructions Drink enough fluid to keep your urine pale yellow. If you have sleep apnea, surgery and certain medicines can increase your risk for breathing problems. Follow instructions from your health care provider about wearing your sleep device: Anytime you are sleeping, including during daytime naps. While taking prescription pain medicines, sleeping medicines, or medicines that make you drowsy. Return to your normal activities as told by your health care provider. Ask your health care provider what activities are safe for you. Take over-the-counter and prescription medicines only as told by your health care provider. Do not use any products that contain nicotine or tobacco. These products include cigarettes, chewing tobacco, and vaping devices, such as e-cigarettes. These can delay incision healing after surgery. If you need help quitting, ask your health care provider. Contact a health care provider if: You have nausea or vomiting that does not get better with medicine. You  vomit every time you eat or drink. You have pain that does not get better with medicine. You cannot urinate or have bloody urine. You develop a skin rash. You have a fever. Get help right away if: You have trouble breathing. You have chest pain. You vomit blood. These symptoms may be an emergency. Get help right away. Call 911. Do not wait to see if the symptoms will go away. Do not drive yourself to the hospital. Summary After the procedure, it is common to have a sore throat, hoarseness, nausea,  vomiting, or to feel weak, sleepy, or fatigue. For the time period you were told by your health care provider, do not drive or use machinery. Get help right away if you have difficulty breathing, have chest pain, or vomit blood. These symptoms may be an emergency. This information is not intended to replace advice given to you by your health care provider. Make sure you discuss any questions you have with your health care provider. Document Revised: 05/21/2021 Document Reviewed: 05/21/2021 Elsevier Patient Education  [place]. How to Use Chlorhexidine Before Surgery Chlorhexidine gluconate (CHG) is a germ-killing (antiseptic) solution that is used to clean the skin. It can get rid of the bacteria that normally live on the skin and can keep them away for about 24 hours. To clean your skin with CHG, you may be given: A CHG solution to use in the shower or as part of a sponge bath. A prepackaged cloth that contains CHG. Cleaning your skin with CHG may help lower the risk for infection: While you are staying in the intensive care unit of the hospital. If you have a vascular access, such as a central line, to provide short-term or long-term access to your veins. If you have a catheter to drain urine from your bladder. If you are on a ventilator. A ventilator is a machine that helps you breathe by moving air in and out of your lungs. After surgery. What are the risks? Risks of using CHG  include: A skin reaction. Hearing loss, if CHG gets in your ears and you have a perforated eardrum. Eye injury, if CHG gets in your eyes and is not rinsed out. The CHG product catching fire. Make sure that you avoid smoking and flames after applying CHG to your skin. Do not use CHG: If you have a chlorhexidine allergy or have previously reacted to chlorhexidine. On babies younger than 46 months of age. How to use CHG solution Use CHG only as told by your health care provider, and follow the instructions on the label. Use the full amount of CHG as directed. Usually, this is one bottle. During a shower Follow these steps when using CHG solution during a shower (unless your health care provider gives you different instructions): Start the shower. Use your normal soap and shampoo to wash your face and hair. Turn off the shower or move out of the shower stream. Pour the CHG onto a clean washcloth. Do not use any type of brush or rough-edged sponge. Starting at your neck, lather your body down to your toes. Make sure you follow these instructions: If you will be having surgery, pay special attention to the part of your body where you will be having surgery. Scrub this area for at least 1 minute. Do not use CHG on your head or face. If the solution gets into your ears or eyes, rinse them well with water. Avoid your genital area. Avoid any areas of skin that have broken skin, cuts, or scrapes. Scrub your back and under your arms. Make sure to wash skin folds. Let the lather sit on your skin for 1-2 minutes or as long as told by your health care provider. Thoroughly rinse your entire body in the shower. Make sure that all body creases and crevices are rinsed well. Dry off with a clean towel. Do not put any substances on your body afterward--such as powder, lotion, or perfume--unless you are told to do so by your health care provider. Only use lotions that are  recommended by the manufacturer. Put on  clean clothes or pajamas. If it is the night before your surgery, sleep in clean sheets.  During a sponge bath Follow these steps when using CHG solution during a sponge bath (unless your health care provider gives you different instructions): Use your normal soap and shampoo to wash your face and hair. Pour the CHG onto a clean washcloth. Starting at your neck, lather your body down to your toes. Make sure you follow these instructions: If you will be having surgery, pay special attention to the part of your body where you will be having surgery. Scrub this area for at least 1 minute. Do not use CHG on your head or face. If the solution gets into your ears or eyes, rinse them well with water. Avoid your genital area. Avoid any areas of skin that have broken skin, cuts, or scrapes. Scrub your back and under your arms. Make sure to wash skin folds. Let the lather sit on your skin for 1-2 minutes or as long as told by your health care provider. Using a different clean, wet washcloth, thoroughly rinse your entire body. Make sure that all body creases and crevices are rinsed well. Dry off with a clean towel. Do not put any substances on your body afterward--such as powder, lotion, or perfume--unless you are told to do so by your health care provider. Only use lotions that are recommended by the manufacturer. Put on clean clothes or pajamas. If it is the night before your surgery, sleep in clean sheets. How to use CHG prepackaged cloths Only use CHG cloths as told by your health care provider, and follow the instructions on the label. Use the CHG cloth on clean, dry skin. Do not use the CHG cloth on your head or face unless your health care provider tells you to. When washing with the CHG cloth: Avoid your genital area. Avoid any areas of skin that have broken skin, cuts, or scrapes. Before surgery Follow these steps when using a CHG cloth to clean before surgery (unless your health care  provider gives you different instructions): Using the CHG cloth, vigorously scrub the part of your body where you will be having surgery. Scrub using a back-and-forth motion for 3 minutes. The area on your body should be completely wet with CHG when you are done scrubbing. Do not rinse. Discard the cloth and let the area air-dry. Do not put any substances on the area afterward, such as powder, lotion, or perfume. Put on clean clothes or pajamas. If it is the night before your surgery, sleep in clean sheets.  For general bathing Follow these steps when using CHG cloths for general bathing (unless your health care provider gives you different instructions). Use a separate CHG cloth for each area of your body. Make sure you wash between any folds of skin and between your fingers and toes. Wash your body in the following order, switching to a new cloth after each step: The front of your neck, shoulders, and chest. Both of your arms, under your arms, and your hands. Your stomach and groin area, avoiding the genitals. Your right leg and foot. Your left leg and foot. The back of your neck, your back, and your buttocks. Do not rinse. Discard the cloth and let the area air-dry. Do not put any substances on your body afterward--such as powder, lotion, or perfume--unless you are told to do so by your health care provider. Only use lotions that are recommended by  the manufacturer. Put on clean clothes or pajamas. Contact a health care provider if: Your skin gets irritated after scrubbing. You have questions about using your solution or cloth. You swallow any chlorhexidine. Call your local poison control center (1-360 765 9341 in the [place] Get help right away if: Your eyes itch badly, or they become very red or swollen. Your skin itches badly and is red or swollen. Your hearing changes. You have trouble seeing. You have swelling or tingling in your mouth or throat. You have trouble breathing. These  symptoms may represent a serious problem that is an emergency. Do not wait to see if the symptoms will go away. Get medical help right away. Call your local emergency services (911 in the [place] Do not drive yourself to the hospital. Summary Chlorhexidine gluconate (CHG) is a germ-killing (antiseptic) solution that is used to clean the skin. Cleaning your skin with CHG may help to lower your risk for infection. You may be given CHG to use for bathing. It may be in a bottle or in a prepackaged cloth to use on your skin. Carefully follow your health care provider's instructions and the instructions on the product label. Do not use CHG if you have a chlorhexidine allergy. Contact your health care provider if your skin gets irritated after scrubbing. This information is not intended to replace advice given to you by your health care provider. Make sure you discuss any questions you have with your health care provider. Document Revised: 06/21/2021 Document Reviewed: 05/04/2020 Elsevier Patient Education  [place].

## 2021-12-28 NOTE — Telephone Encounter (Signed)
Done

## 2021-12-28 NOTE — Telephone Encounter (Signed)
Per voice message on triage line from patient - relaying she will need to postpone her surgery scheduled 01/03/22, until after the first of the new year. Please call.

## 2021-12-28 NOTE — Telephone Encounter (Signed)
Spoke with pt who states she doesn't want to have surgery this close to the holiday's would like to postpone to the first of the year. Asked pt to give [place] a call back when she's ready to get scheduled. Verbalized understanding.

## 2021-12-29 ENCOUNTER — Encounter (HOSPITAL_COMMUNITY)
Admission: RE | Admit: 2021-12-29 | Discharge: 2021-12-29 | Disposition: A | Discharge status: Home / Self Care | Payer: Medicare Other | Source: Ambulatory Visit | Attending: Orthopedic Surgery | Admitting: Orthopedic Surgery

## 2021-12-29 ENCOUNTER — Encounter (HOSPITAL_COMMUNITY): Payer: Self-pay

## 2022-01-03 ENCOUNTER — Ambulatory Visit (HOSPITAL_COMMUNITY): Admission: RE | Admit: 2022-01-03 | Payer: Medicare Other | Source: Home / Self Care | Admitting: Orthopedic Surgery

## 2022-01-03 ENCOUNTER — Encounter (HOSPITAL_COMMUNITY): Admission: RE | Payer: Self-pay | Source: Home / Self Care

## 2022-01-03 SURGERY — REMOVAL, HARDWARE
Anesthesia: Choice | Laterality: Right

## 2022-03-15 ENCOUNTER — Ambulatory Visit: Payer: 59 | Admitting: Orthopedic Surgery

## 2022-03-29 ENCOUNTER — Encounter: Payer: Self-pay | Admitting: Orthopedic Surgery

## 2022-03-29 ENCOUNTER — Other Ambulatory Visit (HOSPITAL_COMMUNITY): Payer: Self-pay | Admitting: Family Medicine

## 2022-03-29 ENCOUNTER — Ambulatory Visit (INDEPENDENT_AMBULATORY_CARE_PROVIDER_SITE_OTHER): Payer: 59 | Admitting: Orthopedic Surgery

## 2022-03-29 VITALS — BP 152/98 | HR 101 | Ht 66.0 in | Wt 172.0 lb

## 2022-03-29 DIAGNOSIS — Z01818 Encounter for other preprocedural examination: Secondary | ICD-10-CM

## 2022-03-29 DIAGNOSIS — T8484XA Pain due to internal orthopedic prosthetic devices, implants and grafts, initial encounter: Secondary | ICD-10-CM

## 2022-03-29 DIAGNOSIS — Z87891 Personal history of nicotine dependence: Secondary | ICD-10-CM

## 2022-03-29 NOTE — Patient Instructions (Signed)
Plan to remove screws under anesthesia  Follow up approximately 2 weeks after surgery

## 2022-03-29 NOTE — Progress Notes (Addendum)
Orthopaedic Postop Note  Assessment: Laura Mcgee is a 72 y.o. female s/p IM nail of right humerus shaft fracture  DOS: 06/25/2021  Plan: Laura Mcgee continues to have right shoulder pain. She has had an injection in the past with some improvement in her symptoms.  We could also attempt more targeted injections as well.  We have also discussed removal of the proximal screws, as she appears to be very tender in the location of the screws.  However, she has elected to put off the surgery.  She continues to have pain.  On physical exam, she has tenderness to palpation directly over the anterior aspect of the proximal screws.  The screws may be prominent in this area as well.  I think it is reasonable to consider having the screws removed, to see if this improves her overall symptoms.  Or, we could also consider removal of the humeral nail, but would prefer to wait until at least the one year anniversary of surgery.  She states her understanding of this plan.  She states that she will consider the plan to proceed with surgery in the future.  If possible, we could proceed with surgery without undergoing general anesthesia.   Risks and benefits of the surgery, including, but not limited to infection, bleeding, persistent pain, need for further surgery, persistent pain, stiffness and more severe complications associated with anesthesia were discussed with the patient.  The patient will contact the clinic if she wishes to proceed.  All questions have been answered.   Follow-up: Return if interested in proceeding with surgery XR at next visit: TBD  Subjective:  Chief Complaint  Patient presents with   Shoulder Pain    Rt shoulder pain that is shooting down to elbow area for 2 mos.     History of Present Illness: Laura Mcgee is a 72 y.o. female who presents following the above stated procedure.  Surgery was last April.  She continues to have pain in the right shoulder.  For the past couple of  months, she has had pain that radiates distally from the shoulder.  Pain is in the anterior and posterior aspect of the shoulder.  She does note some grinding in the shoulder.  Will radiate from her shoulder to her elbow.  We previously discussed removal of the proximal screws, but she prefers to wait due to other medical issues. No pain in the midshaft of the arm.  She notes that the pain acutely worsened after falling.   review of Systems: No fevers or chills No numbness or tingling No Chest Pain No shortness of breath   Objective: BP (!) 152/98   Pulse (!) 101   Ht 5\' 6"  (1.676 m)   Wt 172 lb (78 kg)   LMP 06/20/1997 (Approximate) Comment: post menopausal  BMI 27.76 kg/m   Physical Exam:  Alert and oriented.  No acute distress.  Well healed surgical incisions.  Tenderness over the anterior shoulder, in line with the incisions and the superior screws.  She also has some tenderness in the posterior shoulder.  Some pain with range of motion of the shoulder, with some crepitus.  Fingers are warm and well-perfused.  No tenderness to palpation within the mid aspect or the distal aspect of the right humerus.  All surgical incisions have healed well.  IMAGING: I personally ordered and reviewed the following images:  No new imaging obtained today  Oliver Barre, MD 03/29/2022 2:29 PM

## 2022-03-30 ENCOUNTER — Other Ambulatory Visit: Payer: Self-pay

## 2022-03-30 DIAGNOSIS — S42331D Displaced oblique fracture of shaft of humerus, right arm, subsequent encounter for fracture with routine healing: Secondary | ICD-10-CM

## 2022-03-30 DIAGNOSIS — T8484XA Pain due to internal orthopedic prosthetic devices, implants and grafts, initial encounter: Secondary | ICD-10-CM

## 2022-03-31 ENCOUNTER — Encounter: Payer: Self-pay | Admitting: *Deleted

## 2022-03-31 NOTE — Patient Instructions (Addendum)
Laura Mcgee  03/31/2022     '@PREFPERIOPPHARMACY'$ @   Your procedure is scheduled on  04/04/2022.   Report to [facility]  [place]  [facility]   Call this number if you have problems the morning of surgery:  [phone]  If you experience any cold or flu symptoms such as cough, fever, chills, shortness of breath, etc. between now and your scheduled surgery, please notify us at the above number.   Remember:  Do not eat after midnight.    You may drink clear liquids until 0550 am on 04/04/2022.    Clear liquids allowed are:                    Water, Juice (No red color; non-citric and without pulp; diabetics please choose diet or no sugar options), Carbonated beverages (diabetics please choose diet or no sugar options), Clear Tea (No creamer, milk, or cream, including half & half and powdered creamer), Black Coffee Only (No creamer, milk or cream, including half & half and powdered creamer), Plain Jell-O Only (No red color; diabetics please choose no sugar options), Clear Sports drink (No red color; diabetics please choose diet or no sugar options), and Plain Popsicles Only (No red color; diabetics please choose no sugar options)       At 0550 am on 04/04/2022 drink your carb drink. You can have nothing after this to drink.    Take these medicines the morning of surgery with A SIP OF WATER         amlodipine, baclofen, cymbalta, hydrocodone(if needed), pantoprazole, effexor.    Do not wear jewelry, make-up or nail polish.  Do not wear lotions, powders, or perfumes, or deodorant.  Do not shave 48 hours prior to surgery.  Men may shave face and neck.  Do not bring valuables to the hospital.  [facility] is not responsible for any belongings or valuables.  Contacts, dentures or bridgework may not be worn into surgery.  Leave your suitcase in the car.  After surgery it may be brought to your room.  For patients admitted to the hospital, discharge time will be determined by your  treatment team.  Patients discharged the day of surgery will not be allowed to drive home and must have someone with them for 24 hours.    Special instructions:   DO NOT smoke tobacco or vape for 24 hours before your procedure.  Please read over the following fact sheets that you were given. Pain Booklet, Coughing and Deep Breathing, Surgical Site Infection Prevention, Anesthesia Post-op Instructions, and Care and Recovery After Surgery       Orthopedic Hardware Removal, Care After The following information offers guidance on how to care for yourself after your procedure. Your health care provider may also give you more specific instructions. If you have problems or questions, contact your health care provider. What can I expect after the procedure? After the procedure, it is common to have: Soreness or pain. Some redness and swelling in the area where the hardware was removed. A small amount of blood or fluid coming from your incision. Follow these instructions at home: Medicines Take over-the-counter and prescription medicines only as told by your health care provider. Ask your health care provider if the medicine prescribed to you: Requires you to avoid driving or using machinery. Can cause constipation. You may need to take these actions to prevent or treat constipation: Drink enough fluid to keep your  urine pale yellow. Take over-the-counter or prescription medicines. Eat foods that are high in fiber, such as beans, whole grains, and fresh fruits and vegetables. Limit foods that are high in fat and processed sugars, such as fried or sweet foods. If you have a nonremovable cast: Do not put pressure on any part of the cast until it is fully hardened. This may take several hours. Do not stick anything inside the cast to scratch your skin. Doing that increases your risk of infection. Check the skin around the cast every day. Tell your health care provider about any concerns. You  may put lotion on dry skin around the edges of the cast. Do not put lotion on the skin underneath the cast. Keep the cast clean and dry. If you have a removable splint or boot: Wear the splint or boot as told by your health care provider. Remove it only as told by your health care provider. Check the skin around the splint or boot every day. Tell your health care provider about any concerns. Loosen the splint or boot if your fingers or toes tingle, become numb, or turn cold and blue. Keep the splint or boot clean and dry. Bathing Do not take baths, swim, or use a hot tub until your health care provider approves. Ask your health care provider if you may take showers. You may only be allowed to take sponge baths. Keep the bandage (dressing) dry until your health care provider says it can be removed. If the cast, splint, or boot is not waterproof: Do not let it get wet. Cover it with a watertight covering when you take a bath or a shower. Incision care  Follow instructions from your health care provider about how to take care of your incision. Make sure you: Wash your hands with soap and water for at least 20 seconds before and after you change your dressing. If soap and water are not available, use hand sanitizer. Change your dressing as told by your health care provider. Leave stitches (sutures), staples, skin glue, or adhesive strips in place. These skin closures may need to stay in place for 2 weeks or longer. If adhesive strip edges start to loosen and curl up, you may trim the loose edges. Do not remove adhesive strips completely unless your health care provider tells you to do that. Check your incision area every day for signs of infection. Check for: More redness, swelling, or pain. More fluid or blood. Warmth. Pus or a bad smell. Managing pain, stiffness, and swelling  If directed, put ice on the affected area. To do this: If you have a removable splint or boot, remove it as told by  your health care provider. Put ice in a plastic bag. Place a towel between your skin and the bag or between your cast and the bag. Leave the ice on for 20 minutes, 2-3 times a day. If your skin turns bright red, remove the ice right away to prevent skin damage. The risk of skin damage is higher if you cannot feel pain, heat, or cold. Move your fingers or toes often to reduce stiffness and swelling. Raise (elevate) the injured area above the level of your heart while you are sitting or lying down. Activity Rest as told by your health care provider. Do not sit for a long time without moving. Get up to take short walks every 1-2 hours. This will improve blood flow and breathing. Ask for help if you feel weak or unsteady. Do  not use the injured limb to support your body weight until your health care provider says that you can. Ask your health care provider when it is safe to drive if you have a cast, splint, or boot on your affected limb. Do exercises as told by your health care provider. Return to your normal activities as told by your health care provider. Ask your health care provider what activities are safe for you. General instructions Do not use any products that contain nicotine or tobacco. These products include cigarettes, chewing tobacco, and vaping devices, such as e-cigarettes. These can delay bone healing. If you need help quitting, ask your health care provider. Wear compression stockings as told by your health care provider. These stockings help to prevent blood clots and reduce swelling in your legs. Keep all follow-up visits. Your health care provider may need to monitor your healing and check for problems. Contact a health care provider if: You have numbness for more than 24 hours in the area where the hardware was removed. You have signs of infection at your incision area. You have a fever or chills. Your pain is not controlled by medicine. You are unable to do exercises or  physical activity as told by your health care provider. Get help right away if: You have trouble breathing. You have chest pain. These symptoms may be an emergency. Get help right away. Call 911. Do not wait to see if the symptoms will go away. Do not drive yourself to the hospital. This information is not intended to replace advice given to you by your health care provider. Make sure you discuss any questions you have with your health care provider. Document Revised: 07/16/2021 Document Reviewed: 07/16/2021 Elsevier Patient Education  [place] Anesthesia, Adult, Care After The following information offers guidance on how to care for yourself after your procedure. Your health care provider may also give you more specific instructions. If you have problems or questions, contact your health care provider. What can I expect after the procedure? After the procedure, it is common for people to: Have pain or discomfort at the IV site. Have nausea or vomiting. Have a sore throat or hoarseness. Have trouble concentrating. Feel cold or chills. Feel weak, sleepy, or tired (fatigue). Have soreness and body aches. These can affect parts of the body that were not involved in surgery. Follow these instructions at home: For the time period you were told by your health care provider:  Rest. Do not participate in activities where you could fall or become injured. Do not drive or use machinery. Do not drink alcohol. Do not take sleeping pills or medicines that cause drowsiness. Do not make important decisions or sign legal documents. Do not take care of children on your own. General instructions Drink enough fluid to keep your urine pale yellow. If you have sleep apnea, surgery and certain medicines can increase your risk for breathing problems. Follow instructions from your health care provider about wearing your sleep device: Anytime you are sleeping, including during daytime  naps. While taking prescription pain medicines, sleeping medicines, or medicines that make you drowsy. Return to your normal activities as told by your health care provider. Ask your health care provider what activities are safe for you. Take over-the-counter and prescription medicines only as told by your health care provider. Do not use any products that contain nicotine or tobacco. These products include cigarettes, chewing tobacco, and vaping devices, such as e-cigarettes. These can delay incision healing after  surgery. If you need help quitting, ask your health care provider. Contact a health care provider if: You have nausea or vomiting that does not get better with medicine. You vomit every time you eat or drink. You have pain that does not get better with medicine. You cannot urinate or have bloody urine. You develop a skin rash. You have a fever. Get help right away if: You have trouble breathing. You have chest pain. You vomit blood. These symptoms may be an emergency. Get help right away. Call 911. Do not wait to see if the symptoms will go away. Do not drive yourself to the hospital. Summary After the procedure, it is common to have a sore throat, hoarseness, nausea, vomiting, or to feel weak, sleepy, or fatigue. For the time period you were told by your health care provider, do not drive or use machinery. Get help right away if you have difficulty breathing, have chest pain, or vomit blood. These symptoms may be an emergency. This information is not intended to replace advice given to you by your health care provider. Make sure you discuss any questions you have with your health care provider. Document Revised: 05/21/2021 Document Reviewed: 05/21/2021 Elsevier Patient Education  [place]. How to Use Chlorhexidine Before Surgery Chlorhexidine gluconate (CHG) is a germ-killing (antiseptic) solution that is used to clean the skin. It can get rid of the bacteria that  normally live on the skin and can keep them away for about 24 hours. To clean your skin with CHG, you may be given: A CHG solution to use in the shower or as part of a sponge bath. A prepackaged cloth that contains CHG. Cleaning your skin with CHG may help lower the risk for infection: While you are staying in the intensive care unit of the hospital. If you have a vascular access, such as a central line, to provide short-term or long-term access to your veins. If you have a catheter to drain urine from your bladder. If you are on a ventilator. A ventilator is a machine that helps you breathe by moving air in and out of your lungs. After surgery. What are the risks? Risks of using CHG include: A skin reaction. Hearing loss, if CHG gets in your ears and you have a perforated eardrum. Eye injury, if CHG gets in your eyes and is not rinsed out. The CHG product catching fire. Make sure that you avoid smoking and flames after applying CHG to your skin. Do not use CHG: If you have a chlorhexidine allergy or have previously reacted to chlorhexidine. On babies younger than 42 months of age. How to use CHG solution Use CHG only as told by your health care provider, and follow the instructions on the label. Use the full amount of CHG as directed. Usually, this is one bottle. During a shower Follow these steps when using CHG solution during a shower (unless your health care provider gives you different instructions): Start the shower. Use your normal soap and shampoo to wash your face and hair. Turn off the shower or move out of the shower stream. Pour the CHG onto a clean washcloth. Do not use any type of brush or rough-edged sponge. Starting at your neck, lather your body down to your toes. Make sure you follow these instructions: If you will be having surgery, pay special attention to the part of your body where you will be having surgery. Scrub this area for at least 1 minute. Do not use CHG  on  your head or face. If the solution gets into your ears or eyes, rinse them well with water. Avoid your genital area. Avoid any areas of skin that have broken skin, cuts, or scrapes. Scrub your back and under your arms. Make sure to wash skin folds. Let the lather sit on your skin for 1-2 minutes or as long as told by your health care provider. Thoroughly rinse your entire body in the shower. Make sure that all body creases and crevices are rinsed well. Dry off with a clean towel. Do not put any substances on your body afterward--such as powder, lotion, or perfume--unless you are told to do so by your health care provider. Only use lotions that are recommended by the manufacturer. Put on clean clothes or pajamas. If it is the night before your surgery, sleep in clean sheets.  During a sponge bath Follow these steps when using CHG solution during a sponge bath (unless your health care provider gives you different instructions): Use your normal soap and shampoo to wash your face and hair. Pour the CHG onto a clean washcloth. Starting at your neck, lather your body down to your toes. Make sure you follow these instructions: If you will be having surgery, pay special attention to the part of your body where you will be having surgery. Scrub this area for at least 1 minute. Do not use CHG on your head or face. If the solution gets into your ears or eyes, rinse them well with water. Avoid your genital area. Avoid any areas of skin that have broken skin, cuts, or scrapes. Scrub your back and under your arms. Make sure to wash skin folds. Let the lather sit on your skin for 1-2 minutes or as long as told by your health care provider. Using a different clean, wet washcloth, thoroughly rinse your entire body. Make sure that all body creases and crevices are rinsed well. Dry off with a clean towel. Do not put any substances on your body afterward--such as powder, lotion, or perfume--unless you are told to  do so by your health care provider. Only use lotions that are recommended by the manufacturer. Put on clean clothes or pajamas. If it is the night before your surgery, sleep in clean sheets. How to use CHG prepackaged cloths Only use CHG cloths as told by your health care provider, and follow the instructions on the label. Use the CHG cloth on clean, dry skin. Do not use the CHG cloth on your head or face unless your health care provider tells you to. When washing with the CHG cloth: Avoid your genital area. Avoid any areas of skin that have broken skin, cuts, or scrapes. Before surgery Follow these steps when using a CHG cloth to clean before surgery (unless your health care provider gives you different instructions): Using the CHG cloth, vigorously scrub the part of your body where you will be having surgery. Scrub using a back-and-forth motion for 3 minutes. The area on your body should be completely wet with CHG when you are done scrubbing. Do not rinse. Discard the cloth and let the area air-dry. Do not put any substances on the area afterward, such as powder, lotion, or perfume. Put on clean clothes or pajamas. If it is the night before your surgery, sleep in clean sheets.  For general bathing Follow these steps when using CHG cloths for general bathing (unless your health care provider gives you different instructions). Use a separate CHG cloth for each  area of your body. Make sure you wash between any folds of skin and between your fingers and toes. Wash your body in the following order, switching to a new cloth after each step: The front of your neck, shoulders, and chest. Both of your arms, under your arms, and your hands. Your stomach and groin area, avoiding the genitals. Your right leg and foot. Your left leg and foot. The back of your neck, your back, and your buttocks. Do not rinse. Discard the cloth and let the area air-dry. Do not put any substances on your body  afterward--such as powder, lotion, or perfume--unless you are told to do so by your health care provider. Only use lotions that are recommended by the manufacturer. Put on clean clothes or pajamas. Contact a health care provider if: Your skin gets irritated after scrubbing. You have questions about using your solution or cloth. You swallow any chlorhexidine. Call your local poison control center (1-365-553-6499 in the [place] Get help right away if: Your eyes itch badly, or they become very red or swollen. Your skin itches badly and is red or swollen. Your hearing changes. You have trouble seeing. You have swelling or tingling in your mouth or throat. You have trouble breathing. These symptoms may represent a serious problem that is an emergency. Do not wait to see if the symptoms will go away. Get medical help right away. Call your local emergency services (911 in the [place] Do not drive yourself to the hospital. Summary Chlorhexidine gluconate (CHG) is a germ-killing (antiseptic) solution that is used to clean the skin. Cleaning your skin with CHG may help to lower your risk for infection. You may be given CHG to use for bathing. It may be in a bottle or in a prepackaged cloth to use on your skin. Carefully follow your health care provider's instructions and the instructions on the product label. Do not use CHG if you have a chlorhexidine allergy. Contact your health care provider if your skin gets irritated after scrubbing. This information is not intended to replace advice given to you by your health care provider. Make sure you discuss any questions you have with your health care provider. Document Revised: 06/21/2021 Document Reviewed: 05/04/2020 Elsevier Patient Education  [place].

## 2022-04-01 ENCOUNTER — Encounter (HOSPITAL_COMMUNITY)
Admission: RE | Admit: 2022-04-01 | Discharge: 2022-04-01 | Disposition: A | Discharge status: Home / Self Care | Payer: 59 | Source: Ambulatory Visit | Attending: Orthopedic Surgery | Admitting: Orthopedic Surgery

## 2022-04-01 ENCOUNTER — Other Ambulatory Visit: Payer: Self-pay

## 2022-04-01 ENCOUNTER — Encounter (HOSPITAL_COMMUNITY): Payer: Self-pay

## 2022-04-01 VITALS — BP 154/95 | HR 102 | Temp 97.8°F | Resp 18 | Ht 66.0 in | Wt 172.0 lb

## 2022-04-01 DIAGNOSIS — I4891 Unspecified atrial fibrillation: Secondary | ICD-10-CM | POA: Insufficient documentation

## 2022-04-01 DIAGNOSIS — Z01818 Encounter for other preprocedural examination: Secondary | ICD-10-CM | POA: Insufficient documentation

## 2022-04-01 HISTORY — DX: Unspecified osteoarthritis, unspecified site: M19.90

## 2022-04-01 HISTORY — DX: Unspecified atrial fibrillation: I48.91

## 2022-04-01 HISTORY — DX: Cardiac arrhythmia, unspecified: I49.9

## 2022-04-01 HISTORY — DX: Chronic obstructive pulmonary disease, unspecified: J44.9

## 2022-04-01 LAB — BASIC METABOLIC PANEL
Anion gap: 13 (ref 5–15)
BUN: 16 mg/dL (ref 8–23)
CO2: 19 mmol/L — ABNORMAL LOW (ref 22–32)
Calcium: 9.5 mg/dL (ref 8.9–10.3)
Chloride: 103 mmol/L (ref 98–111)
Creatinine, Ser: 0.74 mg/dL (ref 0.44–1.00)
GFR, Estimated: >60 mL/min (ref >60)
Glucose, Bld: 87 mg/dL (ref 70–99)
Potassium: 4 mmol/L (ref 3.5–5.1)
Sodium: 135 mmol/L (ref 135–145)

## 2022-04-01 LAB — CBC
HCT: 49.1 % — ABNORMAL HIGH (ref 36.0–46.0)
Hemoglobin: 16.7 g/dL — ABNORMAL HIGH (ref 12.0–15.0)
MCH: 33.4 pg (ref 26.0–34.0)
MCHC: 34 g/dL (ref 30.0–36.0)
MCV: 98.2 fL (ref 80.0–100.0)
Platelets: 285 10*3/uL (ref 150–400)
RBC: 5 MIL/uL (ref 3.87–5.11)
RDW: 14.3 % (ref 11.5–15.5)
WBC: 11.6 10*3/uL — ABNORMAL HIGH (ref 4.0–10.5)
nRBC: 0 % (ref 0.0–0.2)

## 2022-04-01 NOTE — Progress Notes (Signed)
Patient presents to PAT appointment SO[facility] with a congestive productive cough.  EKG shows sinus tachycardia with a history of atrial Fibrillation and is not taking her medication. Dr Charna Elizabeth request patient to follow up with Dr Karie Kirks for clearance. Patient notified.

## 2022-04-04 ENCOUNTER — Encounter (HOSPITAL_COMMUNITY): Admission: RE | Payer: Self-pay | Source: Home / Self Care

## 2022-04-04 ENCOUNTER — Ambulatory Visit (HOSPITAL_COMMUNITY): Admission: RE | Admit: 2022-04-04 | Payer: 59 | Source: Home / Self Care | Admitting: Orthopedic Surgery

## 2022-04-04 SURGERY — REMOVAL, HARDWARE
Anesthesia: Choice | Laterality: Right

## 2022-04-05 ENCOUNTER — Encounter (HOSPITAL_COMMUNITY): Payer: 59

## 2022-04-05 ENCOUNTER — Encounter: Payer: Self-pay | Admitting: Internal Medicine

## 2022-04-26 ENCOUNTER — Encounter: Payer: 59 | Admitting: Orthopedic Surgery

## 2022-04-28 ENCOUNTER — Ambulatory Visit (HOSPITAL_COMMUNITY): Admission: RE | Admit: 2022-04-28 | Payer: 59 | Source: Ambulatory Visit

## 2022-04-28 ENCOUNTER — Encounter (HOSPITAL_COMMUNITY): Payer: Self-pay

## 2022-05-03 ENCOUNTER — Telehealth (INDEPENDENT_AMBULATORY_CARE_PROVIDER_SITE_OTHER): Payer: Self-pay | Admitting: Gastroenterology

## 2022-05-03 ENCOUNTER — Encounter: Payer: Self-pay | Admitting: Gastroenterology

## 2022-05-03 ENCOUNTER — Ambulatory Visit (INDEPENDENT_AMBULATORY_CARE_PROVIDER_SITE_OTHER): Payer: 59 | Admitting: Gastroenterology

## 2022-05-03 VITALS — BP 148/93 | HR 125 | Temp 98.0°F | Ht 66.0 in | Wt 169.8 lb

## 2022-05-03 DIAGNOSIS — Z1211 Encounter for screening for malignant neoplasm of colon: Secondary | ICD-10-CM

## 2022-05-03 DIAGNOSIS — R110 Nausea: Secondary | ICD-10-CM

## 2022-05-03 DIAGNOSIS — K5900 Constipation, unspecified: Secondary | ICD-10-CM | POA: Diagnosis not present

## 2022-05-03 DIAGNOSIS — Z86010 Personal history of colonic polyps: Secondary | ICD-10-CM | POA: Diagnosis not present

## 2022-05-03 DIAGNOSIS — R1011 Right upper quadrant pain: Secondary | ICD-10-CM

## 2022-05-03 DIAGNOSIS — R194 Change in bowel habit: Secondary | ICD-10-CM

## 2022-05-03 NOTE — Patient Instructions (Signed)
We are ordering a Cologuard for you.  You will receive this in the mail.  Our scheduler will also be reaching out to get you scheduled for right upper quadrant ultrasound.  I have ordered lab work for you have completed at your earliest convenience.  I will provide you with lab slips today.  Please start Colace 100 mg once to twice daily to help soften up stools.  You may also take MiraLAX 17 g or 1 capful nightly.  We will follow-up in 2 months, sooner if needed.  It was a pleasure to see you today. I want to create trusting relationships with patients. If you receive a survey regarding your visit,  I greatly appreciate you taking time to fill this out on paper or through your MyChart. I value your feedback.  Venetia Night, MSN, FNP-BC, AGACNP-BC [facility]

## 2022-05-03 NOTE — Progress Notes (Unsigned)
GI Office Note    Referring Provider: Leslie Andrea, MD Primary Care Physician:  Leslie Andrea, MD  Primary Gastroenterologist: Elon Alas. Abbey Chatters, DO  Chief Complaint   Chief Complaint  Patient presents with   change in bowel habit    Referred for change in bowel habit. Has nausea and pain in RUQ.     History of Present Illness   Janyia Carnevale is a 72 y.o. female presenting today at the request of Leslie Andrea, MD for change in bowel habits.  EGD and colonoscopy January 2017. She had a Schatzki ring, Bravo capsule placed, moderate nonerosive gastritis. Bravo study showed nonacid reflux on PPI. 7 colorectal polyps removed, one was a simple adenoma the others hyperplastic. Left colon redundant. Small internal hemorrhoids. Next colonoscopy 5 years (2022).   Last seen in the office September 2020 for change in bowel function.Complains of alternating constipation and diarrhea present for about 1 year.  Varying stool consistency noted.  Having pressure bowel movements but not necessarily go.  Had 1 soft bloody mucus.  Reported passage of stool when trying to pass gas.  No more than 2 bowel movements per day.  Has occasional nausea and pain in the right rib.  Currently intermittent and dull in nature.  May go a week without symptoms.  Unsure if related to meals or bowel movements.  Previous imaging workup noted with unremarkable gallbladder, evidence of fatty liver on ultrasound in 2019, CT A/P in 2020 with marked enlargement of both adrenal glands, severe DDD, severe spinal stenosis in the lumbar area.  May be taking hydrocodone x 3 for back pain.  He has a fiber supplement to help regulate bowel function.  Given low-dose Bentyl for abdominal pain and frequent loose stools.  Advised to assess for constipation or drowsiness.  Labs ordered to screen for celiac, thyroid, anemia.  Labs in November 2021 elevated IgA, negative TTG IgA.  Also with normal TSH and T4 at that time, negative  lipase.  Labs 06/26/2021: TSH 0.169, T - 41.31  Labs 04/01/22: Hgb 16.7, WBC 11.6, plts 285, BMP unremarkable.  Today: Has urge to use the bathroom in the mornings and sits on the commode for a while but states she is unable to go initially but hard to push out stools and states her stools are large and then gets up and feels like she needs to go again and then may not go. Later on feels the urge again but sits and nothing happens. Does not eat during the day. Admits to eating after 6 pm - habit from years prior. Feels as though she may not drink enough water during the day. Does drink a lot of tea. Sometimes has beer. When she eats at night she will have some pain and nausea. Sometimes when she doesn't eat she has nausea. Pain occurs in the RUQ, does not hurt all the time but knows it is there. Has a lot of burping that started within the last couple weeks. At times feels liek she is hungry but after looking at food she decides she does not want it. No taking pantoprazole - didn't feel like she had reflux so she stopped taking it. Getting nauseas every night and does have some in the mornings if she hasn't eaten. No vomiting. Has 1 BM daily.   Does not want another colonoscopy. Does not like going into anesthesia.   No melena, brbpr. Has lost about 10 pounds in the last 6 weeks. Appetite has decreased. Has  been increasing fiber in her tea/water.   Emesis single use of BC powder daily.   Current Outpatient Medications  Medication Sig Dispense Refill   amLODipine (NORVASC) 5 MG tablet Take 5 mg by mouth daily.     Aspirin-Salicylamide-Caffeine (BC HEADACHE POWDER PO) Take 1 Package by mouth daily as needed (headache or pain).     cholecalciferol (VITAMIN D3) 25 MCG (1000 UNIT) tablet Take 1,000 Units by mouth daily.     DULoxetine (CYMBALTA) 60 MG capsule Take 1 capsule (60 mg total) by mouth daily. 30 capsule 0   HYDROcodone-acetaminophen (NORCO) 10-325 MG tablet Take 0.5 tablets by mouth 3  (three) times daily as needed. 10 tablet 0   hydroxypropyl methylcellulose / hypromellose (ISOPTO TEARS / GONIOVISC) 2.5 % ophthalmic solution Place 1 drop into both eyes as needed for dry eyes.     lovastatin (MEVACOR) 20 MG tablet Take 20 mg by mouth daily.     olmesartan (BENICAR) 20 MG tablet Take 20 mg by mouth daily.     venlafaxine XR (EFFEXOR-XR) 150 MG 24 hr capsule Take 2 capsules (300 mg total) by mouth every evening. (Patient taking differently: Take 300 mg by mouth in the morning and at bedtime.) 60 capsule 0   amiodarone (PACERONE) 200 MG tablet Take 1 tablet (200 mg total) by mouth daily. (Patient not taking: Reported on 12/28/2021) 30 tablet 0   baclofen (LIORESAL) 10 MG tablet Take 10 mg by mouth 2 (two) times daily as needed (throat spasms). (Patient not taking: Reported on [id number])     folic acid (FOLVITE) 1 MG tablet Take 1 tablet (1 mg total) by mouth daily. (Patient not taking: Reported on 12/28/2021) 30 tablet 0   HYDROcodone-acetaminophen (NORCO) 7.5-325 MG tablet Take 1 tablet by mouth 4 (four) times daily. (Patient not taking: Reported on 05/03/2022)     losartan (COZAAR) 50 MG tablet Take 1 tablet (50 mg total) by mouth daily. (Patient not taking: Reported on 05/03/2022) 30 tablet 0   pantoprazole (PROTONIX) 40 MG tablet Take 1 tablet (40 mg total) by mouth daily. (Patient not taking: Reported on 05/03/2022) 30 tablet 0   No current facility-administered medications for this visit.    Past Medical History:  Diagnosis Date   Arthritis    Atrial fibrillation (HCC)    COPD (chronic obstructive pulmonary disease) ([place])    Depression    Difficult intubation    History of laryngospasm   Dysrhythmia    GERD (gastroesophageal reflux disease)    Hyperlipidemia    Hypertension    Numbness and tingling    Pulmonary embolism ([place]) 1973    Past Surgical History:  Procedure Laterality Date   COLONOSCOPY WITH PROPOFOL N/A 04/07/2015   SLF: 1. seven colorectal polyps  removed 2. the left colon is redundant 3. small internal hemorrhoids (1 simple adenoma, 6 hyperplastic) . Repeat in 5 years.   CYST EXCISION Left    Arm   ESOPHAGOGASTRODUODENOSCOPY (EGD) WITH PROPOFOL N/A 04/07/2015   SLF: 1. Schatzki ring 2. Bravo capsule 34 cm from the teeth 3. moderate non-erosive gastritis.    HUMERUS IM NAIL Right 06/25/2021   Procedure: INTRAMEDULLARY (IM) NAIL HUMERAL;  Surgeon: Mordecai Rasmussen, MD;  Location: [facility];  Service: Orthopedics;  Laterality: Right;   MASS EXCISION N/A 2002   [place]   MASS EXCISION Left 01/21/2015   Procedure: EXCISION CYST LEFT UPPER ARM;  Surgeon: Aviva Signs, MD;  Location: [facility];  Service: General;  Laterality: Left;  MICROLARYNGOSCOPY N/A 05/31/2016   Procedure: MICRO LARYNGOSCOPY WITH BIOPSY OF VOCAL CORD LESION;  Surgeon: Leta Baptist, MD;  Location: [place];  Service: ENT;  Laterality: N/A;   POLYPECTOMY N/A 04/07/2015   Procedure: POLYPECTOMY;  Surgeon: Danie Binder, MD;  Location: [facility];  Service: Endoscopy;  Laterality: N/A;  Descending colon polyps x 3    SAVORY DILATION N/A 04/07/2015   Procedure: SAVORY DILATION;  Surgeon: Danie Binder, MD;  [facility];  Service: Endoscopy;  Laterality: N/A;    Family History  Problem Relation Age of Onset   Pneumonia Mother    Aneurysm Father    Colon cancer Neg Hx     Allergies as of 05/03/2022 - Review Complete 05/03/2022  Allergen Reaction Noted   Gabapentin Other (See Comments) 05/22/2019   Tetracyclines & related Other (See Comments) 01/15/2015    Social History   Socioeconomic History   Marital status: Single    Spouse name: Not on file   Number of children: 1   Years of education: some college   Highest education level: Not on file  Occupational History   Occupation: Retired  Tobacco Use   Smoking status: Every Day    Packs/day: 1.00    Years: 25.00    Total pack years: 25.00    Types: Cigarettes    Passive exposure:  Current   Smokeless tobacco: Never  Vaping Use   Vaping Use: Never used  Substance and Sexual Activity   Alcohol use: Yes    Comment: Drinks two beer per night.   Drug use: Yes    Types: Marijuana    Comment: daily marijuana use   Sexual activity: Not on file  Other Topics Concern   Not on file  Social History Narrative   Lives at home alone.   Right-handed.   Two cups sweet tea daily.   Social Determinants of Health   Financial Resource Strain: Not on file  Food Insecurity: Not on file  Transportation Needs: Not on file  Physical Activity: Not on file  Stress: Not on file  Social Connections: Not on file  Intimate Partner Violence: Not on file     Review of Systems   Gen: Denies any fever, chills, fatigue, weight loss, lack of appetite.  CV: Denies chest pain, heart palpitations, peripheral edema, syncope.  Resp: Denies shortness of breath at rest or with exertion. Denies wheezing or cough.  GI: see HPI GU : Denies urinary burning, urinary frequency, urinary hesitancy MS: Denies joint pain, muscle weakness, cramps, or limitation of movement.  Derm: Denies rash, itching, dry skin Psych: Denies depression, anxiety, memory loss, and confusion Heme: Denies bruising, bleeding, and enlarged lymph nodes.   Physical Exam   BP (!) 148/93 (BP Location: Left Arm, Patient Position: Sitting, Cuff Size: Large)   Pulse (!) 125   Temp 98 F (36.7 C) (Oral)   Ht '5\' 6"'$  (1.676 m)   Wt 169 lb 12.8 oz (77 kg)   LMP 06/20/1997 (Approximate) Comment: post menopausal  BMI 27.41 kg/m   General:   Alert and oriented. Pleasant and cooperative. Well-nourished and well-developed.  Head:  Normocephalic and atraumatic. Eyes:  Without icterus, sclera clear and conjunctiva pink.  Ears:  Normal auditory acuity. Mouth:  No deformity or lesions, oral mucosa pink.  Lungs:  Clear to auscultation bilaterally. No wheezes, rales, or rhonchi. No distress.  Heart:  S1, S2 present without murmurs  appreciated.  Abdomen:  +BS, soft,  non-tender and non-distended. No HSM noted. No guarding or rebound. No masses appreciated.  Rectal:  Deferred  Msk:  Symmetrical without gross deformities. Normal posture. Extremities:  Without edema. Neurologic:  Alert and  oriented x4;  grossly normal neurologically. Skin:  Intact without significant lesions or rashes. Psych:  Alert and cooperative. Normal mood and affect.   Assessment   Aria Mattis is a 72 y.o. female with a history of Afib, COPD, depresssion, HLD, HTN, PE in 1973, GERD, and laryngospasm presenting today with nausea, RUQ pain, change in bowel habits.  RUQ pain, nausea, weight loss: Occasional alcohol use.  Pain usually occurs in the right upper quadrant intermittently and is not constant.  Has also noticed pain in the evenings after eating as well as some nausea.  Suspect that helps her nausea medically from not eating but also will have nausea after eating when she does.  Last few weeks she has noted more frequent burping.  Denies taking any PPI, stop taking it given she does not feel as though she has typical reflux.  No vomiting.  She has had a decreased appetite and reports a 10 pound weight loss in the last 6 weeks.  Continues to admit single dose of BC powder daily for headaches.  Will check HFP and lipase as well as right upper quadrant ultrasound to rule out pancreaticobiliary etiology.  Declines retreatment with PPI, symptoms possibly related to uncontrolled reflux/gastritis.  Will consider offering repeat EGD to further assess in the future if pain continues or worsens.  At this point we also may consider CT A/P.  Constipation, change in bowel habits: Ongoing symptoms of constipation with sitting on the commode for long periods of time and initially is hard to go.  Has frequent straining.  States stools have been larger than usual, typically going every day but different than her typical bowel habits.  Having frequent urge to have a  bowel movement and remain active.  Admits to poor eating habits including eating after 6 PM usually.  Reports she was not drinking enough water.  Advised to begin Colace 100 mg once to twice daily and begin MiraLAX 17 g once nightly.  History of colon polyps: Colonoscopy in January 2017 and 7 polyps removed.  Patient vies repeat in 5 years.  Colonoscopy offered however patient refuses.  She would like to complete Cologuard.  Ordered today per patient's request.  We did discuss possibility of false positives and false negatives as well as given her history of colon polyps that even if Cologuard is negative that we could be missing a potential malignancy, patient indicated understanding and still declines colonoscopy.  PLAN   Cologuard, refuses colonoscopy.  May benefit from repeat EGD for nausea, RUQ pain, weight loss if [place] negative HFP, Lipase RUQ [place] Colace 100 mg once to twice daily Miralax 17 g  once nightly. Follow up in 2 months.     Venetia Night, MSN, FNP-BC, AGACNP-BC [facility]

## 2022-05-03 NOTE — Telephone Encounter (Signed)
[place] scheduled for 05/11/22 at 11:30 AM. Pt to arrive at [facility] 11:15AM. NPO 8 hrs prior. Left detailed message on voicemail, OK per [facility]

## 2022-05-04 ENCOUNTER — Encounter: Payer: Self-pay | Admitting: Gastroenterology

## 2022-05-04 NOTE — Telephone Encounter (Signed)
Attempted to contact pt this morning to make sure she is aware of [place] appt. Left message to return call.

## 2022-05-11 ENCOUNTER — Ambulatory Visit (HOSPITAL_COMMUNITY)
Admission: RE | Admit: 2022-05-11 | Discharge: 2022-05-11 | Disposition: A | Discharge status: Home / Self Care | Payer: 59 | Source: Ambulatory Visit | Attending: Gastroenterology | Admitting: Gastroenterology

## 2022-05-11 DIAGNOSIS — R1011 Right upper quadrant pain: Secondary | ICD-10-CM | POA: Insufficient documentation

## 2022-05-12 ENCOUNTER — Other Ambulatory Visit: Payer: Self-pay | Admitting: Gastroenterology

## 2022-05-12 ENCOUNTER — Other Ambulatory Visit: Payer: Self-pay | Admitting: *Deleted

## 2022-05-12 DIAGNOSIS — R1011 Right upper quadrant pain: Secondary | ICD-10-CM

## 2022-05-12 LAB — LIPASE: Lipase: 22 U/L (ref 14–85)

## 2022-05-12 LAB — HEPATIC FUNCTION PANEL
ALT: 25 IU/L (ref 0–32)
AST: 24 IU/L (ref 0–40)
Albumin: 4.1 g/dL (ref 3.8–4.8)
Alkaline Phosphatase: 102 IU/L (ref 44–121)
Bilirubin Total: <0.2 mg/dL (ref 0.0–1.2)
Bilirubin, Direct: <0.1 mg/dL (ref 0.00–0.40)
Total Protein: 6.6 g/dL (ref 6.0–8.5)

## 2022-05-12 MED ORDER — PANTOPRAZOLE SODIUM 40 MG PO TBEC: 40.0000 mg | DELAYED_RELEASE_TABLET | Freq: Every day | ORAL | 5 refills | Status: DC

## 2022-05-13 ENCOUNTER — Telehealth: Payer: Self-pay | Admitting: *Deleted

## 2022-05-13 NOTE — Telephone Encounter (Signed)
[facility] PA for CT: Case Number: [id number] Review Date: 05/13/2022 7:55:59 AM Expiration Date: N/A Status: This member's benefit plan did not require a prior authorization for this request.

## 2022-05-19 ENCOUNTER — Encounter: Payer: Self-pay | Admitting: *Deleted

## 2022-05-24 ENCOUNTER — Telehealth: Payer: Self-pay | Admitting: *Deleted

## 2022-05-24 NOTE — Telephone Encounter (Signed)
Pt called and states she received cologuard in the mail. She states she can't use it because she has noticed some bleeding from vaginal, rectal area. She has OV with GYN next week. States she will call with an update after OV.

## 2022-05-27 ENCOUNTER — Ambulatory Visit (HOSPITAL_COMMUNITY): Payer: 59

## 2022-05-30 ENCOUNTER — Ambulatory Visit (INDEPENDENT_AMBULATORY_CARE_PROVIDER_SITE_OTHER): Payer: 59 | Admitting: Obstetrics & Gynecology

## 2022-05-30 ENCOUNTER — Ambulatory Visit (INDEPENDENT_AMBULATORY_CARE_PROVIDER_SITE_OTHER): Payer: 59 | Admitting: Adult Health

## 2022-05-30 ENCOUNTER — Encounter: Payer: Self-pay | Admitting: Adult Health

## 2022-05-30 ENCOUNTER — Other Ambulatory Visit (HOSPITAL_COMMUNITY)
Admission: RE | Admit: 2022-05-30 | Discharge: 2022-05-30 | Disposition: A | Discharge status: Home / Self Care | Payer: 59 | Source: Ambulatory Visit | Attending: Adult Health | Admitting: Adult Health

## 2022-05-30 VITALS — BP 158/104 | HR 103 | Ht 66.0 in | Wt 170.0 lb

## 2022-05-30 DIAGNOSIS — D4959 Neoplasm of unspecified behavior of other genitourinary organ: Secondary | ICD-10-CM | POA: Insufficient documentation

## 2022-05-30 DIAGNOSIS — C519 Malignant neoplasm of vulva, unspecified: Secondary | ICD-10-CM | POA: Insufficient documentation

## 2022-05-30 NOTE — Progress Notes (Signed)
Full progress note per Electa Sniff.  Called to bedside due to large irregular appearing vulvar lesion requiring biopsy.  GU: Left vulva extending to perineum- irregular shaped, multi-color both white to gray and black, ~ 3x5cm lesion noted.  PROCEDURE:VULVAR BIOPSY NOTE The indications for vulvar biopsy (rule out neoplasia, establish lichen sclerosus diagnosis) were reviewed.   Risks of the biopsy including pain, bleeding, infection, inadequate specimen, scarring and need for additional procedures  were discussed. The patient stated understanding and agreed to undergo procedure today. Consent was signed,  time out performed.  The patient's vulva was prepped with Betadine. 1% lidocaine was injected.  A 3-mm punch biopsy was done, biopsy tissue was picked up with sterile forceps and sterile scissors were used to excise the lesion.  Small bleeding was noted and hemostasis was achieved using silver nitrate sticks.  The patient tolerated the procedure well. Post-procedure instructions  (pelvic rest for one week) were given to the patient. The patient is to call with heavy bleeding, fever greater than 100.4, foul smelling vaginal discharge or other concerns.   Discussed concern for vulvar neoplasia- next step likely referral to gyn/onc to discuss management.  Janyth Pupa, DO Attending [place], [facility] for [employer], [place]

## 2022-05-30 NOTE — Addendum Note (Signed)
Addended by: Linton Rump on: 05/30/2022 11:38 AM   Modules accepted: Orders

## 2022-05-30 NOTE — Progress Notes (Addendum)
Subjective:     Patient ID: Laura Mcgee, female   DOB: [birth year], 72 y.o.   MRN: [id number]  HPI Laura Mcgee is a 72 year old white female,single, PM in complaining of rough spot near vagina, first noticed a month ago, was itching and has bleed some. She has hard time pushing stool out and is on colace from GI but did not try miralax. Her stomach hurts at times. She says she is seeing cardiologist Wednesday.   PCP is Dr Laura Mcgee  Review of Systems Has rough spot near vaginal opening, had some itching and bleed +constipation and stomach pain at times    Not having sex in over 13 years Reviewed past medical,surgical, social and family history. Reviewed medications and allergies.  Objective:   Physical Exam BP (!) 158/104 (BP Location: Left Arm, Patient Position: Sitting, Cuff Size: Normal)   Pulse (!) 103   Ht 5\' 6"  (1.676 m)   Wt 170 lb (77.1 kg)   LMP 06/20/1997 (Approximate) Comment: post menopausal  BMI 27.44 kg/m  Skin warm and dry. Lungs: clear to ausculation bilaterally. Cardiovascular: regular rate and rhythm.    Pelvic: external genitalia is normal in appearance, has lobulated,irregular, 3 x 5 cm mass at base left labia, concerning of cancer, Dr Laura Mcgee in for co exam and punch biopsy.Consent signed and Dr Laura Mcgee will document biopsy. See her note.  Vagina: pale,urethra has no lesions or masses noted, cervix:smooth, uterus: normal size, shape and contour, mildly tender, no masses felt, adnexa: no masses or tenderness noted. Bladder is non tender and no masses felt. On rectal exam., no masses, +rectocele. AA is 3 Fall risk is high    05/30/2022   10:28 AM  Depression screen PHQ 2/9  Decreased Interest 2  Down, Depressed, Hopeless 0  PHQ - 2 Score 2  Altered sleeping 2  Tired, decreased energy 3  Change in appetite 3  Feeling bad or failure about yourself  0  Trouble concentrating 0  Moving slowly or fidgety/restless 3  Suicidal thoughts 0  PHQ-9 Score 13   She is on  meds     05/30/2022   10:29 AM  GAD 7 : Generalized Anxiety Score  Nervous, Anxious, on Edge 0  Control/stop worrying 0  Worry too much - different things 0  Trouble relaxing 0  Restless 0  Easily annoyed or irritable 3  Afraid - awful might happen 2  Total GAD 7 Score 5    Upstream - 05/30/22 1014       Pregnancy Intention Screening   Does the patient want to become pregnant in the next year? N/A    Does the patient's partner want to become pregnant in the next year? N/A    Would the patient like to discuss contraceptive options today? N/A      Contraception Wrap Up   Current Method Abstinence;No Method - Other Reason   postmenopausal   Reason for No Current Contraceptive Method at Intake ([facility] Only) Other    End Method Abstinence;No Method - Other Reason   postmenopausal   Contraception Counseling Provided No            Examination chaperoned by Laura Pupa LPN    Assessment:      1. Vulva neoplasm Pt noticed rough spot near vagina a month ago, had some itching and bleeding Has lobulated,irregular, 3 x 5 cm mass at base left labia, concerning of cancer, Dr Laura Mcgee in for co exam and punch biopsy. Biopsy sent to path  Plan:     Follow up TBD

## 2022-06-01 ENCOUNTER — Encounter: Payer: Self-pay | Admitting: *Deleted

## 2022-06-01 ENCOUNTER — Other Ambulatory Visit: Payer: Self-pay | Admitting: Obstetrics & Gynecology

## 2022-06-01 ENCOUNTER — Ambulatory Visit (INDEPENDENT_AMBULATORY_CARE_PROVIDER_SITE_OTHER): Payer: 59 | Admitting: Internal Medicine

## 2022-06-01 DIAGNOSIS — D071 Carcinoma in situ of vulva: Secondary | ICD-10-CM

## 2022-06-01 LAB — SURGICAL PATHOLOGY

## 2022-06-01 NOTE — Progress Notes (Signed)
Pt to called with results, referral to [place], DO Attending [place], [facility] for [employer], [place]

## 2022-06-01 NOTE — Progress Notes (Signed)
Erroneous encounter - please disregard.

## 2022-06-02 ENCOUNTER — Telehealth: Payer: Self-pay | Admitting: *Deleted

## 2022-06-02 NOTE — Telephone Encounter (Signed)
Spoke with the patient regarding the referral to GYN oncology. Patient scheduled as new patient with Dr Ernestina Patches on 4/22 at 11:15 am.  Patient given an arrival time of 10:45 am.  Explained to the patient the the doctor will perform a pelvic exam at this visit. Patient given the policy that only one visitor allowed and that visitor must be over 16 yrs are allowed in the [place]. Patient given the address/phone number for the clinic and that the center offers free valet service. Patient aware that masks are option.   Patient offered earlier appt and declined due to her daughter's back surgery this week.

## 2022-06-14 ENCOUNTER — Encounter: Payer: Self-pay | Admitting: Internal Medicine

## 2022-06-14 ENCOUNTER — Encounter: Payer: Self-pay | Admitting: *Deleted

## 2022-06-14 ENCOUNTER — Ambulatory Visit: Payer: 59 | Attending: Internal Medicine | Admitting: Internal Medicine

## 2022-06-14 VITALS — BP 146/88 | HR 104 | Ht 66.0 in | Wt 168.6 lb

## 2022-06-14 DIAGNOSIS — Z01810 Encounter for preprocedural cardiovascular examination: Secondary | ICD-10-CM

## 2022-06-14 DIAGNOSIS — I480 Paroxysmal atrial fibrillation: Secondary | ICD-10-CM | POA: Insufficient documentation

## 2022-06-14 DIAGNOSIS — R0609 Other forms of dyspnea: Secondary | ICD-10-CM

## 2022-06-14 DIAGNOSIS — R000 Tachycardia, unspecified: Secondary | ICD-10-CM

## 2022-06-14 NOTE — Patient Instructions (Addendum)
Medication Instructions:  Your physician recommends that you continue on your current medications as directed. Please refer to the Current Medication list given to you today.  Labwork: none  Testing/Procedures: Your physician has requested that you have a lexiscan myoview. For further information please visit [email or web address]. Please follow instruction sheet, as given.  Follow-Up: Your physician recommends that you schedule a follow-up appointment in: 1 year. You will receive a reminder call in the mail in about 10 months reminding you to call and schedule your appointment. If you don't receive this call, please contact our office.  Any Other Special Instructions Will Be Listed Below (If Applicable).  If you need a refill on your cardiac medications before your next appointment, please call your pharmacy. 

## 2022-06-14 NOTE — Progress Notes (Addendum)
Cardiology Office Note  Date: 06/14/2022   ID: Laura Mcgee, DOB March 02, 1951, MRN [id number]  PCP:  John GiovanniKnowlton, Stephen, MD  Cardiologist:  Dina RichBranch, Jonathan, MD Electrophysiologist:  None   Reason for Office Visit: Preop cardiac risk stratification of right shoulder surgery   History of Present Illness: Laura Mcgee is a 72 y.o. female known to have paroxysmal A-fib, HTN, HLD, COPD, nicotine abuse and alcohol abuse was referred to cardiology clinic for preop cardiac risk stratification of right shoulder surgery.  Patient was admitted to [facility] in 06/2021 with right humeral fracture and new onset of atrial fibrillation with RVR. She was placed on amiodarone drip during hospitalization but currently she is not on any rate controlling agents. She was also not placed on systemic anticoagulation due to active alcohol abuse.  Patient presented today as a new patient visit for preop clearance for right shoulder surgery. Patient is not much active at baseline, walks with a cane, recently feeling tired and short of breath with day-to-day activities. Echocardiogram from 06/2021 showed normal LVEF and no valve abnormalities. She continues to drink alcohol and smoke cigarettes, 1 pack/day.  Past Medical History:  Diagnosis Date   Arthritis    Atrial fibrillation    COPD (chronic obstructive pulmonary disease)    Depression    Difficult intubation    History of laryngospasm   Dysrhythmia    GERD (gastroesophageal reflux disease)    Hyperlipidemia    Hypertension    Numbness and tingling    Pulmonary embolism 1973    Past Surgical History:  Procedure Laterality Date   CERVIX LESION DESTRUCTION     COLONOSCOPY WITH PROPOFOL N/A 04/07/2015   SLF: 1. seven colorectal polyps removed 2. the left colon is redundant 3. small internal hemorrhoids (1 simple adenoma, 6 hyperplastic) . Repeat in 5 years.   CYST EXCISION Left    Arm   ESOPHAGOGASTRODUODENOSCOPY (EGD) WITH PROPOFOL N/A  04/07/2015   SLF: 1. Schatzki ring 2. Bravo capsule 34 cm from the teeth 3. moderate non-erosive gastritis.    HUMERUS IM NAIL Right 06/25/2021   Procedure: INTRAMEDULLARY (IM) NAIL HUMERAL;  Surgeon: Oliver Barreairns, Mark A, MD;  Location: [facility];  Service: Orthopedics;  Laterality: Right;   MASS EXCISION N/A 2002   Lanyx   MASS EXCISION Left 01/21/2015   Procedure: EXCISION CYST LEFT UPPER ARM;  Surgeon: Franky MachoMark Jenkins, MD;  Location: [facility];  Service: General;  Laterality: Left;   MICROLARYNGOSCOPY N/A 05/31/2016   Procedure: MICRO LARYNGOSCOPY WITH BIOPSY OF VOCAL CORD LESION;  Surgeon: Newman PiesSu Teoh, MD;  Location:  SURGERY CENTER;  Service: ENT;  Laterality: N/A;   POLYPECTOMY N/A 04/07/2015   Procedure: POLYPECTOMY;  Surgeon: West BaliSandi L Fields, MD;  [facility];  Service: Endoscopy;  Laterality: N/A;  Descending colon polyps x 3    SAVORY DILATION N/A 04/07/2015   Procedure: SAVORY DILATION;  Surgeon: West BaliSandi L Fields, MD;  [facility]: [facility];  Service: Endoscopy;  Laterality: N/A;    Current Outpatient Medications  Medication Sig Dispense Refill   amLODipine (NORVASC) 5 MG tablet Take 5 mg by mouth daily.     Aspirin-Salicylamide-Caffeine (BC HEADACHE POWDER PO) Take 1 Package by mouth daily as needed (headache or pain).     cholecalciferol (VITAMIN D3) 25 MCG (1000 UNIT) tablet Take 1,000 Units by mouth daily.     doxycycline (VIBRA-TABS) 100 MG tablet Take 100 mg by mouth daily.     DULoxetine (CYMBALTA) 60 MG  capsule Take 1 capsule (60 mg total) by mouth daily. 30 capsule 0   HYDROcodone-acetaminophen (NORCO) 10-325 MG tablet Take 0.5 tablets by mouth 3 (three) times daily as needed. 10 tablet 0   lovastatin (MEVACOR) 20 MG tablet Take 20 mg by mouth daily.     olmesartan (BENICAR) 20 MG tablet Take 20 mg by mouth daily.     ondansetron (ZOFRAN) 4 MG tablet Take 4 mg by mouth 3 (three) times daily as needed.     pantoprazole (PROTONIX) 40 MG tablet Take 1 tablet (40 mg  total) by mouth daily before breakfast. 30 tablet 5   Propylene Glycol (SYSTANE COMPLETE OP) Apply to eye.     venlafaxine XR (EFFEXOR-XR) 150 MG 24 hr capsule Take 2 capsules (300 mg total) by mouth every evening. (Patient taking differently: Take 300 mg by mouth in the morning and at bedtime.) 60 capsule 0   No current facility-administered medications for this visit.   Allergies:  Gabapentin and Tetracyclines & related   Social History: The patient  reports that she has been smoking cigarettes. She has a 25.00 pack-year smoking history. She has been exposed to tobacco smoke. She has never used smokeless tobacco. She reports current alcohol use. She reports current drug use. Drug: Marijuana.   Family History: The patient's family history includes Aneurysm in her father; Cancer in her sister; Diabetes in her daughter; Melanoma in her maternal grandfather; Pneumonia in her mother.   ROS:  Please see the history of present illness. Otherwise, complete review of systems is positive for none.  All other systems are reviewed and negative.   Physical Exam: VS:  BP (!) 146/88   Pulse (!) 104   Ht 5\' 6"  (1.676 m)   Wt 168 lb 9.6 oz (76.5 kg)   LMP 06/20/1997 (Approximate) Comment: post menopausal  SpO2 96%   BMI 27.21 kg/m , BMI Body mass index is 27.21 kg/m.  Wt Readings from Last 3 Encounters:  06/14/22 168 lb 9.6 oz (76.5 kg)  05/30/22 170 lb (77.1 kg)  05/03/22 169 lb 12.8 oz (77 kg)    General: Patient appears comfortable at rest. HEENT: Conjunctiva and lids normal, oropharynx clear with moist mucosa. Neck: Supple, no elevated JVP or carotid bruits, no thyromegaly. Lungs: Clear to auscultation, nonlabored breathing at rest. Cardiac: Regular rate and rhythm, no S3 or significant systolic murmur, no pericardial rub. Abdomen: Soft, nontender, no hepatomegaly, bowel sounds present, no guarding or rebound. Extremities: No pitting edema, distal pulses 2+. Skin: Warm and  dry. Musculoskeletal: No kyphosis. Neuropsychiatric: Alert and oriented x3, affect grossly appropriate.  ECG:  An ECG dated 06/14/2022 was personally reviewed today and demonstrated:  Normal sinus rhythm, no ST changes  Recent Labwork: 06/26/2021: TSH 0.169 06/27/2021: Magnesium 1.9 04/01/2022: BUN 16; Creatinine, Ser 0.74; Hemoglobin 16.7; Platelets 285; Potassium 4.0; Sodium 135 05/11/2022: ALT 25; AST 24     Component Value Date/Time   CHOL 186 06/06/2018 0000   TRIG 100 06/06/2018 0000   HDL 54 06/06/2018 0000   LDLCALC 100 06/06/2018 0000    Other Studies Reviewed Today:   Assessment and Plan: Patient is a 72 year old F known to have HTN, HLD, COPD, nicotine/alcohol abuse was referred to cardiology clinic for preop cardiac risk stratification of right shoulder surgery.  Addendum on 06/29/22 # Preop cardiac risk stratification of right shoulder surgery -METs less than 4. EKG showed normal sinus rhythm and no ST changes.  Echocardiogram showed normal LVEF and no valve abnormalities. Due  to METs less than 4 and significant risk factors of HTN, alcohol use and longstanding smoking history, she will benefit from obtaining pharmacological NM stress test. NM stress test showed ischemia in apex, but study is low risk. Patient is at a moderate risk for any perioperative cardiac complications and no further testing is indicated prior to proceeding with the planned procedure. # Paroxysmal A-fib: Currently not on rate controlling agents or systemic anticoagulation.  I do not interested candidate for systemic anticoagulation due to active alcohol use. I instructed her to quit alcohol use which is a significant risk factor for atrial fibrillation. She seems motivated to quit. # HTN: Continue amlodipine 5 mg once daily, olmesartan 20 mg once daily, HTN per PCP # Nicotine abuse: Currently smokes 1 pack/day, motivated to quit, smoking cessation counseling provided, and educated to cut down cigarettes.  Smoking cessation instruction/counseling given:  counseled patient on the dangers of tobacco use, advised patient to stop smoking, and reviewed strategies to maximize success   I have spent a total of 45 minutes with patient reviewing chart, EKGs, labs and examining patient as well as establishing an assessment and plan that was discussed with the patient.  > 50% of time was spent in direct patient care.     Medication Adjustments/Labs and Tests Ordered: Current medicines are reviewed at length with the patient today.  Concerns regarding medicines are outlined above.   Tests Ordered: Orders Placed This Encounter  Procedures   EKG 12-Lead    Medication Changes: No orders of the defined types were placed in this encounter.   Disposition:  Follow up  1 year  Signed Franki Alcaide Verne Spurr, MD, 06/14/2022 12:00 PM    [facility] at [facility] [address] [place], [place], [place] [address]

## 2022-06-15 ENCOUNTER — Encounter: Payer: Self-pay | Admitting: Psychiatry

## 2022-06-20 ENCOUNTER — Inpatient Hospital Stay: Payer: 59 | Attending: Psychiatry | Admitting: Psychiatry

## 2022-06-20 ENCOUNTER — Other Ambulatory Visit: Payer: Self-pay

## 2022-06-20 ENCOUNTER — Inpatient Hospital Stay: Payer: 59 | Admitting: Psychiatry

## 2022-06-20 ENCOUNTER — Other Ambulatory Visit (HOSPITAL_COMMUNITY)
Admission: RE | Admit: 2022-06-20 | Discharge: 2022-06-20 | Disposition: A | Discharge status: Home / Self Care | Payer: 59 | Source: Ambulatory Visit

## 2022-06-20 ENCOUNTER — Encounter: Payer: Self-pay | Admitting: Psychiatry

## 2022-06-20 VITALS — BP 159/89 | HR 80 | Temp 97.5°F | Ht 66.0 in | Wt 161.0 lb

## 2022-06-20 DIAGNOSIS — Z01419 Encounter for gynecological examination (general) (routine) without abnormal findings: Secondary | ICD-10-CM | POA: Insufficient documentation

## 2022-06-20 DIAGNOSIS — E7800 Pure hypercholesterolemia, unspecified: Secondary | ICD-10-CM | POA: Insufficient documentation

## 2022-06-20 DIAGNOSIS — R630 Anorexia: Secondary | ICD-10-CM | POA: Diagnosis not present

## 2022-06-20 DIAGNOSIS — Z86711 Personal history of pulmonary embolism: Secondary | ICD-10-CM | POA: Insufficient documentation

## 2022-06-20 DIAGNOSIS — Z79899 Other long term (current) drug therapy: Secondary | ICD-10-CM | POA: Insufficient documentation

## 2022-06-20 DIAGNOSIS — R6881 Early satiety: Secondary | ICD-10-CM | POA: Insufficient documentation

## 2022-06-20 DIAGNOSIS — Z1151 Encounter for screening for human papillomavirus (HPV): Secondary | ICD-10-CM | POA: Diagnosis not present

## 2022-06-20 DIAGNOSIS — I700 Atherosclerosis of aorta: Secondary | ICD-10-CM | POA: Diagnosis not present

## 2022-06-20 DIAGNOSIS — I10 Essential (primary) hypertension: Secondary | ICD-10-CM | POA: Diagnosis not present

## 2022-06-20 DIAGNOSIS — D071 Carcinoma in situ of vulva: Secondary | ICD-10-CM

## 2022-06-20 DIAGNOSIS — F17210 Nicotine dependence, cigarettes, uncomplicated: Secondary | ICD-10-CM | POA: Diagnosis not present

## 2022-06-20 DIAGNOSIS — Z124 Encounter for screening for malignant neoplasm of cervix: Secondary | ICD-10-CM

## 2022-06-20 DIAGNOSIS — M1990 Unspecified osteoarthritis, unspecified site: Secondary | ICD-10-CM | POA: Diagnosis not present

## 2022-06-20 DIAGNOSIS — K219 Gastro-esophageal reflux disease without esophagitis: Secondary | ICD-10-CM | POA: Diagnosis not present

## 2022-06-20 DIAGNOSIS — F32A Depression, unspecified: Secondary | ICD-10-CM | POA: Insufficient documentation

## 2022-06-20 DIAGNOSIS — J449 Chronic obstructive pulmonary disease, unspecified: Secondary | ICD-10-CM | POA: Diagnosis not present

## 2022-06-20 DIAGNOSIS — R634 Abnormal weight loss: Secondary | ICD-10-CM | POA: Insufficient documentation

## 2022-06-20 DIAGNOSIS — I480 Paroxysmal atrial fibrillation: Secondary | ICD-10-CM

## 2022-06-20 NOTE — Progress Notes (Unsigned)
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  We reviewed the nature of vulvar dysplasia. Surgical excision is the mainstay of treatment, but ablative therapy or pharmacologic treatment is an option for some patients in certain clinical scenarios. The goals of treatment of vulvar dysplasia are to prevent development of vulvar squamous carcinoma and relieve any associated symptoms, such as pain or itching. The goal is also preserve vulvar anatomy as best as possible.  Excision provides both treatment and a diagnostic specimen for those women with high-grade dysplasia. Invasive squamous cell carcinoma is present at the time of excision in 10-22% of women with VIN on initial biopsy.    She has elected to proceed with surgical excision.  Discussed that based on the location on the posterior vulva, she may require a rotational flap for coverage of resection area.  Repeat biopsy obtained today to eval for any evidence of invasive carcinoma prior to surgery. Also pap smear collected today as pt believes her last pap may have been in the 1990s.  Patient was consented for: simple partial vulvectomy, possible rotational flap on 07/12/22.  The risks of surgery were discussed in detail and she understands these to including but not limited to bleeding requiring a blood transfusion, infection, injury to adjacent organs (including but not limited to the anus, vagina, urethra, nearby vessels and nerves), wound separation, unforseen complication, possible need for re-exploration, and medical complications such as heart attack, stroke, pneumonia.  If the patient experiences any of these events, she understands that her hospitalization or recovery may be prolonged and that she may need to take additional medications for a prolonged period. The patient will  receive DVT and antibiotic prophylaxis as indicated. She voiced a clear understanding. She had the opportunity to ask questions and informed consent was obtained today. She wishes to proceed.  Pt has a NM stress test on 4/22. She will require cardiac clearance following completion of the stress test.   All preoperative instructions were reviewed. Postoperative expectations were also reviewed. Written handouts were provided to the patient.   A copy of this note was sent to the patient's referring provider.  Clide Cliff, MD Gynecologic Oncology   Medical Decision Making I personally spent  TOTAL 50 minutes face-to-face and non-face-to-face in the care of this patient, which includes all pre, intra, and post visit time on the date of service.   ------------  CC: VIN3  HISTORY OF PRESENT ILLNESS:  Laura Mcgee is a 72 y.o. woman who is seen in consultation at the request of Cyril Mourning, NP for evaluation of VIN3.  Patient was seen by her OB/GYN on 05/30/2022 at which time she noted a rough spot near her vagina.  She had first noticed it a month prior with some itching and bleeding.  On exam she was noted to have a lobulated irregular 3 x 5 cm mass at the base of the labia concerning for cancer.  Dr. Charlotta Karrine Kluttz also came to evaluate and performed a biopsy of this lesion.  Biopsy results returned with VIN 3.  Today pt presents with there daughter. She notes an unintentional weight loss of about 30lbs in the past 6 months. She attributes this to having no appetite. She also reports that she has a daily bowel movement but very firm and hard to pass. She recently started taking colace and this has helped.  She also reports early satiety over a similar time frame. She denies abdominal bloating, change in bladder habits.   Pt currently smokes about 1 ppd. She reports that she has tried to cut back in the past on her own but has not used nicotine replacement or medication aides.   She was  recently seen by cardiology on 06/14/22 for preoperative evaluation for a right shoulder surgery. She is scheduled for a NM stress test on 4/22.     PAST MEDICAL HISTORY: Past Medical History:  Diagnosis Date   Arthritis    Atrial fibrillation    COPD (chronic obstructive pulmonary disease)    Depression    Difficult intubation    History of laryngospasm   Dysrhythmia    GERD (gastroesophageal reflux disease)    Hyperlipidemia    Hypertension    Numbness and tingling    Pulmonary embolism 1973    PAST SURGICAL HISTORY: Past Surgical History:  Procedure Laterality Date   CERVIX LESION DESTRUCTION     throat   COLONOSCOPY WITH PROPOFOL N/A 04/07/2015   SLF: 1. seven colorectal polyps removed 2. the left colon is redundant 3. small internal hemorrhoids (1 simple adenoma, 6 hyperplastic) . Repeat in 5 years.   CYST EXCISION Left    Arm   ESOPHAGOGASTRODUODENOSCOPY (EGD) WITH PROPOFOL N/A 04/07/2015   SLF: 1. Schatzki ring 2. Bravo capsule 34 cm from the teeth 3. moderate non-erosive gastritis.    HUMERUS IM NAIL Right 06/25/2021   Procedure: INTRAMEDULLARY (IM) NAIL HUMERAL;  Surgeon: Oliver Barre, MD;  Location: [facility];  Service: Orthopedics;  Laterality: Right;   MASS EXCISION N/A 2002   Lanyx   MASS EXCISION Left 01/21/2015   Procedure: EXCISION CYST LEFT UPPER ARM;  Surgeon: Franky Macho, MD;  Location: [facility];  Service: General;  Laterality: Left;   MICROLARYNGOSCOPY N/A 05/31/2016   Procedure: MICRO LARYNGOSCOPY WITH BIOPSY OF VOCAL CORD LESION;  Surgeon: Newman Pies, MD;  Location: [facility];  Service: ENT;  Laterality: N/A;   POLYPECTOMY N/A 04/07/2015   Procedure: POLYPECTOMY;  Surgeon: West Bali, MD;  [facility];  Service: Endoscopy;  Laterality: N/A;  Descending colon polyps x 3    SAVORY DILATION N/A 04/07/2015   Procedure: SAVORY DILATION;  Surgeon: West Bali, MD;  [facility];  Service: Endoscopy;  Laterality: N/A;     OB/GYN HISTORY: OB History  Gravida Para Term Preterm AB Living  2 2 1 1  0 1  SAB IAB Ectopic Multiple Live Births  0       2    # Outcome Date GA Lbr Len/2nd Weight Sex Delivery Anes PTL Lv  2 Term 03/29/69    F Vag-Spont   LIV  1 Preterm         ND      Age at menarche: 66 Age at menopause: 29-50 Hx of HRT:  use HRT patch for about 10 years; d/c'ed years ago Hx of STI: No Last pap: Unclear based on records History of abnormal pap smears: Denies  SCREENING STUDIES:  Last mammogram: 2018 Last colonoscopy: 2018  MEDICATIONS:  Current Outpatient Medications:    amLODipine (NORVASC) 5 MG tablet, Take 5 mg by mouth daily., Disp: , Rfl:    Aspirin-Salicylamide-Caffeine (BC HEADACHE POWDER PO), Take 1 Package by mouth daily as needed (headache or pain)., Disp: , Rfl:    cholecalciferol (VITAMIN D3) 25 MCG (1000 UNIT) tablet, Take 1,000 Units by mouth  daily., Disp: , Rfl:    doxycycline (VIBRA-TABS) 100 MG tablet, Take 100 mg by mouth daily., Disp: , Rfl:    DULoxetine (CYMBALTA) 60 MG capsule, Take 1 capsule (60 mg total) by mouth daily., Disp: 30 capsule, Rfl: 0   HYDROcodone-acetaminophen (NORCO) 10-325 MG tablet, Take 0.5 tablets by mouth 3 (three) times daily as needed., Disp: 10 tablet, Rfl: 0   lovastatin (MEVACOR) 20 MG tablet, Take 20 mg by mouth daily., Disp: , Rfl:    olmesartan (BENICAR) 20 MG tablet, Take 20 mg by mouth daily., Disp: , Rfl:    ondansetron (ZOFRAN) 4 MG tablet, Take 4 mg by mouth 3 (three) times daily as needed., Disp: , Rfl:    pantoprazole (PROTONIX) 40 MG tablet, Take 1 tablet (40 mg total) by mouth daily before breakfast., Disp: 30 tablet, Rfl: 5   Propylene Glycol (SYSTANE COMPLETE OP), Apply to eye., Disp: , Rfl:    venlafaxine XR (EFFEXOR-XR) 150 MG 24 hr capsule, Take 2 capsules (300 mg total) by mouth every evening. (Patient taking differently: Take 300 mg by mouth in the morning and at bedtime.), Disp: 60 capsule, Rfl:  0  ALLERGIES: Allergies  Allergen Reactions   Gabapentin Other (See Comments)    dizziness   Tetracyclines & Related Other (See Comments)    Makes her sick on her stomach     FAMILY HISTORY: Family History  Problem Relation Age of Onset   Pneumonia Mother    Aneurysm Father    Kidney cancer Sister    Lung cancer Sister    Esophageal cancer Brother    Melanoma Maternal Grandfather    Diabetes Daughter    Colon cancer Neg Hx    Breast cancer Neg Hx    Ovarian cancer Neg Hx    Endometrial cancer Neg Hx    Pancreatic cancer Neg Hx    Prostate cancer Neg Hx     SOCIAL HISTORY: Social History   Socioeconomic History   Marital status: Single    Spouse name: Not on file   Number of children: 1   Years of education: some college   Highest education level: Not on file  Occupational History   Occupation: Retired  Tobacco Use   Smoking status: Every Day    Packs/day: 1.00    Years: 25.00    Additional pack years: 0.00    Total pack years: 25.00    Types: Cigarettes    Passive exposure: Current   Smokeless tobacco: Never  Vaping Use   Vaping Use: Never used  Substance and Sexual Activity   Alcohol use: Yes    Comment: Drinks two beer per night.   Drug use: Yes    Types: Marijuana    Comment: daily marijuana use   Sexual activity: Not Currently    Birth control/protection: Post-menopausal  Other Topics Concern   Not on file  Social History Narrative   Lives at home alone.   Right-handed.   Two cups sweet tea daily.   Social Determinants of Health   Financial Resource Strain: Low Risk  (05/30/2022)   Overall Financial Resource Strain (CARDIA)    Difficulty of Paying Living Expenses: Not hard at all  Food Insecurity: No Food Insecurity (05/30/2022)   Hunger Vital Sign    Worried About Running Out of Food in the Last Year: Never true    Ran Out of Food in the Last Year: Never true  Transportation Needs: No Transportation Needs (05/30/2022)   PRAPARE -  Administrator, Civil Service (Medical): No    Lack of Transportation (Non-Medical): No  Physical Activity: Inactive (05/30/2022)   Exercise Vital Sign    Days of Exercise per Week: 0 days    Minutes of Exercise per Session: 0 min  Stress: No Stress Concern Present (05/30/2022)   [employer] of Occupational Health - Occupational Stress Questionnaire    Feeling of Stress : Not at all  Social Connections: Socially Isolated (05/30/2022)   Social Connection and Isolation Panel [NHANES]    Frequency of Communication with Friends and Family: Once a week    Frequency of Social Gatherings with Friends and Family: Never    Attends Religious Services: Never    Database administrator or Organizations: No    Attends Banker Meetings: Never    Marital Status: Divorced  Catering manager Violence: Not At Risk (05/30/2022)   Humiliation, Afraid, Rape, and Kick questionnaire    Fear of Current or Ex-Partner: No    Emotionally Abused: No    Physically Abused: No    Sexually Abused: No    REVIEW OF SYSTEMS: New patient intake form was reviewed.  Complete 10-system review is negative except for the following: Change in appetite, shortness of breath, palpitations, urinary frequency, joint pain, easy bruising, back pain, decreased concentration, fatigue, chest pain, abdominal pain, incontinence, muscle pain/cramps, dizziness, unexplained weight loss, gait problem  PHYSICAL EXAM: BP (!) 159/89 (BP Location: Left Arm, Patient Position: Sitting)   Pulse 80   Temp (!) 97.5 F (36.4 C) (Oral)   Ht  (1.676 m)   Wt 161 lb (73 kg)   LMP 06/20/1997 (Approximate) Comment: post menopausal  SpO2 97%   BMI 25.99 kg/m  Constitutional: No acute distress. Neuro/Psych: Alert, oriented.  Head and Neck: Normocephalic, atraumatic. Neck symmetric without masses. Sclera anicteric.  Respiratory: Normal work of breathing. Overall clear to auscultation bilaterally with distant lung  sounds. Cardiovascular: Regular rate and rhythm, no murmurs, rubs, or gallops. Abdomen: Normoactive bowel sounds. Soft, non-distended, non-tender to palpation. No masses.  Extremities: Grossly normal range of motion. Warm, well perfused. No edema bilaterally. Skin: No rashes or lesions. Lymphatic: No cervical, supraclavicular, or inguinal adenopathy. Genitourinary: External genitalia with veruccous lesion of the posterior vulva in the midline, extending to the left and onto the perineum. Urethral meatus without lesions or prolapse. On speculum exam, vagina and cervix without lesions. Pap smear collected. Bimanual exam reveals normal cervix and uterus. Exam chaperoned by Warner Mccreedy, NP    VULVAR COLPOSCOPY PROCEDURE NOTE  Procedure Details: After appropriate verbal informed consent was obtained, a timeout was performed. Acetic acid was applied to the vulva and the vulva was inspected with the colposcope with the findings as noted below. The vulva was then cleansed of the acetic acid with water. The patient tolerated the procedure well.   Adequate Exam: Yes  Biopsy Specimen: 3mm punch biopsy of posterior vulva  Condition: Stable. Patient tolerated procedure well.  Complications: None  Findings: Acetowhite changes of posterior vulva on the perineum. Thickest area at around 6 o'clock in lesion. Approaches the anus but does not directly involve the anus.  Procedure: The procedure was explained to the patient and verbal consent was obtained prior to the procedure.  The skin was cleaned with Betadine.  1 ml of 1% lidocaine was injected at the planned biopsy site.  A 3 mm punch biopsy was used to obtain the biopsy specimen.  Hemostasis was achieved with silver nitrate.  Patient tolerated the procedure well.  Colposcopic Impression: VIN3   LABORATORY AND RADIOLOGIC DATA: Outside medical records were reviewed to synthesize the above history, along with the history and physical obtained during  the visit.  Outside laboratory, pathology reports were reviewed, with pertinent results below.   WBC  Date Value Ref Range Status  04/01/2022 11.6 (H) 4.0 - 10.5 K/uL Final   Hemoglobin  Date Value Ref Range Status  04/01/2022 16.7 (H) 12.0 - 15.0 g/dL Final   HCT  Date Value Ref Range Status  04/01/2022 49.1 (H) 36.0 - 46.0 % Final   Platelets  Date Value Ref Range Status  04/01/2022 285 150 - 400 K/uL Final   Magnesium  Date Value Ref Range Status  06/27/2021 1.9 1.7 - 2.4 mg/dL Final    Comment:    Performed at [facility], [address] [place], [place] [address]   Creat  Date Value Ref Range Status  01/14/2019 0.77 0.50 - 0.99 mg/dL Final    Comment:    For patients >91 years of age, the reference limit for Creatinine is approximately 13% higher for people identified as African-American. .    Creatinine, Ser  Date Value Ref Range Status  04/01/2022 0.74 0.44 - 1.00 mg/dL Final   AST  Date Value Ref Range Status  05/11/2022 24 0 - 40 IU/L Final   ALT  Date Value Ref Range Status  05/11/2022 25 0 - 32 IU/L Final    FINAL MICROSCOPIC DIAGNOSIS:   A. VULVA, BIOPSY:       High-grade intraepithelial lesion (HSIL / VIN 3).       See comment.   COMMENT:   Immunohistochemical stains for p16 and p53 were performed. P16 shows  block like pattern of staining. P53 is wild type of staining. The  findings are supportive of a HPV-dependent p53- wild type VIN3.

## 2022-06-20 NOTE — Patient Instructions (Signed)
It was a pleasure to see you in clinic today. - We will see what your biopsy returns as. - Return visit planned for 4/29 for a preop visit. Tentatively may have surgery scheduled for 5/7.  Thank you very much for allowing me to provide care for you today.  I appreciate your confidence in choosing our Gynecologic Oncology team at [facility].  If you have any questions about your visit today please call our office or send [place] a MyChart message and we will get back to you as soon as possible.

## 2022-06-20 NOTE — H&P (View-Only) (Signed)
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  We reviewed the nature of vulvar dysplasia. Surgical excision is the mainstay of treatment, but ablative therapy or pharmacologic treatment is an option for some patients in certain clinical scenarios. The goals of treatment of vulvar dysplasia are to prevent development of vulvar squamous carcinoma and relieve any associated symptoms, such as pain or itching. The goal is also preserve vulvar anatomy as best as possible.  Excision provides both treatment and a diagnostic specimen for those women with high-grade dysplasia. Invasive squamous cell carcinoma is present at the time of excision in 10-22% of women with VIN on initial biopsy.    She has elected to proceed with surgical excision.  Discussed that based on the location on the posterior vulva, she may require a rotational flap for coverage of resection area.  Repeat biopsy obtained today to eval for any evidence of invasive carcinoma prior to surgery. Also pap smear collected today as pt believes her last pap may have been in the 1990s.  Patient was consented for: simple partial vulvectomy, possible rotational flap on 07/12/22.  The risks of surgery were discussed in detail and she understands these to including but not limited to bleeding requiring a blood transfusion, infection, injury to adjacent organs (including but not limited to the anus, vagina, urethra, nearby vessels and nerves), wound separation, unforseen complication, possible need for re-exploration, and medical complications such as heart attack, stroke, pneumonia.  If the patient experiences any of these events, she understands that her hospitalization or recovery may be prolonged and that she may need to take additional medications for a prolonged period. The patient will  receive DVT and antibiotic prophylaxis as indicated. She voiced a clear understanding. She had the opportunity to ask questions and informed consent was obtained today. She wishes to proceed.  Pt has a NM stress test on 4/22. She will require cardiac clearance following completion of the stress test.   All preoperative instructions were reviewed. Postoperative expectations were also reviewed. Written handouts were provided to the patient.   A copy of this note was sent to the patient's referring provider.  Devone Tousley, MD Gynecologic Oncology   Medical Decision Making I personally spent  TOTAL 50 minutes face-to-face and non-face-to-face in the care of this patient, which includes all pre, intra, and post visit time on the date of service.   ------------  CC: VIN3  HISTORY OF PRESENT ILLNESS:  Laura Mcgee is a 71 y.o. woman who is seen in consultation at the request of Jennifer Griffin, NP for evaluation of VIN3.  Patient was seen by her OB/GYN on 05/30/2022 at which time she noted a rough spot near her vagina.  She had first noticed it a month prior with some itching and bleeding.  On exam she was noted to have a lobulated irregular 3 x 5 cm mass at the base of the labia concerning for cancer.  Dr. Ozan also came to evaluate and performed a biopsy of this lesion.  Biopsy results returned with VIN 3.  Today pt presents with there daughter. She notes an unintentional weight loss of about 30lbs in the past 6 months. She attributes this to having no appetite. She also reports that she has a daily bowel movement but very firm and hard to pass. She recently started taking colace and this has helped.   She also reports early satiety over a similar time frame. She denies abdominal bloating, change in bladder habits.   Pt currently smokes about 1 ppd. She reports that she has tried to cut back in the past on her own but has not used nicotine replacement or medication aides.   She was  recently seen by cardiology on 06/14/22 for preoperative evaluation for a right shoulder surgery. She is scheduled for a NM stress test on 4/22.     PAST MEDICAL HISTORY: Past Medical History:  Diagnosis Date   Arthritis    Atrial fibrillation    COPD (chronic obstructive pulmonary disease)    Depression    Difficult intubation    History of laryngospasm   Dysrhythmia    GERD (gastroesophageal reflux disease)    Hyperlipidemia    Hypertension    Numbness and tingling    Pulmonary embolism 1973    PAST SURGICAL HISTORY: Past Surgical History:  Procedure Laterality Date   CERVIX LESION DESTRUCTION     throat   COLONOSCOPY WITH PROPOFOL N/A 04/07/2015   SLF: 1. seven colorectal polyps removed 2. the left colon is redundant 3. small internal hemorrhoids (1 simple adenoma, 6 hyperplastic) . Repeat in 5 years.   CYST EXCISION Left    Arm   ESOPHAGOGASTRODUODENOSCOPY (EGD) WITH PROPOFOL N/A 04/07/2015   SLF: 1. Schatzki ring 2. Bravo capsule 34 cm from the teeth 3. moderate non-erosive gastritis.    HUMERUS IM NAIL Right 06/25/2021   Procedure: INTRAMEDULLARY (IM) NAIL HUMERAL;  Surgeon: Cairns, Mark A, MD;  Location: [facility];  Service: Orthopedics;  Laterality: Right;   MASS EXCISION N/A 2002   Lanyx   MASS EXCISION Left 01/21/2015   Procedure: EXCISION CYST LEFT UPPER ARM;  Surgeon: Mark Jenkins, MD;  Location: [facility];  Service: General;  Laterality: Left;   MICROLARYNGOSCOPY N/A 05/31/2016   Procedure: MICRO LARYNGOSCOPY WITH BIOPSY OF VOCAL CORD LESION;  Surgeon: Su Teoh, MD;  Location: [facility];  Service: ENT;  Laterality: N/A;   POLYPECTOMY N/A 04/07/2015   Procedure: POLYPECTOMY;  Surgeon: Sandi L Fields, MD;  Location: [facility];  Service: Endoscopy;  Laterality: N/A;  Descending colon polyps x 3    SAVORY DILATION N/A 04/07/2015   Procedure: SAVORY DILATION;  Surgeon: Sandi L Fields, MD;  Location: [facility];  Service: Endoscopy;  Laterality: N/A;     OB/GYN HISTORY: OB History  Gravida Para Term Preterm AB Living  2 2 1 1 0 1  SAB IAB Ectopic Multiple Live Births  0       2    # Outcome Date GA Lbr Len/2nd Weight Sex Delivery Anes PTL Lv  2 Term 03/29/69    F Vag-Spont   LIV  1 Preterm         ND      Age at menarche: 12 Age at menopause: 45-49 Hx of HRT:  use HRT patch for about 10 years; d/c'ed years ago Hx of STI: No Last pap: Unclear based on records History of abnormal pap smears: Denies  SCREENING STUDIES:  Last mammogram: 2018 Last colonoscopy: 2018  MEDICATIONS:  Current Outpatient Medications:    amLODipine (NORVASC) 5 MG tablet, Take 5 mg by mouth daily., Disp: , Rfl:    Aspirin-Salicylamide-Caffeine (BC HEADACHE POWDER PO), Take 1 Package by mouth daily as needed (headache or pain)., Disp: , Rfl:    cholecalciferol (VITAMIN D3) 25 MCG (1000 UNIT) tablet, Take 1,000 Units by mouth   daily., Disp: , Rfl:    doxycycline (VIBRA-TABS) 100 MG tablet, Take 100 mg by mouth daily., Disp: , Rfl:    DULoxetine (CYMBALTA) 60 MG capsule, Take 1 capsule (60 mg total) by mouth daily., Disp: 30 capsule, Rfl: 0   HYDROcodone-acetaminophen (NORCO) 10-325 MG tablet, Take 0.5 tablets by mouth 3 (three) times daily as needed., Disp: 10 tablet, Rfl: 0   lovastatin (MEVACOR) 20 MG tablet, Take 20 mg by mouth daily., Disp: , Rfl:    olmesartan (BENICAR) 20 MG tablet, Take 20 mg by mouth daily., Disp: , Rfl:    ondansetron (ZOFRAN) 4 MG tablet, Take 4 mg by mouth 3 (three) times daily as needed., Disp: , Rfl:    pantoprazole (PROTONIX) 40 MG tablet, Take 1 tablet (40 mg total) by mouth daily before breakfast., Disp: 30 tablet, Rfl: 5   Propylene Glycol (SYSTANE COMPLETE OP), Apply to eye., Disp: , Rfl:    venlafaxine XR (EFFEXOR-XR) 150 MG 24 hr capsule, Take 2 capsules (300 mg total) by mouth every evening. (Patient taking differently: Take 300 mg by mouth in the morning and at bedtime.), Disp: 60 capsule, Rfl:  0  ALLERGIES: Allergies  Allergen Reactions   Gabapentin Other (See Comments)    dizziness   Tetracyclines & Related Other (See Comments)    Makes her sick on her stomach     FAMILY HISTORY: Family History  Problem Relation Age of Onset   Pneumonia Mother    Aneurysm Father    Kidney cancer Sister    Lung cancer Sister    Esophageal cancer Brother    Melanoma Maternal Grandfather    Diabetes Daughter    Colon cancer Neg Hx    Breast cancer Neg Hx    Ovarian cancer Neg Hx    Endometrial cancer Neg Hx    Pancreatic cancer Neg Hx    Prostate cancer Neg Hx     SOCIAL HISTORY: Social History   Socioeconomic History   Marital status: Single    Spouse name: Not on file   Number of children: 1   Years of education: some college   Highest education level: Not on file  Occupational History   Occupation: Retired  Tobacco Use   Smoking status: Every Day    Packs/day: 1.00    Years: 25.00    Additional pack years: 0.00    Total pack years: 25.00    Types: Cigarettes    Passive exposure: Current   Smokeless tobacco: Never  Vaping Use   Vaping Use: Never used  Substance and Sexual Activity   Alcohol use: Yes    Comment: Drinks two beer per night.   Drug use: Yes    Types: Marijuana    Comment: daily marijuana use   Sexual activity: Not Currently    Birth control/protection: Post-menopausal  Other Topics Concern   Not on file  Social History Narrative   Lives at home alone.   Right-handed.   Two cups sweet tea daily.   Social Determinants of Health   Financial Resource Strain: Low Risk  (05/30/2022)   Overall Financial Resource Strain (CARDIA)    Difficulty of Paying Living Expenses: Not hard at all  Food Insecurity: No Food Insecurity (05/30/2022)   Hunger Vital Sign    Worried About Running Out of Food in the Last Year: Never true    Ran Out of Food in the Last Year: Never true  Transportation Needs: No Transportation Needs (05/30/2022)   PRAPARE -    Transportation    Lack of Transportation (Medical): No    Lack of Transportation (Non-Medical): No  Physical Activity: Inactive (05/30/2022)   Exercise Vital Sign    Days of Exercise per Week: 0 days    Minutes of Exercise per Session: 0 min  Stress: No Stress Concern Present (05/30/2022)   [employer] - Occupational Stress Questionnaire    Feeling of Stress : Not at all  Social Connections: Socially Isolated (05/30/2022)   Social Connection and Isolation Panel [NHANES]    Frequency of Communication with Friends and Family: Once a week    Frequency of Social Gatherings with Friends and Family: Never    Attends Religious Services: Never    Active Member of Clubs or Organizations: No    Attends Club or Organization Meetings: Never    Marital Status: Divorced  Intimate Partner Violence: Not At Risk (05/30/2022)   Humiliation, Afraid, Rape, and Kick questionnaire    Fear of Current or Ex-Partner: No    Emotionally Abused: No    Physically Abused: No    Sexually Abused: No    REVIEW OF SYSTEMS: New patient intake form was reviewed.  Complete 10-system review is negative except for the following: Change in appetite, shortness of breath, palpitations, urinary frequency, joint pain, easy bruising, back pain, decreased concentration, fatigue, chest pain, abdominal pain, incontinence, muscle pain/cramps, dizziness, unexplained weight loss, gait problem  PHYSICAL EXAM: BP (!) 159/89 (BP Location: Left Arm, Patient Position: Sitting)   Pulse 80   Temp (!) 97.5 F (36.4 C) (Oral)   Ht 5' 6" (1.676 m)   Wt 161 lb (73 kg)   LMP 06/20/1997 (Approximate) Comment: post menopausal  SpO2 97%   BMI 25.99 kg/m  Constitutional: No acute distress. Neuro/Psych: Alert, oriented.  Head and Neck: Normocephalic, atraumatic. Neck symmetric without masses. Sclera anicteric.  Respiratory: Normal work of breathing. Overall clear to auscultation bilaterally with distant lung  sounds. Cardiovascular: Regular rate and rhythm, no murmurs, rubs, or gallops. Abdomen: Normoactive bowel sounds. Soft, non-distended, non-tender to palpation. No masses.  Extremities: Grossly normal range of motion. Warm, well perfused. No edema bilaterally. Skin: No rashes or lesions. Lymphatic: No cervical, supraclavicular, or inguinal adenopathy. Genitourinary: External genitalia with veruccous lesion of the posterior vulva in the midline, extending to the left and onto the perineum. Urethral meatus without lesions or prolapse. On speculum exam, vagina and cervix without lesions. Pap smear collected. Bimanual exam reveals normal cervix and uterus. Exam chaperoned by Melissa Cross, NP    VULVAR COLPOSCOPY PROCEDURE NOTE  Procedure Details: After appropriate verbal informed consent was obtained, a timeout was performed. Acetic acid was applied to the vulva and the vulva was inspected with the colposcope with the findings as noted below. The vulva was then cleansed of the acetic acid with water. The patient tolerated the procedure well.   Adequate Exam: Yes  Biopsy Specimen: 3mm punch biopsy of posterior vulva  Condition: Stable. Patient tolerated procedure well.  Complications: None  Findings: Acetowhite changes of posterior vulva on the perineum. Thickest area at around 6 o'clock in lesion. Approaches the anus but does not directly involve the anus.  Procedure: The procedure was explained to the patient and verbal consent was obtained prior to the procedure.  The skin was cleaned with Betadine.  1 ml of 1% lidocaine was injected at the planned biopsy site.  A 3 mm punch biopsy was used to obtain the biopsy specimen.  Hemostasis was achieved with silver nitrate.    Patient tolerated the procedure well.  Colposcopic Impression: VIN3   LABORATORY AND RADIOLOGIC DATA: Outside medical records were reviewed to synthesize the above history, along with the history and physical obtained during  the visit.  Outside laboratory, pathology reports were reviewed, with pertinent results below.   WBC  Date Value Ref Range Status  04/01/2022 11.6 (H) 4.0 - 10.5 K/uL Final   Hemoglobin  Date Value Ref Range Status  04/01/2022 16.7 (H) 12.0 - 15.0 g/dL Final   HCT  Date Value Ref Range Status  04/01/2022 49.1 (H) 36.0 - 46.0 % Final   Platelets  Date Value Ref Range Status  04/01/2022 285 150 - 400 K/uL Final   Magnesium  Date Value Ref Range Status  06/27/2021 1.9 1.7 - 2.4 mg/dL Final    Comment:    Performed at [facility], [address] [place], [place] [address]   Creat  Date Value Ref Range Status  01/14/2019 0.77 0.50 - 0.99 mg/dL Final    Comment:    For patients >49 years of age, the reference limit for Creatinine is approximately 13% higher for people identified as African-American. .    Creatinine, Ser  Date Value Ref Range Status  04/01/2022 0.74 0.44 - 1.00 mg/dL Final   AST  Date Value Ref Range Status  05/11/2022 24 0 - 40 IU/L Final   ALT  Date Value Ref Range Status  05/11/2022 25 0 - 32 IU/L Final    FINAL MICROSCOPIC DIAGNOSIS:   A. VULVA, BIOPSY:       High-grade intraepithelial lesion (HSIL / VIN 3).       See comment.   COMMENT:   Immunohistochemical stains for p16 and p53 were performed. P16 shows  block like pattern of staining. P53 is wild type of staining. The  findings are supportive of a HPV-dependent p53- wild type VIN3.  

## 2022-06-21 ENCOUNTER — Encounter: Payer: Self-pay | Admitting: Psychiatry

## 2022-06-21 NOTE — Progress Notes (Signed)
This encounter was created in error - please disregard.

## 2022-06-22 LAB — SURGICAL PATHOLOGY

## 2022-06-23 LAB — CYTOLOGY - PAP
Comment: NEGATIVE
Diagnosis: NEGATIVE
High risk HPV: NEGATIVE

## 2022-06-27 ENCOUNTER — Encounter (HOSPITAL_COMMUNITY): Admission: RE | Admit: 2022-06-27 | Payer: 59 | Source: Ambulatory Visit

## 2022-06-27 ENCOUNTER — Encounter (HOSPITAL_COMMUNITY): Payer: Self-pay

## 2022-06-27 ENCOUNTER — Encounter (HOSPITAL_COMMUNITY): Payer: 59

## 2022-06-28 ENCOUNTER — Telehealth: Payer: Self-pay | Admitting: Internal Medicine

## 2022-06-28 ENCOUNTER — Telehealth: Payer: Self-pay | Admitting: *Deleted

## 2022-06-28 NOTE — Telephone Encounter (Signed)
   Pre-operative Risk Assessment    Patient Name: Laura Mcgee  DOB: Jan 17, 1951 MRN: [id number]      Request for Surgical Clearance    Procedure:   simple partial vulvectomy and possible rotational flap   Date of Surgery:  Clearance 07/12/22                                 Surgeon:  Clide Cliff MD  Surgeon's Group or Practice Name:  [facility]  Phone number:  [phone] Fax number:  [phone]   Type of Clearance Requested:   - Medical    Type of Anesthesia:  Not Indicated   Additional requests/questions:    Lajuana Matte   06/28/2022, 10:49 AM

## 2022-06-28 NOTE — Telephone Encounter (Signed)
She was seen by Dr. Jenene Slicker on 06/14/22 for cardiac risk assessment for shoulder surgery and recommended to have nuclear stress test which is scheduled for 06/29/22. This request is for vulvectomy, however same guidelines would apply.  Will await test results.  Levi Aland, NP-C  06/28/2022, 11:28 AM [address], [address] Office [phone] Fax [phone]

## 2022-06-28 NOTE — Telephone Encounter (Signed)
Fax surgical optimization to cardiology office

## 2022-06-29 ENCOUNTER — Ambulatory Visit (HOSPITAL_COMMUNITY)
Admission: RE | Admit: 2022-06-29 | Discharge: 2022-06-29 | Disposition: A | Discharge status: Home / Self Care | Payer: 59 | Source: Ambulatory Visit | Attending: Internal Medicine | Admitting: Internal Medicine

## 2022-06-29 ENCOUNTER — Encounter (HOSPITAL_COMMUNITY): Payer: Self-pay

## 2022-06-29 DIAGNOSIS — Z01810 Encounter for preprocedural cardiovascular examination: Secondary | ICD-10-CM | POA: Insufficient documentation

## 2022-06-29 DIAGNOSIS — R0609 Other forms of dyspnea: Secondary | ICD-10-CM | POA: Insufficient documentation

## 2022-06-29 HISTORY — DX: Malignant (primary) neoplasm, unspecified: C80.1

## 2022-06-29 LAB — NM MYOCAR MULTI W/SPECT W/WALL MOTION / EF
Base ST Depression (mm): 0 mm
LV dias vol: 68 mL (ref 46–106)
LV sys vol: 30 mL
Nuc Stress EF: 56 %
Peak HR: 96 {beats}/min
RATE: 0.4
Rest HR: 69 {beats}/min
Rest Nuclear Isotope Dose: 10.8 mCi
SDS: 6
SRS: 0
SSS: 6
ST Depression (mm): 0 mm
Stress Nuclear Isotope Dose: 33 mCi
TID: 1.07

## 2022-06-29 MED ORDER — TECHNETIUM TC 99M TETROFOSMIN IV KIT
30.0000 | PACK | Freq: Once | INTRAVENOUS | Status: AC | PRN
  Administered 2022-06-29: 33 via INTRAVENOUS

## 2022-06-29 MED ORDER — SODIUM CHLORIDE FLUSH 0.9 % IV SOLN
INTRAVENOUS | Status: AC
  Filled 2022-06-29: qty 10

## 2022-06-29 MED ORDER — TECHNETIUM TC 99M TETROFOSMIN IV KIT
10.0000 | PACK | Freq: Once | INTRAVENOUS | Status: AC | PRN
  Administered 2022-06-29: 10.8 via INTRAVENOUS

## 2022-06-29 MED ORDER — REGADENOSON 0.4 MG/5ML IV SOLN
INTRAVENOUS | Status: AC
  Administered 2022-06-29: 0.4 mg via INTRAVENOUS
  Filled 2022-06-29: qty 5

## 2022-06-30 NOTE — Telephone Encounter (Signed)
   Patient Name: Laura Mcgee  DOB: 1950-10-21 MRN: [id number]  Primary Cardiologist: Marjo Bicker, MD  Chart reviewed as part of pre-operative protocol coverage. Per Dr. Jenene Slicker, "stress test is mildly abnormal. This is a low risk study. Patient has no angina and is at a moderate risk for any perioperative cardiac complications. No further cardiac testing is indicated prior to proceeding with the planned procedure. Follow up in one year."  I will route this recommendation to the requesting party via Epic fax function and remove from pre-op pool.  Please call with questions.  Joylene Grapes, NP 06/30/2022, 11:42 AM

## 2022-07-01 NOTE — Telephone Encounter (Signed)
Received clearance from cardiology.  

## 2022-07-04 ENCOUNTER — Other Ambulatory Visit: Payer: Self-pay

## 2022-07-04 ENCOUNTER — Inpatient Hospital Stay (HOSPITAL_BASED_OUTPATIENT_CLINIC_OR_DEPARTMENT_OTHER): Payer: 59 | Admitting: Gynecologic Oncology

## 2022-07-04 ENCOUNTER — Inpatient Hospital Stay: Payer: 59

## 2022-07-04 ENCOUNTER — Other Ambulatory Visit: Payer: Self-pay | Admitting: Gynecologic Oncology

## 2022-07-04 VITALS — BP 129/76 | HR 84 | Wt 169.4 lb

## 2022-07-04 DIAGNOSIS — D071 Carcinoma in situ of vulva: Secondary | ICD-10-CM

## 2022-07-04 LAB — CBC (CANCER CENTER ONLY)
HCT: 44 % (ref 36.0–46.0)
Hemoglobin: 15.4 g/dL — ABNORMAL HIGH (ref 12.0–15.0)
MCH: 33.7 pg (ref 26.0–34.0)
MCHC: 35 g/dL (ref 30.0–36.0)
MCV: 96.3 fL (ref 80.0–100.0)
Platelet Count: 240 10*3/uL (ref 150–400)
RBC: 4.57 MIL/uL (ref 3.87–5.11)
RDW: 14.5 % (ref 11.5–15.5)
WBC Count: 9.5 10*3/uL (ref 4.0–10.5)
nRBC: 0 % (ref 0.0–0.2)

## 2022-07-04 LAB — BASIC METABOLIC PANEL
Anion gap: 8 (ref 5–15)
BUN: 23 mg/dL (ref 8–23)
CO2: 27 mmol/L (ref 22–32)
Calcium: 9.6 mg/dL (ref 8.9–10.3)
Chloride: 106 mmol/L (ref 98–111)
Creatinine, Ser: 0.87 mg/dL (ref 0.44–1.00)
GFR, Estimated: >60 mL/min (ref >60)
Glucose, Bld: 86 mg/dL (ref 70–99)
Potassium: 4 mmol/L (ref 3.5–5.1)
Sodium: 141 mmol/L (ref 135–145)

## 2022-07-04 MED ORDER — SENNOSIDES-DOCUSATE SODIUM 8.6-50 MG PO TABS
2.0000 | ORAL_TABLET | Freq: Every day | ORAL | 0 refills | Status: DC
Start: 2022-07-04 — End: 2022-11-18

## 2022-07-04 NOTE — Patient Instructions (Signed)
DUE TO COVID-19 ONLY TWO VISITORS  (aged 73 and older)  ARE ALLOWED TO COME WITH YOU AND STAY IN THE WAITING ROOM ONLY DURING PRE OP AND PROCEDURE.   **NO VISITORS ARE ALLOWED IN THE SHORT STAY AREA OR RECOVERY ROOM!!**  IF YOU WILL BE ADMITTED INTO THE HOSPITAL YOU ARE ALLOWED ONLY FOUR SUPPORT PEOPLE DURING VISITATION HOURS ONLY (7 AM -8PM)   The support person(s) must pass our screening, gel in and out, and wear a mask at all times, including in the patient's room. Patients must also wear a mask when staff or their support person are in the room. Visitors GUEST BADGE MUST BE WORN VISIBLY  One adult visitor may remain with you overnight and MUST be in the room by 8 P.M.     Your procedure is scheduled on: 07/12/22   Report to [facility] Main Entrance    Report to admitting at : 5:15 AM   Call this number if you have problems the morning of surgery [phone]   Do not eat food :After Midnight.   After Midnight you may have the following liquids until : 4:30 AM DAY OF SURGERY  Water Black Coffee (sugar ok, NO MILK/CREAM OR CREAMERS)  Tea (sugar ok, NO MILK/CREAM OR CREAMERS) regular and decaf                             Plain Jell-O (NO RED)                                           Fruit ices (not with fruit pulp, NO RED)                                     Popsicles (NO RED)                                                                  Juice: apple, WHITE grape, WHITE cranberry Sports drinks like Gatorade (NO RED)               Oral Hygiene is also important to reduce your risk of infection.                                    Remember - BRUSH YOUR TEETH THE MORNING OF SURGERY WITH YOUR REGULAR TOOTHPASTE  DENTURES WILL BE REMOVED PRIOR TO SURGERY PLEASE DO NOT APPLY "Poly grip" OR ADHESIVES!!!   Do NOT smoke after Midnight   Take these medicines the morning of surgery with A SIP OF WATER: duloxetine,amlodipine.                              You may not have any  metal on your body including hair pins, jewelry, and body piercing             Do not wear make-up, lotions, powders, perfumes/cologne, or deodorant  Do  not wear nail polish including gel and S&S, artificial/acrylic nails, or any other type of covering on natural nails including finger and toenails. If you have artificial nails, gel coating, etc. that needs to be removed by a nail salon please have this removed prior to surgery or surgery may need to be canceled/ delayed if the surgeon/ anesthesia feels like they are unable to be safely monitored.   Do not shave  48 hours prior to surgery.   Do not bring valuables to the hospital. [facility] IS NOT             RESPONSIBLE   FOR VALUABLES.   Contacts, glasses, or bridgework may not be worn into surgery.   Bring small overnight bag day of surgery.   DO NOT BRING YOUR HOME MEDICATIONS TO THE HOSPITAL. PHARMACY WILL DISPENSE MEDICATIONS LISTED ON YOUR MEDICATION LIST TO YOU DURING YOUR ADMISSION IN THE HOSPITAL!    Patients discharged on the day of surgery will not be allowed to drive home.  Someone NEEDS to stay with you for the first 24 hours after anesthesia.   Special Instructions: Bring a copy of your healthcare power of attorney and living will documents         the day of surgery if you haven't scanned them before.              Please read over the following fact sheets you were given: IF YOU HAVE QUESTIONS ABOUT YOUR PRE-OP INSTRUCTIONS PLEASE CALL [phone]    [facility] - Preparing for Surgery Before surgery, you can play an important role.  Because skin is not sterile, your skin needs to be as free of germs as possible.  You can reduce the number of germs on your skin by washing with CHG (chlorahexidine gluconate) soap before surgery.  CHG is an antiseptic cleaner which kills germs and bonds with the skin to continue killing germs even after washing. Please DO NOT use if you have an allergy to CHG or antibacterial soaps.  If  your skin becomes reddened/irritated stop using the CHG and inform your nurse when you arrive at Short Stay. Do not shave (including legs and underarms) for at least 48 hours prior to the first CHG shower.  You may shave your face/neck. Please follow these instructions carefully:  1.  Shower with CHG Soap the night before surgery and the  morning of Surgery.  2.  If you choose to wash your hair, wash your hair first as usual with your  normal  shampoo.  3.  After you shampoo, rinse your hair and body thoroughly to remove the  shampoo.                           4.  Use CHG as you would any other liquid soap.  You can apply chg directly  to the skin and wash                       Gently with a scrungie or clean washcloth.  5.  Apply the CHG Soap to your body ONLY FROM THE NECK DOWN.   Do not use on face/ open                           Wound or open sores. Avoid contact with eyes, ears mouth and genitals (private parts).  Wash face,  Genitals (private parts) with your normal soap.             6.  Wash thoroughly, paying special attention to the area where your surgery  will be performed.  7.  Thoroughly rinse your body with warm water from the neck down.  8.  DO NOT shower/wash with your normal soap after using and rinsing off  the CHG Soap.                9.  Pat yourself dry with a clean towel.            10.  Wear clean pajamas.            11.  Place clean sheets on your bed the night of your first shower and do not  sleep with pets. Day of Surgery : Do not apply any lotions/deodorants the morning of surgery.  Please wear clean clothes to the hospital/surgery center.  FAILURE TO FOLLOW THESE INSTRUCTIONS MAY RESULT IN THE CANCELLATION OF YOUR SURGERY PATIENT SIGNATURE_________________________________  NURSE SIGNATURE__________________________________  ________________________________________________________________________ WHAT IS A BLOOD TRANSFUSION? Blood Transfusion  Information  A transfusion is the replacement of blood or some of its parts. Blood is made up of multiple cells which provide different functions. Red blood cells carry oxygen and are used for blood loss replacement. White blood cells fight against infection. Platelets control bleeding. Plasma helps clot blood. Other blood products are available for specialized needs, such as hemophilia or other clotting disorders. BEFORE THE TRANSFUSION  Who gives blood for transfusions?  Healthy volunteers who are fully evaluated to make sure their blood is safe. This is blood bank blood. Transfusion therapy is the safest it has ever been in the practice of medicine. Before blood is taken from a donor, a complete history is taken to make sure that person has no history of diseases nor engages in risky social behavior (examples are intravenous drug use or sexual activity with multiple partners). The donor's travel history is screened to minimize risk of transmitting infections, such as malaria. The donated blood is tested for signs of infectious diseases, such as HIV and hepatitis. The blood is then tested to be sure it is compatible with you in order to minimize the chance of a transfusion reaction. If you or a relative donates blood, this is often done in anticipation of surgery and is not appropriate for emergency situations. It takes many days to process the donated blood. RISKS AND COMPLICATIONS Although transfusion therapy is very safe and saves many lives, the main dangers of transfusion include:  Getting an infectious disease. Developing a transfusion reaction. This is an allergic reaction to something in the blood you were given. Every precaution is taken to prevent this. The decision to have a blood transfusion has been considered carefully by your caregiver before blood is given. Blood is not given unless the benefits outweigh the risks. AFTER THE TRANSFUSION Right after receiving a blood transfusion,  you will usually feel much better and more energetic. This is especially true if your red blood cells have gotten low (anemic). The transfusion raises the level of the red blood cells which carry oxygen, and this usually causes an energy increase. The nurse administering the transfusion will monitor you carefully for complications. HOME CARE INSTRUCTIONS  No special instructions are needed after a transfusion. You may find your energy is better. Speak with your caregiver about any limitations on activity for underlying diseases you may have. SEEK MEDICAL  CARE IF:  Your condition is not improving after your transfusion. You develop redness or irritation at the intravenous (IV) site. SEEK IMMEDIATE MEDICAL CARE IF:  Any of the following symptoms occur over the next 12 hours: Shaking chills. You have a temperature by mouth above 102 F (38.9 C), not controlled by medicine. Chest, back, or muscle pain. People around you feel you are not acting correctly or are confused. Shortness of breath or difficulty breathing. Dizziness and fainting. You get a rash or develop hives. You have a decrease in urine output. Your urine turns a dark color or changes to pink, red, or brown. Any of the following symptoms occur over the next 10 days: You have a temperature by mouth above 102 F (38.9 C), not controlled by medicine. Shortness of breath. Weakness after normal activity. The white part of the eye turns yellow (jaundice). You have a decrease in the amount of urine or are urinating less often. Your urine turns a dark color or changes to pink, red, or brown. Document Released: 02/19/2000 Document Revised: 05/16/2011 Document Reviewed: 10/08/2007 [facility] Patient Information 2014 [place], [place].  _______________________________________________________________________

## 2022-07-04 NOTE — Patient Instructions (Addendum)
Preparing for your Surgery  Plan for surgery on Jul 12, 2022 with Dr. Clide Cliff at [facility]. You will be scheduled for examination under anesthesia, simple partial vulvectomy, possible rotational flap .   Pre-operative Testing -You will receive a phone call from presurgical testing at [facility] to discuss surgery instructions and arrange for lab work if needed.  -Bring your insurance card, copy of an advanced directive if applicable, medication list.  -You should not be taking blood thinners or aspirin at least ten days prior to surgery unless instructed by your surgeon.  -Do not take supplements such as fish oil (omega 3), red yeast rice, turmeric before your surgery. You want to avoid medications with aspirin in them including headache powders such as BC or Goody's), Excedrin migraine.  Day Before Surgery at Home -You will be advised you can have clear liquids up until 3 hours before your surgery.    Your role in recovery Your role is to become active as soon as directed by your doctor, while still giving yourself time to heal.  Rest when you feel tired. You will be asked to do the following in order to speed your recovery:  - Cough and breathe deeply. This helps to clear and expand your lungs and can prevent pneumonia after surgery.  - STAY ACTIVE WHEN YOU GET HOME. Do mild physical activity. Walking or moving your legs help your circulation and body functions return to normal. Do not try to get up or walk alone the first time after surgery.   -If you develop swelling on one leg or the other, pain in the back of your leg, redness/warmth in one of your legs, please call the office or go to the Emergency Room to have a doppler to rule out a blood clot. For shortness of breath, chest pain-seek care in the Emergency Room as soon as possible. - Actively manage your pain. Managing your pain lets you move in comfort. We will ask you to rate your pain on a scale of zero  to 10. It is your responsibility to tell your doctor or nurse where and how much you hurt so your pain can be treated.  Special Considerations -Your final pathology results from surgery should be available around one week after surgery and the results will be relayed to you when available.  -FMLA forms can be faxed to [phone] and please allow 5-7 business days for completion.  Pain Management After Surgery -You will be prescribed your pain medication and bowel regimen medications before surgery so that you can have these available when you are discharged from the hospital. The pain medication is for use ONLY AFTER surgery and a new prescription will not be given.   -Make sure that you have Tylenol and Ibuprofen at home IF YOU ARE ABLE TO TAKE THESE MEDICATIONS to use on a regular basis after surgery for pain control. We recommend alternating the medications every hour to six hours since they work differently and are processed in the body differently for pain relief.  -Review the attached handout on narcotic use and their risks and side effects.   Bowel Regimen -You will be prescribed Sennakot-S to take nightly to prevent constipation especially if you are taking the narcotic pain medication intermittently.  It is important to prevent constipation and drink adequate amounts of liquids. You can stop taking this medication when you are not taking pain medication and you are back on your normal bowel routine.  Risks of  Surgery Risks of surgery are low but include bleeding, infection, damage to surrounding structures, re-operation, blood clots, and very rarely death.  AFTER SURGERY INSTRUCTIONS  Return to work:  variable based on occupation  We recommend purchasing several bags of frozen green peas and dividing them into ziploc bags. You will want to keep these in the freezer and have them ready to use as ice packs to the vulvar incision. Once the ice pack is no longer cold, you can get  another from the freezer. The frozen peas mold to your body better than a regular ice pack.   Activity: 1. Be up and out of the bed during the day.  Take a nap if needed.  You may walk up steps but be careful and use the hand rail.  Stair climbing will tire you more than you think, you may need to stop part way and rest.   2. No lifting or straining for 4 weeks over 10 pounds. No pushing, pulling, straining for 4 weeks.  3. No driving for minimum 1 week after surgery.  Do not drive if you are taking narcotic pain medicine and make sure that your reaction time has returned.   4. You can shower as soon as the next day after surgery. Shower daily. No tub baths or submerging your body in water until cleared by your surgeon. If you have the soap that was given to you by pre-surgical testing that was used before surgery, you do not need to use it afterwards because this can irritate your incisions.   5. No sexual activity and nothing in the vagina for 6 weeks.  6. You may experience vaginal spotting and discharge after surgery.  The spotting is normal but if you experience heavy bleeding, call our office.  7. Take Tylenol or ibuprofen first for pain if you are able to take these medications and only use narcotic pain medication for severe pain not relieved by the Tylenol or Ibuprofen.  Monitor your Tylenol intake to a max of 4,000 mg in a 24 hour period. You can alternate these medications after surgery.  Diet: 1. Low sodium Heart Healthy Diet is recommended but you are cleared to resume your normal (before surgery) diet after your procedure.  2. It is safe to use a laxative, such as Miralax or Colace, if you have difficulty moving your bowels. You have been prescribed Sennakot at bedtime every evening to keep bowel movements regular and to prevent constipation.    Wound Care: 1. Keep clean and dry.  Shower daily.  Reasons to call the Doctor: Fever - Oral temperature greater than 100.4 degrees  Fahrenheit Foul-smelling vaginal discharge Difficulty urinating Nausea and vomiting Increased pain at the site of the incision that is unrelieved with pain medicine. Difficulty breathing with or without chest pain New calf pain especially if only on one side Sudden, continuing increased vaginal bleeding with or without clots.   Contacts: For questions or concerns you should contact:  Dr. Clide Cliff at [phone]  Warner Mccreedy, NP at [phone]  After Hours: call [phone] and have the GYN Oncologist paged/contacted (after 5 pm or on the weekends).  Messages sent via mychart are for non-urgent matters and are not responded to after hours so for urgent needs, please call the after hours number.

## 2022-07-05 ENCOUNTER — Encounter (HOSPITAL_COMMUNITY)
Admission: RE | Admit: 2022-07-05 | Discharge: 2022-07-05 | Disposition: A | Discharge status: Home / Self Care | Payer: 59 | Source: Ambulatory Visit | Attending: Anesthesiology | Admitting: Anesthesiology

## 2022-07-05 ENCOUNTER — Encounter (HOSPITAL_COMMUNITY): Payer: Self-pay

## 2022-07-05 ENCOUNTER — Other Ambulatory Visit: Payer: Self-pay

## 2022-07-05 ENCOUNTER — Ambulatory Visit: Payer: 59 | Admitting: Gastroenterology

## 2022-07-05 NOTE — Progress Notes (Addendum)
Pt. Did not showed up for her PST appointment.Unable to reach pt. Over the phone.RN contacted pt's daughter,she said they were told yesterday that she does not need to come for PST appointment,since they did labs yesterday at the cancer center.Waiting to do interview over the phone,when pt. Answer her phone. Interview was done over the phone,pt. Got the instructions and she verbalized her understanding of them.

## 2022-07-05 NOTE — Progress Notes (Signed)
For Short Stay: COVID SWAB appointment date:  Bowel Prep reminder: N/A   For Anesthesia: PCP - Dr. John Giovanni Cardiologist - Dr. Luane School. LOV: 06/14/22 Clearance: Emily Monge:NP: 06/30/22 Chest x-ray -  EKG - 06/14/22 Stress Test -  ECHO - 06/24/21 Cardiac Cath -  Pacemaker/ICD device last checked: Pacemaker orders received: Device Rep notified:  Spinal Cord Stimulator: N/A  Sleep Study - N/A CPAP -   Fasting Blood Sugar - N/A Checks Blood Sugar _____ times a day Date and result of last Hgb A1c-  Last dose of GLP1 agonist- N/A GLP1 instructions:   Last dose of SGLT-2 inhibitors-  SGLT-2 instructions:   Blood Thinner Instructions: N/A Aspirin Instructions: Last Dose:  Activity level: Can go up a flight of stairs and activities of daily living without stopping and without chest pain and/or shortness of breath   Able to exercise without chest pain and/or shortness of breath  Anesthesia review: Hx: HTN,PE,Smoker,Afib.  Patient denies shortness of breath, fever, cough and chest pain at PAT appointment   Patient verbalized understanding of instructions that were given to them at the PAT appointment. Patient was also instructed that they will need to review over the PAT instructions again at home before surgery.

## 2022-07-05 NOTE — Progress Notes (Signed)
Patient here for a pre-operative appointment prior to her scheduled surgery on Jul 12, 2022. She is scheduled for examination under anesthesia, simple partial vulvectomy, possible rotational flap. The surgery was discussed in detail.  See after visit summary for additional details. Given IS, donut pillow, peri bottle.    Discussed post-op pain management in detail including the aspects of the enhanced recovery pathway. She takes chronic hydrocodone/APAP. Also prescribed sennakot to be used after surgery and to hold if having loose stools.  Discussed bowel regimen in detail.     Discussed the use of SCDs and measures to take at home to prevent DVT including frequent mobility.  Reportable signs and symptoms of DVT discussed. Post-operative instructions discussed and expectations for after surgery. Incisional care discussed as well including reportable signs and symptoms including erythema, drainage, wound separation.     10 minutes spent with the patient/preparing information.  Verbalizing understanding of material discussed. No needs or concerns voiced at the end of the visit.   Advised patient to call for any needs.    This appointment is included in the global surgical bundle as pre-operative teaching and has no charge.

## 2022-07-06 NOTE — Progress Notes (Signed)
Case: [id number] Date/Time: 07/12/22 0915   Procedures:      Francia Greaves UNDER ANESTHESIA     SIMPLE PARTIAL VULVECTOMY,POSSIBLE ROTATIONAL FLAP   Anesthesia type: General   Pre-op diagnosis: VIN 3   Location: [facility] ROOM 01 / [facility]   Surgeons: Clide Cliff, MD       DISCUSSION: Laura Mcgee is a 72 yo female who is being evaluated prior to surgery listed above. Patient was diagnosed with vulvular dysplasia and is now scheduled for vulvectomy. Other PMH includes current smoker, A.fib (not anticoagulated due to ETOH use), COPD, GERD, HTN, hx of PE. Prior anesthesia complication includes laryngospasms thought to be due to GERD. No difficulty with intubation.  Patient last saw Dr. Ancil Boozer on 06/14/2022 who provided preop cardiac risk stratification as follows:  "METs less than 4. EKG showed normal sinus rhythm and no ST changes.  Echocardiogram showed normal LVEF and no valve abnormalities. Due to METs less than 4 and significant risk factors of HTN, alcohol use and longstanding smoking history, she will benefit from obtaining pharmacological NM stress test. NM stress test showed ischemia in apex, but study is low risk. Patient is at a moderate risk for any perioperative cardiac complications and no further testing is indicated prior to proceeding with the planned procedure. Follow up in one year."    VS: LMP 06/20/1997 (Approximate) Comment: post menopausal  PROVIDERS: John Giovanni, MD Cardiology: Dr. Luane School  LABS: Labs reviewed: Acceptable for surgery. (all labs ordered are listed, but only abnormal results are displayed)  Labs Reviewed - No data to display   IMAGES:  CXR 06/23/21:  IMPRESSION: 1. Minimal left base atelectasis.   EKG 06/14/22:  SR with sinus arrhythmia Possible right atrial enlargement   CV:  Stress test 06/29/22:    Stress ECG is negative for ischemia and arrythmias.   LV perfusion is abnormal. There is evidence of ischemia. There is no  evidence of infarction. There is a small reversible perfusion defect with mild reduction in uptake present in the apex consistent with ischemia.   Left ventricular function is normal. Nuclear stress EF: 56 %.   Findings are consistent with ischemia. The study is low risk.  Echo 06/24/21: IMPRESSIONS     1. Left ventricular ejection fraction, by estimation, is 60 to 65%. The  left ventricle has normal function. The left ventricle has no regional  wall motion abnormalities. Left ventricular diastolic parameters are  consistent with Grade I diastolic  dysfunction (impaired relaxation).   2. Right ventricular systolic function is normal. The right ventricular  size is normal. Tricuspid regurgitation signal is inadequate for assessing  PA pressure.   3. The mitral valve was not well visualized. No evidence of mitral valve  regurgitation. No evidence of mitral stenosis.   4. The aortic valve was not well visualized. Aortic valve regurgitation  is not visualized. No aortic stenosis is present.   5. The inferior vena cava is normal in size with greater than 50%  respiratory variability, suggesting right atrial pressure of 3 mmHg.    Carotid US 06/24/21:  IMPRESSION: 1. Right carotid artery system: Less than 50% stenosis secondary to mild multifocal atherosclerotic plaque formation.   2. Left carotid artery system: Less than 50% stenosis secondary to moderate multifocal atherosclerotic plaque formation, most prominent about the carotid bulb.   3.  Vertebral artery system: Patent with antegrade flow bilaterally.  Past Medical History:  Diagnosis Date   Arthritis    Atrial fibrillation (HCC)  Cancer (HCC)    Vaginal   COPD (chronic obstructive pulmonary disease) (HCC)    Depression    Difficult intubation    History of laryngospasm   Dysrhythmia    GERD (gastroesophageal reflux disease)    Hyperlipidemia    Hypertension    Numbness and tingling    Pulmonary embolism (HCC) 1973     Past Surgical History:  Procedure Laterality Date   CERVIX LESION DESTRUCTION     throat   COLONOSCOPY WITH PROPOFOL N/A 04/07/2015   SLF: 1. seven colorectal polyps removed 2. the left colon is redundant 3. small internal hemorrhoids (1 simple adenoma, 6 hyperplastic) . Repeat in 5 years.   CYST EXCISION Left    Arm   ESOPHAGOGASTRODUODENOSCOPY (EGD) WITH PROPOFOL N/A 04/07/2015   SLF: 1. Schatzki ring 2. Bravo capsule 34 cm from the teeth 3. moderate non-erosive gastritis.    HUMERUS IM NAIL Right 06/25/2021   Procedure: INTRAMEDULLARY (IM) NAIL HUMERAL;  Surgeon: Oliver Barre, MD;  Location: [facility];  Service: Orthopedics;  Laterality: Right;   MASS EXCISION N/A 2002   Lanyx   MASS EXCISION Left 01/21/2015   Procedure: EXCISION CYST LEFT UPPER ARM;  Surgeon: Franky Macho, MD;  Location: [facility];  Service: General;  Laterality: Left;   MICROLARYNGOSCOPY N/A 05/31/2016   Procedure: MICRO LARYNGOSCOPY WITH BIOPSY OF VOCAL CORD LESION;  Surgeon: Newman Pies, MD;  Location: [facility];  Service: ENT;  Laterality: N/A;   POLYPECTOMY N/A 04/07/2015   Procedure: POLYPECTOMY;  Surgeon: West Bali, MD;  [facility];  Service: Endoscopy;  Laterality: N/A;  Descending colon polyps x 3    SAVORY DILATION N/A 04/07/2015   Procedure: SAVORY DILATION;  Surgeon: West Bali, MD;  [facility];  Service: Endoscopy;  Laterality: N/A;    MEDICATIONS: No current facility-administered medications for this encounter.    amLODipine (NORVASC) 5 MG tablet   Aspirin-Salicylamide-Caffeine (BC HEADACHE POWDER PO)   cholecalciferol (VITAMIN D3) 25 MCG (1000 UNIT) tablet   docusate sodium (COLACE) 100 MG capsule   DULoxetine (CYMBALTA) 60 MG capsule   HYDROcodone-acetaminophen (NORCO) 10-325 MG tablet   lovastatin (MEVACOR) 20 MG tablet   olmesartan (BENICAR) 20 MG tablet   ondansetron (ZOFRAN) 4 MG tablet   pantoprazole (PROTONIX) 40 MG tablet   Propylene  Glycol (SYSTANE COMPLETE OP)   venlafaxine XR (EFFEXOR-XR) 150 MG 24 hr capsule   senna-docusate (SENOKOT-S) 8.6-50 MG tablet   Ubaldo Glassing, PA-C [facility] Short Stay/Anesthesiology [facility] Phone [phone] 07/06/2022 10:09 AM

## 2022-07-06 NOTE — Anesthesia Preprocedure Evaluation (Addendum)
Anesthesia Evaluation  Patient identified by MRN, date of birth, ID band Patient awake    Reviewed: Allergy & Precautions, NPO status , Patient's Chart, lab work & pertinent test results  History of Anesthesia Complications (+) history of anesthetic complications (larygnospasm)  Airway Mallampati: III  TM Distance: >3 FB Neck ROM: Full   Comment: Previous grade I view with MAC 3, easy mask 06/25/2021 Dental  (+) Lower Dentures, Upper Dentures, Dental Advisory Given   Pulmonary neg shortness of breath, neg sleep apnea, COPD (patient denies), neg recent URI, Current Smoker and Patient abstained from smoking., PE (1973)   Pulmonary exam normal breath sounds clear to auscultation       Cardiovascular hypertension (amlodipine, olmesartan), Pt. on medications (-) angina (-) Past MI, (-) Cardiac Stents and (-) CABG + dysrhythmias Atrial Fibrillation  Rhythm:Regular Rate:Normal  HLD  Stress Test 06/29/2022: Narrative & Impression     Stress ECG is negative for ischemia and arrythmias.   LV perfusion is abnormal. There is evidence of ischemia. There is no evidence of infarction. There is a small reversible perfusion defect with mild reduction in uptake present in the apex consistent with ischemia.   Left ventricular function is normal. Nuclear stress EF: 56 %.   Findings are consistent with ischemia. The study is low risk.  TTE 06/24/2021: IMPRESSIONS     1. Left ventricular ejection fraction, by estimation, is 60 to 65%. The  left ventricle has normal function. The left ventricle has no regional  wall motion abnormalities. Left ventricular diastolic parameters are  consistent with Grade I diastolic  dysfunction (impaired relaxation).   2. Right ventricular systolic function is normal. The right ventricular  size is normal. Tricuspid regurgitation signal is inadequate for assessing  PA pressure.   3. The mitral valve was not well  visualized. No evidence of mitral valve  regurgitation. No evidence of mitral stenosis.   4. The aortic valve was not well visualized. Aortic valve regurgitation  is not visualized. No aortic stenosis is present.   5. The inferior vena cava is normal in size with greater than 50%  respiratory variability, suggesting right atrial pressure of 3 mmHg.       Neuro/Psych neg Seizures PSYCHIATRIC DISORDERS  Depression     Neuromuscular disease (peripheral neuropathy)    GI/Hepatic ,GERD  Medicated,,(+)     substance abuse (drinks 2 drinks a night, last smoked last night)  alcohol use and marijuana use  Endo/Other  negative endocrine ROS    Renal/GU negative Renal ROS     Musculoskeletal  (+) Arthritis ,    Abdominal   Peds  Hematology negative hematology ROS (+)   Anesthesia Other Findings   Reproductive/Obstetrics                             Anesthesia Physical Anesthesia Plan  ASA: 3  Anesthesia Plan: General   Post-op Pain Management: Tylenol PO (pre-op)*   Induction: Intravenous  PONV Risk Score and Plan: 2 and Ondansetron, Dexamethasone and Treatment may vary due to age or medical condition  Airway Management Planned: Oral ETT  Additional Equipment:   Intra-op Plan:   Post-operative Plan: Extubation in OR  Informed Consent: I have reviewed the patients History and Physical, chart, labs and discussed the procedure including the risks, benefits and alternatives for the proposed anesthesia with the patient or authorized representative who has indicated his/her understanding and acceptance.     Dental  advisory given  Plan Discussed with: CRNA and Anesthesiologist  Anesthesia Plan Comments: (See PAT note from 5/1 by Sherlie Ban PA-C  Risks of general anesthesia discussed including, but not limited to, sore throat, hoarse voice, chipped/damaged teeth, injury to vocal cords, nausea and vomiting, allergic reactions, lung infection, heart  attack, stroke, and death. All questions answered. )        Anesthesia Quick Evaluation

## 2022-07-11 ENCOUNTER — Telehealth: Payer: Self-pay | Admitting: *Deleted

## 2022-07-11 ENCOUNTER — Encounter (HOSPITAL_COMMUNITY): Admission: RE | Admit: 2022-07-11 | Payer: 59 | Source: Ambulatory Visit

## 2022-07-11 NOTE — Telephone Encounter (Signed)
Telephone call to check on pre-operative status.  Patient compliant with pre-operative instructions.  Reinforced nothing to eat after midnight. Clear liquids until 0615. Patient to arrive at 0715.  No questions or concerns voiced.  Instructed to call for any needs.  ?

## 2022-07-12 ENCOUNTER — Other Ambulatory Visit: Payer: Self-pay

## 2022-07-12 ENCOUNTER — Ambulatory Visit (HOSPITAL_BASED_OUTPATIENT_CLINIC_OR_DEPARTMENT_OTHER): Payer: 59 | Admitting: Medical

## 2022-07-12 ENCOUNTER — Ambulatory Visit (HOSPITAL_COMMUNITY): Payer: 59 | Admitting: Medical

## 2022-07-12 ENCOUNTER — Encounter (HOSPITAL_COMMUNITY): Payer: Self-pay | Admitting: Psychiatry

## 2022-07-12 ENCOUNTER — Encounter (HOSPITAL_COMMUNITY)
Admission: RE | Disposition: A | Discharge status: Home / Self Care | Payer: Self-pay | Source: Home / Self Care | Attending: Psychiatry

## 2022-07-12 ENCOUNTER — Ambulatory Visit (HOSPITAL_COMMUNITY)
Admission: RE | Admit: 2022-07-12 | Discharge: 2022-07-12 | Disposition: A | Discharge status: Home / Self Care | Payer: 59 | Attending: Psychiatry | Admitting: Psychiatry

## 2022-07-12 DIAGNOSIS — M1990 Unspecified osteoarthritis, unspecified site: Secondary | ICD-10-CM | POA: Insufficient documentation

## 2022-07-12 DIAGNOSIS — I10 Essential (primary) hypertension: Secondary | ICD-10-CM | POA: Insufficient documentation

## 2022-07-12 DIAGNOSIS — D071 Carcinoma in situ of vulva: Secondary | ICD-10-CM | POA: Diagnosis present

## 2022-07-12 DIAGNOSIS — I498 Other specified cardiac arrhythmias: Secondary | ICD-10-CM | POA: Diagnosis not present

## 2022-07-12 DIAGNOSIS — F17210 Nicotine dependence, cigarettes, uncomplicated: Secondary | ICD-10-CM | POA: Insufficient documentation

## 2022-07-12 DIAGNOSIS — G629 Polyneuropathy, unspecified: Secondary | ICD-10-CM | POA: Diagnosis not present

## 2022-07-12 DIAGNOSIS — Z86010 Personal history of colonic polyps: Secondary | ICD-10-CM | POA: Insufficient documentation

## 2022-07-12 DIAGNOSIS — I4891 Unspecified atrial fibrillation: Secondary | ICD-10-CM | POA: Diagnosis not present

## 2022-07-12 DIAGNOSIS — D4959 Neoplasm of unspecified behavior of other genitourinary organ: Secondary | ICD-10-CM

## 2022-07-12 DIAGNOSIS — J449 Chronic obstructive pulmonary disease, unspecified: Secondary | ICD-10-CM | POA: Diagnosis not present

## 2022-07-12 DIAGNOSIS — F32A Depression, unspecified: Secondary | ICD-10-CM | POA: Insufficient documentation

## 2022-07-12 DIAGNOSIS — K219 Gastro-esophageal reflux disease without esophagitis: Secondary | ICD-10-CM | POA: Diagnosis not present

## 2022-07-12 DIAGNOSIS — Z86711 Personal history of pulmonary embolism: Secondary | ICD-10-CM | POA: Diagnosis not present

## 2022-07-12 DIAGNOSIS — E785 Hyperlipidemia, unspecified: Secondary | ICD-10-CM | POA: Insufficient documentation

## 2022-07-12 HISTORY — PX: VULVECTOMY: SHX1086

## 2022-07-12 LAB — CBC
HCT: 44.7 % (ref 36.0–46.0)
Hemoglobin: 14.9 g/dL (ref 12.0–15.0)
MCH: 32.3 pg (ref 26.0–34.0)
MCHC: 33.3 g/dL (ref 30.0–36.0)
MCV: 97 fL (ref 80.0–100.0)
Platelets: 267 10*3/uL (ref 150–400)
RBC: 4.61 MIL/uL (ref 3.87–5.11)
RDW: 14.4 % (ref 11.5–15.5)
WBC: 7.7 10*3/uL (ref 4.0–10.5)
nRBC: 0 % (ref 0.0–0.2)

## 2022-07-12 LAB — TYPE AND SCREEN
ABO/RH(D): O POS
Antibody Screen: NEGATIVE

## 2022-07-12 LAB — BASIC METABOLIC PANEL
Anion gap: 8 (ref 5–15)
BUN: 17 mg/dL (ref 8–23)
CO2: 23 mmol/L (ref 22–32)
Calcium: 8.9 mg/dL (ref 8.9–10.3)
Chloride: 103 mmol/L (ref 98–111)
Creatinine, Ser: 1.32 mg/dL — ABNORMAL HIGH (ref 0.44–1.00)
GFR, Estimated: 43 mL/min — ABNORMAL LOW (ref >60)
Glucose, Bld: 103 mg/dL — ABNORMAL HIGH (ref 70–99)
Potassium: 3.7 mmol/L (ref 3.5–5.1)
Sodium: 134 mmol/L — ABNORMAL LOW (ref 135–145)

## 2022-07-12 LAB — ABO/RH: ABO/RH(D): O POS

## 2022-07-12 SURGERY — EXAM UNDER ANESTHESIA
Anesthesia: General

## 2022-07-12 MED ORDER — ORAL CARE MOUTH RINSE: 15.0000 mL | Freq: Once | OROMUCOSAL | Status: AC

## 2022-07-12 MED ORDER — LACTATED RINGERS IV SOLN: INTRAVENOUS | Status: DC

## 2022-07-12 MED ORDER — PROPOFOL 10 MG/ML IV BOLUS
INTRAVENOUS | Status: AC
  Filled 2022-07-12: qty 20

## 2022-07-12 MED ORDER — LIDOCAINE HCL (PF) 2 % IJ SOLN
INTRAMUSCULAR | Status: AC
  Filled 2022-07-12: qty 5

## 2022-07-12 MED ORDER — PROPOFOL 10 MG/ML IV BOLUS
INTRAVENOUS | Status: DC | PRN
  Administered 2022-07-12: 150 mg via INTRAVENOUS

## 2022-07-12 MED ORDER — OXYCODONE HCL 5 MG/5ML PO SOLN: 5.0000 mg | Freq: Once | ORAL | Status: DC | PRN

## 2022-07-12 MED ORDER — FERRIC SUBSULFATE 259 MG/GM EX SOLN
CUTANEOUS | Status: AC
  Filled 2022-07-12: qty 8

## 2022-07-12 MED ORDER — PHENYLEPHRINE HCL-NACL 20-0.9 MG/250ML-% IV SOLN
INTRAVENOUS | Status: AC
  Filled 2022-07-12: qty 250

## 2022-07-12 MED ORDER — ONDANSETRON HCL 4 MG/2ML IJ SOLN
INTRAMUSCULAR | Status: AC
  Filled 2022-07-12: qty 2

## 2022-07-12 MED ORDER — BUPIVACAINE HCL 0.25 % IJ SOLN
INTRAMUSCULAR | Status: DC | PRN
  Administered 2022-07-12: 30 mL

## 2022-07-12 MED ORDER — HYDROCODONE-ACETAMINOPHEN 10-325 MG PO TABS: 1.0000 | ORAL_TABLET | Freq: Four times a day (QID) | ORAL | 0 refills | Status: DC | PRN

## 2022-07-12 MED ORDER — ROCURONIUM BROMIDE 10 MG/ML (PF) SYRINGE
PREFILLED_SYRINGE | INTRAVENOUS | Status: DC | PRN
  Administered 2022-07-12: 50 mg via INTRAVENOUS

## 2022-07-12 MED ORDER — 0.9 % SODIUM CHLORIDE (POUR BTL) OPTIME
TOPICAL | Status: DC | PRN
  Administered 2022-07-12: 1000 mL

## 2022-07-12 MED ORDER — FENTANYL CITRATE PF 50 MCG/ML IJ SOSY: 25.0000 ug | PREFILLED_SYRINGE | INTRAMUSCULAR | Status: DC | PRN

## 2022-07-12 MED ORDER — ACETIC ACID 5 % SOLN
Status: AC
  Filled 2022-07-12: qty 50

## 2022-07-12 MED ORDER — OXYCODONE HCL 5 MG PO TABS: 5.0000 mg | ORAL_TABLET | Freq: Once | ORAL | Status: DC | PRN

## 2022-07-12 MED ORDER — SUGAMMADEX SODIUM 200 MG/2ML IV SOLN
INTRAVENOUS | Status: DC | PRN
  Administered 2022-07-12: 145.2 mg via INTRAVENOUS

## 2022-07-12 MED ORDER — ACETIC ACID 5 % SOLN
Status: DC | PRN
  Administered 2022-07-12: 1 via TOPICAL

## 2022-07-12 MED ORDER — FENTANYL CITRATE (PF) 100 MCG/2ML IJ SOLN
INTRAMUSCULAR | Status: AC
  Filled 2022-07-12: qty 2

## 2022-07-12 MED ORDER — ROCURONIUM BROMIDE 10 MG/ML (PF) SYRINGE
PREFILLED_SYRINGE | INTRAVENOUS | Status: AC
  Filled 2022-07-12: qty 10

## 2022-07-12 MED ORDER — DEXAMETHASONE SODIUM PHOSPHATE 4 MG/ML IJ SOLN
4.0000 mg | INTRAMUSCULAR | Status: AC
  Administered 2022-07-12: 4 mg via INTRAVENOUS

## 2022-07-12 MED ORDER — LIDOCAINE HCL (PF) 1 % IJ SOLN
INTRAMUSCULAR | Status: AC
  Filled 2022-07-12: qty 30

## 2022-07-12 MED ORDER — ACETAMINOPHEN 500 MG PO TABS: 1000.0000 mg | ORAL_TABLET | Freq: Once | ORAL | Status: DC

## 2022-07-12 MED ORDER — ONDANSETRON HCL 4 MG/2ML IJ SOLN
INTRAMUSCULAR | Status: DC | PRN
  Administered 2022-07-12: 4 mg via INTRAVENOUS

## 2022-07-12 MED ORDER — LIDOCAINE 2% (20 MG/ML) 5 ML SYRINGE
INTRAMUSCULAR | Status: DC | PRN
  Administered 2022-07-12: 100 mg via INTRAVENOUS

## 2022-07-12 MED ORDER — ACETAMINOPHEN 500 MG PO TABS
1000.0000 mg | ORAL_TABLET | ORAL | Status: DC
  Filled 2022-07-12: qty 2

## 2022-07-12 MED ORDER — FENTANYL CITRATE (PF) 100 MCG/2ML IJ SOLN
INTRAMUSCULAR | Status: DC | PRN
  Administered 2022-07-12: 100 ug via INTRAVENOUS

## 2022-07-12 MED ORDER — CHLORHEXIDINE GLUCONATE 0.12 % MT SOLN
15.0000 mL | Freq: Once | OROMUCOSAL | Status: AC
  Administered 2022-07-12: 15 mL via OROMUCOSAL

## 2022-07-12 MED ORDER — BUPIVACAINE HCL (PF) 0.25 % IJ SOLN
INTRAMUSCULAR | Status: AC
  Filled 2022-07-12: qty 30

## 2022-07-12 MED ORDER — AMISULPRIDE (ANTIEMETIC) 5 MG/2ML IV SOLN: 10.0000 mg | Freq: Once | INTRAVENOUS | Status: DC | PRN

## 2022-07-12 MED ORDER — DEXAMETHASONE SODIUM PHOSPHATE 10 MG/ML IJ SOLN
INTRAMUSCULAR | Status: AC
  Filled 2022-07-12: qty 1

## 2022-07-12 MED ORDER — HEPARIN SODIUM (PORCINE) 5000 UNIT/ML IJ SOLN
5000.0000 [IU] | INTRAMUSCULAR | Status: AC
  Administered 2022-07-12: 5000 [IU] via SUBCUTANEOUS
  Filled 2022-07-12: qty 1

## 2022-07-12 SURGICAL SUPPLY — 41 items
BLADE SURG 15 STRL LF DISP TIS (BLADE) ×1 IMPLANT
BLADE SURG 15 STRL SS (BLADE) ×1
BLADE SURG SZ11 CARB STEEL (BLADE) IMPLANT
BNDG GAUZE DERMACEA FLUFF 4 (GAUZE/BANDAGES/DRESSINGS) IMPLANT
BNDG GZE DERMACEA 4 6PLY (GAUZE/BANDAGES/DRESSINGS)
BRIEF MESH DISP LRG (UNDERPADS AND DIAPERS) IMPLANT
CATH ROBINSON RED A/P 16FR (CATHETERS) IMPLANT
COVER SURGICAL LIGHT HANDLE (MISCELLANEOUS) ×2 IMPLANT
ELECT PENCIL ROCKER SW 15FT (MISCELLANEOUS) IMPLANT
GAUZE 4X4 16PLY ~~LOC~~+RFID DBL (SPONGE) ×1 IMPLANT
GAUZE PAD ABD 8X10 STRL (GAUZE/BANDAGES/DRESSINGS) IMPLANT
GLOVE BIO SURGEON STRL SZ 6 (GLOVE) ×2 IMPLANT
GLOVE BIOGEL PI IND STRL 6.5 (GLOVE) ×1 IMPLANT
GOWN STRL REUS W/ TWL LRG LVL3 (GOWN DISPOSABLE) ×1 IMPLANT
GOWN STRL REUS W/TWL LRG LVL3 (GOWN DISPOSABLE) ×1
HIBICLENS CHG 4% 4OZ BTL (MISCELLANEOUS) ×1 IMPLANT
KIT TURNOVER KIT A (KITS) ×1 IMPLANT
NDL HYPO 22X1.5 SAFETY MO (MISCELLANEOUS) ×1 IMPLANT
NDL HYPO 25X1 1.5 SAFETY (NEEDLE) IMPLANT
NEEDLE HYPO 22X1.5 SAFETY MO (MISCELLANEOUS) ×1 IMPLANT
NEEDLE HYPO 25X1 1.5 SAFETY (NEEDLE) IMPLANT
PACK PERINEAL COLD (PAD) ×1 IMPLANT
PACK VAGINAL MINOR WOMEN LF (CUSTOM PROCEDURE TRAY) ×1 IMPLANT
PACK VAGINAL WOMENS (CUSTOM PROCEDURE TRAY) ×2 IMPLANT
PAD TELFA 2X3 NADH STRL (GAUZE/BANDAGES/DRESSINGS) IMPLANT
SUT VIC AB 2-0 CT1 18 (SUTURE) ×2 IMPLANT
SUT VIC AB 2-0 CT1 27 (SUTURE) ×1
SUT VIC AB 2-0 CT1 27XBRD (SUTURE) ×2 IMPLANT
SUT VIC AB 2-0 CT1 TAPERPNT 27 (SUTURE) IMPLANT
SUT VIC AB 2-0 SH 27 (SUTURE) ×1
SUT VIC AB 2-0 SH 27X BRD (SUTURE) ×1 IMPLANT
SUT VIC AB 3-0 CT1 27 (SUTURE) ×1
SUT VIC AB 3-0 CT1 TAPERPNT 27 (SUTURE) IMPLANT
SUT VIC AB 3-0 SH 27 (SUTURE) ×1
SUT VIC AB 3-0 SH 27X BRD (SUTURE) IMPLANT
SUT VIC AB 4-0 PS2 18 (SUTURE) ×1 IMPLANT
SUT VIC AB 4-0 SH 18 (SUTURE) IMPLANT
SYR BULB IRRIG 60ML STRL (SYRINGE) ×1 IMPLANT
TOWEL OR 17X26 10 PK STRL BLUE (TOWEL DISPOSABLE) ×2 IMPLANT
TRAY FOLEY MTR SLVR 16FR STAT (SET/KITS/TRAYS/PACK) IMPLANT
WATER STERILE IRR 1000ML POUR (IV SOLUTION) ×1 IMPLANT

## 2022-07-12 NOTE — Op Note (Signed)
GYNECOLOGIC ONCOLOGY OPERATIVE NOTE  Date of Service: 07/12/2022  Preoperative Diagnosis: VIN 3  Postoperative Diagnosis: Same  Procedures: Simple partial vulvectomy, advancement flap  Surgeon: Clide Cliff, MD  Assistants: None  Anesthesia: General  Estimated Blood Loss: 30 mL    Fluids: 800 ml, crystalloid  Urine Output: 200 ml, clear yellow  Findings: On exam, raised verrucous lesion of the posterior vulva crossing midline on the perineal body.  With acetic acid applied, acetowhite changes noted of this lesion.  Lesion does not directly abut the rectum and approaches the vaginal introitus.  Specimens:  ID Type Source Tests Collected by Time Destination  1 : Posterior vulva, long stitch at 12 o'clock, short stich towards anus Tissue PATH Gyn biopsy SURGICAL PATHOLOGY Clide Cliff, MD 07/12/2022 1115     Complications:  None  Indications for Procedure: Laura Mcgee is a 72 y.o. woman with VIN 3.  Prior to the procedure, all risks, benefits, and alternatives were discussed and informed surgical consent was signed.  Procedure: Patient was taken to the operating room where general anesthesia was achieved.  She was positioned in dorsal lithotomy and prepped and draped.   A thorough exam of the vulva was done after placing a moist sponge with acetic acid on the vulva with the findings as noted above. The area to be removed was outlined with a marking pen. A scalpel was used to incise around the lesion. Allis clamps are used to grasp the lesion and a skinning vulvectomy was performed in the standard fashion with aid of cautery. After the lesion was removed, the wound bed was made hemostatic with cautery.  The wound bed measured approximately 4 x 5 cm.  Given the location on the perineal body, decision was made to develop advancement flaps bilaterally to reapproximate the skin edges from left to right to cover across the perineal body.  The skin edge was grasped and elevated.  Using  electrocautery, the tissue was undermined approximately 1 to 2 cm on each side.  The skin edges were reapproximated and found to close the space, tension-free. Several deep 2-0 vicryl suture was placed to reapproximate the wound.  Then several 3-0 Vicryl sutures were placed in a deep dermal fashion.  Finally, the skin edges were reapproximated with several mattress stitches of 4-0 vicryl on the skin edges to re-appoximate the wound. Hemostasis was noted at the end of the procedure.   Patient tolerated the procedure well.  An In-and-Out catheterization was performed at the conclusion of the case.  Sponge, lap, and instrument counts were correct.  No perioperative antibiotics were indicated for this procedure.  Patient was extubated and taken to the PACU in stable condition.  Clide Cliff, MD Gynecologic Oncology

## 2022-07-12 NOTE — Interval H&P Note (Signed)
History and Physical Interval Note:  07/12/2022 10:27 AM  Laura Mcgee  has presented today for surgery, with the diagnosis of VIN 3.  The various methods of treatment have been discussed with the patient and family. After consideration of risks, benefits and other options for treatment, the patient has consented to  Procedure(s): EXAM UNDER ANESTHESIA (N/A) SIMPLE PARTIAL VULVECTOMY,POSSIBLE ROTATIONAL FLAP (N/A) as a surgical intervention.  The patient's history has been reviewed, patient examined, no change in status, stable for surgery.  I have reviewed the patient's chart and labs.  Questions were answered to the patient's satisfaction.     [name]

## 2022-07-12 NOTE — Anesthesia Postprocedure Evaluation (Signed)
Anesthesia Post Note  Patient: Laura Mcgee  Procedure(s) Performed: Francia Greaves UNDER ANESTHESIA SIMPLE PARTIAL VULVECTOMY, ADVANCEMENT FLAP     Patient location during evaluation: PACU Anesthesia Type: General Level of consciousness: awake Pain management: pain level controlled Vital Signs Assessment: post-procedure vital signs reviewed and stable Respiratory status: spontaneous breathing, nonlabored ventilation and respiratory function stable Cardiovascular status: blood pressure returned to baseline and stable Postop Assessment: no apparent nausea or vomiting Anesthetic complications: no   No notable events documented.  Last Vitals:  Vitals:   07/12/22 1300 07/12/22 1312  BP: 123/85 116/87  Pulse: 75 69  Resp: 14 14  Temp: 36.4 C   SpO2: 94% 93%    Last Pain:  Vitals:   07/12/22 1312  TempSrc:   PainSc: 0-No pain                 Linton Rump

## 2022-07-12 NOTE — Brief Op Note (Signed)
07/12/2022  12:03 PM  PATIENT:  Laura Mcgee  72 y.o. female  PRE-OPERATIVE DIAGNOSIS:  VIN 3  POST-OPERATIVE DIAGNOSIS:  VIN 3  PROCEDURE:  Procedure(s): EXAM UNDER ANESTHESIA (N/A) SIMPLE PARTIAL VULVECTOMY, ADVANCEMENT FLAP (N/A)  SURGEON:  Surgeon(s) and Role:    Clide Cliff, MD - Primary   ANESTHESIA:   general   EBL:  30 mL   BLOOD ADMINISTERED:none  DRAINS: none   LOCAL MEDICATIONS USED:  BUPIVICAINE   SPECIMEN:   ID Type Source Tests Collected by Time Destination  1 : Posterior vulva, long stitch at 12 o'clock, short stich towards anus Tissue PATH Gyn biopsy SURGICAL PATHOLOGY Clide Cliff, MD 07/12/2022 1115      DISPOSITION OF SPECIMEN:  PATHOLOGY  COUNTS:  YES  TOURNIQUET:  * No tourniquets in log *  DICTATION: .Note written in EPIC  PLAN OF CARE: Discharge to home after PACU  PATIENT DISPOSITION:  PACU - hemodynamically stable.   Delay start of Pharmacological VTE agent (>24hrs) due to surgical blood loss or risk of bleeding: not applicable

## 2022-07-12 NOTE — Anesthesia Procedure Notes (Signed)
Procedure Name: Intubation Date/Time: 07/12/2022 10:47 AM  Performed by: Epimenio Sarin, CRNAPre-anesthesia Checklist: Patient identified, Emergency Drugs available, Suction available, Patient being monitored and Timeout performed Patient Re-evaluated:Patient Re-evaluated prior to induction Oxygen Delivery Method: Circle system utilized Preoxygenation: Pre-oxygenation with 100% oxygen Induction Type: IV induction Ventilation: Mask ventilation without difficulty and Oral airway inserted - appropriate to patient size Laryngoscope Size: Mac and 3 Grade View: Grade I Tube type: Oral Tube size: 7.0 mm Number of attempts: 1 Airway Equipment and Method: Stylet Placement Confirmation: ETT inserted through vocal cords under direct vision, positive ETCO2 and breath sounds checked- equal and bilateral Secured at: 23 cm Tube secured with: Tape Dental Injury: Teeth and Oropharynx as per pre-operative assessment  Comments: No difficulty. MAC 3, grade 1 view. ETT easily passed.

## 2022-07-12 NOTE — Discharge Instructions (Addendum)
AFTER SURGERY INSTRUCTIONS   Return to work:  variable based on occupation   We recommend purchasing several bags of frozen green peas and dividing them into ziploc bags. You will want to keep these in the freezer and have them ready to use as ice packs to the vulvar incision. Once the ice pack is no longer cold, you can get another from the freezer. The frozen peas mold to your body better than a regular ice pack.   Additional hydrocodone/APAP has been sent to your pharmacy for breakthrough pain. You can now use the hydrocodone four times a day if needed for the post-op period.   Activity: 1. Be up and out of the bed during the day.  Take a nap if needed.  You may walk up steps but be careful and use the hand rail.  Stair climbing will tire you more than you think, you may need to stop part way and rest.    2. No lifting or straining for 4 weeks over 10 pounds. No pushing, pulling, straining for 4 weeks.   3. No driving for minimum 1 week after surgery.  Do not drive if you are taking narcotic pain medicine and make sure that your reaction time has returned.    4. You can shower as soon as the next day after surgery. Shower daily. No tub baths or submerging your body in water until cleared by your surgeon. If you have the soap that was given to you by pre-surgical testing that was used before surgery, you do not need to use it afterwards because this can irritate your incisions.    5. No sexual activity and nothing in the vagina for 6 weeks.   6. You may experience vaginal spotting and discharge after surgery.  The spotting is normal but if you experience heavy bleeding, call our office.   7. Take Tylenol or ibuprofen first for pain if you are able to take these medications and only use narcotic pain medication for severe pain not relieved by the Tylenol or Ibuprofen.  Monitor your Tylenol intake to a max of 4,000 mg in a 24 hour period. You can alternate these medications after surgery.    Diet: 1. Low sodium Heart Healthy Diet is recommended but you are cleared to resume your normal (before surgery) diet after your procedure.   2. It is safe to use a laxative, such as Miralax or Colace, if you have difficulty moving your bowels. You have been prescribed Sennakot at bedtime every evening to keep bowel movements regular and to prevent constipation.     Wound Care: 1. Keep clean and dry.  Shower daily.   Reasons to call the Doctor: Fever - Oral temperature greater than 100.4 degrees Fahrenheit Foul-smelling vaginal discharge Difficulty urinating Nausea and vomiting Increased pain at the site of the incision that is unrelieved with pain medicine. Difficulty breathing with or without chest pain New calf pain especially if only on one side Sudden, continuing increased vaginal bleeding with or without clots.   Contacts: For questions or concerns you should contact:   Dr. Clide Cliff at [phone]   Warner Mccreedy, NP at [phone]   After Hours: call [phone] and have the GYN Oncologist paged/contacted (after 5 pm or on the weekends).   Messages sent via mychart are for non-urgent matters and are not responded to after hours so for urgent needs, please call the after hours number.

## 2022-07-12 NOTE — Transfer of Care (Signed)
Immediate Anesthesia Transfer of Care Note  Patient: Laura Mcgee  Procedure(s) Performed: Francia Greaves UNDER ANESTHESIA SIMPLE PARTIAL VULVECTOMY, ADVANCEMENT FLAP  Patient Location: PACU  Anesthesia Type:General  Level of Consciousness: drowsy and patient cooperative  Airway & Oxygen Therapy: Patient Spontanous Breathing and Patient connected to face mask oxygen  Post-op Assessment: Report given to RN and Post -op Vital signs reviewed and stable  Post vital signs: Reviewed and stable  Last Vitals:  Vitals Value Taken Time  BP 137/83 07/12/22 1211  Temp 36.6 C 07/12/22 1211  Pulse 89 07/12/22 1213  Resp 18 07/12/22 1213  SpO2 100 % 07/12/22 1213  Vitals shown include unvalidated device data.  Last Pain:  Vitals:   07/12/22 1211  TempSrc:   PainSc: 0-No pain      Patients Stated Pain Goal: 5 (07/12/22 1610)  Complications: No notable events documented.

## 2022-07-13 ENCOUNTER — Telehealth: Payer: Self-pay | Admitting: *Deleted

## 2022-07-13 ENCOUNTER — Encounter (HOSPITAL_COMMUNITY): Payer: Self-pay | Admitting: Psychiatry

## 2022-07-13 NOTE — Telephone Encounter (Signed)
Attempted to reach patient for her post-op call. Voicemail left with office # [phone].

## 2022-07-14 NOTE — Telephone Encounter (Signed)
Attempted to call the patient a third time and phone went straight to voicemail. Called patient's daughter and left message on her voicemail for patient to call the office at [phone] in regards to post-op call.

## 2022-07-14 NOTE — Telephone Encounter (Signed)
2nd attempt to call patient for her post -op. Left voicemail for pt to call the office at [phone].

## 2022-07-15 LAB — SURGICAL PATHOLOGY

## 2022-07-18 ENCOUNTER — Telehealth: Payer: Self-pay | Admitting: Surgery

## 2022-07-18 ENCOUNTER — Telehealth: Payer: Self-pay | Admitting: Psychiatry

## 2022-07-18 NOTE — Telephone Encounter (Signed)
Called pt with her pathology results. Stage IA SCC of the vulva. Negative margins. We will discuss more at her postop visit. But nothing to do differently at this time. All questions answered.

## 2022-07-18 NOTE — Telephone Encounter (Signed)
Patient called requesting an explanation of her pathology results, stating she saw them in mychart and doesn't understand what they mean. Advised patient that Dr Alvester Morin would be notified and someone from our office will call her to discuss these results. Patient had no other concerns at this time.

## 2022-07-25 ENCOUNTER — Encounter: Payer: 59 | Admitting: Psychiatry

## 2022-08-03 ENCOUNTER — Inpatient Hospital Stay: Payer: 59 | Attending: Psychiatry | Admitting: Psychiatry

## 2022-08-03 VITALS — BP 128/78 | HR 102 | Temp 98.3°F | Resp 18 | Ht 66.93 in | Wt 164.8 lb

## 2022-08-03 DIAGNOSIS — D071 Carcinoma in situ of vulva: Secondary | ICD-10-CM | POA: Insufficient documentation

## 2022-08-03 DIAGNOSIS — C519 Malignant neoplasm of vulva, unspecified: Secondary | ICD-10-CM

## 2022-08-03 DIAGNOSIS — Z7189 Other specified counseling: Secondary | ICD-10-CM

## 2022-08-03 NOTE — Patient Instructions (Signed)
It was a pleasure to see you in clinic today. - Start doing sitz baths two times daily - Return visit planned for 4 weeks  Thank you very much for allowing me to provide care for you today.  I appreciate your confidence in choosing our Gynecologic Oncology team at [facility].  If you have any questions about your visit today please call our office or send [place] a MyChart message and we will get back to you as soon as possible.

## 2022-08-03 NOTE — Progress Notes (Signed)
Gynecologic Oncology Return Clinic Visit  Date of Service: 08/03/2022 Referring Provider: Cyril Mourning, NP [name], DO  Assessment & Plan: Laura Mcgee is a 72 y.o. woman with stage IA SCC of the vulva who is 3 weeks s/p simple partial vulvectomy with advancement flap on 07/12/22.  Postop: - Pt overall recovering well from surgery.  - Intraoperative findings and pathology results reviewed. - Ongoing postoperative expectations and precautions reviewed.  - Complete separation of wound but overall healthy appearing without signs/symptoms of infection. - Recommend sitz baths BID to help with healing by 2ndary intention. - Signs/symptoms of infection reviewed.   Vulvar cancer - Reviewed pathology results in detail. Microscopic cancer identified within vulvar dysplasia.  - Given only 1mm DOI, negative margins, okay to proceed with close surveillance.  - Reviewed risk of local/distant recurrence of vulvar cancer as well of risk of recurrence of vulvar dysplasia. - Recommend surveillance q28mo for the first 2 years. - Could consider pap in 1 year for evaluation of lower urogenital tract cancer surveillance, however, pt has no h/o abnormal pt smears by her report. Will reassess at a later time.  RTC 4 weeks for wound check.  Clide Cliff, MD Gynecologic Oncology   Medical Decision Making I personally spent  TOTAL 25 minutes face-to-face and non-face-to-face in the care of this patient, which includes all pre, intra, and post visit time on the date of service.    ----------------------- Reason for Visit: Postop/Treatment counseling  Treatment History: Oncology History  Vulvar cancer (HCC)  07/12/2022 Surgery   Simple partial vulvectomy, advancement flap    07/12/2022 Initial Diagnosis   Vulvar cancer (HCC)   07/12/2022 Pathology Results   FINAL MICROSCOPIC DIAGNOSIS:   A. VULVA, POSTERIOR, PARTIAL VULVECTOMY:  - Superficially invasive squamous cell carcinoma arising in  high grade  squamous intraepithelial lesion, HPV associated (see Comment)  - Margins not involved by high grade dysplasia or carcinoma   COMMENT:  - Morphologic evaluation reveals multifocal areas of superficial  invasion up to 0.1 cm depth of invasion. P53 shows wild type staining.  P16 is strong positive in the lesion. This case was reviewed with Dr. Reynolds Bowl  who agrees with the diagnosis.      Interval History: Pt reports that she is recovering well from surgery. She is still using her home hydrocodone for pain. She is eating and drinking well. She is voiding without issue and having regular bowel movements. She believes the wound has opened.    Past Medical/Surgical History: Past Medical History:  Diagnosis Date   Arthritis    Atrial fibrillation (HCC)    Cancer (HCC)    Vaginal   COPD (chronic obstructive pulmonary disease) (HCC)    Depression    Difficult intubation    History of laryngospasm   Dysrhythmia    GERD (gastroesophageal reflux disease)    Hyperlipidemia    Hypertension    Numbness and tingling    Pulmonary embolism (HCC) 1973    Past Surgical History:  Procedure Laterality Date   CERVIX LESION DESTRUCTION     throat   COLONOSCOPY WITH PROPOFOL N/A 04/07/2015   SLF: 1. seven colorectal polyps removed 2. the left colon is redundant 3. small internal hemorrhoids (1 simple adenoma, 6 hyperplastic) . Repeat in 5 years.   CYST EXCISION Left    Arm   ESOPHAGOGASTRODUODENOSCOPY (EGD) WITH PROPOFOL N/A 04/07/2015   SLF: 1. Schatzki ring 2. Bravo capsule 34 cm from the teeth 3. moderate non-erosive gastritis.  HUMERUS IM NAIL Right 06/25/2021   Procedure: INTRAMEDULLARY (IM) NAIL HUMERAL;  Surgeon: Oliver Barre, MD;  Location: [facility];  Service: Orthopedics;  Laterality: Right;   MASS EXCISION N/A 2002   Lanyx   MASS EXCISION Left 01/21/2015   Procedure: EXCISION CYST LEFT UPPER ARM;  Surgeon: Franky Macho, MD;  Location: [facility];  Service: General;  Laterality:  Left;   MICROLARYNGOSCOPY N/A 05/31/2016   Procedure: MICRO LARYNGOSCOPY WITH BIOPSY OF VOCAL CORD LESION;  Surgeon: Newman Pies, MD;  Location: [facility];  Service: ENT;  Laterality: N/A;   POLYPECTOMY N/A 04/07/2015   Procedure: POLYPECTOMY;  Surgeon: West Bali, MD;  Location: [facility];  Service: Endoscopy;  Laterality: N/A;  Descending colon polyps x 3    SAVORY DILATION N/A 04/07/2015   Procedure: SAVORY DILATION;  Surgeon: West Bali, MD;  Location: [facility];  Service: Endoscopy;  Laterality: N/A;   VULVECTOMY N/A 07/12/2022   Procedure: SIMPLE PARTIAL VULVECTOMY, ADVANCEMENT FLAP;  Surgeon: Clide Cliff, MD;  Location: [facility];  Service: Gynecology;  Laterality: N/A;    Family History  Problem Relation Age of Onset   Pneumonia Mother    Aneurysm Father    Kidney cancer Sister    Lung cancer Sister    Esophageal cancer Brother    Melanoma Maternal Grandfather    Diabetes Daughter    Colon cancer Neg Hx    Breast cancer Neg Hx    Ovarian cancer Neg Hx    Endometrial cancer Neg Hx    Pancreatic cancer Neg Hx    Prostate cancer Neg Hx     Social History   Socioeconomic History   Marital status: Single    Spouse name: Not on file   Number of children: 1   Years of education: some college   Highest education level: Not on file  Occupational History   Occupation: Retired  Tobacco Use   Smoking status: Every Day    Packs/day: 1.00    Years: 25.00    Additional pack years: 0.00    Total pack years: 25.00    Types: Cigarettes    Passive exposure: Current   Smokeless tobacco: Never  Vaping Use   Vaping Use: Never used  Substance and Sexual Activity   Alcohol use: Yes    Comment: Drinks two beer per night.   Drug use: Yes    Types: Marijuana    Comment: daily marijuana use   Sexual activity: Not Currently    Birth control/protection: Post-menopausal  Other Topics Concern   Not on file  Social History Narrative   Lives at home  alone.   Right-handed.   Two cups sweet tea daily.   Social Determinants of Health   Financial Resource Strain: Low Risk  (05/30/2022)   Overall Financial Resource Strain (CARDIA)    Difficulty of Paying Living Expenses: Not hard at all  Food Insecurity: No Food Insecurity (05/30/2022)   Hunger Vital Sign    Worried About Running Out of Food in the Last Year: Never true    Ran Out of Food in the Last Year: Never true  Transportation Needs: No Transportation Needs (05/30/2022)   PRAPARE - Administrator, Civil Service (Medical): No    Lack of Transportation (Non-Medical): No  Physical Activity: Inactive (05/30/2022)   Exercise Vital Sign    Days of Exercise per Week: 0 days    Minutes of Exercise per Session: 0 min  Stress: No Stress Concern Present (05/30/2022)   [employer] of Occupational Health - Occupational Stress Questionnaire    Feeling of Stress : Not at all  Social Connections: Socially Isolated (05/30/2022)   Social Connection and Isolation Panel [NHANES]    Frequency of Communication with Friends and Family: Once a week    Frequency of Social Gatherings with Friends and Family: Never    Attends Religious Services: Never    Database administrator or Organizations: No    Attends Engineer, structural: Never    Marital Status: Divorced    Current Medications:  Current Outpatient Medications:    amLODipine (NORVASC) 5 MG tablet, Take 5 mg by mouth daily., Disp: , Rfl:    cholecalciferol (VITAMIN D3) 25 MCG (1000 UNIT) tablet, Take 1,000 Units by mouth daily., Disp: , Rfl:    docusate sodium (COLACE) 100 MG capsule, Take 100 mg by mouth every other day., Disp: , Rfl:    DULoxetine (CYMBALTA) 60 MG capsule, Take 1 capsule (60 mg total) by mouth daily. (Patient taking differently: Take 60 mg by mouth 2 (two) times daily.), Disp: 30 capsule, Rfl: 0   HYDROcodone-acetaminophen (NORCO) 10-325 MG tablet, Take 0.5 tablets by mouth 3 (three) times daily as  needed., Disp: 10 tablet, Rfl: 0   HYDROcodone-acetaminophen (NORCO) 10-325 MG tablet, Take 1 tablet by mouth every 6 (six) hours as needed for severe pain (breakthrough pain). For breakthrough post-op pain in addition to existing pain regimen. Can take hydrocodone/APAP four times a day as needed, Disp: 15 tablet, Rfl: 0   lovastatin (MEVACOR) 20 MG tablet, Take 20 mg by mouth daily., Disp: , Rfl:    olmesartan (BENICAR) 20 MG tablet, Take 20 mg by mouth daily., Disp: , Rfl:    ondansetron (ZOFRAN) 4 MG tablet, Take 4 mg by mouth 3 (three) times daily as needed., Disp: , Rfl:    pantoprazole (PROTONIX) 40 MG tablet, Take 1 tablet (40 mg total) by mouth daily before breakfast., Disp: 30 tablet, Rfl: 5   Propylene Glycol (SYSTANE COMPLETE OP), Place 1 drop into both eyes daily as needed (dry eyes)., Disp: , Rfl:    senna-docusate (SENOKOT-S) 8.6-50 MG tablet, Take 2 tablets by mouth at bedtime. For AFTER surgery, do not take if having diarrhea, Disp: 30 tablet, Rfl: 0   venlafaxine XR (EFFEXOR-XR) 150 MG 24 hr capsule, Take 2 capsules (300 mg total) by mouth every evening., Disp: 60 capsule, Rfl: 0  Review of Symptoms: Complete 10-system review is positive for: Change in appetite, shortness of breath, constipation, easy bruising/bleeding, bloating, back pain, fatigue, abdominal pain, dizziness, increased anger, nausea  Physical Exam: BP 128/78 (BP Location: Left Arm, Patient Position: Sitting)   Pulse (!) 102   Temp 98.3 F (36.8 C) (Oral)   Resp 18   Ht 5' 6.93" (1.7 m)   Wt 164 lb 12.8 oz (74.8 kg)   LMP 06/20/1997 (Approximate) Comment: post menopausal  SpO2 98%   BMI 25.87 kg/m  General: Alert, oriented, no acute distress. HEENT: Normocephalic, atraumatic. Neck symmetric without masses. Sclera anicteric.  Chest: Normal work of breathing. Clear to auscultation bilaterally.   Cardiovascular: Regular rate and rhythm, no murmurs. Abdomen: Soft, nontender.   Extremities: Grossly normal  range of motion.  Warm, well perfused.  No edema bilaterally. Skin: No rashes or lesions noted. GU: External genitalia with evidence of posterior vulvectomy of the perineal body.  Complete opening of lumpectomy wound.  Several sutures removed with scissors.  Area  irrigated with saline and overall well-appearing without signs or symptoms of infection.  Small area of friability from removing suture treated with silver nitrate.  Exam chaperoned by Warner Mccreedy, NP   Laboratory & Radiologic Studies: FINAL MICROSCOPIC DIAGNOSIS:   A. VULVA, POSTERIOR, PARTIAL VULVECTOMY:  - Superficially invasive squamous cell carcinoma arising in high grade  squamous intraepithelial lesion, HPV associated (see Comment)  - Margins not involved by high grade dysplasia or carcinoma   COMMENT:  - Morphologic evaluation reveals multifocal areas of superficial  invasion up to 0.1 cm depth of invasion. P53 shows wild type staining.  P16 is strong positive in the lesion. This case was reviewed with Dr. Reynolds Bowl  who agrees with the diagnosis.

## 2022-08-10 ENCOUNTER — Encounter: Payer: Self-pay | Admitting: Psychiatry

## 2022-08-17 ENCOUNTER — Ambulatory Visit (HOSPITAL_COMMUNITY)
Admission: RE | Admit: 2022-08-17 | Discharge: 2022-08-17 | Disposition: A | Discharge status: Home / Self Care | Payer: 59 | Source: Ambulatory Visit | Attending: Nurse Practitioner | Admitting: Nurse Practitioner

## 2022-08-17 ENCOUNTER — Other Ambulatory Visit (HOSPITAL_COMMUNITY): Payer: Self-pay | Admitting: Family Medicine

## 2022-08-17 ENCOUNTER — Ambulatory Visit (HOSPITAL_COMMUNITY)
Admission: RE | Admit: 2022-08-17 | Discharge: 2022-08-17 | Disposition: A | Discharge status: Home / Self Care | Payer: 59 | Source: Ambulatory Visit | Attending: Family Medicine | Admitting: Family Medicine

## 2022-08-17 ENCOUNTER — Other Ambulatory Visit (HOSPITAL_COMMUNITY): Payer: Self-pay | Admitting: Nurse Practitioner

## 2022-08-17 DIAGNOSIS — R053 Chronic cough: Secondary | ICD-10-CM | POA: Insufficient documentation

## 2022-08-17 DIAGNOSIS — R519 Headache, unspecified: Secondary | ICD-10-CM | POA: Diagnosis present

## 2022-08-17 DIAGNOSIS — R296 Repeated falls: Secondary | ICD-10-CM | POA: Insufficient documentation

## 2022-08-30 ENCOUNTER — Ambulatory Visit (INDEPENDENT_AMBULATORY_CARE_PROVIDER_SITE_OTHER): Payer: 59 | Admitting: Orthopedic Surgery

## 2022-08-30 ENCOUNTER — Encounter: Payer: Self-pay | Admitting: Orthopedic Surgery

## 2022-08-30 ENCOUNTER — Other Ambulatory Visit (INDEPENDENT_AMBULATORY_CARE_PROVIDER_SITE_OTHER): Payer: 59

## 2022-08-30 VITALS — BP 152/104 | HR 106 | Ht 66.0 in | Wt 165.0 lb

## 2022-08-30 DIAGNOSIS — T8484XA Pain due to internal orthopedic prosthetic devices, implants and grafts, initial encounter: Secondary | ICD-10-CM

## 2022-08-30 DIAGNOSIS — S42331D Displaced oblique fracture of shaft of humerus, right arm, subsequent encounter for fracture with routine healing: Secondary | ICD-10-CM

## 2022-08-30 DIAGNOSIS — S42291S Other displaced fracture of upper end of right humerus, sequela: Secondary | ICD-10-CM | POA: Diagnosis not present

## 2022-08-30 NOTE — Patient Instructions (Signed)
Please call the clinic when you are ready to proceed with removal of hardware.  We will be able to schedule any time in August

## 2022-08-30 NOTE — Progress Notes (Signed)
Orthopaedic Postop Note  Assessment: Laura Mcgee is a 72 y.o. female s/p IM nail of right humerus shaft fracture  DOS: 06/25/2021  Plan: Laura Mcgee continues to have right shoulder pain. Her motion has improved, but she continues to have some sharp pains.  She reports that she has had a few falls since I saw her last.  She has also had multiple medical issues.  Repeat radiographs today demonstrates that the proximal screw has shifted somewhat, and.  Arthritis is advanced.  Sharp pains could be related to the screw.  In addition, the arthritis can be causing him discomfort.  She remains interested in having the hardware removed, including intramedullary nail.  She would like to wait at this point, as she has follow-up scheduled with her OB/GYN for next week.  If this visit goes well, she would like to consider surgery early August.  The procedure has previously been discussed with her.  All questions were answered once again.  The plan will be to remove the hardware, and then treat her symptoms if she has any problems with arthritis.  We could consider a shoulder replacement in the future, but she states she has very little interest in a big surgery at this time.   Follow-up:  Once medically cleared for surgery  XR at next visit: TBD  Subjective:  Chief Complaint  Patient presents with   Shoulder Pain    R shoulder here to discuss surgery again for hardware removal.    History of Present Illness: Laura Mcgee is a 72 y.o. female who presents following the above stated procedure.  Surgery was over a year ago.  She continues to have pain in the right shoulder.  Some pain radiating down the right arm.  Since I last saw her, she has had some medical issues, and surgery to remove a cancerous growth.  She is scheduled to see her OB/GYN next week.  Hopefully, she will find out some good news.  She continues to use the right arm, and notes more function.  However, she continues to have  occasional sharp pains in the right shoulder.  She is difficulty with overhead motion.  Since I last saw her, she has fallen several times.  This has caused severe pain in the arm, which radiates distally.  review of Systems: No fevers or chills No numbness or tingling No Chest Pain No shortness of breath   Objective: BP (!) 152/104   Pulse (!) 106   Ht 5\' 6"  (1.676 m)   Wt 165 lb (74.8 kg)   LMP 06/20/1997 (Approximate) Comment: post menopausal  BMI 26.63 kg/m   Physical Exam:  Alert and oriented.  No acute distress.  Surgical incisions are healed.  No surrounding erythema or drainage.  Mild deformity in the anterior upper arm.  She tolerates 80 degrees of abduction at her side.  110 degrees of forward flexion.  Limited external rotation.  She has occasional sharp pains.  Fingers are warm and well-perfused.  Sensation intact throughout the right hand.  Sensation intact in the axillary nerve distribution.  IMAGING: I personally ordered and reviewed the following images:  X-rays right shoulder were obtained in clinic today.  No acute injuries noted.  Prominent screw abutting the glenohumeral joint.  Advanced degenerative changes of the glenohumeral joint.  Fracture of the midshaft of the humerus is healed.  Distal interlocking screw remains unchanged, with partial backing out.  No evidence of proximal humeral migration.  Impression: Right shoulder x-ray with advanced  degenerative changes, prominent screw.  Humeral shaft fracture is healed.  Oliver Barre, MD 08/30/2022 4:19 PM

## 2022-08-31 ENCOUNTER — Telehealth: Payer: Self-pay | Admitting: Orthopedic Surgery

## 2022-08-31 NOTE — Telephone Encounter (Signed)
[facility]    Patient called at 3:15 pm left voicemail wanting to talk with Dr. Dallas Schimke.   I called the patient back and left voicemail to call the office back    Leta please call this patient back [phone]  Thanks,

## 2022-09-01 NOTE — Telephone Encounter (Signed)
Dr. Dallas Schimke pt----- Patient left message to be called and I returned her call but had to leave a message for her to call the office back

## 2022-09-01 NOTE — Telephone Encounter (Signed)
Spoke with pt who states she will be able to have surgery after 10/10/22, let pt know the next surgery date after the 5th will be 10/17/22. Pt would like to go ahead and plan for surgery on this date.

## 2022-09-02 ENCOUNTER — Telehealth: Payer: Self-pay | Admitting: *Deleted

## 2022-09-02 NOTE — Telephone Encounter (Signed)
Spoke with Laura Mcgee to review her medication list for her appointment with Dr. Alvester Morin on Monday, July 1at 1330. Patient states, "I have to cancel my appointment, I broke my arm and I will be having surgery". Patient's appt. Was canceled and she will call the office back to reschedule.

## 2022-09-05 ENCOUNTER — Inpatient Hospital Stay: Payer: 59 | Admitting: Psychiatry

## 2022-09-11 NOTE — Progress Notes (Signed)
This encounter was created in error - please disregard.

## 2022-10-10 ENCOUNTER — Telehealth: Payer: Self-pay | Admitting: *Deleted

## 2022-10-10 NOTE — Telephone Encounter (Signed)
Spoke with Laura Mcgee who called the office to schedule a follow up appt. That she missed in July. Patient was given an appointment on Monday, September 16 th at 2:45 pm. Pt agreed to date and time.

## 2022-10-18 ENCOUNTER — Ambulatory Visit (INDEPENDENT_AMBULATORY_CARE_PROVIDER_SITE_OTHER): Payer: 59 | Admitting: Orthopedic Surgery

## 2022-10-18 DIAGNOSIS — T8484XA Pain due to internal orthopedic prosthetic devices, implants and grafts, initial encounter: Secondary | ICD-10-CM

## 2022-10-18 DIAGNOSIS — S42291D Other displaced fracture of upper end of right humerus, subsequent encounter for fracture with routine healing: Secondary | ICD-10-CM

## 2022-10-18 DIAGNOSIS — S42291S Other displaced fracture of upper end of right humerus, sequela: Secondary | ICD-10-CM | POA: Diagnosis not present

## 2022-10-18 NOTE — Patient Instructions (Signed)
Your surgery will be at [facility], scheduled with Dr Thane Edu   The hospital will contact you with a preoperative appointment to discuss Anesthesia. The phone number is [phone]   Please bring your medications with you for the appointment.  They will tell you the arrival time and medication instructions when you have your preoperative evaluation.  Do not wear nail polish the day of your surgery and if you take Phentermine you need to stop this medication ONE WEEK prior to your surgery.    If you take an blood thinning medication, we will need to stop this prior to surgery.  Typically, we stop this medicine at least 5 days prior to surgery.  We will need to confirm this with the doctor who prescribes this medication.  If you are taking medications or an injection for diabetes, or for weight management, this medicine will need to be stopped at least 7 days prior to surgery.     Surgery will be scheduled for 10/31/22 pending authorization by your insurance company.

## 2022-10-18 NOTE — Progress Notes (Unsigned)
She just comes in today to get a surgery date.  She states she has a lot of arm pain and it is getting worse shooting down her arm.

## 2022-10-19 ENCOUNTER — Telehealth: Payer: Self-pay | Admitting: Orthopedic Surgery

## 2022-10-19 ENCOUNTER — Encounter: Payer: Self-pay | Admitting: Orthopedic Surgery

## 2022-10-19 NOTE — Telephone Encounter (Signed)
LVM for a call back

## 2022-10-19 NOTE — Telephone Encounter (Signed)
Patient called at 1:14 pm left message stating she wants to talk with Dr. Dallas Schimke or his nurse today.   Her phone number is [phone]

## 2022-10-19 NOTE — Telephone Encounter (Signed)
Pt came in to the office stating she needs to move surgery to after Labor Day, she will not have a driver until then. I mentioned 11/14/22 to her for a possible reschedule date but let her know I will verify with you first.

## 2022-10-19 NOTE — Telephone Encounter (Signed)
I will make a note of possible date of 9/9 for patient. Clearance from PCP needs to be obtained first. I will call patient to finalize a surgery date once clearance is received.

## 2022-10-19 NOTE — Telephone Encounter (Signed)
Dr. Dallas Schimke pt - pt lvm stating she was seen yesterday, she wants to talk to Dr. Dallas Schimke assistant about her surgery date.

## 2022-10-19 NOTE — Progress Notes (Signed)
Orthopaedic Postop Note  Assessment: Laura Mcgee is a 73 y.o. female s/p IM nail of right humerus shaft fracture  DOS: 06/25/2021  Plan: Laura Mcgee continues to have right shoulder pain.  We have previously discussed removal of the humeral nail, and associated screws from the right humerus.  She would like to proceed with surgery soon as possible.  She has recently had surgery, and anticipates that medical clearance will not be an issue.  She would like to proceed on October 31, 2022.  Risks and benefits of the surgery, including, but not limited to infection, bleeding, persistent pain, need for further surgery, non-union, malunion, shoulder pain and more severe complications associated with anesthesia were discussed with the patient.  The patient has elected to proceed.  Follow-up: After surgery; DOS 10/31/2022  XR at next visit: TBD  Subjective:  Chief Complaint  Patient presents with   Arm Pain    R arm     History of Present Illness: Laura Mcgee is a 72 y.o. female who returns following the above stated procedure.  Surgery was over a year ago.  We have previously discussed removal of the hardware.  She is ready to proceed.  She continues to have pain and limited motion in her right shoulder.  review of Systems: No fevers or chills No numbness or tingling No Chest Pain No shortness of breath   Objective: LMP 06/20/1997 (Approximate) Comment: post menopausal  Physical Exam:  Alert and oriented.  No acute distress.  Surgical incisions are healed.  No surrounding erythema or drainage.  Mild deformity in the anterior upper arm.  She tolerates 80 degrees of abduction at her side.  110 degrees of forward flexion.  Limited external rotation.  She has occasional sharp pains.  Fingers are warm and well-perfused.  Sensation intact throughout the right hand.  Sensation intact in the axillary nerve distribution.  IMAGING: I personally ordered and reviewed the following  images:  No new imaging obtained today.  Oliver Barre, MD 10/19/2022 7:52 AM

## 2022-10-28 ENCOUNTER — Ambulatory Visit: Payer: Self-pay

## 2022-10-28 NOTE — Telephone Encounter (Signed)
  Chief Complaint: Falls  - neuropathy Symptoms: Falls - blacking out Frequency: Ongoing Pertinent Negatives: Patient denies  Disposition: [x] ED /[] Urgent Care (no appt availability in office) / [] Appointment(In office/virtual)/ []  [facility]/ [] Home Care/ [] Refused Recommended Disposition /[] [place] [facility]/ []  Follow-up with PCP Additional Notes: Pt has a hx of falls. Pt fell last night and hit her head on a room divider. Pt states her legs went out. Pt states that she has blacked out and fallen as well.  Pt states that new PCP called and cancelled appt. Pt also needs Neuro consult. Chart shows that pt cancelled an upcoming neuro appt, pt denies this. Pt also states that she cannot travel to [place] to see Neuro. Frequent falls and blacking out is very concerning, as well as head injury. Pt advise to call PCP and reschedule NP appt. Pt advised to go to ED for care.  Reason for Disposition  [1] MODERATE weakness (i.e., interferes with work, school, normal activities) AND [2] new-onset or worsening  Answer Assessment - Initial Assessment Questions 1. MECHANISM: "How did the fall happen?"     Legs gave out - grabbed room divider 3. ONSET: "When did the fall happen?" (e.g., minutes, hours, or days ago)     Last night 4. LOCATION: "What part of the body hit the ground?" (e.g., back, buttocks, head, hips, knees, hands, head, stomach)     Hit head 5. INJURY: "Did you hurt (injure) yourself when you fell?" If Yes, ask: "What did you injure? Tell me more about this?" (e.g., body area; type of injury; pain severity)"     Head 9. OTHER SYMPTOMS: "Do you have any other symptoms?" (e.g., dizziness, fever, weakness; new onset or worsening).      Dizziness 10. CAUSE: "What do you think caused the fall (or falling)?" (e.g., tripped, dizzy spell)       Legs gave out, neuropathy  Protocols used: Falls and [facility]

## 2022-11-08 ENCOUNTER — Telehealth: Payer: Self-pay

## 2022-11-08 NOTE — Telephone Encounter (Signed)
Patient left message wanting Dr. Dallas Schimke' nurse to call her back in regards to her surgery on 11/14/22.  [phone]

## 2022-11-08 NOTE — Telephone Encounter (Signed)
Called pt who is upset because she's not scheduled for surgery on 9/9. Let pt know that we have to have current medical clearances to move forward with surgery, states Cardiology has cleared her and that Dr. Sudie Bailey was her PCP but he retired. Not sure what's missing at this point but pt would like to know what else is needed so she can have painful hardware removed. Please assist.

## 2022-11-09 NOTE — Telephone Encounter (Signed)
Clearance is still needed from GP, per Dr. Dallas Schimke request. Nothing has been received as of today. Cardiac clearance was given 06/28/22 probably for original surgery. I will contact GP today to request update on clearance status.

## 2022-11-10 NOTE — Telephone Encounter (Signed)
Attempted to call pt, left detailed VM with information and call back # for questions.

## 2022-11-10 NOTE — Telephone Encounter (Signed)
I received this from schedulers:   FYI: re Laura Mcgee. The patient has an appointment with Dr. Jamesetta Orleans on 11/29/22 to establish care. (she r/s that appt multiple times per [facility] office) I am refaxing surgery clearance request to them so they will have it for her appt on 9/24.     Please let me know what I should advise the patient at this time. Thank you.

## 2022-11-11 ENCOUNTER — Telehealth: Payer: Self-pay

## 2022-11-11 NOTE — Telephone Encounter (Signed)
Patient called stating that she didn't understand all of your message and she would like to have a return call, please. Stated she has some questions.  Please call and advise  [phone]

## 2022-11-15 ENCOUNTER — Telehealth: Payer: Self-pay | Admitting: Orthopedic Surgery

## 2022-11-15 NOTE — Telephone Encounter (Signed)
Dr. Dallas Schimke pt - returned the patients call, she only wants to speak to Dr. Dallas Schimke nurse.  She stated that the nurse called her the other day and she doesn't understand.  She also stated it's in reference to her surgery.  [phone]

## 2022-11-16 ENCOUNTER — Telehealth: Payer: Self-pay | Admitting: Radiology

## 2022-11-16 NOTE — Telephone Encounter (Signed)
Spoke with pt who states Dr. Sudie Bailey did clear her before he retired. Let pt know that if she would like to bring that information here I will give it to Dr. Dallas Schimke for review. I did also let pt know that he may still need the clearance from new PCP.

## 2022-11-16 NOTE — Telephone Encounter (Signed)
Called and left VM with information from Dr. Dallas Schimke again.

## 2022-11-16 NOTE — Telephone Encounter (Signed)
Patient called again today, really anxious to speak with Leta again, she did not understand everything said to her about the surgery.  She also has medical records/info available for Dr Dallas Schimke to review if he wants to.    Thanks,   Toniann Fail

## 2022-11-16 NOTE — Telephone Encounter (Signed)
Dr. Dallas Schimke pt - pt lvm stating she missed Leta's call while she was in [place].  She would like a call back [phone]

## 2022-11-21 ENCOUNTER — Inpatient Hospital Stay: Payer: 59 | Attending: Psychiatry | Admitting: Psychiatry

## 2022-11-21 VITALS — BP 148/98 | HR 98 | Temp 98.1°F | Resp 16 | Wt 154.4 lb

## 2022-11-21 DIAGNOSIS — S42291S Other displaced fracture of upper end of right humerus, sequela: Secondary | ICD-10-CM

## 2022-11-21 DIAGNOSIS — G8929 Other chronic pain: Secondary | ICD-10-CM | POA: Diagnosis not present

## 2022-11-21 DIAGNOSIS — R300 Dysuria: Secondary | ICD-10-CM | POA: Diagnosis not present

## 2022-11-21 DIAGNOSIS — Z9079 Acquired absence of other genital organ(s): Secondary | ICD-10-CM | POA: Diagnosis not present

## 2022-11-21 DIAGNOSIS — C519 Malignant neoplasm of vulva, unspecified: Secondary | ICD-10-CM | POA: Diagnosis present

## 2022-11-21 MED ORDER — HYDROCODONE-ACETAMINOPHEN 10-325 MG PO TABS
1.0000 | ORAL_TABLET | Freq: Three times a day (TID) | ORAL | 0 refills | Status: DC
Start: 2022-11-21 — End: 2022-11-22

## 2022-11-21 MED ORDER — LIDOCAINE HCL URETHRAL/MUCOSAL 2 % EX GEL
1.0000 | CUTANEOUS | 3 refills | Status: AC | PRN
Start: 2022-11-21 — End: ?

## 2022-11-21 NOTE — Patient Instructions (Signed)
It was a pleasure to see you in clinic today. - You can use the topical lidocaine as need for burning/pain. - Return visit planned for 31mo  Thank you very much for allowing me to provide care for you today.  I appreciate your confidence in choosing our Gynecologic Oncology team at [facility].  If you have any questions about your visit today please call our office or send [place] a MyChart message and we will get back to you as soon as possible.

## 2022-11-21 NOTE — Progress Notes (Unsigned)
Gynecologic Oncology Return Clinic Visit  Date of Service: 11/21/2022 Referring Provider: Cyril Mourning, NP Laura Hidalgo, DO  Assessment & Plan: Laura Mcgee is a 72 y.o. 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  Vulvar cancer: - Has now well healed from prior resection Can use topical lidocaine as needed for sensitivity at scar. - Given only 1mm DOI, negative margins, okay to proceed with close surveillance.  - Signs/symptoms of recurrence reviewed. - Recommend surveillance q22mo for the first 2 years. - Could consider pap in 1 year for evaluation of lower urogenital tract cancer surveillance, however, pt has no h/o abnormal pt smears by her report. Will reassess at a later time.  Chronic pain: - Pt reports that her prior PCP who prescribed her hydrocodone has retired and she transitions care to new PCP in 1 week. She requests a bridge rx. - Rx for 1 week to bridge to new pcp appointment. Reviewed that I will not be continuing prescription beyond this.  RTC 6 months  Clide Cliff, MD Gynecologic Oncology   Medical Decision Making I personally spent  TOTAL 23 minutes face-to-face and non-face-to-face in the care of this patient, which includes all pre, intra, and post visit time on the date of service.    ----------------------- Reason for Visit: Follow-up  Treatment History: Oncology History  Vulvar cancer (HCC)  07/12/2022 Surgery   Simple partial vulvectomy, advancement flap    07/12/2022 Initial Diagnosis   Vulvar cancer (HCC)   07/12/2022 Pathology Results   FINAL MICROSCOPIC DIAGNOSIS:   A. VULVA, POSTERIOR, PARTIAL VULVECTOMY:  - Superficially invasive squamous cell carcinoma arising in high grade  squamous intraepithelial lesion, HPV associated (see Comment)  - Margins not involved by high grade dysplasia or carcinoma   COMMENT:  - Morphologic evaluation reveals  multifocal areas of superficial  invasion up to 0.1 cm depth of invasion. P53 shows wild type staining.  P16 is strong positive in the lesion. This case was reviewed with Dr. Reynolds Bowl  who agrees with the diagnosis.    07/12/2022 Cancer Staging   Staging form: Vulva, AJCC V9 - Pathologic stage from 07/12/2022: FIGO Stage IA (pT1a, pNX, cM0) - Signed by Clide Cliff, MD on 08/10/2022 Histopathologic type: Squamous cell carcinoma, NOS Stage prefix: Initial diagnosis     Interval History: Reports that she is overall doing well.  Has burning occasionally when she pees with the burning on the outside.  She is still using the Peri bottle to rinse when she urinates to help.  Denies bleeding.  Occasional itching.  Not sure if the area has fully healed over or not yet.   Past Medical/Surgical History: Past Medical History:  Diagnosis Date   Arthritis    Atrial fibrillation (HCC)    Cancer (HCC)    Vaginal   COPD (chronic obstructive pulmonary disease) (HCC)    Depression    Difficult intubation    History of laryngospasm   Dysrhythmia    GERD (gastroesophageal reflux disease)    Hyperlipidemia    Hypertension    Numbness and tingling    Pulmonary embolism (HCC) 1973    Past Surgical History:  Procedure Laterality Date   CERVIX LESION DESTRUCTION     throat   COLONOSCOPY WITH PROPOFOL N/A 04/07/2015   SLF: 1. seven colorectal polyps removed 2. the left colon is redundant 3. small internal hemorrhoids (1 simple adenoma, 6 hyperplastic) . Repeat in 5  years.   CYST EXCISION Left    Arm   ESOPHAGOGASTRODUODENOSCOPY (EGD) WITH PROPOFOL N/A 04/07/2015   SLF: 1. Schatzki ring 2. Bravo capsule 34 cm from the teeth 3. moderate non-erosive gastritis.    HUMERUS IM NAIL Right 06/25/2021   Procedure: INTRAMEDULLARY (IM) NAIL HUMERAL;  Surgeon: Oliver Barre, MD;  Location: [facility];  Service: Orthopedics;  Laterality: Right;   MASS EXCISION N/A 2002   Lanyx   MASS EXCISION Left 01/21/2015    Procedure: EXCISION CYST LEFT UPPER ARM;  Surgeon: Franky Macho, MD;  Location: [facility];  Service: General;  Laterality: Left;   MICROLARYNGOSCOPY N/A 05/31/2016   Procedure: MICRO LARYNGOSCOPY WITH BIOPSY OF VOCAL CORD LESION;  Surgeon: Newman Pies, MD;  Location: [facility];  Service: ENT;  Laterality: N/A;   POLYPECTOMY N/A 04/07/2015   Procedure: POLYPECTOMY;  Surgeon: West Bali, MD;  Location: [facility];  Service: Endoscopy;  Laterality: N/A;  Descending colon polyps x 3    SAVORY DILATION N/A 04/07/2015   Procedure: SAVORY DILATION;  Surgeon: West Bali, MD;  Location: [facility];  Service: Endoscopy;  Laterality: N/A;   VULVECTOMY N/A 07/12/2022   Procedure: SIMPLE PARTIAL VULVECTOMY, ADVANCEMENT FLAP;  Surgeon: Clide Cliff, MD;  Location: [facility];  Service: Gynecology;  Laterality: N/A;    Family History  Problem Relation Age of Onset   Pneumonia Mother    Aneurysm Father    Kidney cancer Sister    Lung cancer Sister    Esophageal cancer Brother    Melanoma Maternal Grandfather    Diabetes Daughter    Colon cancer Neg Hx    Breast cancer Neg Hx    Ovarian cancer Neg Hx    Endometrial cancer Neg Hx    Pancreatic cancer Neg Hx    Prostate cancer Neg Hx     Social History   Socioeconomic History   Marital status: Single    Spouse name: Not on file   Number of children: 1   Years of education: some college   Highest education level: Not on file  Occupational History   Occupation: Retired  Tobacco Use   Smoking status: Every Day    Current packs/day: 1.00    Average packs/day: 1 pack/day for 25.0 years (25.0 ttl pk-yrs)    Types: Cigarettes    Passive exposure: Current   Smokeless tobacco: Never  Vaping Use   Vaping status: Never Used  Substance and Sexual Activity   Alcohol use: Yes    Comment: Drinks two beer per night.   Drug use: Yes    Types: Marijuana    Comment: daily marijuana use   Sexual activity: Not Currently    Birth  control/protection: Post-menopausal  Other Topics Concern   Not on file  Social History Narrative   Lives at home alone.   Right-handed.   Two cups sweet tea daily.   Social Determinants of Health   Financial Resource Strain: Low Risk  (05/30/2022)   Overall Financial Resource Strain (CARDIA)    Difficulty of Paying Living Expenses: Not hard at all  Food Insecurity: No Food Insecurity (05/30/2022)   Hunger Vital Sign    Worried About Running Out of Food in the Last Year: Never true    Ran Out of Food in the Last Year: Never true  Transportation Needs: No Transportation Needs (05/30/2022)   PRAPARE - Transportation    Lack of Transportation (Medical): No    Lack of  Transportation (Non-Medical): No  Physical Activity: Inactive (05/30/2022)   Exercise Vital Sign    Days of Exercise per Week: 0 days    Minutes of Exercise per Session: 0 min  Stress: No Stress Concern Present (05/30/2022)   [employer] of Occupational Health - Occupational Stress Questionnaire    Feeling of Stress : Not at all  Social Connections: Socially Isolated (05/30/2022)   Social Connection and Isolation Panel [NHANES]    Frequency of Communication with Friends and Family: Once a week    Frequency of Social Gatherings with Friends and Family: Never    Attends Religious Services: Never    Database administrator or Organizations: No    Attends Engineer, structural: Never    Marital Status: Divorced    Current Medications:  Current Outpatient Medications:    amLODipine (NORVASC) 5 MG tablet, Take 5 mg by mouth in the morning., Disp: , Rfl:    docusate sodium (COLACE) 100 MG capsule, Take 100 mg by mouth every other day., Disp: , Rfl:    lidocaine (XYLOCAINE) 2 % jelly, Apply 1 Application topically as needed., Disp: 30 mL, Rfl: 3   lovastatin (MEVACOR) 20 MG tablet, Take 20 mg by mouth in the morning., Disp: , Rfl:    olmesartan (BENICAR) 20 MG tablet, Take 20 mg by mouth in the morning., Disp: ,  Rfl:    ondansetron (ZOFRAN) 4 MG tablet, Take 4 mg by mouth 3 (three) times daily as needed for vomiting or nausea., Disp: , Rfl:    Propylene Glycol (SYSTANE COMPLETE OP), Place 1 drop into both eyes in the morning, at noon, and at bedtime., Disp: , Rfl:    venlafaxine XR (EFFEXOR-XR) 150 MG 24 hr capsule, Take 2 capsules (300 mg total) by mouth every evening. (Patient taking differently: Take 150 mg by mouth in the morning and at bedtime.), Disp: 60 capsule, Rfl: 0   baclofen (LIORESAL) 10 MG tablet, Take 10 mg by mouth daily as needed for muscle spasms (laryngospasm)., Disp: , Rfl:    HYDROcodone-acetaminophen (NORCO/VICODIN) 5-325 MG tablet, Take 1 tablet by mouth every 6 (six) hours as needed., Disp: 20 tablet, Rfl: 0  Review of Symptoms: Complete 10-system review is positive for: none  Physical Exam: BP (!) 148/98 Comment: manual recheck, Dr. Alvester Morin notified (pt to monitor at home and go to ER if elevated w/S&S)  Pulse 98   Temp 98.1 F (36.7 C) (Oral)   Resp 16   Wt 154 lb 6.4 oz (70 kg)   LMP 06/20/1997 (Approximate) Comment: post menopausal  SpO2 97%   BMI 24.92 kg/m  General: Alert, oriented, no acute distress. HEENT: Normocephalic, atraumatic. Neck symmetric without masses. Sclera anicteric.  Chest: Normal work of breathing.  Abdomen: Soft, nontender.  Extremities: Grossly normal range of motion.  Warm, well perfused.  No edema bilaterally. Skin: No rashes or lesions noted. Lymphatics: No inguinal adenopathy. GU: External genitalia with evidence of posterior vulvectomy of the perineal body.  Area has now completely healed over with thin tissue at the area of prior separation.  No ongoing separation.  No erythema, induration, or drainage. Speculum exam reveals vagina and cervix without lesion.  Bimanual exam reveals normal cervix and uterus.     Exam chaperoned by Kimberly [place], CMA  Laboratory & Radiologic Studies: none

## 2022-11-22 ENCOUNTER — Telehealth: Payer: Self-pay | Admitting: *Deleted

## 2022-11-22 ENCOUNTER — Ambulatory Visit (INDEPENDENT_AMBULATORY_CARE_PROVIDER_SITE_OTHER): Payer: 59 | Admitting: Orthopedic Surgery

## 2022-11-22 ENCOUNTER — Ambulatory Visit: Payer: 59 | Admitting: Neurology

## 2022-11-22 DIAGNOSIS — T8484XA Pain due to internal orthopedic prosthetic devices, implants and grafts, initial encounter: Secondary | ICD-10-CM

## 2022-11-22 DIAGNOSIS — S42331D Displaced oblique fracture of shaft of humerus, right arm, subsequent encounter for fracture with routine healing: Secondary | ICD-10-CM | POA: Diagnosis not present

## 2022-11-22 MED ORDER — HYDROCODONE-ACETAMINOPHEN 5-325 MG PO TABS: 1.0000 | ORAL_TABLET | Freq: Four times a day (QID) | ORAL | 0 refills | Status: DC | PRN

## 2022-11-22 NOTE — Telephone Encounter (Signed)
Attempted to reach Ms. Overholser in regards to her pharmacy [facility]) who called the office this morning stating they are unable to fill the lidocaine 2% jelly. Left voicemail requesting call back to [phone].

## 2022-11-23 ENCOUNTER — Encounter: Payer: Self-pay | Admitting: Orthopedic Surgery

## 2022-11-23 NOTE — Progress Notes (Signed)
Orthopaedic Postop Note  Assessment: Laura Mcgee is a 72 y.o. female s/p IM nail of right humerus shaft fracture  DOS: 06/25/2021  Plan: [name] wants to proceed with removal of hardware from the right humerus.  She has obtained medical clearance from her cardiologist.  She is awaiting clearance from her PCP.  Unfortunately, she has been waiting for an appointment with a new provider.  This is scheduled for September 24.  At this time, we anticipate that she will get medical clearance for surgery.  As such, I would like to have her scheduled for September 30, to proceed with removal of hardware from the right humerus.  If she does not obtain clearance at her visit next week, we will postpone her surgery.  She states her understanding.    Risks and benefits of the surgery, including, but not limited to infection, bleeding, persistent pain, need for further surgery, non-union, malunion, shoulder pain and more severe complications associated with anesthesia were discussed with the patient.  The patient has elected to proceed.  Follow-up: After surgery; DOS 10/31/2022  XR at next visit: TBD  Subjective:  Chief Complaint  Patient presents with   Shoulder Pain    R shoulder discuss surgery     History of Present Illness: Laura Mcgee is a 72 y.o. female who returns following the above stated procedure.  We have been trying to get medical clearance to proceed with removal of hardware.  She had surgery in April, and was cleared at that time.  Unfortunately, her PCP has retired.  As such, she is establishing care with a new provider, and is scheduled for an office visit next week.  She would like to proceed with surgery soon as possible.  She continues to have right shoulder pain.   review of Systems: No fevers or chills No numbness or tingling No Chest Pain No shortness of breath   Objective: LMP 06/20/1997 (Approximate) Comment: post menopausal  Physical Exam:  Alert and  oriented.  No acute distress.  Surgical incisions are healed.  No surrounding erythema or drainage.  Mild deformity in the anterior upper arm.  She tolerates 80 degrees of abduction at her side.  110 degrees of forward flexion.  Limited external rotation.  She has occasional sharp pains.  Fingers are warm and well-perfused.  Sensation intact throughout the right hand.  Sensation intact in the axillary nerve distribution.  IMAGING: I personally ordered and reviewed the following images:  No new imaging obtained today.  Oliver Barre, MD 11/23/2022 8:29 AM

## 2022-11-23 NOTE — Telephone Encounter (Signed)
2nd attempt to reach patient about 2 % lidocaine jelly Rx. Left voicemail requesting call back.

## 2022-11-23 NOTE — Telephone Encounter (Signed)
Patient has been scheduled for surgery on 12/05/22 pending GP clearance.

## 2022-11-23 NOTE — H&P (View-Only) (Signed)
Orthopaedic Postop Note  Assessment: Laura Mcgee is a 72 y.o. female s/p IM nail of right humerus shaft fracture  DOS: 06/25/2021  Plan: [name] wants to proceed with removal of hardware from the right humerus.  She has obtained medical clearance from her cardiologist.  She is awaiting clearance from her PCP.  Unfortunately, she has been waiting for an appointment with a new provider.  This is scheduled for September 24.  At this time, we anticipate that she will get medical clearance for surgery.  As such, I would like to have her scheduled for September 30, to proceed with removal of hardware from the right humerus.  If she does not obtain clearance at her visit next week, we will postpone her surgery.  She states her understanding.    Risks and benefits of the surgery, including, but not limited to infection, bleeding, persistent pain, need for further surgery, non-union, malunion, shoulder pain and more severe complications associated with anesthesia were discussed with the patient.  The patient has elected to proceed.  Follow-up: After surgery; DOS 10/31/2022  XR at next visit: TBD  Subjective:  Chief Complaint  Patient presents with   Shoulder Pain    R shoulder discuss surgery     History of Present Illness: Laura Mcgee is a 72 y.o. female who returns following the above stated procedure.  We have been trying to get medical clearance to proceed with removal of hardware.  She had surgery in April, and was cleared at that time.  Unfortunately, her PCP has retired.  As such, she is establishing care with a new provider, and is scheduled for an office visit next week.  She would like to proceed with surgery soon as possible.  She continues to have right shoulder pain.   review of Systems: No fevers or chills No numbness or tingling No Chest Pain No shortness of breath   Objective: LMP 06/20/1997 (Approximate) Comment: post menopausal  Physical Exam:  Alert and  oriented.  No acute distress.  Surgical incisions are healed.  No surrounding erythema or drainage.  Mild deformity in the anterior upper arm.  She tolerates 80 degrees of abduction at her side.  110 degrees of forward flexion.  Limited external rotation.  She has occasional sharp pains.  Fingers are warm and well-perfused.  Sensation intact throughout the right hand.  Sensation intact in the axillary nerve distribution.  IMAGING: I personally ordered and reviewed the following images:  No new imaging obtained today.  Oliver Barre, MD 11/23/2022 8:29 AM

## 2022-11-24 ENCOUNTER — Encounter: Payer: Self-pay | Admitting: Psychiatry

## 2022-11-24 NOTE — Telephone Encounter (Signed)
3rd attempt to reach patient. Left voicemail requesting call back.

## 2022-11-30 NOTE — Patient Instructions (Signed)
Your procedure is scheduled on: 12/05/2022  Report to [facility] Main Entrance at   7:00  AM.  Call this number if you have problems the morning of surgery: [phone]   Remember:   Do not Eat after midnight, you may have CLEAR liquids until 4:30 am  Drink Carb loading drink at 4:15 AM         No Smoking the morning of surgery  :  Take these medicines the morning of surgery with A SIP OF WATER: Amlodipine and Effexor  May take hydrocodone or zofran if needed   Do not wear jewelry, make-up or nail polish.  Do not wear lotions, powders, or perfumes. You may wear deodorant.  Do not shave 48 hours prior to surgery. Men may shave face and neck.  Do not bring valuables to the hospital.  Contacts, dentures or bridgework may not be worn into surgery.  Leave suitcase in the car. After surgery it may be brought to your room.  For patients admitted to the hospital, checkout time is 11:00 AM the day of discharge.   Patients discharged the day of surgery will not be allowed to drive home.    Special Instructions: Shower using CHG night before surgery and shower the day of surgery use CHG.  Use special wash - you have one bottle of CHG for all showers.  You should use approximately 1/2 of the bottle for each shower. How to Use Chlorhexidine Before Surgery Chlorhexidine gluconate (CHG) is a germ-killing (antiseptic) solution that is used to clean the skin. It can get rid of the bacteria that normally live on the skin and can keep them away for about 24 hours. To clean your skin with CHG, you may be given: A CHG solution to use in the shower or as part of a sponge bath. A prepackaged cloth that contains CHG. Cleaning your skin with CHG may help lower the risk for infection: While you are staying in the intensive care unit of the hospital. If you have a vascular access, such as a central line, to provide short-term or long-term access to your veins. If you have a catheter to drain urine from  your bladder. If you are on a ventilator. A ventilator is a machine that helps you breathe by moving air in and out of your lungs. After surgery. What are the risks? Risks of using CHG include: A skin reaction. Hearing loss, if CHG gets in your ears and you have a perforated eardrum. Eye injury, if CHG gets in your eyes and is not rinsed out. The CHG product catching fire. Make sure that you avoid smoking and flames after applying CHG to your skin. Do not use CHG: If you have a chlorhexidine allergy or have previously reacted to chlorhexidine. On babies younger than 47 months of age. How to use CHG solution Use CHG only as told by your health care provider, and follow the instructions on the label. Use the full amount of CHG as directed. Usually, this is one bottle. During a shower Follow these steps when using CHG solution during a shower (unless your health care provider gives you different instructions): Start the shower. Use your normal soap and shampoo to wash your face and hair. Turn off the shower or move out of the shower stream. Pour the CHG onto a clean washcloth. Do not use any type of brush or rough-edged sponge. Starting at your neck, lather your body down to your toes. Make sure you follow these  instructions: If you will be having surgery, pay special attention to the part of your body where you will be having surgery. Scrub this area for at least 1 minute. Do not use CHG on your head or face. If the solution gets into your ears or eyes, rinse them well with water. Avoid your genital area. Avoid any areas of skin that have broken skin, cuts, or scrapes. Scrub your back and under your arms. Make sure to wash skin folds. Let the lather sit on your skin for 1-2 minutes or as long as told by your health care provider. Thoroughly rinse your entire body in the shower. Make sure that all body creases and crevices are rinsed well. Dry off with a clean towel. Do not put any  substances on your body afterward--such as powder, lotion, or perfume--unless you are told to do so by your health care provider. Only use lotions that are recommended by the manufacturer. Put on clean clothes or pajamas. If it is the night before your surgery, sleep in clean sheets.  During a sponge bath Follow these steps when using CHG solution during a sponge bath (unless your health care provider gives you different instructions): Use your normal soap and shampoo to wash your face and hair. Pour the CHG onto a clean washcloth. Starting at your neck, lather your body down to your toes. Make sure you follow these instructions: If you will be having surgery, pay special attention to the part of your body where you will be having surgery. Scrub this area for at least 1 minute. Do not use CHG on your head or face. If the solution gets into your ears or eyes, rinse them well with water. Avoid your genital area. Avoid any areas of skin that have broken skin, cuts, or scrapes. Scrub your back and under your arms. Make sure to wash skin folds. Let the lather sit on your skin for 1-2 minutes or as long as told by your health care provider. Using a different clean, wet washcloth, thoroughly rinse your entire body. Make sure that all body creases and crevices are rinsed well. Dry off with a clean towel. Do not put any substances on your body afterward--such as powder, lotion, or perfume--unless you are told to do so by your health care provider. Only use lotions that are recommended by the manufacturer. Put on clean clothes or pajamas. If it is the night before your surgery, sleep in clean sheets. How to use CHG prepackaged cloths Only use CHG cloths as told by your health care provider, and follow the instructions on the label. Use the CHG cloth on clean, dry skin. Do not use the CHG cloth on your head or face unless your health care provider tells you to. When washing with the CHG cloth: Avoid  your genital area. Avoid any areas of skin that have broken skin, cuts, or scrapes. Before surgery Follow these steps when using a CHG cloth to clean before surgery (unless your health care provider gives you different instructions): Using the CHG cloth, vigorously scrub the part of your body where you will be having surgery. Scrub using a back-and-forth motion for 3 minutes. The area on your body should be completely wet with CHG when you are done scrubbing. Do not rinse. Discard the cloth and let the area air-dry. Do not put any substances on the area afterward, such as powder, lotion, or perfume. Put on clean clothes or pajamas. If it is the night before your surgery,  sleep in clean sheets.  For general bathing Follow these steps when using CHG cloths for general bathing (unless your health care provider gives you different instructions). Use a separate CHG cloth for each area of your body. Make sure you wash between any folds of skin and between your fingers and toes. Wash your body in the following order, switching to a new cloth after each step: The front of your neck, shoulders, and chest. Both of your arms, under your arms, and your hands. Your stomach and groin area, avoiding the genitals. Your right leg and foot. Your left leg and foot. The back of your neck, your back, and your buttocks. Do not rinse. Discard the cloth and let the area air-dry. Do not put any substances on your body afterward--such as powder, lotion, or perfume--unless you are told to do so by your health care provider. Only use lotions that are recommended by the manufacturer. Put on clean clothes or pajamas. Contact a health care provider if: Your skin gets irritated after scrubbing. You have questions about using your solution or cloth. You swallow any chlorhexidine. Call your local poison control center [phone] in the [place] Get help right away if: Your eyes itch badly, or they become very red or  swollen. Your skin itches badly and is red or swollen. Your hearing changes. You have trouble seeing. You have swelling or tingling in your mouth or throat. You have trouble breathing. These symptoms may represent a serious problem that is an emergency. Do not wait to see if the symptoms will go away. Get medical help right away. Call your local emergency services (911 in the [place] Do not drive yourself to the hospital. Summary Chlorhexidine gluconate (CHG) is a germ-killing (antiseptic) solution that is used to clean the skin. Cleaning your skin with CHG may help to lower your risk for infection. You may be given CHG to use for bathing. It may be in a bottle or in a prepackaged cloth to use on your skin. Carefully follow your health care provider's instructions and the instructions on the product label. Do not use CHG if you have a chlorhexidine allergy. Contact your health care provider if your skin gets irritated after scrubbing. This information is not intended to replace advice given to you by your health care provider. Make sure you discuss any questions you have with your health care provider. Document Revised: 06/21/2021 Document Reviewed: 05/04/2020 Elsevier Patient Education  2023 [employer]  Humerus Fracture removal of Hardware, Care After The following information offers guidance on how to care for yourself after your procedure. Your health care provider may also give you more specific instructions. If you have problems or questions, contact your health care provider. What can I expect after the procedure? After the procedure, it is common to have: Pain. Swelling. Stiffness. A small amount of fluid from the incision. Follow these instructions at home: Medicines Take over-the-counter and prescription medicines only as told by your health care provider. Ask your health care provider if the medicine prescribed to you: Requires you to avoid driving or using machinery. Can  cause constipation. You may need to take these actions to prevent or treat constipation: Drink enough fluid to keep your urine pale yellow. Take over-the-counter or prescription medicines. Eat foods that are high in fiber, such as beans, whole grains, and fresh fruits and vegetables. Limit foods that are high in fat and processed sugars, such as fried or sweet foods. If you have a sling or other  removable immobilizer: Wear the sling or immobilizer as told by your health care provider. Remove it only as told by your health care provider. Check the skin around the sling or immobilizer every day. Tell your health care provider about any concerns. Loosen the sling or immobilizer if your fingers tingle, become numb, or turn cold and blue. Keep the sling or immobilizer clean and dry. Bathing Do not take baths, swim, or use a hot tub until your health care provider approves. Ask your health care provider if you may take showers. You may only be allowed to take sponge baths. If the sling or immobilizer is not waterproof: Do not let it get wet. Remove it when you take a bath or a shower, but leave your arm at your side. Incision care  Follow instructions from your health care provider about how to take care of your incision. Make sure you: Wash your hands with soap and water for at least 20 seconds before and after you change your bandage (dressing). If soap and water are not available, use hand sanitizer. Change your dressing as told by your health care provider. Leave stitches (sutures), staples, skin glue, or adhesive strips in place. These skin closures may need to stay in place for 2 weeks or longer. If adhesive strip edges start to loosen and curl up, you may trim the loose edges. Do not remove adhesive strips completely unless your health care provider tells you to do that. Keep the dressing dry until your health care provider says it can be removed. Check your incision area every day for signs  of infection. Check for: Redness. More swelling or pain. Blood or more fluid. Warmth. Pus or a bad smell. Managing pain, stiffness, and swelling  If directed, put ice on the affected area. To do this: If you have a sling or other removable immobilizer, remove it as told by your health care provider. Put ice in a plastic bag. Place a towel between your skin and the bag. Leave the ice on for 20 minutes, 2-3 times a day. Remove the ice if your skin turns bright red. This is very important. If you cannot feel pain, heat, or cold, you have a greater risk of damage to the area. Move your fingers often to reduce stiffness and swelling. Raise (elevate) the injured area above the level of your heart while you are sitting or lying down. To do this, try putting a few pillows under your arm. Activity Ask your health care provider when it is safe to drive if you have a sling or immobilizer on your shoulder and arm. Return to your normal activities as told by your health care provider. Ask your health care provider what activities are safe for you. Do exercises as told by your health care provider. Do not lift with your injured arm until your health care provider approves. Avoid pulling and pushing. Follow instructions from your health care provider about: Whether you may gently use your hand and elbow immediately after surgery. When to start doing gentle range-of-motion movements with your shoulder and elbow. It is important to do these movements to prevent loss of motion. General instructions Do not use any products that contain nicotine or tobacco. These products include cigarettes, chewing tobacco, and vaping devices, such as e-cigarettes. These can delay bone and incision healing. If you need help quitting, ask your health care provider. Keep all follow-up visits. This is important. Contact a health care provider if: You have pain that is not  helped by medicine. You have a fever. You have any  signs of infection, such as: Redness, more swelling, or more pain around your incision. Blood or more fluid coming from your incision. Warmth coming from your incision. Pus or a bad smell coming from your incision. You have numbness or tingling in your hand or fingers. Get help right away if: You have severe pain. The edges of your incision come apart after the stitches or staples have been taken out. You have trouble breathing. These symptoms may represent a serious problem that is an emergency. Do not wait to see if the symptoms will go away. Get medical help right away. Call your local emergency services (911 in the [place] Do not drive yourself to the hospital. Summary After this procedure, it is common to have some pain, swelling, or stiffness. If your sling or immobilizer is not waterproof, do not let it get wet. Contact your health care provider if you have blood or more fluid coming from your incision. Get help right away if the edges of your incision come apart after the stitches or staples have been taken out. This information is not intended to replace advice given to you by your health care provider. Make sure you discuss any questions you have with your health care provider. Document Revised: 06/17/2020 Document Reviewed: 06/17/2020 Elsevier Patient Education  2024 [employer] General Anesthesia, Adult, Care After The following information offers guidance on how to care for yourself after your procedure. Your health care provider may also give you more specific instructions. If you have problems or questions, contact your health care provider. What can I expect after the procedure? After the procedure, it is common for people to: Have pain or discomfort at the IV site. Have nausea or vomiting. Have a sore throat or hoarseness. Have trouble concentrating. Feel cold or chills. Feel weak, sleepy, or tired (fatigue). Have soreness and body aches. These can affect parts of the  body that were not involved in surgery. Follow these instructions at home: For the time period you were told by your health care provider:  Rest. Do not participate in activities where you could fall or become injured. Do not drive or use machinery. Do not drink alcohol. Do not take sleeping pills or medicines that cause drowsiness. Do not make important decisions or sign legal documents. Do not take care of children on your own. General instructions Drink enough fluid to keep your urine pale yellow. If you have sleep apnea, surgery and certain medicines can increase your risk for breathing problems. Follow instructions from your health care provider about wearing your sleep device: Anytime you are sleeping, including during daytime naps. While taking prescription pain medicines, sleeping medicines, or medicines that make you drowsy. Return to your normal activities as told by your health care provider. Ask your health care provider what activities are safe for you. Take over-the-counter and prescription medicines only as told by your health care provider. Do not use any products that contain nicotine or tobacco. These products include cigarettes, chewing tobacco, and vaping devices, such as e-cigarettes. These can delay incision healing after surgery. If you need help quitting, ask your health care provider. Contact a health care provider if: You have nausea or vomiting that does not get better with medicine. You vomit every time you eat or drink. You have pain that does not get better with medicine. You cannot urinate or have bloody urine. You develop a skin rash. You have a fever. Get  help right away if: You have trouble breathing. You have chest pain. You vomit blood. These symptoms may be an emergency. Get help right away. Call 911. Do not wait to see if the symptoms will go away. Do not drive yourself to the hospital. Summary After the procedure, it is common to have a sore  throat, hoarseness, nausea, vomiting, or to feel weak, sleepy, or fatigue. For the time period you were told by your health care provider, do not drive or use machinery. Get help right away if you have difficulty breathing, have chest pain, or vomit blood. These symptoms may be an emergency. This information is not intended to replace advice given to you by your health care provider. Make sure you discuss any questions you have with your health care provider. Document Revised: 05/21/2021 Document Reviewed: 05/21/2021 Elsevier Patient Education  2024 [employer].

## 2022-12-01 ENCOUNTER — Encounter (HOSPITAL_COMMUNITY)
Admission: RE | Admit: 2022-12-01 | Discharge: 2022-12-01 | Disposition: A | Discharge status: Home / Self Care | Payer: 59 | Source: Ambulatory Visit | Attending: Orthopedic Surgery | Admitting: Orthopedic Surgery

## 2022-12-01 ENCOUNTER — Encounter (HOSPITAL_COMMUNITY): Payer: Self-pay

## 2022-12-01 DIAGNOSIS — Z01818 Encounter for other preprocedural examination: Secondary | ICD-10-CM

## 2022-12-01 DIAGNOSIS — Z01812 Encounter for preprocedural laboratory examination: Secondary | ICD-10-CM | POA: Insufficient documentation

## 2022-12-01 LAB — CBC
HCT: 52 % — ABNORMAL HIGH (ref 36.0–46.0)
Hemoglobin: 17.5 g/dL — ABNORMAL HIGH (ref 12.0–15.0)
MCH: 33 pg (ref 26.0–34.0)
MCHC: 33.7 g/dL (ref 30.0–36.0)
MCV: 97.9 fL (ref 80.0–100.0)
Platelets: 251 10*3/uL (ref 150–400)
RBC: 5.31 MIL/uL — ABNORMAL HIGH (ref 3.87–5.11)
RDW: 15 % (ref 11.5–15.5)
WBC: 8.2 10*3/uL (ref 4.0–10.5)
nRBC: 0 % (ref 0.0–0.2)

## 2022-12-01 LAB — BASIC METABOLIC PANEL
Anion gap: 13 (ref 5–15)
BUN: 11 mg/dL (ref 8–23)
CO2: 20 mmol/L — ABNORMAL LOW (ref 22–32)
Calcium: 9.5 mg/dL (ref 8.9–10.3)
Chloride: 103 mmol/L (ref 98–111)
Creatinine, Ser: 0.72 mg/dL (ref 0.44–1.00)
GFR, Estimated: >60 mL/min (ref >60)
Glucose, Bld: 86 mg/dL (ref 70–99)
Potassium: 3.8 mmol/L (ref 3.5–5.1)
Sodium: 136 mmol/L (ref 135–145)

## 2022-12-01 NOTE — Progress Notes (Signed)
Pt scheduled for PAT today @ 1345. Has not shown up and unable to reach by phone.Will attempt to contact again later this afternoon.

## 2022-12-05 ENCOUNTER — Encounter (HOSPITAL_COMMUNITY): Payer: Self-pay | Admitting: Orthopedic Surgery

## 2022-12-05 ENCOUNTER — Other Ambulatory Visit: Payer: Self-pay

## 2022-12-05 ENCOUNTER — Ambulatory Visit (HOSPITAL_COMMUNITY)
Admission: RE | Admit: 2022-12-05 | Discharge: 2022-12-05 | Disposition: A | Discharge status: Home / Self Care | Payer: 59 | Attending: Orthopedic Surgery | Admitting: Orthopedic Surgery

## 2022-12-05 ENCOUNTER — Ambulatory Visit (HOSPITAL_BASED_OUTPATIENT_CLINIC_OR_DEPARTMENT_OTHER): Payer: 59 | Admitting: Anesthesiology

## 2022-12-05 ENCOUNTER — Ambulatory Visit (HOSPITAL_COMMUNITY): Payer: 59 | Admitting: Anesthesiology

## 2022-12-05 ENCOUNTER — Ambulatory Visit (HOSPITAL_COMMUNITY): Payer: 59

## 2022-12-05 ENCOUNTER — Encounter (HOSPITAL_COMMUNITY)
Admission: RE | Disposition: A | Discharge status: Home / Self Care | Payer: Self-pay | Source: Home / Self Care | Attending: Orthopedic Surgery

## 2022-12-05 DIAGNOSIS — F32A Depression, unspecified: Secondary | ICD-10-CM | POA: Diagnosis not present

## 2022-12-05 DIAGNOSIS — J449 Chronic obstructive pulmonary disease, unspecified: Secondary | ICD-10-CM

## 2022-12-05 DIAGNOSIS — F17210 Nicotine dependence, cigarettes, uncomplicated: Secondary | ICD-10-CM

## 2022-12-05 DIAGNOSIS — I10 Essential (primary) hypertension: Secondary | ICD-10-CM | POA: Insufficient documentation

## 2022-12-05 DIAGNOSIS — T8484XA Pain due to internal orthopedic prosthetic devices, implants and grafts, initial encounter: Secondary | ICD-10-CM

## 2022-12-05 DIAGNOSIS — X58XXXA Exposure to other specified factors, initial encounter: Secondary | ICD-10-CM | POA: Diagnosis not present

## 2022-12-05 DIAGNOSIS — F17200 Nicotine dependence, unspecified, uncomplicated: Secondary | ICD-10-CM | POA: Diagnosis not present

## 2022-12-05 DIAGNOSIS — S42331D Displaced oblique fracture of shaft of humerus, right arm, subsequent encounter for fracture with routine healing: Secondary | ICD-10-CM

## 2022-12-05 DIAGNOSIS — I4891 Unspecified atrial fibrillation: Secondary | ICD-10-CM | POA: Insufficient documentation

## 2022-12-05 DIAGNOSIS — Z01818 Encounter for other preprocedural examination: Secondary | ICD-10-CM

## 2022-12-05 DIAGNOSIS — K219 Gastro-esophageal reflux disease without esophagitis: Secondary | ICD-10-CM | POA: Insufficient documentation

## 2022-12-05 HISTORY — PX: HARDWARE REMOVAL: SHX979

## 2022-12-05 SURGERY — REMOVAL, HARDWARE
Anesthesia: General | Site: Arm Upper | Laterality: Right

## 2022-12-05 MED ORDER — VANCOMYCIN HCL 1000 MG IV SOLR
INTRAVENOUS | Status: DC | PRN
Start: 2022-12-05 — End: 2022-12-05
  Administered 2022-12-05: 1000 mg

## 2022-12-05 MED ORDER — PHENYLEPHRINE HCL (PRESSORS) 10 MG/ML IV SOLN
INTRAVENOUS | Status: AC
  Filled 2022-12-05: qty 1

## 2022-12-05 MED ORDER — FENTANYL CITRATE PF 50 MCG/ML IJ SOSY
25.0000 ug | PREFILLED_SYRINGE | INTRAMUSCULAR | Status: DC | PRN
  Administered 2022-12-05: 50 ug via INTRAVENOUS
  Filled 2022-12-05: qty 1

## 2022-12-05 MED ORDER — BUPIVACAINE-EPINEPHRINE (PF) 0.5% -1:200000 IJ SOLN
INTRAMUSCULAR | Status: AC
  Filled 2022-12-05: qty 30

## 2022-12-05 MED ORDER — DEXAMETHASONE SODIUM PHOSPHATE 10 MG/ML IJ SOLN
INTRAMUSCULAR | Status: DC | PRN
  Administered 2022-12-05: 10 mg via INTRAVENOUS

## 2022-12-05 MED ORDER — SUCCINYLCHOLINE CHLORIDE 200 MG/10ML IV SOSY
PREFILLED_SYRINGE | INTRAVENOUS | Status: DC | PRN
  Administered 2022-12-05: 100 mg via INTRAVENOUS

## 2022-12-05 MED ORDER — CEFAZOLIN SODIUM-DEXTROSE 2-4 GM/100ML-% IV SOLN
INTRAVENOUS | Status: AC
  Filled 2022-12-05: qty 100

## 2022-12-05 MED ORDER — LACTATED RINGERS IV SOLN: INTRAVENOUS | Status: DC

## 2022-12-05 MED ORDER — ROCURONIUM BROMIDE 10 MG/ML (PF) SYRINGE
PREFILLED_SYRINGE | INTRAVENOUS | Status: DC | PRN
  Administered 2022-12-05 (×2): 20 mg via INTRAVENOUS
  Administered 2022-12-05: 50 mg via INTRAVENOUS

## 2022-12-05 MED ORDER — VANCOMYCIN HCL 1000 MG IV SOLR
INTRAVENOUS | Status: AC
  Filled 2022-12-05: qty 20

## 2022-12-05 MED ORDER — CHLORHEXIDINE GLUCONATE 0.12 % MT SOLN: 15.0000 mL | Freq: Once | OROMUCOSAL | Status: DC

## 2022-12-05 MED ORDER — SUGAMMADEX SODIUM 200 MG/2ML IV SOLN
INTRAVENOUS | Status: DC | PRN
  Administered 2022-12-05: 200 mg via INTRAVENOUS

## 2022-12-05 MED ORDER — MIDAZOLAM HCL 2 MG/2ML IJ SOLN
INTRAMUSCULAR | Status: AC
  Filled 2022-12-05: qty 2

## 2022-12-05 MED ORDER — PHENYLEPHRINE 80 MCG/ML (10ML) SYRINGE FOR IV PUSH (FOR BLOOD PRESSURE SUPPORT)
PREFILLED_SYRINGE | INTRAVENOUS | Status: AC
  Filled 2022-12-05: qty 10

## 2022-12-05 MED ORDER — PROPOFOL 10 MG/ML IV BOLUS
INTRAVENOUS | Status: AC
  Filled 2022-12-05: qty 20

## 2022-12-05 MED ORDER — ACETAMINOPHEN 500 MG PO TABS: 1000.0000 mg | ORAL_TABLET | Freq: Three times a day (TID) | ORAL | 0 refills | Status: AC

## 2022-12-05 MED ORDER — DEXAMETHASONE SODIUM PHOSPHATE 10 MG/ML IJ SOLN
INTRAMUSCULAR | Status: AC
  Filled 2022-12-05: qty 1

## 2022-12-05 MED ORDER — LIDOCAINE HCL (PF) 2 % IJ SOLN
INTRAMUSCULAR | Status: AC
  Filled 2022-12-05: qty 5

## 2022-12-05 MED ORDER — LACTATED RINGERS IV SOLN: INTRAVENOUS | Status: DC | PRN

## 2022-12-05 MED ORDER — PHENYLEPHRINE 80 MCG/ML (10ML) SYRINGE FOR IV PUSH (FOR BLOOD PRESSURE SUPPORT)
PREFILLED_SYRINGE | INTRAVENOUS | Status: DC | PRN
  Administered 2022-12-05 (×5): 160 ug via INTRAVENOUS
  Administered 2022-12-05: 80 ug via INTRAVENOUS

## 2022-12-05 MED ORDER — ORAL CARE MOUTH RINSE: 15.0000 mL | Freq: Once | OROMUCOSAL | Status: DC

## 2022-12-05 MED ORDER — GLYCOPYRROLATE PF 0.2 MG/ML IJ SOSY
PREFILLED_SYRINGE | INTRAMUSCULAR | Status: DC | PRN
  Administered 2022-12-05: .1 mg via INTRAVENOUS

## 2022-12-05 MED ORDER — CHLORHEXIDINE GLUCONATE 0.12 % MT SOLN
OROMUCOSAL | Status: AC
  Filled 2022-12-05: qty 15

## 2022-12-05 MED ORDER — FENTANYL CITRATE (PF) 250 MCG/5ML IJ SOLN
INTRAMUSCULAR | Status: DC | PRN
  Administered 2022-12-05 (×2): 100 ug via INTRAVENOUS
  Administered 2022-12-05: 50 ug via INTRAVENOUS

## 2022-12-05 MED ORDER — MIDAZOLAM HCL 2 MG/2ML IJ SOLN
INTRAMUSCULAR | Status: DC | PRN
  Administered 2022-12-05: 2 mg via INTRAVENOUS

## 2022-12-05 MED ORDER — PROPOFOL 10 MG/ML IV BOLUS
INTRAVENOUS | Status: DC | PRN
  Administered 2022-12-05: 120 mg via INTRAVENOUS

## 2022-12-05 MED ORDER — ONDANSETRON HCL 4 MG/2ML IJ SOLN
INTRAMUSCULAR | Status: DC | PRN
  Administered 2022-12-05: 4 mg via INTRAVENOUS

## 2022-12-05 MED ORDER — BUPIVACAINE-EPINEPHRINE 0.5% -1:200000 IJ SOLN
INTRAMUSCULAR | Status: DC | PRN
Start: 2022-12-05 — End: 2022-12-05
  Administered 2022-12-05: 30 mL

## 2022-12-05 MED ORDER — LIDOCAINE 2% (20 MG/ML) 5 ML SYRINGE
INTRAMUSCULAR | Status: DC | PRN
  Administered 2022-12-05: 50 mg via INTRAVENOUS

## 2022-12-05 MED ORDER — TRANEXAMIC ACID-NACL 1000-0.7 MG/100ML-% IV SOLN
1000.0000 mg | INTRAVENOUS | Status: AC
  Administered 2022-12-05: 1000 mg via INTRAVENOUS
  Filled 2022-12-05: qty 100

## 2022-12-05 MED ORDER — OXYCODONE HCL 5 MG PO TABS
5.0000 mg | ORAL_TABLET | Freq: Once | ORAL | Status: AC | PRN
  Administered 2022-12-05: 5 mg via ORAL
  Filled 2022-12-05: qty 1

## 2022-12-05 MED ORDER — ROCURONIUM BROMIDE 10 MG/ML (PF) SYRINGE
PREFILLED_SYRINGE | INTRAVENOUS | Status: AC
  Filled 2022-12-05: qty 10

## 2022-12-05 MED ORDER — ONDANSETRON HCL 4 MG/2ML IJ SOLN: 4.0000 mg | Freq: Once | INTRAMUSCULAR | Status: DC | PRN

## 2022-12-05 MED ORDER — CELECOXIB 100 MG PO CAPS: 100.0000 mg | ORAL_CAPSULE | Freq: Every day | ORAL | 0 refills | Status: AC

## 2022-12-05 MED ORDER — ONDANSETRON HCL 4 MG PO TABS: 4.0000 mg | ORAL_TABLET | Freq: Three times a day (TID) | ORAL | 0 refills | Status: AC | PRN

## 2022-12-05 MED ORDER — ONDANSETRON HCL 4 MG/2ML IJ SOLN
INTRAMUSCULAR | Status: AC
  Filled 2022-12-05: qty 2

## 2022-12-05 MED ORDER — GLYCOPYRROLATE PF 0.2 MG/ML IJ SOSY
PREFILLED_SYRINGE | INTRAMUSCULAR | Status: AC
  Filled 2022-12-05: qty 1

## 2022-12-05 MED ORDER — PHENYLEPHRINE HCL-NACL 20-0.9 MG/250ML-% IV SOLN
INTRAVENOUS | Status: DC | PRN
  Administered 2022-12-05: 60 ug/min via INTRAVENOUS

## 2022-12-05 MED ORDER — FENTANYL CITRATE (PF) 250 MCG/5ML IJ SOLN
INTRAMUSCULAR | Status: AC
  Filled 2022-12-05: qty 5

## 2022-12-05 MED ORDER — OXYCODONE HCL 5 MG/5ML PO SOLN: 5.0000 mg | Freq: Once | ORAL | Status: AC | PRN

## 2022-12-05 MED ORDER — OXYCODONE HCL 5 MG PO TABS
5.0000 mg | ORAL_TABLET | ORAL | 0 refills | Status: AC | PRN
Start: 2022-12-05 — End: 2022-12-12

## 2022-12-05 MED ORDER — 0.9 % SODIUM CHLORIDE (POUR BTL) OPTIME
TOPICAL | Status: DC | PRN
  Administered 2022-12-05: 1000 mL

## 2022-12-05 MED ORDER — CEFAZOLIN SODIUM-DEXTROSE 2-4 GM/100ML-% IV SOLN
2.0000 g | INTRAVENOUS | Status: AC
  Administered 2022-12-05: 2 g via INTRAVENOUS

## 2022-12-05 SURGICAL SUPPLY — 36 items
APL PRP STRL LF DISP 70% ISPRP (MISCELLANEOUS) ×1
APL PRP STRL LF ISPRP CHG 10.5 (MISCELLANEOUS) ×1
APPLICATOR CHLORAPREP 10.5 ORG (MISCELLANEOUS) IMPLANT
BLADE SURG SZ10 CARB STEEL (BLADE) IMPLANT
BNDG CMPR STD VLCR NS LF 5.8X4 (GAUZE/BANDAGES/DRESSINGS) ×1
BNDG COHESIVE 4X5 TAN STRL (GAUZE/BANDAGES/DRESSINGS) ×2 IMPLANT
BNDG ELASTIC 4X5.8 VLCR NS LF (GAUZE/BANDAGES/DRESSINGS) IMPLANT
CHLORAPREP W/TINT 26 (MISCELLANEOUS) IMPLANT
CLOTH BEACON ORANGE TIMEOUT ST (SAFETY) ×1 IMPLANT
COVER LIGHT HANDLE STERIS (MISCELLANEOUS) ×1 IMPLANT
DRAPE C-ARM FOLDED MOBILE STRL (DRAPES) IMPLANT
DRSG TEGADERM 4X4.75 (GAUZE/BANDAGES/DRESSINGS) IMPLANT
ELECT REM PT RETURN 9FT ADLT (ELECTROSURGICAL) ×1
ELECTRODE REM PT RTRN 9FT ADLT (ELECTROSURGICAL) ×1 IMPLANT
GAUZE SPONGE 4X4 12PLY STRL (GAUZE/BANDAGES/DRESSINGS) IMPLANT
GAUZE XEROFORM 1X8 LF (GAUZE/BANDAGES/DRESSINGS) IMPLANT
GLOVE BIOGEL PI IND STRL 7.0 (GLOVE) ×2 IMPLANT
GLOVE BIOGEL PI IND STRL 8 (GLOVE) IMPLANT
GLOVE SKINSENSE STRL SZ8.0 LF (GLOVE) IMPLANT
GOWN STRL REUS W/TWL LRG LVL3 (GOWN DISPOSABLE) ×1 IMPLANT
KIT TURNOVER KIT A (KITS) ×2 IMPLANT
MANIFOLD NEPTUNE II (INSTRUMENTS) ×1 IMPLANT
NDL HYPO 21X1.5 SAFETY (NEEDLE) IMPLANT
NEEDLE HYPO 21X1.5 SAFETY (NEEDLE) ×1
NS IRRIG 1000ML POUR BTL (IV SOLUTION) ×1 IMPLANT
PACK BASIC LIMB (CUSTOM PROCEDURE TRAY) ×1 IMPLANT
PAD ARMBOARD 7.5X6 YLW CONV (MISCELLANEOUS) ×2 IMPLANT
POSITIONER HEAD 8X9X4 ADT (SOFTGOODS) ×1 IMPLANT
PUTTY DBX 5CC (Putty) IMPLANT
SET BASIN LINEN APH (SET/KITS/TRAYS/PACK) ×1 IMPLANT
SLING ARM IMMOBILIZER MED (SOFTGOODS) IMPLANT
SPONGE T-LAP 18X18 ~~LOC~~+RFID (SPONGE) ×2 IMPLANT
SUT ETHILON 3 0 FSL (SUTURE) IMPLANT
SUT VIC AB 2-0 CT2 27 (SUTURE) IMPLANT
SYR 30ML LL (SYRINGE) ×1 IMPLANT
SYR BULB IRRIG 60ML STRL (SYRINGE) ×1 IMPLANT

## 2022-12-05 NOTE — Interval H&P Note (Signed)
History and Physical Interval Note:  12/05/2022 10:49 AM  Laura Mcgee  has presented today for surgery, with the diagnosis of PAINFUL RIGHT SHOULDER HARDWARE.  The various methods of treatment have been discussed with the patient and family. After consideration of risks, benefits and other options for treatment, the patient has consented to  Procedure(s) with comments: RIGHT SHOULDER HARDWARE REMOVAL (Right) - NEEDS RNFA as a surgical intervention.  The patient's history has been reviewed, patient examined, no change in status, stable for surgery.  I have reviewed the patient's chart and labs.  Questions were answered to the patient's satisfaction.    Plan to remove humeral nail and all screws.  All questions have been answered.   Ready to proceed.   Oliver Barre

## 2022-12-05 NOTE — Anesthesia Procedure Notes (Signed)
Procedure Name: Intubation Date/Time: 12/05/2022 11:08 AM  Performed by: Izola Price., CRNAPre-anesthesia Checklist: Patient identified, Emergency Drugs available, Suction available and Patient being monitored Patient Re-evaluated:Patient Re-evaluated prior to induction Oxygen Delivery Method: Circle system utilized Preoxygenation: Pre-oxygenation with 100% oxygen Induction Type: IV induction Ventilation: Mask ventilation without difficulty Laryngoscope Size: Mac and 3 Grade View: Grade I Tube type: Oral Tube size: 7.0 mm Number of attempts: 1 Airway Equipment and Method: Stylet and Oral airway Placement Confirmation: ETT inserted through vocal cords under direct vision, positive ETCO2 and breath sounds checked- equal and bilateral Secured at: 22 cm Tube secured with: Tape Dental Injury: Teeth and Oropharynx as per pre-operative assessment

## 2022-12-05 NOTE — Discharge Instructions (Signed)
Tamira Ryland A. Dallas Schimke, MD MS [facility] [address] [place],  [place]  [address] Phone: [phone] Fax: [phone]    POST-OPERATIVE INSTRUCTIONS - REMOVAL OF HARDWARE RIGHT ARM   WOUND CARE You may leave the operative dressing in place until your follow-up appointment.  If they become soiled, or are falling off, they can be replaced.  Call the clinic if you have concerns.  KEEP THE INCISIONS CLEAN AND DRY. There may be a small amount of fluid/bleeding leaking at the surgical site. This is normal after surgery.  If it fills with liquid or blood please call us to change it for you.   SHOWERING: - You may shower on Post-Op Day #3.   Keep surgical dressings clean and dry.   - You may remove the sling for showering, but keep a water resistant pillow under the arm to keep both the  elbow and shoulder away from the body (mimicking the abduction sling).  - Gently pat the area dry.  - Do not soak the shoulder in water. Do not go swimming in the pool or ocean until your sutures are removed. - KEEP THE INCISIONS CLEAN AND DRY.  EXERCISES Wear the sling at all times except when doing your exercises. You may remove the sling for showering, but keep the arm across the chest or in a secondary sling.    Accidental/Purposeful External Rotation and shoulder flexion (reaching behind you) is to be avoided at all costs for the first month. It is ok to come out of your sling if your are sitting and have assistance for eating.  Do not lift anything heavier than 1 pound until we discuss it further in clinic. Please perform the exercises:   Elbow / Hand / Wrist  Range of Motion Exercises Grip strengthening   REGIONAL ANESTHESIA (NERVE BLOCKS) The anesthesia team may have performed a nerve block for you if safe in the setting of your care.  This is a great tool used to minimize pain.  Typically the block may start wearing off overnight but the long acting medicine may last for  3-4 days.  The nerve block wearing off can be a challenging period but please utilize your as needed pain medications to try and manage this period.    POST-OP MEDICATIONS- Multimodal approach to pain control In general your pain will be controlled with a combination of substances.  Prescriptions unless otherwise discussed are electronically sent to your pharmacy.  This is a carefully made plan we use to minimize narcotic use.     Celebrex - Anti-inflammatory medication taken on a scheduled basis Acetaminophen - Non-narcotic pain medicine taken on a scheduled basis  Oxycodone - This is a strong narcotic, to be used only on an "as needed" basis for pain. Zofran -  take as needed for nausea  Celebrex - these are anti-inflammatory and pain relievers.  Do not take additional ibuprofen, naproxen or other NSAID while taking this medicine.   FOLLOW-UP If you develop a Fever (>101.5), Redness or Drainage from the surgical incision site, please call our office to arrange for an evaluation. Please call the office to schedule a follow-up appointment for a wound check, 7-10 days post-operatively.    HELPFUL INFORMATION  If you had a block, it will wear off between 8-24 hrs postop typically.  This is period when your pain may go from nearly zero to the pain you would have had postop without the block.  This is an abrupt  transition but nothing dangerous is happening.  You may take an extra dose of narcotic when this happens.  You should wean off your narcotic medicines as soon as you are able.  Most patients will be off or using minimal narcotics before their first postop appointment.   Elevating your leg will help with swelling and pain control.  You are encouraged to elevate your leg as much as possible in the first couple of weeks following surgery.  Imagine a drop of water on your toe, and your goal is to get that water back to your heart.  We suggest you use the pain medication the first night prior  to going to bed, in order to ease any pain when the anesthesia wears off. You should avoid taking pain medications on an empty stomach as it will make you nauseous.  Do not drink alcoholic beverages or take illicit drugs when taking pain medications.  In most states it is against the law to drive while you are in a splint or sling.  And certainly against the law to drive while taking narcotics.  You may return to work/school in the next couple of days when you feel up to it.   Pain medication may make you constipated.  Below are a few solutions to try in this order: Decrease the amount of pain medication if you aren't having pain. Drink lots of decaffeinated fluids. Drink prune juice and/or each dried prunes  If the first 3 don't work start with additional solutions Take Colace - an over-the-counter stool softener Take Senokot - an over-the-counter laxative Take Miralax - a stronger over-the-counter laxative

## 2022-12-05 NOTE — Op Note (Signed)
Orthopaedic Surgery Operative Note (CSN: [id number])  Laura Mcgee  February 04, 1951 Date of Surgery: 12/05/2022   Diagnoses:  Status post ORIF of right humeral shaft fracture with humeral nail Painful orthopedic hardware in right humerus  Procedure: Removal of humeral nail and 3 interlocking screws   Operative Finding Successful completion of the planned procedure.  Humerus shaft fracture was confirmed to be healed.  The 3 interlocking screws were then removed, followed by the humeral nail.  The proximal interlocking screws, as well as the humeral nail were overgrown with bone, which required extra dissection.   Post-Op Diagnosis: Same Surgeons:Primary: Oliver Barre, MD Assistants: Westly Pam Location: [facility] OR ROOM 4 Anesthesia: General with local anesthesia Antibiotics: Ancef 2 g with local vancomycin powder 1 g at the surgical site Tourniquet time: N/A Estimated Blood Loss: 250 cc Complications: None Specimens: None  Implants: Implant Name Type Inv. Item Serial No. Manufacturer Lot No. LRB No. Used Action  PUTTY DBX 5CC - [id number] Putty PUTTY DBX 5CC [id number] [employer]KELE[employer] TRANSPLANT FNDN  Right 1 Implanted    Indications for Surgery:   Laura Mcgee is a 72 y.o. female who fell and sustained a right humeral shaft fracture, approximately 18 months ago.  She went on to have an ORIF with a humeral nail.  She healed the fracture well, but developed pain in the right shoulder.  Progressively, she developed some glenohumeral joint arthritis, and the screws subsequently were contributing to the arthritis.  As such, I recommend removal of the hardware, which would help with the sharp pains, and potentially help with her pain overall.  We attempted activity modifications, medications, therapy as well as injections, limited improvement in her symptoms..  Benefits and risks of operative and nonoperative management were discussed prior to surgery with the patient and informed  consent form was completed.  Specific risks including infection, need for additional surgery, bleeding, persistent pain, stiffness, worsening arthritis, fracture and more severe complications associate with anesthesia.  She like to proceed.  Surgical consent was finalized.   Procedure:   The patient was identified properly. Informed consent was obtained and the surgical site was marked. The patient was taken to the OR where general anesthesia was induced.  The patient was positioned supine.  The right arm was prepped and draped in the usual sterile fashion.  Timeout was performed before the beginning of the case.  She received 2 g of Ancef, as well as 1 g of TXA prior to making incision.  Previous proximal humeral incisions were identified.  We started with the long incision, for placement of the nail.  We incised sharply through skin.  We then dissected bluntly through subcutaneous tissue, until we were able to identify the anterolateral raphae of the deltoid.  This was split bluntly, to gain access to the rotator cuff.  We then incised sharply through the rotator cuff to identify the humeral head.  With the assistance of fluoroscopy, we identified the entry site of the humeral nail.  Bone had overgrown the superior portion of the nail.  This was removed with a combination of rongeurs and curette.  The nail was then identified.  We had difficulty seeding the extraction bolt.  However, I was able to secure the nail using a heavy needle driver.  We then turned our attention to the distal interlocking screw.  Under direct visualization, and with the assistance of fluoroscopy, we used the prior incision.  We then dissected bluntly down to the surface of the humerus, and we  were able to identify the distal interlocking screw.  It was subsequently removed without issue, using a screwdriver.  Next, we turned our attention back to the proximal humerus.  There were 2 screws, projecting from lateral to medial.   Through our previous dissection, we were able to identify some growth on the bone.  Fluoroscopy confirmed that the most proximal screw was in this location, with bone overgrowing the screw.  We used a curette to remove the bone, and identified the screw.  Once we had sufficiently remove the bone, to identify the screw, the screwdriver was inserted, and we are able to remove the more proximal interlocking screw.  There were no complications.  There was an egress of joint fluid, upon removal of the screw.  Finally, the proximal humerus was palpated, and with the assistance of fluoroscopy, we identified the trajectory, as well as the location of the final interlocking screw.  Once again, there was bony overgrowth.  We used a curette to gain access to the screw.  We then used rongeurs to remove the remaining bone, in order to be able to seat the screwdriver.  We are able to do this without removing excessive bone.  The screwdriver was then seated, and the screw was removed without issues.    At this point, there was some mobility in the nail.  Using the heavy needle driver, we were able to piston the nail, but unable to extract it.  We placed the heavy needle driver on the proximal extent of the nail, and gently malleted the nail, but we were unable to remove the nail.  We then proceeded to remove some additional bone in the superior portion of the humeral head.  After several attempts, we were able to sufficiently remove the humeral nail in its entirety.  There were no complications.  No fractures were incurred.  We evaluated the humeral canal very closely.  There was some scar tissue around the nail, which was removed.  Final fluoroscopic images demonstrated complete removal of the nail, and all interlocking screws.  We then irrigated the canal copiously.  There were no signs of infection.  Given the extensive removal of the bone in the proximal humerus, as well as the humeral head, it made the decision to place  5 cc of DBX within the canal, and the humeral head to aid in bony healing.  In addition, we placed 1 g of vancomycin powder through the incision, and into the humeral head.  The rotator cuff tendon was identified and the interval created was closed with 2-0 Vicryl.  Next, we closed the skin with 3-0 nylon.  Sterile dressing was placed.  Patient was awoken taken to PACU in stable condition.   Post-operative plan:  The patient will be NWB on the operative extremity Discharge home from the PACU once they have recovered DVT prophylaxis not indicated in this ambulatory upper extremity patient without significant risk factors.    Pain control with PRN pain medication preferring oral medicines.   Follow up plan will be scheduled in approximately 10-14 days for incision check and XR.

## 2022-12-05 NOTE — Anesthesia Preprocedure Evaluation (Addendum)
Anesthesia Evaluation  Patient identified by MRN, date of birth, ID band Patient awake    Reviewed: Allergy & Precautions, H&P , NPO status , Patient's Chart, lab work & pertinent test results, reviewed documented beta blocker date and time   History of Anesthesia Complications (+) DIFFICULT AIRWAY and history of anesthetic complications  Airway Mallampati: II  TM Distance: >3 FB Neck ROM: full    Dental no notable dental hx.    Pulmonary COPD, Current Smoker and Patient abstained from smoking.   Pulmonary exam normal breath sounds clear to auscultation       Cardiovascular Exercise Tolerance: Good hypertension, + dysrhythmias Atrial Fibrillation  Rhythm:regular Rate:Normal     Neuro/Psych  PSYCHIATRIC DISORDERS  Depression     Neuromuscular disease negative neurological ROS  negative psych ROS   GI/Hepatic negative GI ROS, Neg liver ROS,GERD  ,,  Endo/Other  negative endocrine ROS    Renal/GU negative Renal ROS  negative genitourinary   Musculoskeletal   Abdominal   Peds  Hematology negative hematology ROS (+)   Anesthesia Other Findings   Reproductive/Obstetrics negative OB ROS                             Anesthesia Physical Anesthesia Plan  ASA: 3  Anesthesia Plan: General and General LMA   Post-op Pain Management:    Induction:   PONV Risk Score and Plan: Ondansetron  Airway Management Planned: LMA  Additional Equipment:   Intra-op Plan:   Post-operative Plan:   Informed Consent: I have reviewed the patients History and Physical, chart, labs and discussed the procedure including the risks, benefits and alternatives for the proposed anesthesia with the patient or authorized representative who has indicated his/her understanding and acceptance.     Dental Advisory Given  Plan Discussed with: CRNA  Anesthesia Plan Comments:        Anesthesia Quick  Evaluation

## 2022-12-05 NOTE — Anesthesia Postprocedure Evaluation (Signed)
Anesthesia Post Note  Patient: Laura Mcgee  Procedure(s) Performed: RIGHT HUMERUS HARDWARE REMOVAL (Right: Arm Upper)  Patient location during evaluation: PACU Anesthesia Type: General Level of consciousness: awake and alert Pain management: pain level controlled Vital Signs Assessment: post-procedure vital signs reviewed and stable Respiratory status: spontaneous breathing, nonlabored ventilation, respiratory function stable and patient connected to nasal cannula oxygen Cardiovascular status: blood pressure returned to baseline and stable Postop Assessment: no apparent nausea or vomiting Anesthetic complications: no   There were no known notable events for this encounter.   Last Vitals:  Vitals:   12/05/22 1335 12/05/22 1345  BP: 113/73 118/74  Pulse: 94 (!) 109  Resp: 16 17  Temp: 37 C   SpO2: 97% 93%    Last Pain:  Vitals:   12/05/22 1335  PainSc: Asleep                 Jermarion Poffenberger L Damaso Laday

## 2022-12-05 NOTE — Transfer of Care (Signed)
Immediate Anesthesia Transfer of Care Note  Patient: Angalina Ante  Procedure(s) Performed: RIGHT HUMERUS HARDWARE REMOVAL (Right: Arm Upper)  Patient Location: PACU  Anesthesia Type:General  Level of Consciousness: awake, alert , and oriented  Airway & Oxygen Therapy: Patient Spontanous Breathing and Patient connected to face mask oxygen  Post-op Assessment: Report given to RN and Post -op Vital signs reviewed and stable  Post vital signs: Reviewed and stable  Last Vitals:  Vitals Value Taken Time  BP 113/73 12/05/22 1334  Temp    Pulse 86 12/05/22 1335  Resp 16 12/05/22 1335  SpO2 97 % 12/05/22 1335  Vitals shown include unfiled device data.  Last Pain:  Vitals:   12/05/22 0800  PainSc: 0-No pain         Complications: No notable events documented.

## 2022-12-07 ENCOUNTER — Encounter (HOSPITAL_COMMUNITY): Payer: Self-pay | Admitting: Orthopedic Surgery

## 2022-12-12 ENCOUNTER — Ambulatory Visit: Payer: 59 | Admitting: Gastroenterology

## 2022-12-12 NOTE — Progress Notes (Deleted)
GI Office Note    Referring Provider: Lindaann Slough, DO Primary Care Physician:  Lindaann Slough, DO Primary Gastroenterologist: Hennie Duos. Marletta Lor, DO  Date:  12/12/2022  ID:  Laura Mcgee, DOB 06/23/1950, MRN [id number]   Chief Complaint   No chief complaint on file.  History of Present Illness  Laura Mcgee is a 72 y.o. female with a history of *** presenting today with complaint of   EGD and colonoscopy January 2017. She had a Schatzki ring, Bravo capsule placed, moderate nonerosive gastritis. Bravo study showed nonacid reflux on PPI. 7 colorectal polyps removed, one was a simple adenoma the others hyperplastic. Left colon redundant. Small internal hemorrhoids. Next colonoscopy 5 years (2022).    Last seen in the office September 2020 for change in bowel function.Complains of alternating constipation and diarrhea present for about 1 year.  Varying stool consistency noted.  Having pressure bowel movements but not necessarily go.  Had 1 soft bloody mucus.  Reported passage of stool when trying to pass gas.  No more than 2 bowel movements per day.  Has occasional nausea and pain in the right rib.  Currently intermittent and dull in nature.  May go a week without symptoms.  Unsure if related to meals or bowel movements.  Previous imaging workup noted with unremarkable gallbladder, evidence of fatty liver on ultrasound in 2019, CT A/P in 2020 with marked enlargement of both adrenal glands, severe DDD, severe spinal stenosis in the lumbar area.  May be taking hydrocodone x 3 for back pain.  He has a fiber supplement to help regulate bowel function.  Given low-dose Bentyl for abdominal pain and frequent loose stools.  Advised to assess for constipation or drowsiness.  Labs ordered to screen for celiac, thyroid, anemia.   Labs in November 2021 elevated IgA, negative TTG IgA.  Also with normal TSH and T4 at that time, negative lipase.   Labs 06/26/2021: TSH 0.169, T - 41.31   Labs 04/01/22:  Hgb 16.7, WBC 11.6, plts 285, BMP unremarkable.  Last office visit 05/03/22. *** Patient refused colonoscopy therefore Cologuard ordered.  Advised HFP and lipase.  Check RUQ [place].  I start Colace 100 mg 1-2 times daily and MiraLAX 17 gram nightly.  Advise she may benefit from repeat EGD for nausea, RUQ pain, weight loss if ultrasound negative.   Patient reported she received Cologuard in the mail in March but given some vaginal and rectal bleeding she stated she could not complete it.  RUQ [place] 05/11/2022: -***  Today: Constipation -   RUQ pain -   Nausea/weight loss -   Current Outpatient Medications  Medication Sig Dispense Refill   acetaminophen (TYLENOL) 500 MG tablet Take 2 tablets (1,000 mg total) by mouth every 8 (eight) hours for 14 days. 84 tablet 0   amLODipine (NORVASC) 5 MG tablet Take 5 mg by mouth in the morning.     baclofen (LIORESAL) 10 MG tablet Take 10 mg by mouth daily as needed for muscle spasms (laryngospasm).     celecoxib (CELEBREX) 100 MG capsule Take 1 capsule (100 mg total) by mouth daily for 14 days. 14 capsule 0   docusate sodium (COLACE) 100 MG capsule Take 100 mg by mouth every other day.     lidocaine (XYLOCAINE) 2 % jelly Apply 1 Application topically as needed. 30 mL 3   lovastatin (MEVACOR) 20 MG tablet Take 20 mg by mouth in the morning.     olmesartan (BENICAR) 20 MG tablet Take  20 mg by mouth in the morning.     ondansetron (ZOFRAN) 4 MG tablet Take 1 tablet (4 mg total) by mouth every 8 (eight) hours as needed for up to 14 days for nausea or vomiting. 15 tablet 0   oxyCODONE (ROXICODONE) 5 MG immediate release tablet Take 1 tablet (5 mg total) by mouth every 4 (four) hours as needed for up to 7 days. 30 tablet 0   Propylene Glycol (SYSTANE COMPLETE OP) Place 1 drop into both eyes in the morning, at noon, and at bedtime.     venlafaxine XR (EFFEXOR-XR) 150 MG 24 hr capsule Take 2 capsules (300 mg total) by mouth every evening. (Patient taking differently:  Take 150 mg by mouth in the morning and at bedtime.) 60 capsule 0   No current facility-administered medications for this visit.    Past Medical History:  Diagnosis Date   Arthritis    Atrial fibrillation (HCC)    Cancer (HCC)    Vaginal   COPD (chronic obstructive pulmonary disease) (HCC)    Depression    Difficult intubation    History of laryngospasm   Dysrhythmia    GERD (gastroesophageal reflux disease)    Hyperlipidemia    Hypertension    Numbness and tingling    Pulmonary embolism (HCC) 1973    Past Surgical History:  Procedure Laterality Date   CERVIX LESION DESTRUCTION     throat   COLONOSCOPY WITH PROPOFOL N/A 04/07/2015   SLF: 1. seven colorectal polyps removed 2. the left colon is redundant 3. small internal hemorrhoids (1 simple adenoma, 6 hyperplastic) . Repeat in 5 years.   CYST EXCISION Left    Arm   ESOPHAGOGASTRODUODENOSCOPY (EGD) WITH PROPOFOL N/A 04/07/2015   SLF: 1. Schatzki ring 2. Bravo capsule 34 cm from the teeth 3. moderate non-erosive gastritis.    HARDWARE REMOVAL Right 12/05/2022   Procedure: RIGHT HUMERUS HARDWARE REMOVAL;  Surgeon: Oliver Barre, MD;  Location: [facility];  Service: Orthopedics;  Laterality: Right;  NEEDS RNFA   HUMERUS IM NAIL Right 06/25/2021   Procedure: INTRAMEDULLARY (IM) NAIL HUMERAL;  Surgeon: Oliver Barre, MD;  Location: [facility];  Service: Orthopedics;  Laterality: Right;   MASS EXCISION N/A 2002   Lanyx   MASS EXCISION Left 01/21/2015   Procedure: EXCISION CYST LEFT UPPER ARM;  Surgeon: Franky Macho, MD;  Location: [facility];  Service: General;  Laterality: Left;   MICROLARYNGOSCOPY N/A 05/31/2016   Procedure: MICRO LARYNGOSCOPY WITH BIOPSY OF VOCAL CORD LESION;  Surgeon: Newman Pies, MD;  Location: [facility];  Service: ENT;  Laterality: N/A;   POLYPECTOMY N/A 04/07/2015   Procedure: POLYPECTOMY;  Surgeon: West Bali, MD;  [facility];  Service: Endoscopy;  Laterality: N/A;  Descending colon  polyps x 3    SAVORY DILATION N/A 04/07/2015   Procedure: SAVORY DILATION;  Surgeon: West Bali, MD;  [facility];  Service: Endoscopy;  Laterality: N/A;   VULVECTOMY N/A 07/12/2022   Procedure: SIMPLE PARTIAL VULVECTOMY, ADVANCEMENT FLAP;  Surgeon: Clide Cliff, MD;  Location: [facility];  Service: Gynecology;  Laterality: N/A;    Family History  Problem Relation Age of Onset   Pneumonia Mother    Aneurysm Father    Kidney cancer Sister    Lung cancer Sister    Esophageal cancer Brother    Melanoma Maternal Grandfather    Diabetes Daughter    Colon cancer Neg Hx    Breast cancer Neg  Hx    Ovarian cancer Neg Hx    Endometrial cancer Neg Hx    Pancreatic cancer Neg Hx    Prostate cancer Neg Hx     Allergies as of 12/12/2022 - Review Complete 12/05/2022  Allergen Reaction Noted   Gabapentin Other (See Comments) 05/22/2019   Tetracyclines & related Other (See Comments) 01/15/2015    Social History   Socioeconomic History   Marital status: Single    Spouse name: Not on file   Number of children: 1   Years of education: some college   Highest education level: Not on file  Occupational History   Occupation: Retired  Tobacco Use   Smoking status: Every Day    Current packs/day: 1.00    Average packs/day: 1 pack/day for 25.0 years (25.0 ttl pk-yrs)    Types: Cigarettes    Passive exposure: Current   Smokeless tobacco: Never  Vaping Use   Vaping status: Never Used  Substance and Sexual Activity   Alcohol use: Yes    Comment: Drinks two beer per night.   Drug use: Not Currently    Types: Marijuana    Comment: daily marijuana use, not since 03/2022   Sexual activity: Not Currently    Birth control/protection: Post-menopausal  Other Topics Concern   Not on file  Social History Narrative   Lives at home alone.   Right-handed.   Two cups sweet tea daily.   Social Determinants of Health   Financial Resource Strain: Low Risk  (11/30/2022)   Received  [facility]   Overall Financial Resource Strain (CARDIA)    Difficulty of Paying Living Expenses: Not hard at all  Food Insecurity: No Food Insecurity (11/30/2022)   Received from [facility]   Hunger Vital Sign    Worried About Running Out of Food in the Last Year: Never true    Ran Out of Food in the Last Year: Never true  Transportation Needs: No Transportation Needs (11/30/2022)   Received from [facility] - Transportation    Lack of Transportation (Medical): No    Lack of Transportation (Non-Medical): No  Physical Activity: Sufficiently Active (11/30/2022)   Received from [facility]   Exercise Vital Sign    Days of Exercise per Week: 5 days    Minutes of Exercise per Session: 30 min  Stress: No Stress Concern Present (11/30/2022)   Received from [facility] - Occupational Stress Questionnaire    Feeling of Stress : Not at all  Social Connections: Moderately Integrated (11/30/2022)   Received from [facility]   Social Connection and Isolation Panel [NHANES]    Frequency of Communication with Friends and Family: Three times a week    Frequency of Social Gatherings with Friends and Family: Three times a week    Attends Religious Services: 1 to 4 times per year    Active Member of Clubs or Organizations: No    Attends Banker Meetings: 1 to 4 times per year    Marital Status: Never married     Review of Systems   Gen: Denies fever, chills, anorexia. Denies fatigue, weakness, weight loss.  CV: Denies chest pain, palpitations, syncope, peripheral edema, and claudication. Resp: Denies dyspnea at rest, cough, wheezing, coughing up blood, and pleurisy. GI: See HPI Derm: Denies rash, itching, dry skin Psych: Denies depression, anxiety, memory loss, confusion. No homicidal or suicidal ideation.  Heme:  Denies bruising, bleeding, and enlarged lymph nodes.   Physical Exam   LMP 06/20/1997  (Approximate) Comment: post menopausal  General:   Alert and oriented. No distress noted. Pleasant and cooperative.  Head:  Normocephalic and atraumatic. Eyes:  Conjuctiva clear without scleral icterus. Mouth:  Oral mucosa pink and moist. Good dentition. No lesions. Lungs:  Clear to auscultation bilaterally. No wheezes, rales, or rhonchi. No distress.  Heart:  S1, S2 present without murmurs appreciated.  Abdomen:  +BS, soft, non-tender and non-distended. No rebound or guarding. No HSM or masses noted. Rectal: *** Msk:  Symmetrical without gross deformities. Normal posture. Extremities:  Without edema. Neurologic:  Alert and  oriented x4 Psych:  Alert and cooperative. Normal mood and affect.   Assessment  Kohana Rongey is a 72 y.o. female with a history of *** presenting today with    PLAN   ***     Brooke Bonito, MSN, FNP-BC, AGACNP-BC [facility]

## 2022-12-16 ENCOUNTER — Other Ambulatory Visit (INDEPENDENT_AMBULATORY_CARE_PROVIDER_SITE_OTHER): Payer: 59

## 2022-12-16 ENCOUNTER — Encounter: Payer: Self-pay | Admitting: Orthopedic Surgery

## 2022-12-16 ENCOUNTER — Ambulatory Visit: Payer: 59 | Admitting: Orthopedic Surgery

## 2022-12-16 DIAGNOSIS — Z9889 Other specified postprocedural states: Secondary | ICD-10-CM

## 2022-12-16 DIAGNOSIS — S42331D Displaced oblique fracture of shaft of humerus, right arm, subsequent encounter for fracture with routine healing: Secondary | ICD-10-CM

## 2022-12-16 NOTE — Progress Notes (Signed)
Orthopaedic Postop Note  Assessment: Laura Mcgee is a 72 y.o. female s/p IM nail of right humerus shaft fracture  DOS: 06/25/2021  Plan: Mrs. Laura Mcgee is doing better following surgery.  She does continue to have some pain in her right shoulder.  We reviewed radiographs, which demonstrates severe glenohumeral joint arthritis.  Otherwise, hardware was removed, and the remainder of the x-ray appears stable.  Encouraged her to gently start increasing her range of motion.  There are no restrictions.  If she continues to have issues, would recommend trying injections, including a glenohumeral joint injection.  She states her understanding.  The bruising she has experienced that should also continue to improve.   Follow-up: 1 month  XR at next visit: R shoulder  Subjective:  Chief Complaint  Patient presents with   Routine Post Op    /R hardware removal  DOS 12/05/22    History of Present Illness: Laura Mcgee is a 72 y.o. female who returns following the above stated procedure.  Removal of hardware was approximately 2 weeks ago.  She has done well.  She is no longer using a sling.  She continues to have pain in her right shoulder.  She has noticed a lot of bruising in the right arm.  review of Systems: No fevers or chills No numbness or tingling No Chest Pain No shortness of breath   Objective: LMP 06/20/1997 (Approximate) Comment: post menopausal  Physical Exam:  Alert and oriented.  No acute distress.  Diffuse bruising in the upper arm, extending distally to the elbow.  Limited range of motion of the right shoulder.  No point tenderness.  Recent surgical incisions are healing well.  There is no surrounding erythema or drainage.  Sensation intact in the axillary nerve distribution.  Fingers are warm and well-perfused.  IMAGING: I personally ordered and reviewed the following images:  X-rays of the right shoulder were obtained in clinic today.  No acute injuries.  Interval  removal of hardware, without obvious injury.  Advanced glenohumeral joint arthritis, with inferior osteophytes.  Glenohumeral joint is reduced.  No new injuries.  No bony lesions.  Impression: Right shoulder x-rays with interval removal of humeral nail, and advanced glenohumeral joint arthritis  Oliver Barre, MD 12/16/2022 11:33 AM

## 2022-12-18 NOTE — Progress Notes (Unsigned)
New Patient Office Visit   Subjective   Patient ID: Laura Mcgee, female    DOB: Sep 07, 1950  Age: 72 y.o. MRN: [id number]  CC: No chief complaint on file.   HPI Laura Mcgee 72 year old female, presents to establish care. She  has a past medical history of Arthritis, Atrial fibrillation (HCC), Cancer (HCC), COPD (chronic obstructive pulmonary disease) (HCC), Depression, Difficult intubation, Dysrhythmia, GERD (gastroesophageal reflux disease), Hyperlipidemia, Hypertension, Numbness and tingling, and Pulmonary embolism (HCC) (1973).  HPI    Outpatient Encounter Medications as of 12/19/2022  Medication Sig   acetaminophen (TYLENOL) 500 MG tablet Take 2 tablets (1,000 mg total) by mouth every 8 (eight) hours for 14 days.   amLODipine (NORVASC) 5 MG tablet Take 5 mg by mouth in the morning.   baclofen (LIORESAL) 10 MG tablet Take 10 mg by mouth daily as needed for muscle spasms (laryngospasm).   celecoxib (CELEBREX) 100 MG capsule Take 1 capsule (100 mg total) by mouth daily for 14 days.   docusate sodium (COLACE) 100 MG capsule Take 100 mg by mouth every other day.   lidocaine (XYLOCAINE) 2 % jelly Apply 1 Application topically as needed.   lovastatin (MEVACOR) 20 MG tablet Take 20 mg by mouth in the morning.   olmesartan (BENICAR) 20 MG tablet Take 20 mg by mouth in the morning.   ondansetron (ZOFRAN) 4 MG tablet Take 1 tablet (4 mg total) by mouth every 8 (eight) hours as needed for up to 14 days for nausea or vomiting.   Propylene Glycol (SYSTANE COMPLETE OP) Place 1 drop into both eyes in the morning, at noon, and at bedtime.   venlafaxine XR (EFFEXOR-XR) 150 MG 24 hr capsule Take 2 capsules (300 mg total) by mouth every evening. (Patient taking differently: Take 150 mg by mouth in the morning and at bedtime.)   No facility-administered encounter medications on file as of 12/19/2022.    Past Surgical History:  Procedure Laterality Date   CERVIX LESION DESTRUCTION     throat    COLONOSCOPY WITH PROPOFOL N/A 04/07/2015   SLF: 1. seven colorectal polyps removed 2. the left colon is redundant 3. small internal hemorrhoids (1 simple adenoma, 6 hyperplastic) . Repeat in 5 years.   CYST EXCISION Left    Arm   ESOPHAGOGASTRODUODENOSCOPY (EGD) WITH PROPOFOL N/A 04/07/2015   SLF: 1. Schatzki ring 2. Bravo capsule 34 cm from the teeth 3. moderate non-erosive gastritis.    HARDWARE REMOVAL Right 12/05/2022   Procedure: RIGHT HUMERUS HARDWARE REMOVAL;  Surgeon: Oliver Barre, MD;  Location: [facility];  Service: Orthopedics;  Laterality: Right;  NEEDS RNFA   HUMERUS IM NAIL Right 06/25/2021   Procedure: INTRAMEDULLARY (IM) NAIL HUMERAL;  Surgeon: Oliver Barre, MD;  Location: [facility];  Service: Orthopedics;  Laterality: Right;   MASS EXCISION N/A 2002   Lanyx   MASS EXCISION Left 01/21/2015   Procedure: EXCISION CYST LEFT UPPER ARM;  Surgeon: Franky Macho, MD;  Location: [facility];  Service: General;  Laterality: Left;   MICROLARYNGOSCOPY N/A 05/31/2016   Procedure: MICRO LARYNGOSCOPY WITH BIOPSY OF VOCAL CORD LESION;  Surgeon: Newman Pies, MD;  Location: [facility];  Service: ENT;  Laterality: N/A;   POLYPECTOMY N/A 04/07/2015   Procedure: POLYPECTOMY;  Surgeon: West Bali, MD;  [facility];  Service: Endoscopy;  Laterality: N/A;  Descending colon polyps x 3    SAVORY DILATION N/A 04/07/2015   Procedure: SAVORY DILATION;  Surgeon: Duncan Dull  Loreen Freud, MD;  [facility];  Service: Endoscopy;  Laterality: N/A;   VULVECTOMY N/A 07/12/2022   Procedure: SIMPLE PARTIAL VULVECTOMY, ADVANCEMENT FLAP;  Surgeon: Clide Cliff, MD;  Location: [facility];  Service: Gynecology;  Laterality: N/A;    ROS    Objective    LMP 06/20/1997 (Approximate) Comment: post menopausal  Physical Exam    Assessment & Plan:  Osteoporosis, unspecified osteoporosis type, unspecified pathological fracture presence  Encounter for screening mammogram for malignant neoplasm  of breast  Encounter for screening for cardiovascular disorders  IFG (impaired fasting glucose)  TSH (thyroid-stimulating hormone deficiency)  Vitamin D deficiency  Need for hepatitis C screening test  Vitamin B12 deficiency    No follow-ups on file.   Cruzita Lederer Newman Nip, FNP

## 2022-12-19 ENCOUNTER — Ambulatory Visit: Payer: 59 | Admitting: Family Medicine

## 2022-12-19 DIAGNOSIS — Z1231 Encounter for screening mammogram for malignant neoplasm of breast: Secondary | ICD-10-CM

## 2022-12-19 DIAGNOSIS — Z136 Encounter for screening for cardiovascular disorders: Secondary | ICD-10-CM

## 2022-12-19 DIAGNOSIS — E038 Other specified hypothyroidism: Secondary | ICD-10-CM

## 2022-12-19 DIAGNOSIS — E559 Vitamin D deficiency, unspecified: Secondary | ICD-10-CM

## 2022-12-19 DIAGNOSIS — M810 Age-related osteoporosis without current pathological fracture: Secondary | ICD-10-CM

## 2022-12-19 DIAGNOSIS — Z1159 Encounter for screening for other viral diseases: Secondary | ICD-10-CM

## 2022-12-19 DIAGNOSIS — R7301 Impaired fasting glucose: Secondary | ICD-10-CM

## 2022-12-19 DIAGNOSIS — E538 Deficiency of other specified B group vitamins: Secondary | ICD-10-CM

## 2022-12-19 NOTE — Progress Notes (Unsigned)
No show Appointment  This encounter was created in error - please disregard.

## 2022-12-21 ENCOUNTER — Encounter: Payer: Self-pay | Admitting: Family Medicine

## 2023-01-11 ENCOUNTER — Telehealth: Payer: Self-pay | Admitting: Internal Medicine

## 2023-01-11 ENCOUNTER — Ambulatory Visit: Payer: 59 | Admitting: Orthopedic Surgery

## 2023-01-11 VITALS — BP 163/111 | HR 74 | Ht 66.0 in | Wt 152.0 lb

## 2023-01-11 DIAGNOSIS — M25511 Pain in right shoulder: Secondary | ICD-10-CM

## 2023-01-11 DIAGNOSIS — Z9889 Other specified postprocedural states: Secondary | ICD-10-CM

## 2023-01-11 DIAGNOSIS — T8484XA Pain due to internal orthopedic prosthetic devices, implants and grafts, initial encounter: Secondary | ICD-10-CM

## 2023-01-11 MED ORDER — HYDROCODONE-ACETAMINOPHEN 5-325 MG PO TABS: 1.0000 | ORAL_TABLET | Freq: Four times a day (QID) | ORAL | 0 refills | Status: DC | PRN

## 2023-01-11 NOTE — Telephone Encounter (Signed)
Needs new patient approval. Does not want to see no NP. Request Dr Durwin Nora.

## 2023-01-11 NOTE — Patient Instructions (Signed)

## 2023-01-12 ENCOUNTER — Encounter: Payer: Self-pay | Admitting: Orthopedic Surgery

## 2023-01-12 ENCOUNTER — Telehealth: Payer: Self-pay

## 2023-01-12 NOTE — Progress Notes (Signed)
Orthopaedic Postop Note  Assessment: Laura Mcgee is a 72 y.o. female s/p IM nail of right humerus shaft fracture  DOS: 06/25/2021  Plan: Laura Mcgee has recovered following hardware removal.  She does continue to have restricted motion, as well as pain in the right shoulder.  We reviewed prior x-rays, which demonstrates advanced glenohumeral joint changes.  She states her understanding.  At this point, I am recommending a steroid injection, which could help with some of her symptoms.  I provided her with a limited supply of hydrocodone.  I advised her that I would not continue to provide this prescription.  She states her understanding.  We also briefly discussed shoulder replacements, as this would be very helpful for her pain.  She would like to avoid surgery at this time.  Injection was completed in clinic today.  She will follow-up as needed.  Procedure note injection - Right shoulder    Verbal consent was obtained to inject the right shoulder, subacromial space Timeout was completed to confirm the site of injection.   The skin was prepped with alcohol and ethyl chloride was sprayed at the injection site.  A 21-gauge needle was used to inject 40 mg of Depo-Medrol and 1% lidocaine (4 cc) into the subacromial space of the right shoulder using a posterolateral approach.  There were no complications.  A sterile bandage was applied.     Follow-up: PRN  XR at next visit: R shoulder  Subjective:  Chief Complaint  Patient presents with   Post-op Follow-up    Right shoulder hardware removal / has pain with ROM     History of Present Illness: Laura Mcgee is a 72 y.o. female who returns following the above stated procedure.  Removal of hardware was approximately 6 weeks ago.  She has recovered following surgery.  No numbness or tingling.  She continues to have pain in the right shoulder.  She has difficulty with overhead motion.  She states that she has lost a lot of weight, and  continues to lose weight.  She does not have a primary care provider, that she trusts.   review of Systems: No fevers or chills No numbness or tingling No Chest Pain No shortness of breath   Objective: BP (!) 163/111 Comment: patient will monitor see PCP if stays high  Pulse 74   Ht 5\' 6"  (1.676 m)   Wt 152 lb (68.9 kg)   LMP 06/20/1997 (Approximate) Comment: post menopausal  BMI 24.53 kg/m   Physical Exam:  Alert and oriented.  No acute distress.  Mild digital bruising.  Surgical incisions are healed.  Forward flexion limited to 90 degrees.  Abduction to 90 degrees.  She does walk with a cane, and is using her right arm to hold the cane.  IMAGING: I personally ordered and reviewed the following images:  No new imaging obtained today.  Oliver Barre, MD 01/12/2023 9:24 AM

## 2023-01-12 NOTE — Telephone Encounter (Signed)
Copied from CRM [id number]. Topic: General - Other >> Jan 12, 2023 12:36 PM [name] wrote: Reason for CRM: Laura Mcgee called in stating that she stopped by the office yesterday (01/11/23) to setup an appointment with an MD only. Stated front desk told her they would send to Dr. Durwin Nora for approval and she hasn't heard anything back. Advised patient that he has to approve it first which may take a few days. Patient is waiting to hear back. CB # [phone].

## 2023-01-13 NOTE — Telephone Encounter (Signed)
Patient informed. 

## 2023-01-16 ENCOUNTER — Telehealth: Payer: Self-pay

## 2023-01-16 ENCOUNTER — Encounter: Payer: Self-pay | Admitting: Gastroenterology

## 2023-01-16 ENCOUNTER — Telehealth: Payer: Self-pay | Admitting: *Deleted

## 2023-01-16 ENCOUNTER — Other Ambulatory Visit: Payer: Self-pay | Admitting: *Deleted

## 2023-01-16 ENCOUNTER — Ambulatory Visit (INDEPENDENT_AMBULATORY_CARE_PROVIDER_SITE_OTHER): Payer: 59 | Admitting: Gastroenterology

## 2023-01-16 VITALS — BP 138/88 | HR 118 | Temp 97.9°F | Ht 66.0 in | Wt 152.6 lb

## 2023-01-16 DIAGNOSIS — Z860100 Personal history of colon polyps, unspecified: Secondary | ICD-10-CM

## 2023-01-16 DIAGNOSIS — R110 Nausea: Secondary | ICD-10-CM | POA: Diagnosis not present

## 2023-01-16 DIAGNOSIS — R1011 Right upper quadrant pain: Secondary | ICD-10-CM

## 2023-01-16 DIAGNOSIS — R634 Abnormal weight loss: Secondary | ICD-10-CM

## 2023-01-16 DIAGNOSIS — K5900 Constipation, unspecified: Secondary | ICD-10-CM

## 2023-01-16 NOTE — Progress Notes (Unsigned)
GI Office Note    Referring Provider: Lindaann Slough, DO Primary Care Physician:  Lindaann Slough, DO Primary Gastroenterologist: Hennie Duos. Marletta Lor, DO  Date:  01/16/2023  ID:  Laura Mcgee, DOB 1951-02-08, MRN [id number]   Chief Complaint   Chief Complaint  Patient presents with   Abdominal Pain    Pain in right side. Nausea, losing weight and constipation.    History of Present Illness  Laura Mcgee is a 72 y.o. female with a history of depression, A-fib, COPD, HLD, HTN, PE in 1973, GERD, laryngeal spasm, and chronic right upper quadrant pain presenting today with complaint of right side abdominal pain, nausea, weight loss, and constipation.  EGD and colonoscopy January 2017. She had a Schatzki ring, Bravo capsule placed, moderate nonerosive gastritis. Bravo study showed nonacid reflux on PPI. 7 colorectal polyps removed, one was a simple adenoma the others hyperplastic. Left colon redundant. Small internal hemorrhoids. Next colonoscopy 5 years (2022).   Labs in November 2021 with elevated IgA, negative TTG IgA.  Normal thyroid function and negative lipase.  Last office visit 05/03/22.  Has urge use bathroom in the mornings has a commode for a while but then unable to go.  States this initially hard to push out stools and then her stools will be large.  Continues to have the urgency.  Denies eating during the day, admits to eating after 6 PM as this is a chronic habit.  When eating at night she has pain and nausea.  Sometimes when she does eat she has nausea and pain usually occurs in the right upper quadrant and is intermittent.  Declined colonoscopy.  Denied any melena or BRBPR.  Reported 10 pound weight loss within the prior 6 weeks and decreased appetite.  Offered Cologuard as she refused colonoscopy.  Advise she may benefit from a an upper endoscopy.  Check LFTs and order right upper quadrant ultrasound.  Advised Colace twice daily and MiraLAX nightly.  RUQ [place]  05/11/22: -Positive Murphy sign noted by sonographer -CBD measuring 4 mm -Fatty liver -Advised CT scan of the abdomen pelvis if she did not want to try pantoprazole or omeprazole.  CT A/P never performed.  Today:  She reports she saw GYN and then saw oncology. 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   Has lost appetite completely. Sometimes the thought of food makes her queasy. Pain is in the RUQ mostly and comes and goes but has noticed and increased frequency. Does not vomit. Will eat a half of a meal she prepares. For protein - she drinks a lot of milk and drinks it with every meals. She will eat shrimp. Does not eat chicken or [place]. Sometimes will eat pork or beef, she does like steak but only will eat a half of one and may eat 1/3 a baked potato.   Does not have much energy. Has not tried protein shakes.   Had a bad fall in April and had a big head lac. Has been unable to find a PCP.   BMs are strange she reports. When she gest up she has urgency to go but has been feeling the need to strain and then pain goes away. Has not been taking any stool softeners she knows of. Actually is taking a senna docusate combo - has been trying to  not take it at all. Has not taken any miralax.   She states she has tried omeprazole and pantoprazole and does not feel ike that is helpful.   Wt Readings from Last 3 Encounters:  01/16/23 152 lb 9.6 oz (69.2 kg)  01/11/23 152 lb (68.9 kg)  12/05/22 154 lb 5.2 oz (70 kg)  05/03/22           169 lb  Current Outpatient Medications  Medication Sig Dispense Refill   amLODipine (NORVASC) 5 MG tablet Take 5 mg by mouth in the morning.     baclofen (LIORESAL) 10 MG tablet Take 10 mg by mouth daily as needed for muscle spasms (laryngospasm).     lovastatin (MEVACOR) 20 MG tablet Take 20 mg by mouth in the morning.      olmesartan (BENICAR) 20 MG tablet Take 20 mg by mouth in the morning.     Propylene Glycol (SYSTANE COMPLETE OP) Place 1 drop into both eyes in the morning, at noon, and at bedtime.     venlafaxine XR (EFFEXOR-XR) 150 MG 24 hr capsule Take 2 capsules (300 mg total) by mouth every evening. (Patient taking differently: Take 150 mg by mouth in the morning and at bedtime.) 60 capsule 0   docusate sodium (COLACE) 100 MG capsule Take 100 mg by mouth every other day. (Patient not taking: Reported on 01/16/2023)     HYDROcodone-acetaminophen (NORCO/VICODIN) 5-325 MG tablet Take 1 tablet by mouth every 6 (six) hours as needed. (Patient not taking: Reported on 01/16/2023) 30 tablet 0   lidocaine (XYLOCAINE) 2 % jelly Apply 1 Application topically as needed. (Patient not taking: Reported on 01/16/2023) 30 mL 3   No current facility-administered medications for this visit.    Past Medical History:  Diagnosis Date   Arthritis    Atrial fibrillation (HCC)    Cancer (HCC)    Vaginal   COPD (chronic obstructive pulmonary disease) (HCC)    Depression    Difficult intubation    History of laryngospasm   Dysrhythmia    GERD (gastroesophageal reflux disease)    Hyperlipidemia    Hypertension    Numbness and tingling    Pulmonary embolism (HCC) 1973    Past Surgical History:  Procedure Laterality Date   CERVIX LESION DESTRUCTION     throat   COLONOSCOPY WITH PROPOFOL N/A 04/07/2015   SLF: 1. seven colorectal polyps removed 2. the left colon is redundant 3. small internal hemorrhoids (1 simple adenoma, 6 hyperplastic) . Repeat in 5 years.   CYST EXCISION Left    Arm   ESOPHAGOGASTRODUODENOSCOPY (EGD) WITH PROPOFOL N/A 04/07/2015   SLF: 1. Schatzki ring 2. Bravo capsule 34 cm from the teeth 3. moderate non-erosive gastritis.    HARDWARE REMOVAL Right 12/05/2022   Procedure: RIGHT HUMERUS HARDWARE REMOVAL;  Surgeon: Oliver Barre, MD;  Location: [facility];  Service: Orthopedics;  Laterality: Right;   NEEDS RNFA   HUMERUS IM NAIL Right 06/25/2021   Procedure: INTRAMEDULLARY (IM) NAIL HUMERAL;  Surgeon: Oliver Barre, MD;  Location: [facility];  Service: Orthopedics;  Laterality: Right;   MASS EXCISION N/A 2002   Lanyx   MASS EXCISION Left 01/21/2015   Procedure: EXCISION CYST LEFT UPPER ARM;  Surgeon: Franky Macho, MD;  Location: [facility];  Service: General;  Laterality: Left;   MICROLARYNGOSCOPY N/A 05/31/2016   Procedure: MICRO LARYNGOSCOPY WITH BIOPSY OF VOCAL CORD LESION;  Surgeon: Newman Pies, MD;  Location: [facility];  Service: ENT;  Laterality: N/A;   POLYPECTOMY N/A 04/07/2015   Procedure: POLYPECTOMY;  Surgeon: West Bali, MD;  Location: [facility];  Service: Endoscopy;  Laterality: N/A;  Descending colon polyps x 3    SAVORY DILATION N/A 04/07/2015   Procedure: SAVORY DILATION;  Surgeon: West Bali, MD;  Location: [facility];  Service: Endoscopy;  Laterality: N/A;   VULVECTOMY N/A 07/12/2022   Procedure: SIMPLE PARTIAL VULVECTOMY, ADVANCEMENT FLAP;  Surgeon: Clide Cliff, MD;  Location: [facility];  Service: Gynecology;  Laterality: N/A;    Family History  Problem Relation Age of Onset   Pneumonia Mother    Aneurysm Father    Kidney cancer Sister    Lung cancer Sister    Esophageal cancer Brother    Melanoma Maternal Grandfather    Diabetes Daughter    Colon cancer Neg Hx    Breast cancer Neg Hx    Ovarian cancer Neg Hx    Endometrial cancer Neg Hx    Pancreatic cancer Neg Hx    Prostate cancer Neg Hx     Allergies as of 01/16/2023 - Review Complete 01/16/2023  Allergen Reaction Noted   Gabapentin Other (See Comments) 05/22/2019   Tetracyclines & related Other (See Comments) 01/15/2015    Social History   Socioeconomic History   Marital status: Single    Spouse name: Not on file   Number of children: 1   Years of education: some college   Highest education level: Not on file  Occupational History   Occupation: Retired  Tobacco Use    Smoking status: Every Day    Current packs/day: 1.00    Average packs/day: 1 pack/day for 25.0 years (25.0 ttl pk-yrs)    Types: Cigarettes    Passive exposure: Current   Smokeless tobacco: Never  Vaping Use   Vaping status: Never Used  Substance and Sexual Activity   Alcohol use: Yes    Comment: Drinks two beer per night.   Drug use: Not Currently    Types: Marijuana    Comment: daily marijuana use, not since 03/2022   Sexual activity: Not Currently    Birth control/protection: Post-menopausal  Other Topics Concern   Not on file  Social History Narrative   Lives at home alone.   Right-handed.   Two cups sweet tea daily.   Social Determinants of Health   Financial Resource Strain: Low Risk  (11/30/2022)   Received from [facility]   Overall Financial Resource Strain (CARDIA)    Difficulty of Paying Living Expenses: Not hard at all  Food Insecurity: No Food Insecurity (11/30/2022)   [facility]   Hunger Vital Sign    Worried About Running Out of Food in the Last Year: Never true    Ran Out of Food in the Last Year: Never true  Transportation Needs: No Transportation Needs (11/30/2022)   Received from [facility] - Transportation    Lack of Transportation (Medical): No    Lack of Transportation (Non-Medical): No  Physical Activity: Sufficiently Active (11/30/2022)   Received from [facility]   Exercise Vital Sign    Days of Exercise per Week: 5 days    Minutes of Exercise per Session: 30 min  Stress: No Stress Concern Present (11/30/2022)   Received from [facility] - Occupational Stress Questionnaire    Feeling of Stress : Not at all  Social Connections: Moderately Integrated (  11/30/2022)   Received from [facility]   Social Connection and Isolation Panel [NHANES]    Frequency of Communication with Friends and Family: Three times a week    Frequency of Social Gatherings with Friends  and Family: Three times a week    Attends Religious Services: 1 to 4 times per year    Active Member of Clubs or Organizations: No    Attends Banker Meetings: 1 to 4 times per year    Marital Status: Never married     Review of Systems   Gen: Denies fever, chills, anorexia. Denies fatigue, weakness, weight loss.  CV: Denies chest pain, palpitations, syncope, peripheral edema, and claudication. Resp: Denies dyspnea at rest, cough, wheezing, coughing up blood, and pleurisy. GI: See HPI Derm: Denies rash, itching, dry skin Psych: Denies depression, anxiety, memory loss, confusion. No homicidal or suicidal ideation.  Heme: Denies bruising, bleeding, and enlarged lymph nodes.  Physical Exam   BP 138/88 (BP Location: Right Arm, Patient Position: Sitting, Cuff Size: Normal)   Pulse (!) 118   Temp 97.9 F (36.6 C) (Temporal)   Ht 5\' 6"  (1.676 m)   Wt 152 lb 9.6 oz (69.2 kg)   LMP 06/20/1997 (Approximate) Comment: post menopausal  BMI 24.63 kg/m   General:   Alert and oriented. No distress noted. Pleasant and cooperative.  Head:  Normocephalic and atraumatic. Eyes:  Conjuctiva clear without scleral icterus. Lungs:  Clear to auscultation bilaterally. No wheezes, rales, or rhonchi. No distress.  Heart:  S1, S2 present without murmurs appreciated.  Abdomen:  +BS, soft, non-distended.  Mild TTP to right upper quadrant and epigastrium.  No rebound or guarding. No HSM or masses noted. Rectal: deferred Msk:  Symmetrical without gross deformities. Normal posture. Extremities:  Without edema.  Scattered bruising. Neurologic:  Alert and  oriented x4 Psych:  Alert and cooperative. Normal mood and affect.  Assessment  Laura Mcgee is a 72 y.o. female with a history of depression, A-fib, COPD, HLD, HTN, PE in 1973, GERD, laryngeal spasm, and chronic right upper quadrant pain presenting today with complaint of right side abdominal pain, nausea, weight loss, and  constipation.  RUQ pain, nausea, weight loss: Continues to not have much of an appetite.  Earlier this year she is seeing GYN and was diagnosed with vulvar cancer and underwent partial vulvectomy.  Previously offered treatment for GERD to help with appetite and nausea and patient had declined.  Previous right upper quadrant ultrasound with positive Murphy sign by sonographer but no overt Murphy sign on examination today.  Thought with food at times makes her nauseous and not want to eat, for protein she has been drinking a lot of milk, she will eat shrimp but does not care for chicken or [place] and does not eat beef or pork often.  Having significant fatigue as well and has not tried any prior protein shakes or supplements.  Given her weight loss I have recommended a CT scan of the abdomen pelvis as well as to start a daily protein shake and monitor her weight.  Will see her for follow-up in 6 to 8 weeks for further monitoring of her weight.  I also recommended daily famotidine to help treat for potential GERD.  Will again discuss with her the performing upper endoscopy as well as colonoscopy given her weight loss and her upper GI symptoms.  Her last EGD and colonoscopy were in January 2017 and she is currently overdue for surveillance colonoscopy.  Constipation:  Continues to struggle with constipation as well as straining.  Does also experience urgency and sometimes unable to go.  Takes senna/docusate sodium combo as needed.  Has not been using any MiraLAX.  Advised to start MiraLAX once nightly in 8 ounces of water for better bowel regularity.  History of colon polyps: Colonoscopy in January 2017 with 7 polyps removed with recommended 5-year repeat.  Discussed Cologuard at her last office visit which was ordered but never completed given she was having issues with vaginal bleeding and undergoing evaluation and surgery.  It was discussed with her at that time that we could be missing potential malignancy  and she indicated her understanding and continued to decline colonoscopy.  Given her weight loss and constipation as well as her history I have recommended another colonoscopy and we will again assess this at her follow-up after seeing if she continues to have a drop in weight or is able to maintain or gain weight with protein supplementation.  PLAN   Start famotidine 20 mg once daily in the mornings. Miralax 17 g once nightly in 8 oz water GERD diet Start protein shake daily CT A/P with contrast.  Will address colonoscopy for evaluation of her weight loss again at follow-up. Follow up in 6-8 weeks for weight check    Brooke Bonito, MSN, FNP-BC, AGACNP-BC [facility]

## 2023-01-16 NOTE — Patient Instructions (Addendum)
For your right sided pain and burping: Start famotidine 20 mg once daily in the mornings, 30 minutes prior to breakfast. Follow a GERD diet: \ Avoid fried, fatty, greasy, spicy, citrus foods. Avoid caffeine and carbonated beverages. Avoid chocolate. Try eating 4-6 small meals a day rather than 3 large meals. Do not eat within 3 hours of laying down. Prop head of bed up on wood or bricks to create a 6 inch incline.  For your lack of appetite and weight loss: We are scheduling you for a CT scan of your abdomen and pelvis in the near future Start a daily protein shake -Ensure, boost, Premier protein, or fair life shakes  For your constipation: You can try warm apple juice or prune juice Start MiraLAX 17 g once nightly in 8 ounces of fluid of your choice.  Please take this every single night as you have to be consistent with this.  Follow-up in 6 to 8 weeks  It was a pleasure to see you today. I want to create trusting relationships with patients. If you receive a survey regarding your visit,  I greatly appreciate you taking time to fill this out on paper or through your MyChart. I value your feedback.  Brooke Bonito, MSN, FNP-BC, AGACNP-BC [facility]

## 2023-01-16 NOTE — Telephone Encounter (Signed)
[employer] PA: CPT Code 38756 Description: CT ABDOMEN & PELVIS W/ Case Number: [id number] Review Date: 01/16/2023 4:12:14 PM Expiration Date: N/A Status: This member's benefit plan did not require a prior authorization for this request.

## 2023-01-16 NOTE — Telephone Encounter (Signed)
Patient called to let Dr. Dallas Schimke know that the injection she got last Wednesday didn't work. Stated that she was going to wait until next year to do anything about it. She would like a return call to discuss further.

## 2023-01-16 NOTE — Progress Notes (Signed)
error 

## 2023-01-17 NOTE — Telephone Encounter (Signed)
Spoke with pt and let her know information from Dr. Dallas Schimke. Pt states she will give it some more time, but doesn't feel like the injection is working.

## 2023-01-18 DIAGNOSIS — M25511 Pain in right shoulder: Secondary | ICD-10-CM | POA: Diagnosis not present

## 2023-01-18 MED ORDER — METHYLPREDNISOLONE ACETATE 40 MG/ML IJ SUSP
40.0000 mg | Freq: Once | INTRAMUSCULAR | Status: AC
Start: 2023-01-18 — End: 2023-01-18
  Administered 2023-01-18: 40 mg via INTRA_ARTICULAR

## 2023-01-18 NOTE — Addendum Note (Signed)
Addended byCaffie Damme on: 01/18/2023 09:30 AM   Modules accepted: Orders

## 2023-02-08 ENCOUNTER — Ambulatory Visit (HOSPITAL_COMMUNITY)
Admission: RE | Admit: 2023-02-08 | Discharge: 2023-02-08 | Disposition: A | Discharge status: Home / Self Care | Payer: 59 | Source: Ambulatory Visit | Attending: Gastroenterology | Admitting: Gastroenterology

## 2023-02-08 DIAGNOSIS — R1011 Right upper quadrant pain: Secondary | ICD-10-CM | POA: Diagnosis present

## 2023-02-08 DIAGNOSIS — R634 Abnormal weight loss: Secondary | ICD-10-CM | POA: Diagnosis present

## 2023-02-08 DIAGNOSIS — R110 Nausea: Secondary | ICD-10-CM | POA: Insufficient documentation

## 2023-02-08 MED ORDER — IOHEXOL 300 MG/ML  SOLN
100.0000 mL | Freq: Once | INTRAMUSCULAR | Status: AC | PRN
  Administered 2023-02-08: 100 mL via INTRAVENOUS

## 2023-02-24 ENCOUNTER — Encounter: Payer: Self-pay | Admitting: Family Medicine

## 2023-03-13 ENCOUNTER — Ambulatory Visit: Payer: 59 | Admitting: Gastroenterology

## 2023-03-21 ENCOUNTER — Ambulatory Visit (INDEPENDENT_AMBULATORY_CARE_PROVIDER_SITE_OTHER): Payer: 59 | Admitting: Physician Assistant

## 2023-03-21 ENCOUNTER — Encounter: Payer: Self-pay | Admitting: Physician Assistant

## 2023-03-21 VITALS — BP 139/89 | Ht 66.0 in | Wt 153.2 lb

## 2023-03-21 DIAGNOSIS — Z9181 History of falling: Secondary | ICD-10-CM

## 2023-03-21 DIAGNOSIS — R053 Chronic cough: Secondary | ICD-10-CM

## 2023-03-21 DIAGNOSIS — G894 Chronic pain syndrome: Secondary | ICD-10-CM

## 2023-03-21 DIAGNOSIS — Z7689 Persons encountering health services in other specified circumstances: Secondary | ICD-10-CM

## 2023-03-21 DIAGNOSIS — R55 Syncope and collapse: Secondary | ICD-10-CM | POA: Diagnosis not present

## 2023-03-21 DIAGNOSIS — K5900 Constipation, unspecified: Secondary | ICD-10-CM | POA: Diagnosis not present

## 2023-03-21 DIAGNOSIS — G8929 Other chronic pain: Secondary | ICD-10-CM | POA: Insufficient documentation

## 2023-03-21 DIAGNOSIS — R112 Nausea with vomiting, unspecified: Secondary | ICD-10-CM

## 2023-03-21 DIAGNOSIS — F1010 Alcohol abuse, uncomplicated: Secondary | ICD-10-CM

## 2023-03-21 DIAGNOSIS — W19XXXA Unspecified fall, initial encounter: Secondary | ICD-10-CM

## 2023-03-21 MED ORDER — CELECOXIB 200 MG PO CAPS: 200.0000 mg | ORAL_CAPSULE | Freq: Two times a day (BID) | ORAL | 3 refills | Status: DC

## 2023-03-21 MED ORDER — ONDANSETRON HCL 4 MG PO TABS: 4.0000 mg | ORAL_TABLET | Freq: Three times a day (TID) | ORAL | 0 refills | Status: DC | PRN

## 2023-03-21 NOTE — Assessment & Plan Note (Signed)
 Educated patient on correct way to take Miralax by GI. Patient voices understanding. She was advised to follow up as scheduled with GI.

## 2023-03-21 NOTE — Patient Instructions (Signed)
 Increase water  intake Work on nutrition- balanced meal and/or protein shake Decrease alcohol intake if not eating  Celebrex  for pain, Tylenol  for intermittent use  Lab work today Zofran  for nausea Flonse and OTC cold meds okay for congestion  Follow up around June for yearly physical

## 2023-03-21 NOTE — Assessment & Plan Note (Signed)
 Encouraged patient to decreased weekly intake of alcohol, especially on days when she hasn't eaten. I suspect patient's syncope, dizziness, and falls are worsened by alcohol intake on an empty stomach.

## 2023-03-21 NOTE — Assessment & Plan Note (Signed)
 Discussed pain control options today. Patient declined referral to pain management as there is not one in town, and she does not wish to travel to [place] or [place]. Celebrex  given today, pending CMP, for pain control. Patient can take Tylenol  as needed for pain control as well.

## 2023-03-21 NOTE — Progress Notes (Signed)
 New Patient Office Visit  Subjective    Patient ID: Laura Mcgee, female    DOB: [birth year]  Age: 73 y.o. MRN: [id number]  CC:  Chief Complaint  Patient presents with   Establish Care    Would like referral to neurology for blackout spells x1 year    HPI Laura Mcgee presents to establish care  This is a 73 year old female presenting to establish care today with past medical history significant for hypertension, GERD, nausea and vomiting, tobacco and alcohol abuse, chronic pain, constipation, dizziness, and increased falls in the last year.  Patient voices concern for episodes of blackouts that have been happening for approximately 1 year but worsening over the last couple of months.  Patient states 1 episode of loss of consciousness, evaluated in the ER.  She states she is able to tell when these episodes are happening as she experiences shakiness, dizziness, lightheadedness, nausea, and headache.  She is unable to give an accurate frequency at which these episodes are occurring and is unable to attribute them to triggers or patterns.  She does states she has had increased falls from the symptoms.  Patient also voices decreased appetite along with nausea and vomiting.  She states on average she is eating maybe 1 meal a day.  She admits to chronic dehydration and does not get adequate daily water  intake.  Patient was not able to get protein shakes as advised by GI at last appointment.  Patient does admit to drinking about 2 beers per night.  Patient complaining of chronic pain today.  She states she has had chronic pain for years and is unable to pinpoint pain and states she experiences pain everywhere.  Patient states has been taking Goody powders and Tylenol  with no relief of symptoms.  In the past patient has managed pain with hydrocodone .  She voices increased pain since onset of blackouts and falls.   Patient also mentions cough with mucus production.  She states she was  recently treated for sinusitis with Augmentin  but denies fevers, nasal congestion, facial pain at this time.  Preventative care is not up-to-date at this time, we will schedule for yearly physical.  Outpatient Encounter Medications as of 03/21/2023  Medication Sig   celecoxib  (CELEBREX ) 200 MG capsule Take 1 capsule (200 mg total) by mouth 2 (two) times daily.   ondansetron  (ZOFRAN ) 4 MG tablet Take 1 tablet (4 mg total) by mouth every 8 (eight) hours as needed for nausea or vomiting.   amLODipine  (NORVASC ) 5 MG tablet Take 5 mg by mouth in the morning.   baclofen  (LIORESAL ) 10 MG tablet Take 10 mg by mouth daily as needed for muscle spasms (laryngospasm).   docusate sodium  (COLACE) 100 MG capsule Take 100 mg by mouth every other day. (Patient not taking: Reported on 01/16/2023)   HYDROcodone -acetaminophen  (NORCO/VICODIN) 5-325 MG tablet Take 1 tablet by mouth every 6 (six) hours as needed. (Patient not taking: Reported on 01/16/2023)   lidocaine  (XYLOCAINE ) 2 % jelly Apply 1 Application topically as needed. (Patient not taking: Reported on 01/16/2023)   lovastatin  (MEVACOR ) 20 MG tablet Take 20 mg by mouth in the morning.   olmesartan  (BENICAR ) 20 MG tablet Take 20 mg by mouth in the morning.   Propylene Glycol (SYSTANE COMPLETE OP) Place 1 drop into both eyes in the morning, at noon, and at bedtime.   venlafaxine  XR (EFFEXOR -XR) 150 MG 24 hr capsule Take 2 capsules (300 mg total) by mouth every evening. (Patient taking differently: Take 150  mg by mouth in the morning and at bedtime.)   No facility-administered encounter medications on file as of 03/21/2023.    Past Medical History:  Diagnosis Date   Arthritis    Atrial fibrillation (HCC)    Cancer (HCC)    Vaginal   COPD (chronic obstructive pulmonary disease) (HCC)    Depression    Difficult intubation    History of laryngospasm   Dysrhythmia    GERD (gastroesophageal reflux disease)    Hyperlipidemia    Hypertension    Numbness  and tingling    Pulmonary embolism (HCC) 1973    Past Surgical History:  Procedure Laterality Date   CERVIX LESION DESTRUCTION     throat   COLONOSCOPY WITH PROPOFOL  N/A 04/07/2015   SLF: 1. seven colorectal polyps removed 2. the left colon is redundant 3. small internal hemorrhoids (1 simple adenoma, 6 hyperplastic) . Repeat in 5 years.   CYST EXCISION Left    Arm   ESOPHAGOGASTRODUODENOSCOPY (EGD) WITH PROPOFOL  N/A 04/07/2015   SLF: 1. Schatzki ring 2. Bravo capsule 34 cm from the teeth 3. moderate non-erosive gastritis.    HARDWARE REMOVAL Right 12/05/2022   Procedure: RIGHT HUMERUS HARDWARE REMOVAL;  Surgeon: Onesimo Oneil LABOR, MD;  Location: [facility];  Service: Orthopedics;  Laterality: Right;  NEEDS RNFA   HUMERUS IM NAIL Right 06/25/2021   Procedure: INTRAMEDULLARY (IM) NAIL HUMERAL;  Surgeon: Onesimo Oneil LABOR, MD;  Location: [facility];  Service: Orthopedics;  Laterality: Right;   MASS EXCISION N/A 2002   Lanyx   MASS EXCISION Left 01/21/2015   Procedure: EXCISION CYST LEFT UPPER ARM;  Surgeon: Oneil Budge, MD;  Location: [facility];  Service: General;  Laterality: Left;   MICROLARYNGOSCOPY N/A 05/31/2016   Procedure: MICRO LARYNGOSCOPY WITH BIOPSY OF VOCAL CORD LESION;  Surgeon: Daniel Moccasin, MD;  Location: [facility];  Service: ENT;  Laterality: N/A;   POLYPECTOMY N/A 04/07/2015   Procedure: POLYPECTOMY;  Surgeon: Margo LITTIE Haddock, MD;  Location: [facility];  Service: Endoscopy;  Laterality: N/A;  Descending colon polyps x 3    SAVORY DILATION N/A 04/07/2015   Procedure: SAVORY DILATION;  Surgeon: Margo LITTIE Haddock, MD;  Location: [facility];  Service: Endoscopy;  Laterality: N/A;   VULVECTOMY N/A 07/12/2022   Procedure: SIMPLE PARTIAL VULVECTOMY, ADVANCEMENT FLAP;  Surgeon: Eldonna Mays, MD;  Location: [facility];  Service: Gynecology;  Laterality: N/A;    Family History  Problem Relation Age of Onset   Pneumonia Mother    Aneurysm Father    Kidney cancer Sister    Lung  cancer Sister    Esophageal cancer Brother    Melanoma Maternal Grandfather    Diabetes Daughter    Colon cancer Neg Hx    Breast cancer Neg Hx    Ovarian cancer Neg Hx    Endometrial cancer Neg Hx    Pancreatic cancer Neg Hx    Prostate cancer Neg Hx     Social History   Socioeconomic History   Marital status: Single    Spouse name: Not on file   Number of children: 1   Years of education: some college   Highest education level: Not on file  Occupational History   Occupation: Retired  Tobacco Use   Smoking status: Every Day    Current packs/day: 1.00    Average packs/day: 1 pack/day for 25.0 years (25.0 ttl pk-yrs)    Types: Cigarettes    Passive exposure: Current   Smokeless  tobacco: Never  Vaping Use   Vaping status: Never Used  Substance and Sexual Activity   Alcohol use: Yes    Comment: Drinks two beer per night.   Drug use: Not Currently    Types: Marijuana    Comment: daily marijuana use, not since 03/2022   Sexual activity: Not Currently    Birth control/protection: Post-menopausal  Other Topics Concern   Not on file  Social History Narrative   Lives at home alone.   Right-handed.   Two cups sweet tea daily.   Social Drivers of Corporate Investment Banker Strain: Low Risk  (11/30/2022)   Received from [facility]   Overall Financial Resource Strain (CARDIA)    Difficulty of Paying Living Expenses: Not hard at all  Food Insecurity: No Food Insecurity (11/30/2022)   [facility]   Hunger Vital Sign    Worried About Running Out of Food in the Last Year: Never true    Ran Out of Food in the Last Year: Never true  Transportation Needs: No Transportation Needs (11/30/2022)   Received from [facility] - Transportation    Lack of Transportation (Medical): No    Lack of Transportation (Non-Medical): No  Physical Activity: Sufficiently Active (11/30/2022)   Received from [facility]   Exercise Vital Sign    Days of  Exercise per Week: 5 days    Minutes of Exercise per Session: 30 min  Stress: No Stress Concern Present (11/30/2022)   Received from [facility] - Occupational Stress Questionnaire    Feeling of Stress : Not at all  Social Connections: Moderately Integrated (11/30/2022)   Received from [facility]   Social Connection and Isolation Panel [NHANES]    Frequency of Communication with Friends and Family: Three times a week    Frequency of Social Gatherings with Friends and Family: Three times a week    Attends Religious Services: 1 to 4 times per year    Active Member of Clubs or Organizations: No    Attends Banker Meetings: 1 to 4 times per year    Marital Status: Never married  Intimate Partner Violence: Not At Risk (11/30/2022)   Received from [facility]   Humiliation, Afraid, Rape, and Kick questionnaire    Fear of Current or Ex-Partner: No    Emotionally Abused: No    Physically Abused: No    Sexually Abused: No    Review of Systems  Constitutional:  Positive for weight loss. Negative for chills and fever.  HENT:  Negative for congestion, ear pain and sore throat.   Respiratory:  Positive for cough. Negative for shortness of breath and wheezing.   Cardiovascular:  Negative for chest pain, palpitations and leg swelling.  Gastrointestinal:  Positive for constipation, nausea and vomiting. Negative for abdominal pain.  Genitourinary:  Negative for dysuria.  Musculoskeletal:  Positive for back pain, falls and joint pain.  Neurological:  Positive for loss of consciousness (x1) and headaches.        Objective    BP 139/89   Ht 5' 6 (1.676 m)   Wt 153 lb 3.2 oz (69.5 kg)   LMP 06/20/1997 (Approximate) Comment: post menopausal  BMI 24.73 kg/m   Physical Exam Constitutional:      Appearance: Normal appearance.  HENT:     Head: Normocephalic.     Right Ear: Tympanic membrane normal.  Left Ear: Tympanic  membrane normal.     Nose: Nose normal.     Mouth/Throat:     Mouth: Mucous membranes are moist.     Pharynx: Oropharynx is clear.  Eyes:     Extraocular Movements: Extraocular movements intact.     Conjunctiva/sclera: Conjunctivae normal.  Neck:     Vascular: No carotid bruit.  Cardiovascular:     Rate and Rhythm: Normal rate and regular rhythm.     Pulses: Normal pulses.     Heart sounds: No murmur heard.    No gallop.  Pulmonary:     Effort: Pulmonary effort is normal.     Breath sounds: No wheezing, rhonchi or rales.  Musculoskeletal:     Right lower leg: No edema.     Left lower leg: No edema.  Skin:    General: Skin is warm and dry.  Neurological:     General: No focal deficit present.     Mental Status: She is alert and oriented to person, place, and time.     Cranial Nerves: No dysarthria or facial asymmetry.     Motor: No weakness.     Gait: Gait normal.  Psychiatric:        Mood and Affect: Mood normal.        Behavior: Behavior normal.       Assessment & Plan:  Encounter to establish care  Constipation, unspecified constipation type Assessment & Plan: Educated patient on correct way to take Miralax  by GI. Patient voices understanding. She was advised to follow up as scheduled with GI.    Vasovagal syncope Assessment & Plan: Likely related to decreased oral intake and decreased appetite. I encouraged patient to increase daily water  intake to ensure adequate hydration. Patient also encouraged to drink protein shake daily as advised by GI and attempt to eat 3 small meals daily. I advised patient to decrease daily alcohol intake, especially if she has not eaten that day. Patient encouraged to follow up as scheduled with GI for further work up of decreased appetite and nausea and vomiting.   Orders: -     CBC with Differential/Platelet -     CMP14+EGFR  Fall at home, initial encounter Assessment & Plan: Related to syncope and dizziness patient is  experiencing. Advised increased daily intake and balanced diet. Discussed fall preventions.    Alcohol abuse Assessment & Plan: Encouraged patient to decreased weekly intake of alcohol, especially on days when she hasn't eaten. I suspect patient's syncope, dizziness, and falls are worsened by alcohol intake on an empty stomach.   Orders: -     CMP14+EGFR  Chronic pain syndrome Assessment & Plan: Discussed pain control options today. Patient declined referral to pain management as there is not one in town, and she does not wish to travel to [place] or [place]. Celebrex  given today, pending CMP, for pain control. Patient can take Tylenol  as needed for pain control as well.   Orders: -     Celecoxib ; Take 1 capsule (200 mg total) by mouth 2 (two) times daily.  Dispense: 60 capsule; Refill: 3  Nausea and vomiting, unspecified vomiting type -     Ondansetron  HCl; Take 1 tablet (4 mg total) by mouth every 8 (eight) hours as needed for nausea or vomiting.  Dispense: 20 tablet; Refill: 0  Chronic cough Assessment & Plan: Likely related to cigarette smoking.  Patient advised that at this time antibiotic not necessary for symptoms.  She may use Flonase  OTC cold  medication for cough and drainage she is experiencing.  She can follow-up if symptoms are not improving over the next week or so.   Patient to follow-up around June for yearly wellness visit.  Will reevaluate syncope at this time.  Will touch on preventative health care and smoking cessation at this time.  Patient encouraged to follow-up with GI as needed and call our office to follow-up as needed for worsening symptoms.  Patient agreeable to states plan.  Return in about 5 months (around 08/19/2023).   Charmaine Kaela Beitz, PA-C

## 2023-03-21 NOTE — Assessment & Plan Note (Signed)
 Related to syncope and dizziness patient is experiencing. Advised increased daily intake and balanced diet. Discussed fall preventions.

## 2023-03-21 NOTE — Assessment & Plan Note (Signed)
 Likely related to decreased oral intake and decreased appetite. I encouraged patient to increase daily water  intake to ensure adequate hydration. Patient also encouraged to drink protein shake daily as advised by GI and attempt to eat 3 small meals daily. I advised patient to decrease daily alcohol intake, especially if she has not eaten that day. Patient encouraged to follow up as scheduled with GI for further work up of decreased appetite and nausea and vomiting.

## 2023-03-21 NOTE — Assessment & Plan Note (Signed)
 Likely related to cigarette smoking.  Patient advised that at this time antibiotic not necessary for symptoms.  She may use Flonase  OTC cold medication for cough and drainage she is experiencing.  She can follow-up if symptoms are not improving over the next week or so.

## 2023-03-22 LAB — CBC WITH DIFFERENTIAL/PLATELET
Basophils Absolute: 0.1 10*3/uL (ref 0.0–0.2)
Basos: 1 %
EOS (ABSOLUTE): 0.2 10*3/uL (ref 0.0–0.4)
Eos: 3 %
Hematocrit: 48.3 % — ABNORMAL HIGH (ref 34.0–46.6)
Hemoglobin: 15.8 g/dL (ref 11.1–15.9)
Immature Grans (Abs): 0.1 10*3/uL (ref 0.0–0.1)
Immature Granulocytes: 1 %
Lymphocytes Absolute: 2.9 10*3/uL (ref 0.7–3.1)
Lymphs: 33 %
MCH: 32.6 pg (ref 26.6–33.0)
MCHC: 32.7 g/dL (ref 31.5–35.7)
MCV: 100 fL — ABNORMAL HIGH (ref 79–97)
Monocytes Absolute: 0.9 10*3/uL (ref 0.1–0.9)
Monocytes: 10 %
Neutrophils Absolute: 4.6 10*3/uL (ref 1.4–7.0)
Neutrophils: 52 %
Platelets: 275 10*3/uL (ref 150–450)
RBC: 4.85 x10E6/uL (ref 3.77–5.28)
RDW: 12.6 % (ref 11.7–15.4)
WBC: 8.7 10*3/uL (ref 3.4–10.8)

## 2023-03-22 LAB — CMP14+EGFR
ALT: 20 [IU]/L (ref 0–32)
AST: 18 [IU]/L (ref 0–40)
Albumin: 3.9 g/dL (ref 3.8–4.8)
Alkaline Phosphatase: 121 [IU]/L (ref 44–121)
BUN/Creatinine Ratio: 18 (ref 12–28)
BUN: 14 mg/dL (ref 8–27)
Bilirubin Total: <0.2 mg/dL (ref 0.0–1.2)
CO2: 22 mmol/L (ref 20–29)
Calcium: 9.5 mg/dL (ref 8.7–10.3)
Chloride: 101 mmol/L (ref 96–106)
Creatinine, Ser: 0.78 mg/dL (ref 0.57–1.00)
Globulin, Total: 2.1 g/dL (ref 1.5–4.5)
Glucose: 83 mg/dL (ref 70–99)
Potassium: 4.3 mmol/L (ref 3.5–5.2)
Sodium: 138 mmol/L (ref 134–144)
Total Protein: 6 g/dL (ref 6.0–8.5)
eGFR: 80 mL/min/{1.73_m2} (ref >59)

## 2023-03-23 ENCOUNTER — Ambulatory Visit: Payer: 59 | Admitting: Obstetrics & Gynecology

## 2023-03-31 ENCOUNTER — Encounter: Payer: 59 | Admitting: Physician Assistant

## 2023-04-10 ENCOUNTER — Ambulatory Visit: Payer: 59 | Admitting: Nurse Practitioner

## 2023-04-12 ENCOUNTER — Ambulatory Visit: Payer: Self-pay | Admitting: Physician Assistant

## 2023-04-12 ENCOUNTER — Ambulatory Visit: Payer: 59 | Admitting: Obstetrics & Gynecology

## 2023-04-12 NOTE — Telephone Encounter (Signed)
 FYI

## 2023-04-12 NOTE — Telephone Encounter (Signed)
 Patient called in seeking to establish care specifically with Dr. Tobie at [facility]. Patient stated that she is not symptomatic at this time or seeking care for an acute issue. This RN scheduled a transfer of care appointment with Dr. Tobie on 06/08/23. This RN advised patient to call back if anything changes.   Copied from CRM [id number]. Topic: Clinical - Red Word Triage >> Apr 12, 2023  3:35 PM [name] wrote: Red Word that prompted transfer to Nurse Triage: Laura Mcgee blackout and fell 3 weeks ago and she hit her head. She did not go to ED or Urgent Care Reason for Disposition  Health Information question, no triage required and triager able to answer question  Protocols used: Information Only Call - No Triage-[facility]

## 2023-04-19 NOTE — Progress Notes (Deleted)
 GI Office Note    Referring Provider: No ref. provider found Primary Care Physician:  No primary care provider on file. Primary Gastroenterologist: Hennie Duos. Marletta Lor, DO  Date:  04/19/2023  ID:  Laura Mcgee, DOB May 12, 1950, MRN [id number]   Chief Complaint   No chief complaint on file.   History of Present Illness  Laura Mcgee is a 73 y.o. female with a history of *** presenting today with complaint of   EGD and colonoscopy January 2017. She had a Schatzki ring, Bravo capsule placed, moderate nonerosive gastritis. Bravo study showed nonacid reflux on PPI. 7 colorectal polyps removed, one was a simple adenoma the others hyperplastic. Left colon redundant. Small internal hemorrhoids. Next colonoscopy 5 years (2022).    Labs in November 2021 with elevated IgA, negative TTG IgA.  Normal thyroid function and negative lipase.   Last office visit 05/03/22.  Has urge use bathroom in the mornings has a commode for a while but then unable to go.  States this initially hard to push out stools and then her stools will be large.  Continues to have the urgency.  Denies eating during the day, admits to eating after 6 PM as this is a chronic habit.  When eating at night she has pain and nausea.  Sometimes when she does eat she has nausea and pain usually occurs in the right upper quadrant and is intermittent.  Declined colonoscopy.  Denied any melena or BRBPR.  Reported 10 pound weight loss within the prior 6 weeks and decreased appetite.  Offered Cologuard as she refused colonoscopy.  Advise she may benefit from a an upper endoscopy.  Check LFTs and order right upper quadrant ultrasound.  Advised Colace twice daily and MiraLAX nightly.   RUQ [place] 05/11/22: -Positive Murphy sign noted by sonographer -CBD measuring 4 mm -Fatty liver -Advised CT scan of the abdomen pelvis if she did not want to try pantoprazole or omeprazole.  Last office visit 01/16/23.***. Start famotidine 20 mg once daily. Miralax  nightly. GERD diet. Recommended protein shakes. CT A/P ordered. Advised will re discuss colonoscopy and EGD for evaluation of weight loss.   CT A/P 02/08/23 IMPRESSION: No evidence of biliary obstruction. Endometrial thickening or fluid within the endometrial canal, abnormal for postmenopausal state. Recommend pelvic ultrasound for further evaluation. 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 No suspicious renal mass, stones or hydronephrosis.  -Advised follow up with   Today:    Wt Readings from Last 3 Encounters:  03/21/23 153 lb 3.2 oz (69.5 kg)  01/16/23 152 lb 9.6 oz (69.2 kg)  01/11/23 152 lb (68.9 kg)    Current Outpatient Medications  Medication Sig Dispense Refill   amLODipine (NORVASC) 5 MG tablet Take 5 mg by mouth in the morning.     baclofen (LIORESAL) 10 MG tablet Take 10 mg by mouth daily as needed for muscle spasms (laryngospasm).     celecoxib (CELEBREX) 200 MG capsule Take 1 capsule (200 mg total) by mouth 2 (two) times daily. 60 capsule 3   docusate sodium (COLACE) 100 MG capsule Take 100 mg by mouth every other day. (Patient not taking: Reported on 01/16/2023)     HYDROcodone-acetaminophen (NORCO/VICODIN) 5-325 MG tablet Take 1 tablet by mouth every 6 (six) hours as needed. (Patient not taking: Reported on 01/16/2023) 30 tablet 0   lidocaine (XYLOCAINE) 2 % jelly Apply 1 Application topically as needed. (Patient not taking: Reported on 01/16/2023) 30 mL 3   lovastatin (MEVACOR) 20 MG  tablet Take 20 mg by mouth in the morning.     olmesartan (BENICAR) 20 MG tablet Take 20 mg by mouth in the morning.     ondansetron (ZOFRAN) 4 MG tablet Take 1 tablet (4 mg total) by mouth every 8 (eight) hours as needed for nausea or vomiting. 20 tablet 0   Propylene Glycol (SYSTANE COMPLETE OP) Place 1 drop into both eyes in the morning, at noon, and at bedtime.     venlafaxine XR (EFFEXOR-XR) 150 MG 24 hr capsule Take 2 capsules (300 mg total) by  mouth every evening. (Patient taking differently: Take 150 mg by mouth in the morning and at bedtime.) 60 capsule 0   No current facility-administered medications for this visit.    Past Medical History:  Diagnosis Date   Arthritis    Atrial fibrillation (HCC)    Cancer (HCC)    Vaginal   COPD (chronic obstructive pulmonary disease) (HCC)    Depression    Difficult intubation    History of laryngospasm   Dysrhythmia    GERD (gastroesophageal reflux disease)    Hyperlipidemia    Hypertension    Numbness and tingling    Pulmonary embolism (HCC) 1973    Past Surgical History:  Procedure Laterality Date   CERVIX LESION DESTRUCTION     throat   COLONOSCOPY WITH PROPOFOL N/A 04/07/2015   SLF: 1. seven colorectal polyps removed 2. the left colon is redundant 3. small internal hemorrhoids (1 simple adenoma, 6 hyperplastic) . Repeat in 5 years.   CYST EXCISION Left    Arm   ESOPHAGOGASTRODUODENOSCOPY (EGD) WITH PROPOFOL N/A 04/07/2015   SLF: 1. Schatzki ring 2. Bravo capsule 34 cm from the teeth 3. moderate non-erosive gastritis.    HARDWARE REMOVAL Right 12/05/2022   Procedure: RIGHT HUMERUS HARDWARE REMOVAL;  Surgeon: Oliver Barre, MD;  Location: [facility];  Service: Orthopedics;  Laterality: Right;  NEEDS RNFA   HUMERUS IM NAIL Right 06/25/2021   Procedure: INTRAMEDULLARY (IM) NAIL HUMERAL;  Surgeon: Oliver Barre, MD;  Location: [facility];  Service: Orthopedics;  Laterality: Right;   MASS EXCISION N/A 2002   Lanyx   MASS EXCISION Left 01/21/2015   Procedure: EXCISION CYST LEFT UPPER ARM;  Surgeon: Franky Macho, MD;  Location: [facility];  Service: General;  Laterality: Left;   MICROLARYNGOSCOPY N/A 05/31/2016   Procedure: MICRO LARYNGOSCOPY WITH BIOPSY OF VOCAL CORD LESION;  Surgeon: Newman Pies, MD;  Location: [facility];  Service: ENT;  Laterality: N/A;   POLYPECTOMY N/A 04/07/2015   Procedure: POLYPECTOMY;  Surgeon: West Bali, MD;  Location: [facility];  Service:  Endoscopy;  Laterality: N/A;  Descending colon polyps x 3    SAVORY DILATION N/A 04/07/2015   Procedure: SAVORY DILATION;  Surgeon: West Bali, MD;  Location: [facility];  Service: Endoscopy;  Laterality: N/A;   VULVECTOMY N/A 07/12/2022   Procedure: SIMPLE PARTIAL VULVECTOMY, ADVANCEMENT FLAP;  Surgeon: Clide Cliff, MD;  Location: [facility];  Service: Gynecology;  Laterality: N/A;    Family History  Problem Relation Age of Onset   Pneumonia Mother    Aneurysm Father    Kidney cancer Sister    Lung cancer Sister    Esophageal cancer Brother    Melanoma Maternal Grandfather    Diabetes Daughter    Colon cancer Neg Hx    Breast cancer Neg Hx    Ovarian cancer Neg Hx    Endometrial cancer Neg Hx  Pancreatic cancer Neg Hx    Prostate cancer Neg Hx     Allergies as of 04/20/2023 - Review Complete 03/21/2023  Allergen Reaction Noted   Gabapentin Other (See Comments) 05/22/2019   Tetracyclines & related Other (See Comments) 01/15/2015    Social History   Socioeconomic History   Marital status: Single    Spouse name: Not on file   Number of children: 1   Years of education: some college   Highest education level: Not on file  Occupational History   Occupation: Retired  Tobacco Use   Smoking status: Every Day    Current packs/day: 1.00    Average packs/day: 1 pack/day for 25.0 years (25.0 ttl pk-yrs)    Types: Cigarettes    Passive exposure: Current   Smokeless tobacco: Never  Vaping Use   Vaping status: Never Used  Substance and Sexual Activity   Alcohol use: Yes    Comment: Drinks two beer per night.   Drug use: Not Currently    Types: Marijuana    Comment: daily marijuana use, not since 03/2022   Sexual activity: Not Currently    Birth control/protection: Post-menopausal  Other Topics Concern   Not on file  Social History Narrative   Lives at home alone.   Right-handed.   Two cups sweet tea daily.   Social Drivers of Corporate investment banker  Strain: Low Risk  (11/30/2022)   [facility]   Overall Financial Resource Strain (CARDIA)    Difficulty of Paying Living Expenses: Not hard at all  Food Insecurity: No Food Insecurity (11/30/2022)   Received from [facility]   Hunger Vital Sign    Worried About Running Out of Food in the Last Year: Never true    Ran Out of Food in the Last Year: Never true  Transportation Needs: No Transportation Needs (11/30/2022)   Received from [facility] - Transportation    Lack of Transportation (Medical): No    Lack of Transportation (Non-Medical): No  Physical Activity: Sufficiently Active (11/30/2022)   Received from [facility]   Exercise Vital Sign    Days of Exercise per Week: 5 days    Minutes of Exercise per Session: 30 min  Stress: No Stress Concern Present (11/30/2022)   Received from [facility] Occupational [facility] - Occupational Stress Questionnaire    Feeling of Stress : Not at all  Social Connections: Moderately Integrated (11/30/2022)   Received from [facility]   Social Connection and Isolation Panel [NHANES]    Frequency of Communication with Friends and Family: Three times a week    Frequency of Social Gatherings with Friends and Family: Three times a week    Attends Religious Services: 1 to 4 times per year    Active Member of Clubs or Organizations: No    Attends Banker Meetings: 1 to 4 times per year    Marital Status: Never married     Review of Systems   Gen: Denies fever, chills, anorexia. Denies fatigue, weakness, weight loss.  CV: Denies chest pain, palpitations, syncope, peripheral edema, and claudication. Resp: Denies dyspnea at rest, cough, wheezing, coughing up blood, and pleurisy. GI: See HPI Derm: Denies rash, itching, dry skin Psych: Denies depression, anxiety, memory loss, confusion. No homicidal or suicidal ideation.  Heme: Denies bruising, bleeding, and enlarged lymph  nodes.  Physical Exam   LMP 06/20/1997 (Approximate) Comment: post  menopausal  General:   Alert and oriented. No distress noted. Pleasant and cooperative.  Head:  Normocephalic and atraumatic. Eyes:  Conjuctiva clear without scleral icterus. Mouth:  Oral mucosa pink and moist. Good dentition. No lesions. Lungs:  Clear to auscultation bilaterally. No wheezes, rales, or rhonchi. No distress.  Heart:  S1, S2 present without murmurs appreciated.  Abdomen:  +BS, soft, non-tender and non-distended. No rebound or guarding. No HSM or masses noted. Rectal: *** Msk:  Symmetrical without gross deformities. Normal posture. Extremities:  Without edema. Neurologic:  Alert and  oriented x4 Psych:  Alert and cooperative. Normal mood and affect.  Assessment  Laura Mcgee is a 74 y.o. female with a history of *** presenting today with    PLAN   ***     Brooke Bonito, MSN, FNP-BC, AGACNP-BC [facility]

## 2023-04-20 ENCOUNTER — Ambulatory Visit: Payer: 59 | Admitting: Gastroenterology

## 2023-04-26 ENCOUNTER — Ambulatory Visit: Payer: 59 | Admitting: Orthopedic Surgery

## 2023-05-02 ENCOUNTER — Ambulatory Visit: Payer: 59 | Admitting: Orthopedic Surgery

## 2023-05-09 ENCOUNTER — Ambulatory Visit: Payer: 59 | Admitting: Orthopedic Surgery

## 2023-05-09 DIAGNOSIS — M19011 Primary osteoarthritis, right shoulder: Secondary | ICD-10-CM

## 2023-05-10 ENCOUNTER — Encounter: Payer: Self-pay | Admitting: Orthopedic Surgery

## 2023-05-10 NOTE — Progress Notes (Signed)
 Orthopaedic Postop Note  Assessment: Laura Mcgee is a 73 y.o. female s/p IM nail of right humerus shaft fracture; hardware removal  DOS: 06/25/2021  Advanced right shoulder glenohumeral joint arthritis  Plan: Laura Mcgee continues to have right shoulder pain.  She has advanced degenerative changes.  Prior injection not effective.  She could benefit from reverse shoulder arthroplasty.  Procedure discussed.  All questions answered.  She will need to obtain medical clearance.  This may require additional consultation.  She will need to stop smoking.  Once cleared, will obtain a CT scan.    Follow-up: After clearance  XR at next visit: R shoulder  Subjective:  Chief Complaint  Patient presents with   Shoulder Pain    Pt fell on arm 1 mo ago. Here to discuss possible shoulder replacement.     History of Present Illness: Laura Mcgee is a 73 y.o. female who returns following the above stated procedure. She has recovered from removal of hardware.  Continues to have pain.  Limited motion and function.  Prior injection with limited  improvement.  She is interested in discussing surgery.    review of Systems: No fevers or chills No numbness or tingling No Chest Pain No shortness of breath   Objective: LMP 06/20/1997 (Approximate) Comment: post menopausal  Physical Exam:  Alert and oriented.  No acute distress.  Surgical incisions are healed. No surrounding erythema or drainage.  Forward flexion limited to 90 degrees.  Abduction to 90 degrees.  Unsteady gait, no assistive device.    IMAGING: I personally ordered and reviewed the following images:  No new imaging obtained today.  Oliver Barre, MD 05/10/2023 10:28 PM

## 2023-05-11 ENCOUNTER — Ambulatory Visit: Payer: 59 | Admitting: Gastroenterology

## 2023-05-11 ENCOUNTER — Encounter: Payer: Self-pay | Admitting: Gastroenterology

## 2023-05-22 ENCOUNTER — Ambulatory Visit: Payer: 59 | Admitting: Psychiatry

## 2023-05-29 ENCOUNTER — Inpatient Hospital Stay: Payer: 59 | Attending: Psychiatry | Admitting: Psychiatry

## 2023-05-29 VITALS — BP 142/96 | HR 86 | Temp 98.4°F | Resp 20 | Wt 152.0 lb

## 2023-05-29 DIAGNOSIS — R9389 Abnormal findings on diagnostic imaging of other specified body structures: Secondary | ICD-10-CM | POA: Diagnosis not present

## 2023-05-29 DIAGNOSIS — Z9079 Acquired absence of other genital organ(s): Secondary | ICD-10-CM | POA: Diagnosis not present

## 2023-05-29 DIAGNOSIS — Z8544 Personal history of malignant neoplasm of other female genital organs: Secondary | ICD-10-CM | POA: Insufficient documentation

## 2023-05-29 DIAGNOSIS — G8929 Other chronic pain: Secondary | ICD-10-CM | POA: Insufficient documentation

## 2023-05-29 DIAGNOSIS — C519 Malignant neoplasm of vulva, unspecified: Secondary | ICD-10-CM

## 2023-05-29 NOTE — Progress Notes (Unsigned)
 Gynecologic Oncology Return Clinic Visit  Date of Service: 05/29/2023 Referring Provider: Cyril Mourning, NP [name], DO  Assessment & Plan: Laura Mcgee is a 73 y.o. 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  Vulvar cancer: - Well healed. NED on exam today. - Given only 1mm DOI, negative margins, okay to proceed with close surveillance.  - Signs/symptoms of recurrence reviewed. - Recommend surveillance q80mo for the first 2 years.  Chronic pain: - Has PCP appt early April to establish care. - Previously sent Rx to bridge her in September. She requested another bridge but do not feel comfortable with additional prescription given that I had already bridged her once.   Endometrial thickening: - Reviewed further evaluation with pelvic ultrasound, possible endometrial biopsy -  Pelvic ultrasound ordered - If thickening confirm, will plan for endometrial biopsy  RTC following completion of pelvic ultrasound  Clide Cliff, MD Gynecologic Oncology   Medical Decision Making I personally spent  TOTAL 30 minutes face-to-face and non-face-to-face in the care of this patient, which includes all pre, intra, and post visit time on the date of service.    ----------------------- Reason for Visit: Follow-up  Treatment History: Oncology History  Vulvar cancer (HCC)  07/12/2022 Surgery   Simple partial vulvectomy, advancement flap    07/12/2022 Initial Diagnosis   Vulvar cancer (HCC)   07/12/2022 Pathology Results   FINAL MICROSCOPIC DIAGNOSIS:   A. VULVA, POSTERIOR, PARTIAL VULVECTOMY:  - Superficially invasive squamous cell carcinoma arising in high grade  squamous intraepithelial lesion, HPV associated (see Comment)  - Margins not involved by high grade dysplasia or carcinoma   COMMENT:  - Morphologic evaluation reveals multifocal areas of superficial  invasion up to 0.1 cm  depth of invasion. P53 shows wild type staining.  P16 is strong positive in the lesion. This case was reviewed with Dr. Reynolds Bowl  who agrees with the diagnosis.    07/12/2022 Cancer Staging   Staging form: Vulva, AJCC V9 - Pathologic stage from 07/12/2022: FIGO Stage IA (pT1a, pNX, cM0) - Signed by Clide Cliff, MD on 08/10/2022 Histopathologic type: Squamous cell carcinoma, NOS Stage prefix: Initial diagnosis     Interval History: Pt reports that her vulva is doing well. No bleeding, itching or new vulvar lesions. Does note urge urinary incontinence the last 1-2 months. She also reports that her GI put her on miralax to help with Bms. Reports decreased appetite and weight loss in the past year. But weight has been stable in the past 6 months (154lbs to 152lbs).   She otherwise notes that she is scheduled to see a new PCP 06/08/23, Dr. Allena Katz with [facility].   Does report she had a CT scan done on 02/16/23 for evaluation for possible biliary obstruction. It incidentally showed endometrial thickening. Pt denies and postmenopausal bleeding or spotting.  Past Medical/Surgical History: Past Medical History:  Diagnosis Date   Arthritis    Atrial fibrillation (HCC)    Cancer (HCC)    Vaginal   COPD (chronic obstructive pulmonary disease) (HCC)    Depression    Difficult intubation    History of laryngospasm   Dysrhythmia    GERD (gastroesophageal reflux disease)    Hyperlipidemia    Hypertension    Numbness and tingling    Pulmonary embolism (HCC) 1973    Past Surgical History:  Procedure Laterality Date   CERVIX LESION DESTRUCTION     throat  COLONOSCOPY WITH PROPOFOL N/A 04/07/2015   SLF: 1. seven colorectal polyps removed 2. the left colon is redundant 3. small internal hemorrhoids (1 simple adenoma, 6 hyperplastic) . Repeat in 5 years.   CYST EXCISION Left    Arm   ESOPHAGOGASTRODUODENOSCOPY (EGD) WITH PROPOFOL N/A 04/07/2015   SLF: 1. Schatzki ring 2. Bravo capsule  34 cm from the teeth 3. moderate non-erosive gastritis.    HARDWARE REMOVAL Right 12/05/2022   Procedure: RIGHT HUMERUS HARDWARE REMOVAL;  Surgeon: Oliver Barre, MD;  Location: [facility];  Service: Orthopedics;  Laterality: Right;  NEEDS RNFA   HUMERUS IM NAIL Right 06/25/2021   Procedure: INTRAMEDULLARY (IM) NAIL HUMERAL;  Surgeon: Oliver Barre, MD;  Location: [facility];  Service: Orthopedics;  Laterality: Right;   MASS EXCISION N/A 2002   Lanyx   MASS EXCISION Left 01/21/2015   Procedure: EXCISION CYST LEFT UPPER ARM;  Surgeon: Franky Macho, MD;  Location: [facility];  Service: General;  Laterality: Left;   MICROLARYNGOSCOPY N/A 05/31/2016   Procedure: MICRO LARYNGOSCOPY WITH BIOPSY OF VOCAL CORD LESION;  Surgeon: Newman Pies, MD;  Location: [facility];  Service: ENT;  Laterality: N/A;   POLYPECTOMY N/A 04/07/2015   Procedure: POLYPECTOMY;  Surgeon: West Bali, MD;  Location: [facility];  Service: Endoscopy;  Laterality: N/A;  Descending colon polyps x 3    SAVORY DILATION N/A 04/07/2015   Procedure: SAVORY DILATION;  Surgeon: West Bali, MD;  Location: [facility];  Service: Endoscopy;  Laterality: N/A;   VULVECTOMY N/A 07/12/2022   Procedure: SIMPLE PARTIAL VULVECTOMY, ADVANCEMENT FLAP;  Surgeon: Clide Cliff, MD;  Location: [facility];  Service: Gynecology;  Laterality: N/A;    Family History  Problem Relation Age of Onset   Pneumonia Mother    Aneurysm Father    Kidney cancer Sister    Lung cancer Sister    Esophageal cancer Brother    Melanoma Maternal Grandfather    Diabetes Daughter    Colon cancer Neg Hx    Breast cancer Neg Hx    Ovarian cancer Neg Hx    Endometrial cancer Neg Hx    Pancreatic cancer Neg Hx    Prostate cancer Neg Hx     Social History   Socioeconomic History   Marital status: Single    Spouse name: Not on file   Number of children: 1   Years of education: some college   Highest education level: Not on file  Occupational History    Occupation: Retired  Tobacco Use   Smoking status: Every Day    Current packs/day: 1.00    Average packs/day: 1 pack/day for 25.0 years (25.0 ttl pk-yrs)    Types: Cigarettes    Passive exposure: Current   Smokeless tobacco: Never  Vaping Use   Vaping status: Never Used  Substance and Sexual Activity   Alcohol use: Yes    Comment: Drinks two beer per night.   Drug use: Not Currently    Types: Marijuana    Comment: daily marijuana use, not since 03/2022   Sexual activity: Not Currently    Birth control/protection: Post-menopausal  Other Topics Concern   Not on file  Social History Narrative   Lives at home alone.   Right-handed.   Two cups sweet tea daily.   Social Drivers of Corporate investment banker Strain: Low Risk  (11/30/2022)   Received [facility]   Overall Financial Resource Strain (CARDIA)  Difficulty of Paying Living Expenses: Not hard at all  Food Insecurity: No Food Insecurity (11/30/2022)   Received from [facility]   Hunger Vital Sign    Worried About Running Out of Food in the Last Year: Never true    Ran Out of Food in the Last Year: Never true  Transportation Needs: No Transportation Needs (11/30/2022)   Received from [facility] - Transportation    Lack of Transportation (Medical): No    Lack of Transportation (Non-Medical): No  Physical Activity: Sufficiently Active (11/30/2022)   Received from [facility]   Exercise Vital Sign    Days of Exercise per Week: 5 days    Minutes of Exercise per Session: 30 min  Stress: No Stress Concern Present (11/30/2022)   Received from [facility] - Occupational Stress Questionnaire    Feeling of Stress : Not at all  Social Connections: Moderately Integrated (11/30/2022)   Received from [facility]   Social Connection and Isolation Panel [NHANES]    Frequency of Communication with Friends and Family: Three times a week    Frequency  of Social Gatherings with Friends and Family: Three times a week    Attends Religious Services: 1 to 4 times per year    Active Member of Clubs or Organizations: No    Attends Banker Meetings: 1 to 4 times per year    Marital Status: Never married    Current Medications:  Current Outpatient Medications:    amLODipine (NORVASC) 5 MG tablet, Take 5 mg by mouth in the morning., Disp: , Rfl:    baclofen (LIORESAL) 10 MG tablet, Take 10 mg by mouth daily as needed for muscle spasms (laryngospasm)., Disp: , Rfl:    celecoxib (CELEBREX) 200 MG capsule, Take 1 capsule (200 mg total) by mouth 2 (two) times daily., Disp: 60 capsule, Rfl: 3   lidocaine (XYLOCAINE) 2 % jelly, Apply 1 Application topically as needed. (Patient not taking: Reported on 01/16/2023), Disp: 30 mL, Rfl: 3   lovastatin (MEVACOR) 20 MG tablet, Take 20 mg by mouth in the morning., Disp: , Rfl:    olmesartan (BENICAR) 20 MG tablet, Take 20 mg by mouth in the morning., Disp: , Rfl:    ondansetron (ZOFRAN) 4 MG tablet, Take 1 tablet (4 mg total) by mouth every 8 (eight) hours as needed for nausea or vomiting., Disp: 20 tablet, Rfl: 0   Propylene Glycol (SYSTANE COMPLETE OP), Place 1 drop into both eyes in the morning, at noon, and at bedtime., Disp: , Rfl:    venlafaxine XR (EFFEXOR-XR) 150 MG 24 hr capsule, Take 2 capsules (300 mg total) by mouth every evening. (Patient taking differently: Take 150 mg by mouth in the morning and at bedtime.), Disp: 60 capsule, Rfl: 0  Review of Symptoms: Complete 10-system review is positive for: Appetite change, joint pain, headache, blackouts, falling, back pain, fatigue, weight loss, muscle pain, problem walking  Physical Exam: BP (!) 142/96 (BP Location: Left Arm, Patient Position: Sitting) Comment: MD notfiied  Pulse 86   Temp 98.4 F (36.9 C) (Oral)   Resp 20   Wt 152 lb (68.9 kg)   LMP 06/20/1997 (Approximate) Comment: post menopausal  SpO2 98%   BMI 24.53 kg/m   General: Alert, oriented, no acute distress. HEENT: Normocephalic, atraumatic. Neck symmetric without masses. Sclera anicteric.  Chest: Normal work of breathing. CTAB CV: RRR Abdomen: Soft, nontender.  Extremities: Grossly normal range of motion.  Warm, well perfused.  No edema bilaterally. Skin: No rashes or lesions noted. Lymphatics: No cervical, supraclavicular, inguinal adenopathy. GU: External genitalia with evidence of posterior vulvectomy of the perineal body, completely healed. No lesions, erythema. Speculum exam reveals vagina and cervix without lesion.  Bimanual exam reveals normal cervix and uterus.   Exam chaperoned by Kimberly Swazil[place], CMA  Laboratory & Radiologic Studies: CT ABDOMEN PELVIS W CONTRAST 02/08/2023  Narrative CLINICAL DATA:  Biliary obstruction suspected (Ped 0-17y) Weight loss, unintended Lack of appetite, right upper quadrant pain  EXAM: CT ABDOMEN AND PELVIS WITH CONTRAST  TECHNIQUE: Multidetector CT imaging of the abdomen and pelvis was performed using the standard protocol following bolus administration of intravenous contrast.  RADIATION DOSE REDUCTION: This exam was performed according to the departmental dose-optimization program which includes automated exposure control, adjustment of the mA and/or kV according to patient size and/or use of iterative reconstruction technique.  CONTRAST:  OMNIPAQUE IOHEXOL 300 MG/ML  SOLN  COMPARISON:  07/09/2018  FINDINGS: Lower chest: Scarring in the lung bases. No acute abnormality. Calcifications in the visualized right coronary artery and aorta.  Hepatobiliary: No focal hepatic abnormality. Gallbladder unremarkable. No biliary ductal dilatation.  Pancreas: No focal abnormality or ductal dilatation.  Spleen: No focal abnormality.  Normal size.  Adrenals/Urinary Tract: Bilateral low-density adrenal masses again noted compatible with adenomas. No suspicious renal mass, stones  or hydronephrosis. Areas of cortical thinning and scarring in the kidneys bilaterally. Urinary bladder unremarkable.  Stomach/Bowel: Stomach, large and small bowel grossly unremarkable.  Vascular/Lymphatic: Aortoiliac atherosclerosis. No evidence of aneurysm or adenopathy.  Reproductive: Fluid or thickening in the endometrial canal measuring up to 1.4 cm in thickness. No adnexal mass.  Other: No free fluid or free air.  Musculoskeletal: No acute bony abnormality. Degenerative disc and facet disease.  IMPRESSION: No evidence of biliary obstruction.  Endometrial thickening or fluid within the endometrial canal, abnormal for postmenopausal state. Recommend pelvic ultrasound for further evaluation.  Aortic atherosclerosis.   Electronically Signed By: Charlett Nose M.D. On: 02/16/2023 03:08

## 2023-05-31 ENCOUNTER — Encounter: Payer: Self-pay | Admitting: Psychiatry

## 2023-06-02 ENCOUNTER — Ambulatory Visit (HOSPITAL_COMMUNITY)
Admission: RE | Admit: 2023-06-02 | Discharge: 2023-06-02 | Disposition: A | Discharge status: Home / Self Care | Source: Ambulatory Visit | Attending: Psychiatry | Admitting: Psychiatry

## 2023-06-02 DIAGNOSIS — R9389 Abnormal findings on diagnostic imaging of other specified body structures: Secondary | ICD-10-CM | POA: Diagnosis present

## 2023-06-08 ENCOUNTER — Ambulatory Visit (INDEPENDENT_AMBULATORY_CARE_PROVIDER_SITE_OTHER): Payer: Self-pay | Admitting: Internal Medicine

## 2023-06-08 ENCOUNTER — Encounter: Payer: Self-pay | Admitting: Internal Medicine

## 2023-06-08 ENCOUNTER — Other Ambulatory Visit (HOSPITAL_COMMUNITY): Payer: Self-pay

## 2023-06-08 ENCOUNTER — Telehealth: Payer: Self-pay | Admitting: Pharmacy Technician

## 2023-06-08 VITALS — BP 128/72 | HR 99 | Ht 66.0 in | Wt 152.6 lb

## 2023-06-08 DIAGNOSIS — M48062 Spinal stenosis, lumbar region with neurogenic claudication: Secondary | ICD-10-CM

## 2023-06-08 DIAGNOSIS — I10 Essential (primary) hypertension: Secondary | ICD-10-CM | POA: Diagnosis not present

## 2023-06-08 DIAGNOSIS — R55 Syncope and collapse: Secondary | ICD-10-CM | POA: Diagnosis not present

## 2023-06-08 DIAGNOSIS — I480 Paroxysmal atrial fibrillation: Secondary | ICD-10-CM

## 2023-06-08 DIAGNOSIS — Z8709 Personal history of other diseases of the respiratory system: Secondary | ICD-10-CM | POA: Insufficient documentation

## 2023-06-08 DIAGNOSIS — Z1159 Encounter for screening for other viral diseases: Secondary | ICD-10-CM | POA: Diagnosis not present

## 2023-06-08 DIAGNOSIS — Z780 Asymptomatic menopausal state: Secondary | ICD-10-CM

## 2023-06-08 DIAGNOSIS — E782 Mixed hyperlipidemia: Secondary | ICD-10-CM

## 2023-06-08 DIAGNOSIS — C519 Malignant neoplasm of vulva, unspecified: Secondary | ICD-10-CM

## 2023-06-08 DIAGNOSIS — J0111 Acute recurrent frontal sinusitis: Secondary | ICD-10-CM | POA: Diagnosis not present

## 2023-06-08 DIAGNOSIS — Z1231 Encounter for screening mammogram for malignant neoplasm of breast: Secondary | ICD-10-CM

## 2023-06-08 DIAGNOSIS — F17210 Nicotine dependence, cigarettes, uncomplicated: Secondary | ICD-10-CM | POA: Diagnosis not present

## 2023-06-08 DIAGNOSIS — Z720 Tobacco use: Secondary | ICD-10-CM

## 2023-06-08 DIAGNOSIS — G894 Chronic pain syndrome: Secondary | ICD-10-CM

## 2023-06-08 DIAGNOSIS — F1010 Alcohol abuse, uncomplicated: Secondary | ICD-10-CM

## 2023-06-08 DIAGNOSIS — M19011 Primary osteoarthritis, right shoulder: Secondary | ICD-10-CM

## 2023-06-08 MED ORDER — BACLOFEN 10 MG PO TABS
10.0000 mg | ORAL_TABLET | Freq: Every day | ORAL | Status: AC | PRN
Start: 2023-06-08 — End: ?

## 2023-06-08 MED ORDER — AMLODIPINE BESYLATE 5 MG PO TABS
5.0000 mg | ORAL_TABLET | Freq: Every morning | ORAL | 1 refills | Status: DC
Start: 2023-06-08 — End: 2023-07-19

## 2023-06-08 MED ORDER — HYDROCODONE-ACETAMINOPHEN 5-325 MG PO TABS
1.0000 | ORAL_TABLET | Freq: Two times a day (BID) | ORAL | 0 refills | Status: DC | PRN
Start: 2023-06-08 — End: 2023-06-23

## 2023-06-08 MED ORDER — LOVASTATIN 20 MG PO TABS
20.0000 mg | ORAL_TABLET | Freq: Every morning | ORAL | 1 refills | Status: AC
Start: 2023-06-08 — End: ?

## 2023-06-08 MED ORDER — AMOXICILLIN-POT CLAVULANATE 875-125 MG PO TABS
1.0000 | ORAL_TABLET | Freq: Two times a day (BID) | ORAL | 0 refills | Status: DC
Start: 2023-06-08 — End: 2023-06-21

## 2023-06-08 MED ORDER — OLMESARTAN MEDOXOMIL 20 MG PO TABS
20.0000 mg | ORAL_TABLET | Freq: Every morning | ORAL | 1 refills | Status: DC
Start: 2023-06-08 — End: 2023-07-19

## 2023-06-08 NOTE — Telephone Encounter (Signed)
 Pharmacy Patient Advocate Encounter   Received notification from Patient Pharmacy that prior authorization for HYDROcodone-Acetaminophen 5-325MG  tablets is required/requested.Insurance verification completed.   The patient is insured through [place] .   Per test claim: INSURANCE ALLOWS A MAX OF A 7 DAY SUPPLY AT THIS TIME. PATIENT IS CONSIDERED OPIOID NAIVE AND IS LIMITED TO A 7 DAY SUPPLY. RAN TEST CLAIM AND HER PHARMACY DISPENSED HER 14 TABLETS/7 DAYS TODAY.

## 2023-06-08 NOTE — Patient Instructions (Addendum)
 Please schedule Bone Density and Mammogram  Please start taking Augmentin as prescribed for acute sinusitis.  Please cut down -> quit smoking. Please cut down alcohol use and increase water intake. Please have at least 3 meals in a day.  Please continue to take other medications as prescribed.

## 2023-06-08 NOTE — Assessment & Plan Note (Signed)
 Has baclofen as needed, has been evaluated by GI

## 2023-06-08 NOTE — Assessment & Plan Note (Signed)
 Started empiric Augmentin as she has persistent symptoms with despite symptomatic treatment Continue Flonase for nasal congestion/allergies

## 2023-06-08 NOTE — Assessment & Plan Note (Signed)
 Has celecoxib as needed for pain Norco p.o. as needed for severe pain Followed by orthopedic surgery - needs Cardiac clearance for shoulder arthroplasty

## 2023-06-08 NOTE — Assessment & Plan Note (Addendum)
 Considering her history of A-fib, she needs cardiology follow-up due to recent episodes of syncope Not on rate controlling agent or AC currently AC was previously avoided due to use of alcohol - risk of falls

## 2023-06-08 NOTE — Assessment & Plan Note (Signed)
 Previous MRI of lumbar spine reviewed Tylenol as needed for mild to moderate pain Restart Norco at a lower dose only for severe pain

## 2023-06-08 NOTE — Assessment & Plan Note (Signed)
 BP Readings from Last 1 Encounters:  06/08/23 128/72   Well-controlled with amlodipine 5 mg QD and olmesartan 20 mg QD Needs to improve hydration to avoid orthostatic hypotension from olmesartan Counseled for compliance with the medications Advised DASH diet and moderate exercise/walking as tolerated

## 2023-06-08 NOTE — Progress Notes (Signed)
 New Patient Office Visit  Subjective:  Patient ID: Laura Mcgee, female    DOB: 11-19-1950  Age: 73 y.o. MRN: [id number]  CC:  Chief Complaint  Patient presents with   Care Management    Needs to establish care, prefers a MD. Pt reports having black outs last time it happened was a month ago. Has loss of appetite.     HPI Laura Mcgee is a 73 y.o. female with past medical history of HTN, paroxysmal A-fib, GERD, dysphagia, vulvar cancer, chronic pain syndrome, HLD and tobacco abuse who presents for establishing care.  Postural dizziness: She reports episodes of syncope, last episode about a month ago.  She reports lightheadedness with position change.  Her orthostatic vitals were positive today.  She has a history of what p.o. intake of fluids, has only 1 meal in a day and reports taking 2 beers every day.  HTN: Her BP was WNL today.  Orthostatic vitals were positive.  She takes amlodipine 5 mg QD and olmesartan 20 mg QD currently.  Denies headache, chest pain or palpitations currently.  Paroxysmal A-fib: She was diagnosed with A-fib in 2023. She admitted to [facility] in 06/2021 with right humeral fracture and new onset of atrial fibrillation with RVR. She was placed on amiodarone drip during hospitalization but currently she is not on any rate controlling agents. She was also not placed on systemic anticoagulation due to active alcohol abuse. She needs follow up with Cardiology.  Chronic pain syndrome: She has history of lumbar spinal stenosis, cervical DDD and chronic right shoulder pain.  She used to take Norco 10/325 mg up to TID PRN for chronic pain when she saw Dr Sudie Bailey. She has not been able to get it regularly for the last 6 months. She has worsening of back and shoulder pain.  Acute sinusitis: She reports nasal congestion, sinus pressure related headache and postnasal drip for the last 2 weeks.  Denies any fever or chills.  Denies dyspnea or wheezing currently.  She  has been using Flonase without much relief.  She takes Effexor 150 mg QD for hot flashes.   Past Medical History:  Diagnosis Date   Arthritis    Atrial fibrillation (HCC)    Cancer (HCC)    Vaginal   COPD (chronic obstructive pulmonary disease) (HCC)    Depression    Difficult intubation    History of laryngospasm   Dysrhythmia    GERD (gastroesophageal reflux disease)    Hyperlipidemia    Hypertension    Numbness and tingling    Pulmonary embolism (HCC) 1973    Past Surgical History:  Procedure Laterality Date   CERVIX LESION DESTRUCTION     throat   COLONOSCOPY WITH PROPOFOL N/A 04/07/2015   SLF: 1. seven colorectal polyps removed 2. the left colon is redundant 3. small internal hemorrhoids (1 simple adenoma, 6 hyperplastic) . Repeat in 5 years.   CYST EXCISION Left    Arm   ESOPHAGOGASTRODUODENOSCOPY (EGD) WITH PROPOFOL N/A 04/07/2015   SLF: 1. Schatzki ring 2. Bravo capsule 34 cm from the teeth 3. moderate non-erosive gastritis.    HARDWARE REMOVAL Right 12/05/2022   Procedure: RIGHT HUMERUS HARDWARE REMOVAL;  Surgeon: Oliver Barre, MD;  Location: [facility];  Service: Orthopedics;  Laterality: Right;  NEEDS RNFA   HUMERUS IM NAIL Right 06/25/2021   Procedure: INTRAMEDULLARY (IM) NAIL HUMERAL;  Surgeon: Oliver Barre, MD;  Location: [facility];  Service: Orthopedics;  Laterality: Right;   MASS  EXCISION N/A 2002   Lanyx   MASS EXCISION Left 01/21/2015   Procedure: EXCISION CYST LEFT UPPER ARM;  Surgeon: Franky Macho, MD;  Location: [facility];  Service: General;  Laterality: Left;   MICROLARYNGOSCOPY N/A 05/31/2016   Procedure: MICRO LARYNGOSCOPY WITH BIOPSY OF VOCAL CORD LESION;  Surgeon: Newman Pies, MD;  Location: [facility];  Service: ENT;  Laterality: N/A;   POLYPECTOMY N/A 04/07/2015   Procedure: POLYPECTOMY;  Surgeon: West Bali, MD;  Location: [facility];  Service: Endoscopy;  Laterality: N/A;  Descending colon polyps x 3    SAVORY DILATION N/A  04/07/2015   Procedure: SAVORY DILATION;  Surgeon: West Bali, MD;  Location: [facility];  Service: Endoscopy;  Laterality: N/A;   VULVECTOMY N/A 07/12/2022   Procedure: SIMPLE PARTIAL VULVECTOMY, ADVANCEMENT FLAP;  Surgeon: Clide Cliff, MD;  Location: [facility];  Service: Gynecology;  Laterality: N/A;    Family History  Problem Relation Age of Onset   Pneumonia Mother    Aneurysm Father    Kidney cancer Sister    Lung cancer Sister    Esophageal cancer Brother    Melanoma Maternal Grandfather    Diabetes Daughter    Colon cancer Neg Hx    Breast cancer Neg Hx    Ovarian cancer Neg Hx    Endometrial cancer Neg Hx    Pancreatic cancer Neg Hx    Prostate cancer Neg Hx     Social History   Socioeconomic History   Marital status: Single    Spouse name: Not on file   Number of children: 1   Years of education: some college   Highest education level: Not on file  Occupational History   Occupation: Retired  Tobacco Use   Smoking status: Every Day    Current packs/day: 1.00    Average packs/day: 1 pack/day for 25.0 years (25.0 ttl pk-yrs)    Types: Cigarettes    Passive exposure: Current   Smokeless tobacco: Never   Tobacco comments:    1 ppd as of 06/08/23  Vaping Use   Vaping status: Never Used  Substance and Sexual Activity   Alcohol use: Yes    Comment: Drinks two beer per night.   Drug use: Not Currently    Types: Marijuana    Comment: daily marijuana use, not since 03/2022   Sexual activity: Not Currently    Birth control/protection: Post-menopausal  Other Topics Concern   Not on file  Social History Narrative   Lives at home alone.   Right-handed.   Two cups sweet tea daily.   Social Drivers of Corporate investment banker Strain: Low Risk  (11/30/2022)   Received from [facility]   Overall Financial Resource Strain (CARDIA)    Difficulty of Paying Living Expenses: Not hard at all  Food Insecurity: No Food Insecurity (11/30/2022)   Received from  [facility]   Hunger Vital Sign    Worried About Running Out of Food in the Last Year: Never true    Ran Out of Food in the Last Year: Never true  Transportation Needs: No Transportation Needs (11/30/2022)   Received from [facility] - Transportation    Lack of Transportation (Medical): No    Lack of Transportation (Non-Medical): No  Physical Activity: Sufficiently Active (11/30/2022)   Received from [facility]   Exercise Vital Sign    Days of Exercise per Week: 5 days  Minutes of Exercise per Session: 30 min  Stress: No Stress Concern Present (11/30/2022)   Received from [facility] - Occupational Stress Questionnaire    Feeling of Stress : Not at all  Social Connections: Moderately Integrated (11/30/2022)   Received from [facility]   Social Connection and Isolation Panel [NHANES]    Frequency of Communication with Friends and Family: Three times a week    Frequency of Social Gatherings with Friends and Family: Three times a week    Attends Religious Services: 1 to 4 times per year    Active Member of Clubs or Organizations: No    Attends Banker Meetings: 1 to 4 times per year    Marital Status: Never married  Intimate Partner Violence: Not At Risk (11/30/2022)   Received from [facility]   Humiliation, Afraid, Rape, and Kick questionnaire    Fear of Current or Ex-Partner: No    Emotionally Abused: No    Physically Abused: No    Sexually Abused: No    ROS Review of Systems  Constitutional:  Positive for fatigue. Negative for chills and fever.  HENT:  Positive for congestion, postnasal drip, sinus pain and sore throat.   Eyes:  Negative for pain and discharge.  Respiratory:  Positive for cough. Negative for shortness of breath.   Cardiovascular:  Negative for chest pain and palpitations.  Gastrointestinal:  Negative for abdominal pain, diarrhea, nausea and vomiting.  Endocrine:  Negative for polydipsia and polyuria.  Genitourinary:  Negative for dysuria and hematuria.  Musculoskeletal:  Positive for arthralgias and back pain. Negative for neck pain and neck stiffness.  Skin:  Negative for rash.  Neurological:  Positive for dizziness. Negative for weakness.  Psychiatric/Behavioral:  Negative for agitation and behavioral problems.     Objective:   Today's Vitals: BP 128/72 (BP Location: Left Arm)   Pulse 99   Ht 5\' 6"  (1.676 m)   Wt 152 lb 9.6 oz (69.2 kg)   LMP 06/20/1997 (Approximate) Comment: post menopausal  SpO2 95%   BMI 24.63 kg/m   Physical Exam Vitals reviewed.  Constitutional:      General: She is not in acute distress.    Appearance: She is not diaphoretic.  HENT:     Head: Normocephalic and atraumatic.     Nose: Congestion present.     Right Sinus: Frontal sinus tenderness present.     Left Sinus: Frontal sinus tenderness present.     Mouth/Throat:     Mouth: Mucous membranes are dry.  Eyes:     General: No scleral icterus.    Extraocular Movements: Extraocular movements intact.  Cardiovascular:     Rate and Rhythm: Normal rate and regular rhythm.     Heart sounds: Normal heart sounds. No murmur heard. Pulmonary:     Breath sounds: Normal breath sounds. No wheezing or rales.  Musculoskeletal:     Right shoulder: No swelling. Decreased range of motion.     Cervical back: Neck supple. No tenderness.     Lumbar back: Tenderness present. Decreased range of motion.     Right lower leg: No edema.     Left lower leg: No edema.  Skin:    General: Skin is warm.     Findings: No rash.  Neurological:     General: No focal deficit present.     Mental Status: She is alert and oriented to person, place, and time.  Psychiatric:  Mood and Affect: Mood normal.        Behavior: Behavior normal.     Assessment & Plan:   Problem List Items Addressed This Visit       Cardiovascular and Mediastinum   Essential hypertension - Primary  (Chronic)   BP Readings from Last 1 Encounters:  06/08/23 128/72   Well-controlled with amlodipine 5 mg QD and olmesartan 20 mg QD Needs to improve hydration to avoid orthostatic hypotension from olmesartan Counseled for compliance with the medications Advised DASH diet and moderate exercise/walking as tolerated       Relevant Medications   amLODipine (NORVASC) 5 MG tablet   olmesartan (BENICAR) 20 MG tablet   lovastatin (MEVACOR) 20 MG tablet   Other Relevant Orders   CBC with Differential/Platelet   CMP14+EGFR   TSH + free T4   Urinalysis   Paroxysmal A-fib (HCC) (Chronic)   Considering her history of A-fib, she needs cardiology follow-up due to recent episodes of syncope Not on rate controlling agent or AC currently AC was previously avoided due to use of alcohol - risk of falls      Relevant Medications   amLODipine (NORVASC) 5 MG tablet   olmesartan (BENICAR) 20 MG tablet   lovastatin (MEVACOR) 20 MG tablet     Respiratory   Acute recurrent frontal sinusitis   Started empiric Augmentin as she has persistent symptoms with despite symptomatic treatment Continue Flonase for nasal congestion/allergies      Relevant Medications   amoxicillin-clavulanate (AUGMENTIN) 875-125 MG tablet     Musculoskeletal and Integument   Primary osteoarthritis of right shoulder   Has celecoxib as needed for pain Norco p.o. as needed for severe pain Followed by orthopedic surgery - needs Cardiac clearance for shoulder arthroplasty      Relevant Medications   baclofen (LIORESAL) 10 MG tablet   HYDROcodone-acetaminophen (NORCO/VICODIN) 5-325 MG tablet     Genitourinary   Vulvar cancer (HCC)   Followed by OB/GYN      Relevant Medications   amoxicillin-clavulanate (AUGMENTIN) 875-125 MG tablet     Other   Alcohol abuse (Chronic)   Takes 2 beers daily Postural dizziness could be due to dehydration from alcohol abuse Needs to cut down -> quit alcohol use due to her current  medical conditions      Tobacco abuse (Chronic)   Smokes about 0.5 pack/day  Asked about quitting: confirms that he/she currently smokes cigarettes Advise to quit smoking: Educated about QUITTING to reduce the risk of cancer, cardio and cerebrovascular disease. Assess willingness: Unwilling to quit at this time, but is working on cutting back. Assist with counseling and pharmacotherapy: Counseled for 5 minutes. Arrange for follow up: follow up in 3 months and continue to offer help.      Lumbar spinal stenosis   Previous MRI of lumbar spine reviewed Tylenol as needed for mild to moderate pain Restart Norco at a lower dose only for severe pain      Relevant Medications   HYDROcodone-acetaminophen (NORCO/VICODIN) 5-325 MG tablet   Syncope   Likely related to dehydration from poor p.o. intake of fluids, alcohol abuse and/or A Fib Needs cardiology follow-up Needs to improve p.o. intake of fluids and eat at regular intervals, avoid alcohol use Check CBC, CMP, TSH      Mixed hyperlipidemia   Relevant Medications   amLODipine (NORVASC) 5 MG tablet   olmesartan (BENICAR) 20 MG tablet   lovastatin (MEVACOR) 20 MG tablet   Other Relevant Orders  Lipid Profile   Chronic pain   Likely due to chronic right shoulder pain, lumbar spinal stenosis and cervical DDD Restart Norco at a lower dose only for severe pain - advised to avoid alcohol use while taking Norco      Relevant Medications   baclofen (LIORESAL) 10 MG tablet   HYDROcodone-acetaminophen (NORCO/VICODIN) 5-325 MG tablet   Post-menopausal   Takes Effexor 150 mg QD for hot flashes      Relevant Orders   DG Bone Density   History of laryngeal spasm   Has baclofen as needed, has been evaluated by GI      Relevant Medications   baclofen (LIORESAL) 10 MG tablet   Other Visit Diagnoses       Breast cancer screening by mammogram       Relevant Orders   MM 3D SCREENING MAMMOGRAM BILATERAL BREAST     Need for hepatitis  C screening test       Relevant Orders   Hepatitis C Antibody       Outpatient Encounter Medications as of 06/08/2023  Medication Sig   amoxicillin-clavulanate (AUGMENTIN) 875-125 MG tablet Take 1 tablet by mouth 2 (two) times daily.   celecoxib (CELEBREX) 200 MG capsule Take 1 capsule (200 mg total) by mouth 2 (two) times daily.   HYDROcodone-acetaminophen (NORCO/VICODIN) 5-325 MG tablet Take 1 tablet by mouth every 12 (twelve) hours as needed for moderate pain (pain score 4-6) or severe pain (pain score 7-10).   lidocaine (XYLOCAINE) 2 % jelly Apply 1 Application topically as needed.   Propylene Glycol (SYSTANE COMPLETE OP) Place 1 drop into both eyes in the morning, at noon, and at bedtime.   venlafaxine XR (EFFEXOR-XR) 150 MG 24 hr capsule Take 2 capsules (300 mg total) by mouth every evening. (Patient taking differently: Take 150 mg by mouth in the morning and at bedtime.)   [DISCONTINUED] amLODipine (NORVASC) 5 MG tablet Take 5 mg by mouth in the morning.   [DISCONTINUED] baclofen (LIORESAL) 10 MG tablet Take 10 mg by mouth daily as needed for muscle spasms (laryngospasm).   [DISCONTINUED] lovastatin (MEVACOR) 20 MG tablet Take 20 mg by mouth in the morning.   [DISCONTINUED] olmesartan (BENICAR) 20 MG tablet Take 20 mg by mouth in the morning.   [DISCONTINUED] ondansetron (ZOFRAN) 4 MG tablet Take 1 tablet (4 mg total) by mouth every 8 (eight) hours as needed for nausea or vomiting.   amLODipine (NORVASC) 5 MG tablet Take 1 tablet (5 mg total) by mouth in the morning.   baclofen (LIORESAL) 10 MG tablet Take 1 tablet (10 mg total) by mouth daily as needed for muscle spasms (laryngospasm).   lovastatin (MEVACOR) 20 MG tablet Take 1 tablet (20 mg total) by mouth in the morning.   olmesartan (BENICAR) 20 MG tablet Take 1 tablet (20 mg total) by mouth in the morning.   No facility-administered encounter medications on file as of 06/08/2023.    Follow-up: Return in about 3 months (around  09/07/2023) for HTN and weight loss.   Anabel Halon, MD

## 2023-06-08 NOTE — Assessment & Plan Note (Signed)
 Likely due to chronic right shoulder pain, lumbar spinal stenosis and cervical DDD Restart Norco at a lower dose only for severe pain - advised to avoid alcohol use while taking Norco

## 2023-06-08 NOTE — Assessment & Plan Note (Signed)
 Smokes about 0.5 pack/day  Asked about quitting: confirms that he/she currently smokes cigarettes Advise to quit smoking: Educated about QUITTING to reduce the risk of cancer, cardio and cerebrovascular disease. Assess willingness: Unwilling to quit at this time, but is working on cutting back. Assist with counseling and pharmacotherapy: Counseled for 5 minutes. Arrange for follow up: follow up in 3 months and continue to offer help.

## 2023-06-08 NOTE — Assessment & Plan Note (Signed)
 Takes 2 beers daily Postural dizziness could be due to dehydration from alcohol abuse Needs to cut down -> quit alcohol use due to her current medical conditions

## 2023-06-08 NOTE — Assessment & Plan Note (Signed)
Followed by OB/GYN 

## 2023-06-08 NOTE — Assessment & Plan Note (Signed)
 Likely related to dehydration from poor p.o. intake of fluids, alcohol abuse and/or A Fib Needs cardiology follow-up Needs to improve p.o. intake of fluids and eat at regular intervals, avoid alcohol use Check CBC, CMP, TSH

## 2023-06-08 NOTE — Assessment & Plan Note (Signed)
 Takes Effexor 150 mg QD for hot flashes

## 2023-06-09 ENCOUNTER — Encounter: Payer: Self-pay | Admitting: Internal Medicine

## 2023-06-09 LAB — CMP14+EGFR
ALT: 15 IU/L (ref 0–32)
AST: 18 IU/L (ref 0–40)
Albumin: 4 g/dL (ref 3.8–4.8)
Alkaline Phosphatase: 119 IU/L (ref 44–121)
BUN/Creatinine Ratio: 19 (ref 12–28)
BUN: 15 mg/dL (ref 8–27)
Bilirubin Total: 0.2 mg/dL (ref 0.0–1.2)
CO2: 21 mmol/L (ref 20–29)
Calcium: 9.7 mg/dL (ref 8.7–10.3)
Chloride: 102 mmol/L (ref 96–106)
Creatinine, Ser: 0.81 mg/dL (ref 0.57–1.00)
Globulin, Total: 2.6 g/dL (ref 1.5–4.5)
Glucose: 82 mg/dL (ref 70–99)
Potassium: 4.7 mmol/L (ref 3.5–5.2)
Sodium: 137 mmol/L (ref 134–144)
Total Protein: 6.6 g/dL (ref 6.0–8.5)
eGFR: 77 mL/min/{1.73_m2} (ref >59)

## 2023-06-09 LAB — LIPID PANEL
Chol/HDL Ratio: 2.1 ratio (ref 0.0–4.4)
Cholesterol, Total: 170 mg/dL (ref 100–199)
HDL: 82 mg/dL (ref >39)
LDL Chol Calc (NIH): 76 mg/dL (ref 0–99)
Triglycerides: 63 mg/dL (ref 0–149)
VLDL Cholesterol Cal: 12 mg/dL (ref 5–40)

## 2023-06-09 LAB — TSH+FREE T4
Free T4: 1.15 ng/dL (ref 0.82–1.77)
TSH: 0.555 u[IU]/mL (ref 0.450–4.500)

## 2023-06-09 LAB — CBC WITH DIFFERENTIAL/PLATELET
Basophils Absolute: 0.1 10*3/uL (ref 0.0–0.2)
Basos: 1 %
EOS (ABSOLUTE): 0.2 10*3/uL (ref 0.0–0.4)
Eos: 3 %
Hematocrit: 45.7 % (ref 34.0–46.6)
Hemoglobin: 15.5 g/dL (ref 11.1–15.9)
Immature Grans (Abs): 0 10*3/uL (ref 0.0–0.1)
Immature Granulocytes: 1 %
Lymphocytes Absolute: 2.8 10*3/uL (ref 0.7–3.1)
Lymphs: 35 %
MCH: 33 pg (ref 26.6–33.0)
MCHC: 33.9 g/dL (ref 31.5–35.7)
MCV: 97 fL (ref 79–97)
Monocytes Absolute: 0.8 10*3/uL (ref 0.1–0.9)
Monocytes: 10 %
Neutrophils Absolute: 4.2 10*3/uL (ref 1.4–7.0)
Neutrophils: 50 %
Platelets: 269 10*3/uL (ref 150–450)
RBC: 4.69 x10E6/uL (ref 3.77–5.28)
RDW: 13.6 % (ref 11.7–15.4)
WBC: 8.1 10*3/uL (ref 3.4–10.8)

## 2023-06-09 LAB — URINALYSIS
Bilirubin, UA: NEGATIVE
Glucose, UA: NEGATIVE
Ketones, UA: NEGATIVE
Leukocytes,UA: NEGATIVE
Nitrite, UA: NEGATIVE
RBC, UA: NEGATIVE
Specific Gravity, UA: 1.024 (ref 1.005–1.030)
Urobilinogen, Ur: 0.2 mg/dL (ref 0.2–1.0)
pH, UA: 5.5 (ref 5.0–7.5)

## 2023-06-09 LAB — HEPATITIS C ANTIBODY: Hep C Virus Ab: NONREACTIVE

## 2023-06-12 ENCOUNTER — Telehealth: Payer: Self-pay | Admitting: Pharmacy Technician

## 2023-06-12 ENCOUNTER — Other Ambulatory Visit (HOSPITAL_COMMUNITY): Payer: Self-pay

## 2023-06-12 NOTE — Telephone Encounter (Signed)
 Pharmacy Patient Advocate Encounter  Received notification from [facility] that Prior Authorization for HYDROcodone-Acetaminophen 5-325MG  tablets has been CANCELLED due to insurance requires a claim history of the 7 day supply before pt can be authorized to fill any additional fills for more than a 7 day supply. Pt filled 7 days on 06/08/2023. Any future fills should process at the pharmacy.   PA #/Case ID/Reference #: [id number]

## 2023-06-12 NOTE — Telephone Encounter (Signed)
 PA request has been Started. New Encounter has been or will be created for follow up. For additional info see Pharmacy Prior Auth telephone encounter from 06/12/2023.

## 2023-06-12 NOTE — Telephone Encounter (Signed)
 Pharmacy Patient Advocate Encounter   Received notification from Pt Calls Messages that prior authorization for HYDROcodone-Acetaminophen 5-325MG  tablets is required/requested.   Insurance verification completed.   The patient is insured through [employer] .   Per test claim: PA required; PA submitted to above mentioned insurance via CoverMyMeds Key/confirmation #/EOC [facility] Status is pending

## 2023-06-19 ENCOUNTER — Encounter: Payer: Self-pay | Admitting: Psychiatry

## 2023-06-21 ENCOUNTER — Encounter: Payer: Self-pay | Admitting: Psychiatry

## 2023-06-22 ENCOUNTER — Telehealth: Payer: Self-pay

## 2023-06-22 NOTE — Telephone Encounter (Signed)
 Copied from CRM [id number]. Topic: General - Other >> Jun 22, 2023 11:37 AM Emylou G wrote: Reason for CRM: patient called HYDROcodone-acetaminophen (NORCO/VICODIN) 5-325 MG tablet wants refill but says 5mg  is not enough.. pain is more severe - would like to take twice a day.. Also would like neurologist and ENT referral .. Pls call patient [phone]

## 2023-06-23 ENCOUNTER — Other Ambulatory Visit: Payer: Self-pay | Admitting: Internal Medicine

## 2023-06-23 DIAGNOSIS — M48062 Spinal stenosis, lumbar region with neurogenic claudication: Secondary | ICD-10-CM

## 2023-06-23 DIAGNOSIS — G894 Chronic pain syndrome: Secondary | ICD-10-CM

## 2023-06-23 MED ORDER — HYDROCODONE-ACETAMINOPHEN 5-325 MG PO TABS: 1.0000 | ORAL_TABLET | Freq: Two times a day (BID) | ORAL | 0 refills | Status: DC | PRN

## 2023-06-26 ENCOUNTER — Inpatient Hospital Stay: Attending: Psychiatry | Admitting: Psychiatry

## 2023-06-26 VITALS — BP 131/81 | HR 96 | Temp 98.1°F | Resp 17 | Ht 66.0 in | Wt 151.0 lb

## 2023-06-26 DIAGNOSIS — R9389 Abnormal findings on diagnostic imaging of other specified body structures: Secondary | ICD-10-CM | POA: Insufficient documentation

## 2023-06-26 DIAGNOSIS — Z9079 Acquired absence of other genital organ(s): Secondary | ICD-10-CM | POA: Diagnosis not present

## 2023-06-26 DIAGNOSIS — C519 Malignant neoplasm of vulva, unspecified: Secondary | ICD-10-CM

## 2023-06-26 DIAGNOSIS — Z8544 Personal history of malignant neoplasm of other female genital organs: Secondary | ICD-10-CM | POA: Diagnosis not present

## 2023-06-26 DIAGNOSIS — G8929 Other chronic pain: Secondary | ICD-10-CM | POA: Insufficient documentation

## 2023-06-26 NOTE — Patient Instructions (Signed)
 Today you had an endometrial biopsy, which is a sampling from the lining of the uterus. You may have light spotting from this.   You will be contacted with the results of the biopsy and recommendations moving forward.

## 2023-06-26 NOTE — Progress Notes (Signed)
 Gynecologic Oncology Return Clinic Visit  Date of Service: 06/26/2023 Referring Provider: Lendia Quay, NP Keene Pastures, DO  Assessment & Plan: Laura Mcgee is a 73 y.o. 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 CT in December to r/o biliary duct obstruction with incidental finding of endometrial thickening, recent pelvic ultrasound confirms with stripe of 10mm. Here for follow-up of this and sampling.   Endometrial thickening: -Recommend sampling getting endometrial thickening confirmed on ultrasound. -Endometrial biopsy obtained today.  See procedure note below. - Will follow-up results and discuss next steps as relevant.  Vulvar cancer: - Well healed. NED on exam today. - Given only 1mm DOI, negative margins, okay to proceed with close surveillance.  - Signs/symptoms of recurrence reviewed. - Recommend surveillance q57mo for the first 2 years.  Chronic pain: - Has established with new PCP in [place].  For treatment to PCP.  RTC 68mo as previously scheduled, unless indicated sooner by biopsy results  Derrel Flies, MD Gynecologic Oncology   Medical Decision Making I personally spent  TOTAL 20 minutes face-to-face and non-face-to-face in the care of this patient, which includes all pre, intra, and post visit time on the date of service.    ----------------------- Reason for Visit: Follow-up  Treatment History: Oncology History  Vulvar cancer (HCC)  07/12/2022 Surgery   Simple partial vulvectomy, advancement flap    07/12/2022 Initial Diagnosis   Vulvar cancer (HCC)   07/12/2022 Pathology Results   FINAL MICROSCOPIC DIAGNOSIS:   A. VULVA, POSTERIOR, PARTIAL VULVECTOMY:  - Superficially invasive squamous cell carcinoma arising in high grade  squamous intraepithelial lesion, HPV associated (see Comment)  - Margins not involved by high grade dysplasia or carcinoma   COMMENT:  - Morphologic evaluation  reveals multifocal areas of superficial  invasion up to 0.1 cm depth of invasion. P53 shows wild type staining.  P16 is strong positive in the lesion. This case was reviewed with Dr. Talmadge Fail  who agrees with the diagnosis.    07/12/2022 Cancer Staging   Staging form: Vulva, AJCC V9 - Pathologic stage from 07/12/2022: FIGO Stage IA (pT1a, pNX, cM0) - Signed by Derrel Flies, MD on 08/10/2022 Histopathologic type: Squamous cell carcinoma, NOS Stage prefix: Initial diagnosis     Interval History: Patient reports that she is doing well.  No changes since last visit on 05/29/2023.  Patient denies postmenopausal bleeding or spotting.  Was able to get established with new PCP in [place].   Past Medical/Surgical History: Past Medical History:  Diagnosis Date   Arthritis    Atrial fibrillation (HCC)    Cancer (HCC)    Vaginal   COPD (chronic obstructive pulmonary disease) (HCC)    Depression    Difficult intubation    History of laryngospasm   Dysrhythmia    GERD (gastroesophageal reflux disease)    Hyperlipidemia    Hypertension    Numbness and tingling    Pulmonary embolism (HCC) 1973    Past Surgical History:  Procedure Laterality Date   CERVIX LESION DESTRUCTION     throat   COLONOSCOPY WITH PROPOFOL  N/A 04/07/2015   SLF: 1. seven colorectal polyps removed 2. the left colon is redundant 3. small internal hemorrhoids (1 simple adenoma, 6 hyperplastic) . Repeat in 5 years.   CYST EXCISION Left    Arm   ESOPHAGOGASTRODUODENOSCOPY (EGD) WITH PROPOFOL  N/A 04/07/2015   SLF: 1. Schatzki ring 2. Bravo capsule 34 cm from the teeth 3. moderate non-erosive gastritis.  HARDWARE REMOVAL Right 12/05/2022   Procedure: RIGHT HUMERUS HARDWARE REMOVAL;  Surgeon: Tonita Frater, MD;  [facility]: [facility];  Service: Orthopedics;  Laterality: Right;  NEEDS RNFA   HUMERUS IM NAIL Right 06/25/2021   Procedure: INTRAMEDULLARY (IM) NAIL HUMERAL;  Surgeon: Tonita Frater, MD;  Location: [facility];  Service:  Orthopedics;  Laterality: Right;   MASS EXCISION N/A 2002   Lanyx   MASS EXCISION Left 01/21/2015   Procedure: EXCISION CYST LEFT UPPER ARM;  Surgeon: Alanda Allegra, MD;  Location: [facility];  Service: General;  Laterality: Left;   MICROLARYNGOSCOPY N/A 05/31/2016   Procedure: MICRO LARYNGOSCOPY WITH BIOPSY OF VOCAL CORD LESION;  Surgeon: Reynold Caves, MD;  Location: [facility];  Service: ENT;  Laterality: N/A;   POLYPECTOMY N/A 04/07/2015   Procedure: POLYPECTOMY;  Surgeon: Alyce Jubilee, MD;  [facility];  Service: Endoscopy;  Laterality: N/A;  Descending colon polyps x 3    SAVORY DILATION N/A 04/07/2015   Procedure: SAVORY DILATION;  Surgeon: Alyce Jubilee, MD;  [facility];  Service: Endoscopy;  Laterality: N/A;   VULVECTOMY N/A 07/12/2022   Procedure: SIMPLE PARTIAL VULVECTOMY, ADVANCEMENT FLAP;  Surgeon: Derrel Flies, MD;  Location: [facility];  Service: Gynecology;  Laterality: N/A;    Family History  Problem Relation Age of Onset   Pneumonia Mother    Aneurysm Father    Kidney cancer Sister    Lung cancer Sister    Esophageal cancer Brother    Melanoma Maternal Grandfather    Diabetes Daughter    Colon cancer Neg Hx    Breast cancer Neg Hx    Ovarian cancer Neg Hx    Endometrial cancer Neg Hx    Pancreatic cancer Neg Hx    Prostate cancer Neg Hx     Social History   Socioeconomic History   Marital status: Single    Spouse name: Not on file   Number of children: 1   Years of education: some college   Highest education level: Not on file  Occupational History   Occupation: Retired  Tobacco Use   Smoking status: Every Day    Current packs/day: 1.00    Average packs/day: 1 pack/day for 25.0 years (25.0 ttl pk-yrs)    Types: Cigarettes    Passive exposure: Current   Smokeless tobacco: Never   Tobacco comments:    1 ppd as of 06/08/23  Vaping Use   Vaping status: Never Used  Substance and Sexual Activity   Alcohol use: Yes     Comment: Drinks two beer per night.   Drug use: Not Currently    Types: Marijuana    Comment: daily marijuana use, not since 03/2022   Sexual activity: Not Currently    Birth control/protection: Post-menopausal  Other Topics Concern   Not on file  Social History Narrative   Lives at home alone.   Right-handed.   Two cups sweet tea daily.   Social Drivers of Corporate investment banker Strain: Low Risk  (11/30/2022)   [facility]   Overall Financial Resource Strain (CARDIA)    Difficulty of Paying Living Expenses: Not hard at all  Food Insecurity: No Food Insecurity (11/30/2022)   [facility]   Hunger Vital Sign    Worried About Running Out of Food in the Last Year: Never true    Ran Out of Food in the Last Year: Never true  Transportation  Needs: No Transportation Needs (11/30/2022)   Received from [facility] - Transportation    Lack of Transportation (Medical): No    Lack of Transportation (Non-Medical): No  Physical Activity: Sufficiently Active (11/30/2022)   Received from [facility]   Exercise Vital Sign    Days of Exercise per Week: 5 days    Minutes of Exercise per Session: 30 min  Stress: No Stress Concern Present (11/30/2022)   Received from [facility] - Occupational Stress Questionnaire    Feeling of Stress : Not at all  Social Connections: Moderately Integrated (11/30/2022)   Received from [facility]   Social Connection and Isolation Panel [NHANES]    Frequency of Communication with Friends and Family: Three times a week    Frequency of Social Gatherings with Friends and Family: Three times a week    Attends Religious Services: 1 to 4 times per year    Active Member of Clubs or Organizations: No    Attends Banker Meetings: 1 to 4 times per year    Marital Status: Never married    Current Medications:  Current Outpatient Medications:     amLODipine  (NORVASC ) 5 MG tablet, Take 1 tablet (5 mg total) by mouth in the morning., Disp: 90 tablet, Rfl: 1   baclofen  (LIORESAL ) 10 MG tablet, Take 1 tablet (10 mg total) by mouth daily as needed for muscle spasms (laryngospasm)., Disp: , Rfl:    celecoxib  (CELEBREX ) 200 MG capsule, Take 1 capsule (200 mg total) by mouth 2 (two) times daily., Disp: 60 capsule, Rfl: 3   lidocaine  (XYLOCAINE ) 2 % jelly, Apply 1 Application topically as needed., Disp: 30 mL, Rfl: 3   lovastatin  (MEVACOR ) 20 MG tablet, Take 1 tablet (20 mg total) by mouth in the morning., Disp: 90 tablet, Rfl: 1   olmesartan  (BENICAR ) 20 MG tablet, Take 1 tablet (20 mg total) by mouth in the morning., Disp: 90 tablet, Rfl: 1   Propylene Glycol (SYSTANE COMPLETE OP), Place 1 drop into both eyes in the morning, at noon, and at bedtime., Disp: , Rfl:    venlafaxine  XR (EFFEXOR -XR) 150 MG 24 hr capsule, Take 2 capsules (300 mg total) by mouth every evening. (Patient taking differently: Take 150 mg by mouth in the morning and at bedtime.), Disp: 60 capsule, Rfl: 0   HYDROcodone -acetaminophen  (NORCO/VICODIN) 5-325 MG tablet, Take 1 tablet by mouth every 12 (twelve) hours as needed for moderate pain (pain score 4-6) or severe pain (pain score 7-10)., Disp: 60 tablet, Rfl: 0  Review of Symptoms: Complete 10-system review is positive for: No appetite, headache, bruising, back pain, fatigue, abdominal pain, dizziness, weight loss, cough, night sweats, difficulty walking  Physical Exam: BP 131/81 (BP Location: Left Arm, Patient Position: Sitting)   Pulse 96   Temp 98.1 F (36.7 C) (Oral)   Resp 17   Ht 5\' 6"  (1.676 m)   Wt 151 lb (68.5 kg)   LMP 06/20/1997 (Approximate) Comment: post menopausal  SpO2 97%   BMI 24.37 kg/m  General: Alert, oriented, no acute distress. HEENT: Normocephalic, atraumatic. Neck symmetric without masses. Sclera anicteric.  Chest: Normal work of breathing. CTAB CV: RRR Abdomen: Soft, nontender.   Extremities: Grossly normal range of motion.  Warm, well perfused.  No edema bilaterally. Skin: No rashes or lesions noted. Lymphatics: No cervical, supraclavicular, inguinal adenopathy. GU: External genitalia with evidence of posterior vulvectomy of the perineal body, completely  healed. No lesions, erythema. Speculum exam reveals vagina and cervix without lesion.  Bimanual exam reveals normal cervix and anteverted mobile uterus.   Exam chaperoned by Vira Grieves, NP  EMB Procedure: After appropriate verbal informed consent was obtained, a timeout was performed. A sterile speculum was placed in the vagina, and the area was cleaned with betadine x3. A single-tooth tenaculum was placed on the anterior lip of the cervix.  An endometrial biopsy pipelle was advanced carefully to the uterine fundus which sounded to 7cm. A predominantly fluid sample was obtained over 2 passes. The tenaculum was removed, and tenaculum sites were noted to be hemostatic. The speculum was removed from the vagina. The patient tolerated the procedure well.   UPT: not indicated   Laboratory & Radiologic Studies: US  PELVIC COMPLETE WITH TRANSVAGINAL 06/02/2023  Narrative CLINICAL DATA:  Abnormal CT scan.  Assess for thickened endometrium.  EXAM: TRANSABDOMINAL AND TRANSVAGINAL ULTRASOUND OF PELVIS  TECHNIQUE: Both transabdominal and transvaginal ultrasound examinations of the pelvis were performed. Transabdominal technique was performed for global imaging of the pelvis including uterus, ovaries, adnexal regions, and pelvic cul-de-sac. It was necessary to proceed with endovaginal exam following the transabdominal exam to visualize the endometrium.  COMPARISON:  CT abdomen pelvis February 08, 2023  FINDINGS: Uterus  Measurements: 6.6 x 3.2 x 5.3 cm = volume: 58.78 mL. There is a 1.2 x 2.1 x 1.9 cm subserosal uterine fibroid in the right posterior myometrium.  Endometrium  Thickness: 10 mm. Fluid with solid  component identified in the endometrial canal.  Right ovary  Not visualized.  Left ovary  Measurements: 1.2 x 2.1 x 1.4 cm = volume: 1.82 mL. Normal appearance/no adnexal mass.  Other findings  No abnormal free fluid.  IMPRESSION: 1. Endometrium measures 10 mm in thickness. Fluid with solid component identified in the endometrial canal. This is abnormal for postmenopausal patient. Recommend endometrial biopsy to exclude malignancy. 2. 2.1 cm subserosal uterine fibroid in the right posterior myometrium.   Electronically Signed By: Anna Barnes M.D. On: 06/08/2023 16:07

## 2023-06-28 ENCOUNTER — Telehealth: Payer: Self-pay | Admitting: Gastroenterology

## 2023-06-28 LAB — SURGICAL PATHOLOGY

## 2023-06-28 NOTE — Telephone Encounter (Signed)
 Pt called to schedule a appt called pt back and left voicemail 4/23

## 2023-06-30 NOTE — Progress Notes (Deleted)
 GI Office Note    Referring Provider: Meldon Sport, MD Primary Care Physician:  Meldon Sport, MD Primary Gastroenterologist: Rolando Cliche. Mordechai April, DO  Date:  07/03/2023  ID:  Laura Mcgee, DOB Nov 21, 1950, MRN [id number]   Chief Complaint   No chief complaint on file.   History of Present Illness  Laura Mcgee is a 73 y.o. female with a history of depression, A-fib, COPD, HLD, HTN, PE 1973, GERD, laryngeal spasm, and chronic RUQ pain presenting today for follow up with complaint of ***  EGD and colonoscopy January 2017. She had a Schatzki ring, Bravo capsule placed, moderate nonerosive gastritis. Bravo study showed nonacid reflux on PPI. 7 colorectal polyps removed, one was a simple adenoma the others hyperplastic. Left colon redundant. Small internal hemorrhoids. Next colonoscopy 5 years (2022).     OV 05/03/22.  Has urge use bathroom in the mornings has a commode for a while but then unable to go.  States this initially hard to push out stools and then her stools will be large.  Continues to have the urgency.  Denies eating during the day, admits to eating after 6 PM as this is a chronic habit.  When eating at night she has pain and nausea.  Sometimes when she does eat she has nausea and pain usually occurs in the right upper quadrant and is intermittent.  Declined colonoscopy.  Denied any melena or BRBPR.  Reported 10 pound weight loss within the prior 6 weeks and decreased appetite.  Offered Cologuard as she refused colonoscopy.  Advise she may benefit from a an upper endoscopy.  Check LFTs and order right upper quadrant ultrasound.  Advised Colace twice daily and MiraLAX  nightly.   RUQ US  05/11/22: -Positive Murphy sign noted by sonographer -CBD measuring 4 mm -Fatty liver -Advised CT scan of the abdomen pelvis if she did not want to try pantoprazole  or omeprazole .   Last office visit 01/16/23.  Complaints of lack of appetite.  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  Having pain in the right upper quadrant mostly and comes and goes and at times recently has felt more frequent.  No vomiting.  Only eating half meals that she prepares.  Drinks milk with every meal.  Does not like chicken or [place], primarily eating fish, sometimes pork or beef.  Has been feeling the need to strain but at times will have urgency and the pain goes away.  Taking senna docusate sodium  combo at times, try not to be dependent on it.  Has not tried any MiraLAX .  Did not feel as though pantoprazole  or omeprazole  were helpful. Advised to start famotidine 20 mg once daily. Miralax  nightly. GERD diet. Recommended protein shakes. CT A/P ordered. Advised will re discuss colonoscopy and EGD for evaluation of weight loss.    CT A/P 02/08/23 IMPRESSION: No evidence of biliary obstruction. Endometrial thickening or fluid within the endometrial canal, abnormal for postmenopausal state. Recommend pelvic ultrasound for further evaluation. 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 No suspicious renal mass, stones or hydronephrosis.  -Advised follow up with GYN.    Today:    Wt Readings from Last 8 Encounters:  06/26/23 151 lb (68.5 kg)  06/08/23 152 lb 9.6 oz (69.2 kg)  05/29/23 152 lb (68.9 kg)  03/21/23 153 lb 3.2 oz (69.5 kg)  01/16/23 152 lb 9.6 oz (69.2 kg)  01/11/23 152 lb (68.9 kg)  12/05/22 154 lb 5.2 oz (70 kg)  12/01/22 154 lb 6.4 oz (70 kg)    Current Outpatient Medications  Medication Sig Dispense Refill   amLODipine  (NORVASC ) 5 MG tablet Take 1 tablet (5 mg total) by mouth in the morning. 90 tablet 1   baclofen  (LIORESAL ) 10 MG tablet Take 1 tablet (10 mg total) by mouth daily as needed for muscle spasms (laryngospasm).     celecoxib  (CELEBREX ) 200 MG capsule Take 1 capsule (200 mg total) by mouth 2 (two) times daily. 60 capsule 3   HYDROcodone -acetaminophen  (NORCO/VICODIN) 5-325 MG tablet Take 1 tablet by mouth every 12  (twelve) hours as needed for moderate pain (pain score 4-6) or severe pain (pain score 7-10). 60 tablet 0   lidocaine  (XYLOCAINE ) 2 % jelly Apply 1 Application topically as needed. 30 mL 3   lovastatin  (MEVACOR ) 20 MG tablet Take 1 tablet (20 mg total) by mouth in the morning. 90 tablet 1   olmesartan  (BENICAR ) 20 MG tablet Take 1 tablet (20 mg total) by mouth in the morning. 90 tablet 1   Propylene Glycol (SYSTANE COMPLETE OP) Place 1 drop into both eyes in the morning, at noon, and at bedtime.     venlafaxine  XR (EFFEXOR -XR) 150 MG 24 hr capsule Take 2 capsules (300 mg total) by mouth every evening. (Patient taking differently: Take 150 mg by mouth in the morning and at bedtime.) 60 capsule 0   No current facility-administered medications for this visit.    Past Medical History:  Diagnosis Date   Arthritis    Atrial fibrillation (HCC)    Cancer (HCC)    Vaginal   COPD (chronic obstructive pulmonary disease) (HCC)    Depression    Difficult intubation    History of laryngospasm   Dysrhythmia    GERD (gastroesophageal reflux disease)    Hyperlipidemia    Hypertension    Numbness and tingling    Pulmonary embolism (HCC) 1973    Past Surgical History:  Procedure Laterality Date   CERVIX LESION DESTRUCTION     throat   COLONOSCOPY WITH PROPOFOL  N/A 04/07/2015   SLF: 1. seven colorectal polyps removed 2. the left colon is redundant 3. small internal hemorrhoids (1 simple adenoma, 6 hyperplastic) . Repeat in 5 years.   CYST EXCISION Left    Arm   ESOPHAGOGASTRODUODENOSCOPY (EGD) WITH PROPOFOL  N/A 04/07/2015   SLF: 1. Schatzki ring 2. Bravo capsule 34 cm from the teeth 3. moderate non-erosive gastritis.    HARDWARE REMOVAL Right 12/05/2022   Procedure: RIGHT HUMERUS HARDWARE REMOVAL;  Surgeon: Tonita Frater, MD;  Location: [facility];  Service: Orthopedics;  Laterality: Right;  NEEDS RNFA   HUMERUS IM NAIL Right 06/25/2021   Procedure: INTRAMEDULLARY (IM) NAIL HUMERAL;  Surgeon:  Tonita Frater, MD;  Location: [facility];  Service: Orthopedics;  Laterality: Right;   MASS EXCISION N/A 2002   Lanyx   MASS EXCISION Left 01/21/2015   Procedure: EXCISION CYST LEFT UPPER ARM;  Surgeon: Alanda Allegra, MD;  Location: [facility];  Service: General;  Laterality: Left;   MICROLARYNGOSCOPY N/A 05/31/2016   Procedure: MICRO LARYNGOSCOPY WITH BIOPSY OF VOCAL CORD LESION;  Surgeon: Reynold Caves, MD;  Location: [facility];  Service: ENT;  Laterality: N/A;   POLYPECTOMY N/A 04/07/2015   Procedure: POLYPECTOMY;  Surgeon: Alyce Jubilee, MD;  [facility];  Service: Endoscopy;  Laterality: N/A;  Descending colon polyps x 3    SAVORY DILATION N/A 04/07/2015   Procedure: SAVORY DILATION;  Surgeon: Quinton Buckler  Fields, MD;  [facility];  Service: Endoscopy;  Laterality: N/A;   VULVECTOMY N/A 07/12/2022   Procedure: SIMPLE PARTIAL VULVECTOMY, ADVANCEMENT FLAP;  Surgeon: Derrel Flies, MD;  Location: [facility];  Service: Gynecology;  Laterality: N/A;    Family History  Problem Relation Age of Onset   Pneumonia Mother    Aneurysm Father    Kidney cancer Sister    Lung cancer Sister    Esophageal cancer Brother    Melanoma Maternal Grandfather    Diabetes Daughter    Colon cancer Neg Hx    Breast cancer Neg Hx    Ovarian cancer Neg Hx    Endometrial cancer Neg Hx    Pancreatic cancer Neg Hx    Prostate cancer Neg Hx     Allergies as of 07/04/2023 - Review Complete 06/08/2023  Allergen Reaction Noted   Gabapentin  Other (See Comments) 05/22/2019   Tetracyclines & related Other (See Comments) 01/15/2015    Social History   Socioeconomic History   Marital status: Single    Spouse name: Not on file   Number of children: 1   Years of education: some college   Highest education level: Not on file  Occupational History   Occupation: Retired  Tobacco Use   Smoking status: Every Day    Current packs/day: 1.00    Average packs/day: 1 pack/day for 25.0 years  (25.0 ttl pk-yrs)    Types: Cigarettes    Passive exposure: Current   Smokeless tobacco: Never   Tobacco comments:    1 ppd as of 06/08/23  Vaping Use   Vaping status: Never Used  Substance and Sexual Activity   Alcohol use: Yes    Comment: Drinks two beer per night.   Drug use: Not Currently    Types: Marijuana    Comment: daily marijuana use, not since 03/2022   Sexual activity: Not Currently    Birth control/protection: Post-menopausal  Other Topics Concern   Not on file  Social History Narrative   Lives at home alone.   Right-handed.   Two cups sweet tea daily.   Social Drivers of Corporate investment banker Strain: Low Risk  (11/30/2022)   Received from [facility]   Overall Financial Resource Strain (CARDIA)    Difficulty of Paying Living Expenses: Not hard at all  Food Insecurity: No Food Insecurity (11/30/2022)   Received [facility]   Hunger Vital Sign    Worried About Running Out of Food in the Last Year: Never true    Ran Out of Food in the Last Year: Never true  Transportation Needs: No Transportation Needs (11/30/2022)   Received from [facility] - Transportation    Lack of Transportation (Medical): No    Lack of Transportation (Non-Medical): No  Physical Activity: Sufficiently Active (11/30/2022)   Received from [facility]   Exercise Vital Sign    Days of Exercise per Week: 5 days    Minutes of Exercise per Session: 30 min  Stress: No Stress Concern Present (11/30/2022)   Received from [facility] - Occupational Stress Questionnaire    Feeling of Stress : Not at all  Social Connections: Moderately Integrated (11/30/2022)   Received from [facility]   Social Connection and Isolation Panel [NHANES]    Frequency of Communication with Friends and Family: Three times a week    Frequency of Social Gatherings with  Friends and Family: Three times a week    Attends Religious Services:  1 to 4 times per year    Active Member of Clubs or Organizations: No    Attends Banker Meetings: 1 to 4 times per year    Marital Status: Never married     Review of Systems   Gen: Denies fever, chills, anorexia. Denies fatigue, weakness, weight loss.  CV: Denies chest pain, palpitations, syncope, peripheral edema, and claudication. Resp: Denies dyspnea at rest, cough, wheezing, coughing up blood, and pleurisy. GI: See HPI Derm: Denies rash, itching, dry skin Psych: Denies depression, anxiety, memory loss, confusion. No homicidal or suicidal ideation.  Heme: Denies bruising, bleeding, and enlarged lymph nodes.  Physical Exam   LMP 06/20/1997 (Approximate) Comment: post menopausal  General:   Alert and oriented. No distress noted. Pleasant and cooperative.  Head:  Normocephalic and atraumatic. Eyes:  Conjuctiva clear without scleral icterus. Mouth:  Oral mucosa pink and moist. Good dentition. No lesions. Lungs:  Clear to auscultation bilaterally. No wheezes, rales, or rhonchi. No distress.  Heart:  S1, S2 present without murmurs appreciated.  Abdomen:  +BS, soft, non-tender and non-distended. No rebound or guarding. No HSM or masses noted. Rectal: *** Msk:  Symmetrical without gross deformities. Normal posture. Extremities:  Without edema. Neurologic:  Alert and  oriented x4 Psych:  Alert and cooperative. Normal mood and affect.  Assessment  Laura Mcgee is a 73 y.o. female with a history of depression, A-fib, COPD, HLD, HTN, PE 1973, GERD, laryngeal spasm, and chronic RUQ pain presenting today for follow up with complaint of ***   Constipation:  Nausea, weight loss:  RUQ pain;  History of colon polyps:   PLAN   ***     Julian Obey, MSN, FNP-BC, AGACNP-BC [facility]

## 2023-07-04 ENCOUNTER — Telehealth: Payer: Self-pay | Admitting: Psychiatry

## 2023-07-04 ENCOUNTER — Ambulatory Visit: Admitting: Gastroenterology

## 2023-07-04 ENCOUNTER — Encounter: Payer: Self-pay | Admitting: Psychiatry

## 2023-07-04 NOTE — Telephone Encounter (Signed)
 Attempted to call pt to review pathology results. Overall benign but scant. Would recommend additional sampling with D&C. Will have clinic call to follow-up given that I did not reach her so we can get a phone visit scheduled to discuss more and get procedure arranged.

## 2023-07-05 ENCOUNTER — Encounter: Payer: Self-pay | Admitting: Family Medicine

## 2023-07-05 ENCOUNTER — Encounter: Payer: Self-pay | Admitting: Gastroenterology

## 2023-07-05 ENCOUNTER — Telehealth: Payer: Self-pay | Admitting: *Deleted

## 2023-07-05 NOTE — Telephone Encounter (Addendum)
 Spoke with Laura Mcgee's daughter Laura Mcgee and relayed message from Dr. Daisey Dryer that she tried to call the patient yesterday evening and was unable to reach her. Overall her pathology results are benign but scant. Dr. Daisey Dryer is recommending additional sampling with D & C. And would like to schedule a phone visit to discuss.   Patient's daughter states her mother is a heavy drinker and smoker and doesn't always answer the phone. States she sleeps late in the morning, and sometimes will start drinking late in day early evening. Daughter states she will let her mother know about phone visit. Phone visit has been scheduled for Monday, May 5 th at 6 pm, but advised pt's daughter Dr. Daisey Dryer could call earlier than 6 pm. Laura Mcgee verbalized understanding and thanked the office for calling. Laura Mcgee would also like to be called once her mother gets scheduled for D & C. Message relayed to providers.

## 2023-07-10 ENCOUNTER — Encounter: Payer: Self-pay | Admitting: Psychiatry

## 2023-07-10 ENCOUNTER — Inpatient Hospital Stay: Attending: Psychiatry | Admitting: Psychiatry

## 2023-07-10 ENCOUNTER — Other Ambulatory Visit: Payer: Self-pay | Admitting: Physician Assistant

## 2023-07-10 DIAGNOSIS — R9389 Abnormal findings on diagnostic imaging of other specified body structures: Secondary | ICD-10-CM | POA: Diagnosis not present

## 2023-07-10 DIAGNOSIS — Z7189 Other specified counseling: Secondary | ICD-10-CM

## 2023-07-10 DIAGNOSIS — G894 Chronic pain syndrome: Secondary | ICD-10-CM

## 2023-07-10 NOTE — Progress Notes (Signed)
 Gynecologic Oncology Telehealth Follow-up Note  I connected with Laura Mcgee on 07/10/23 at  4:00 PM EDT by telephone and verified that I am speaking with the correct person using two identifiers.  I discussed the limitations, risks, security and privacy concerns of performing an evaluation and management service by telemedicine and the availability of in-person appointments. I also discussed with the patient that there may be a patient responsible charge related to this service. The patient expressed understanding and agreed to proceed.  Other persons participating in the visit and their role in the encounter: none.  Patient's location: Home, [place] Provider's location: [facility]  Date of Service: 07/10/2023 Referring Provider: Lendia Quay, NP [place], DO   Assessment & Plan: Laura Mcgee is a 73 y.o. 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 CT in December to r/o biliary duct obstruction with incidental finding of endometrial thickening, recent pelvic ultrasound confirms with stripe of 10mm. Scant endometrial biopsy, follow-up for treatment discussion.  Endometrial thickening: -Endometrial biopsy 06/26/23 benign but scant - Recommend additional sampling  Patient was consented for: hysteroscopy, dilation and curettage on TBD. Offered upcoming dates of 5/13 and 6/10. Pt gives us  permission to call her daughter and offer dates and will do whatever works best for her daughter  The risks of surgery were discussed in detail and she understands these to including but not limited to bleeding requiring a blood transfusion, infection, injury to adjacent organs (including but not limited to the bowels, bladder, ureters, nerves, blood vessels), thromboembolic events, unforseen complication, possible need for re-exploration, and medical complications such as heart attack, stroke, pneumonia.  If the patient experiences any of these events,  she understands that her hospitalization or recovery may be prolonged and that she may need to take additional medications for a prolonged period. The patient will receive DVT and antibiotic prophylaxis as indicated. She voiced a clear understanding. She had the opportunity to ask questions and informed consent was obtained today. She wishes to proceed.  She does not require preoperative clearance. Her METs are >4.  All preoperative instructions were reviewed. Postoperative expectations were also reviewed.   Vulvar cancer: - NED on prior exam - Given only 1mm DOI, negative margins, okay to proceed with close surveillance.  - Signs/symptoms of recurrence reviewed. - Recommend surveillance q64mo for the first 2 years.  Chronic pain: - Has established with new PCP in [place].  For treatment to PCP.  RTC postop, then 66mo as previously scheduled.  Derrel Flies, MD Gynecologic Oncology   Medical Decision Making I personally spent  TOTAL 10 minutes via phone in the care of this patient.    ----------------------- Reason for Visit: Follow-up  Treatment History: Oncology History  Vulvar cancer (HCC)  07/12/2022 Surgery   Simple partial vulvectomy, advancement flap    07/12/2022 Initial Diagnosis   Vulvar cancer (HCC)   07/12/2022 Pathology Results   FINAL MICROSCOPIC DIAGNOSIS:   A. VULVA, POSTERIOR, PARTIAL VULVECTOMY:  - Superficially invasive squamous cell carcinoma arising in high grade  squamous intraepithelial lesion, HPV associated (see Comment)  - Margins not involved by high grade dysplasia or carcinoma   COMMENT:  - Morphologic evaluation reveals multifocal areas of superficial  invasion up to 0.1 cm depth of invasion. P53 shows wild type staining.  P16 is strong positive in the lesion. This case was reviewed with Dr. Talmadge Fail  who agrees with the diagnosis.    07/12/2022 Cancer Staging   Staging  form: Vulva, AJCC V9 - Pathologic stage from 07/12/2022: FIGO Stage IA (pT1a,  pNX, cM0) - Signed by Derrel Flies, MD on 08/10/2022 Histopathologic type: Squamous cell carcinoma, NOS Stage prefix: Initial diagnosis     Interval History: No major changes since her last visit.  Patient denies vaginal bleeding.  Reports that she drinks 212 ounce beers at night every night.  This is very stable for her.  She was drinking this much around the time of her prior surgery with me.  Does not need a drink in the morning when she wakes up.   Past Medical/Surgical History: Past Medical History:  Diagnosis Date   Arthritis    Atrial fibrillation (HCC)    Cancer (HCC)    Vaginal   COPD (chronic obstructive pulmonary disease) (HCC)    Depression    Difficult intubation    History of laryngospasm   Dysrhythmia    GERD (gastroesophageal reflux disease)    Hyperlipidemia    Hypertension    Numbness and tingling    Pulmonary embolism (HCC) 1973    Past Surgical History:  Procedure Laterality Date   CERVIX LESION DESTRUCTION     throat   COLONOSCOPY WITH PROPOFOL  N/A 04/07/2015   SLF: 1. seven colorectal polyps removed 2. the left colon is redundant 3. small internal hemorrhoids (1 simple adenoma, 6 hyperplastic) . Repeat in 5 years.   CYST EXCISION Left    Arm   ESOPHAGOGASTRODUODENOSCOPY (EGD) WITH PROPOFOL  N/A 04/07/2015   SLF: 1. Schatzki ring 2. Bravo capsule 34 cm from the teeth 3. moderate non-erosive gastritis.    HARDWARE REMOVAL Right 12/05/2022   Procedure: RIGHT HUMERUS HARDWARE REMOVAL;  Surgeon: Tonita Frater, MD;  Location: [facility];  Service: Orthopedics;  Laterality: Right;  NEEDS RNFA   HUMERUS IM NAIL Right 06/25/2021   Procedure: INTRAMEDULLARY (IM) NAIL HUMERAL;  Surgeon: Tonita Frater, MD;  Location: [facility];  Service: Orthopedics;  Laterality: Right;   MASS EXCISION N/A 2002   Lanyx   MASS EXCISION Left 01/21/2015   Procedure: EXCISION CYST LEFT UPPER ARM;  Surgeon: Alanda Allegra, MD;  Location: [facility];  Service: General;  Laterality: Left;    MICROLARYNGOSCOPY N/A 05/31/2016   Procedure: MICRO LARYNGOSCOPY WITH BIOPSY OF VOCAL CORD LESION;  Surgeon: Reynold Caves, MD;  Location: [facility];  Service: ENT;  Laterality: N/A;   POLYPECTOMY N/A 04/07/2015   Procedure: POLYPECTOMY;  Surgeon: Alyce Jubilee, MD;  Location: [facility];  Service: Endoscopy;  Laterality: N/A;  Descending colon polyps x 3    SAVORY DILATION N/A 04/07/2015   Procedure: SAVORY DILATION;  Surgeon: Alyce Jubilee, MD;  Location: [facility];  Service: Endoscopy;  Laterality: N/A;   VULVECTOMY N/A 07/12/2022   Procedure: SIMPLE PARTIAL VULVECTOMY, ADVANCEMENT FLAP;  Surgeon: Derrel Flies, MD;  Location: [facility];  Service: Gynecology;  Laterality: N/A;    Family History  Problem Relation Age of Onset   Pneumonia Mother    Aneurysm Father    Kidney cancer Sister    Lung cancer Sister    Esophageal cancer Brother    Melanoma Maternal Grandfather    Diabetes Daughter    Colon cancer Neg Hx    Breast cancer Neg Hx    Ovarian cancer Neg Hx    Endometrial cancer Neg Hx    Pancreatic cancer Neg Hx    Prostate cancer Neg Hx     Social History   Socioeconomic History   Marital  status: Single    Spouse name: Not on file   Number of children: 1   Years of education: some college   Highest education level: Not on file  Occupational History   Occupation: Retired  Tobacco Use   Smoking status: Every Day    Current packs/day: 1.00    Average packs/day: 1 pack/day for 25.0 years (25.0 ttl pk-yrs)    Types: Cigarettes    Passive exposure: Current   Smokeless tobacco: Never   Tobacco comments:    1 ppd as of 06/08/23  Vaping Use   Vaping status: Never Used  Substance and Sexual Activity   Alcohol use: Yes    Comment: Drinks two beer per night.   Drug use: Not Currently    Types: Marijuana    Comment: daily marijuana use, not since 03/2022   Sexual activity: Not Currently    Birth control/protection: Post-menopausal  Other Topics  Concern   Not on file  Social History Narrative   Lives at home alone.   Right-handed.   Two cups sweet tea daily.   Social Drivers of Corporate investment banker Strain: Low Risk  (11/30/2022)   Received from [facility]   Overall Financial Resource Strain (CARDIA)    Difficulty of Paying Living Expenses: Not hard at all  Food Insecurity: No Food Insecurity (11/30/2022)   Received from [facility]   Hunger Vital Sign    Worried About Running Out of Food in the Last Year: Never true    Ran Out of Food in the Last Year: Never true  Transportation Needs: No Transportation Needs (11/30/2022)   Received from [facility] - Transportation    Lack of Transportation (Medical): No    Lack of Transportation (Non-Medical): No  Physical Activity: Sufficiently Active (11/30/2022)   Received from [facility]   Exercise Vital Sign    Days of Exercise per Week: 5 days    Minutes of Exercise per Session: 30 min  Stress: No Stress Concern Present (11/30/2022)   Received from [facility] - Occupational Stress Questionnaire    Feeling of Stress : Not at all  Social Connections: Moderately Integrated (11/30/2022)   Received from [facility]   Social Connection and Isolation Panel [NHANES]    Frequency of Communication with Friends and Family: Three times a week    Frequency of Social Gatherings with Friends and Family: Three times a week    Attends Religious Services: 1 to 4 times per year    Active Member of Clubs or Organizations: No    Attends Banker Meetings: 1 to 4 times per year    Marital Status: Never married    Current Medications:  Current Outpatient Medications:    amLODipine  (NORVASC ) 5 MG tablet, Take 1 tablet (5 mg total) by mouth in the morning., Disp: 90 tablet, Rfl: 1   baclofen  (LIORESAL ) 10 MG tablet, Take 1 tablet (10 mg total) by mouth daily as needed for muscle spasms (laryngospasm).,  Disp: , Rfl:    celecoxib  (CELEBREX ) 200 MG capsule, Take 1 capsule (200 mg total) by mouth 2 (two) times daily., Disp: 60 capsule, Rfl: 3   HYDROcodone -acetaminophen  (NORCO/VICODIN) 5-325 MG tablet, Take 1 tablet by mouth every 12 (twelve) hours as needed for moderate pain (pain score 4-6) or severe pain (pain score 7-10)., Disp: 60 tablet, Rfl: 0   lidocaine  (XYLOCAINE ) 2 % jelly,  Apply 1 Application topically as needed., Disp: 30 mL, Rfl: 3   lovastatin  (MEVACOR ) 20 MG tablet, Take 1 tablet (20 mg total) by mouth in the morning., Disp: 90 tablet, Rfl: 1   olmesartan  (BENICAR ) 20 MG tablet, Take 1 tablet (20 mg total) by mouth in the morning., Disp: 90 tablet, Rfl: 1   Propylene Glycol (SYSTANE COMPLETE OP), Place 1 drop into both eyes in the morning, at noon, and at bedtime., Disp: , Rfl:    venlafaxine  XR (EFFEXOR -XR) 150 MG 24 hr capsule, Take 2 capsules (300 mg total) by mouth every evening. (Patient taking differently: Take 150 mg by mouth in the morning and at bedtime.), Disp: 60 capsule, Rfl: 0  Review of Symptoms: Complete 10-system review is positive for: none  Physical Exam: Deferred given limitations of phone visit.   Laboratory & Radiologic Studies: Surgical pathology (06/26/23): A. ENDOMETRIAL, BIOPSY:  Scanty inactive endometrial glands and mucus.  Negative for endometrial intraepithelial neoplasia (EIN) and malignancy.

## 2023-07-11 ENCOUNTER — Telehealth: Payer: Self-pay

## 2023-07-11 NOTE — Telephone Encounter (Signed)
 Burdette Carolin, Pt's daughter, called stating June 10 will work for her. She is aware she will get a call from [facility] to schedule a pre-admit appointment.   Vira Grieves NP aware

## 2023-07-11 NOTE — Telephone Encounter (Signed)
 Per Vira Grieves NP, LVM for Ms.Doo's daughter, Burdette Carolin, to call office regarding potential surgery dates with Dr.Newton

## 2023-07-11 NOTE — Telephone Encounter (Signed)
-----   Message from Suellyn Emory sent at 07/11/2023  8:57 AM EDT ----- Dr. Daisey Dryer had a phone visit with her last pm. She recommended doing a dilation and curettage of the uterus. Dates proposed included May 13 or June 10. Please reach out to the daughter and see which date would work best for her and her schedule. It would be an outpatient procedure.   Thanks

## 2023-07-16 ENCOUNTER — Other Ambulatory Visit: Payer: Self-pay

## 2023-07-16 ENCOUNTER — Inpatient Hospital Stay (HOSPITAL_COMMUNITY)
Admission: EM | Admit: 2023-07-16 | Discharge: 2023-07-19 | DRG: 871 | Disposition: A | Discharge status: Home / Self Care | Attending: Internal Medicine | Admitting: Internal Medicine

## 2023-07-16 ENCOUNTER — Encounter (HOSPITAL_COMMUNITY): Payer: Self-pay | Admitting: *Deleted

## 2023-07-16 ENCOUNTER — Emergency Department (HOSPITAL_COMMUNITY)

## 2023-07-16 DIAGNOSIS — Z833 Family history of diabetes mellitus: Secondary | ICD-10-CM

## 2023-07-16 DIAGNOSIS — R0781 Pleurodynia: Secondary | ICD-10-CM | POA: Diagnosis not present

## 2023-07-16 DIAGNOSIS — E785 Hyperlipidemia, unspecified: Secondary | ICD-10-CM | POA: Diagnosis present

## 2023-07-16 DIAGNOSIS — F1090 Alcohol use, unspecified, uncomplicated: Secondary | ICD-10-CM | POA: Diagnosis present

## 2023-07-16 DIAGNOSIS — J189 Pneumonia, unspecified organism: Secondary | ICD-10-CM | POA: Diagnosis present

## 2023-07-16 DIAGNOSIS — A419 Sepsis, unspecified organism: Secondary | ICD-10-CM | POA: Diagnosis present

## 2023-07-16 DIAGNOSIS — Z808 Family history of malignant neoplasm of other organs or systems: Secondary | ICD-10-CM | POA: Diagnosis not present

## 2023-07-16 DIAGNOSIS — R109 Unspecified abdominal pain: Principal | ICD-10-CM

## 2023-07-16 DIAGNOSIS — Z1152 Encounter for screening for COVID-19: Secondary | ICD-10-CM | POA: Diagnosis not present

## 2023-07-16 DIAGNOSIS — R6520 Severe sepsis without septic shock: Secondary | ICD-10-CM | POA: Diagnosis present

## 2023-07-16 DIAGNOSIS — R1901 Right upper quadrant abdominal swelling, mass and lump: Secondary | ICD-10-CM | POA: Diagnosis not present

## 2023-07-16 DIAGNOSIS — K219 Gastro-esophageal reflux disease without esophagitis: Secondary | ICD-10-CM | POA: Diagnosis present

## 2023-07-16 DIAGNOSIS — R2689 Other abnormalities of gait and mobility: Secondary | ICD-10-CM | POA: Diagnosis not present

## 2023-07-16 DIAGNOSIS — R918 Other nonspecific abnormal finding of lung field: Secondary | ICD-10-CM | POA: Diagnosis not present

## 2023-07-16 DIAGNOSIS — R1032 Left lower quadrant pain: Secondary | ICD-10-CM | POA: Diagnosis not present

## 2023-07-16 DIAGNOSIS — R0789 Other chest pain: Secondary | ICD-10-CM | POA: Diagnosis not present

## 2023-07-16 DIAGNOSIS — J440 Chronic obstructive pulmonary disease with acute lower respiratory infection: Secondary | ICD-10-CM | POA: Diagnosis present

## 2023-07-16 DIAGNOSIS — Z888 Allergy status to other drugs, medicaments and biological substances status: Secondary | ICD-10-CM | POA: Diagnosis not present

## 2023-07-16 DIAGNOSIS — I10 Essential (primary) hypertension: Secondary | ICD-10-CM | POA: Diagnosis present

## 2023-07-16 DIAGNOSIS — Z881 Allergy status to other antibiotic agents status: Secondary | ICD-10-CM

## 2023-07-16 DIAGNOSIS — E876 Hypokalemia: Secondary | ICD-10-CM | POA: Diagnosis present

## 2023-07-16 DIAGNOSIS — J984 Other disorders of lung: Secondary | ICD-10-CM | POA: Diagnosis not present

## 2023-07-16 DIAGNOSIS — I480 Paroxysmal atrial fibrillation: Secondary | ICD-10-CM | POA: Diagnosis present

## 2023-07-16 DIAGNOSIS — Z79899 Other long term (current) drug therapy: Secondary | ICD-10-CM

## 2023-07-16 DIAGNOSIS — E872 Acidosis, unspecified: Secondary | ICD-10-CM | POA: Diagnosis present

## 2023-07-16 DIAGNOSIS — K5730 Diverticulosis of large intestine without perforation or abscess without bleeding: Secondary | ICD-10-CM | POA: Diagnosis not present

## 2023-07-16 DIAGNOSIS — Z86711 Personal history of pulmonary embolism: Secondary | ICD-10-CM | POA: Diagnosis not present

## 2023-07-16 DIAGNOSIS — I4891 Unspecified atrial fibrillation: Secondary | ICD-10-CM | POA: Diagnosis not present

## 2023-07-16 DIAGNOSIS — R079 Chest pain, unspecified: Secondary | ICD-10-CM | POA: Diagnosis not present

## 2023-07-16 DIAGNOSIS — F17210 Nicotine dependence, cigarettes, uncomplicated: Secondary | ICD-10-CM | POA: Diagnosis present

## 2023-07-16 DIAGNOSIS — E871 Hypo-osmolality and hyponatremia: Secondary | ICD-10-CM | POA: Diagnosis present

## 2023-07-16 DIAGNOSIS — M129 Arthropathy, unspecified: Secondary | ICD-10-CM | POA: Diagnosis not present

## 2023-07-16 DIAGNOSIS — Z8544 Personal history of malignant neoplasm of other female genital organs: Secondary | ICD-10-CM | POA: Diagnosis not present

## 2023-07-16 DIAGNOSIS — Z7141 Alcohol abuse counseling and surveillance of alcoholic: Secondary | ICD-10-CM | POA: Diagnosis not present

## 2023-07-16 DIAGNOSIS — D3502 Benign neoplasm of left adrenal gland: Secondary | ICD-10-CM | POA: Diagnosis not present

## 2023-07-16 DIAGNOSIS — D3501 Benign neoplasm of right adrenal gland: Secondary | ICD-10-CM | POA: Diagnosis not present

## 2023-07-16 DIAGNOSIS — Z720 Tobacco use: Secondary | ICD-10-CM | POA: Diagnosis present

## 2023-07-16 LAB — CBC WITH DIFFERENTIAL/PLATELET
Abs Immature Granulocytes: 0.9 10*3/uL — ABNORMAL HIGH (ref 0.00–0.07)
Band Neutrophils: 9 %
Basophils Absolute: 0 10*3/uL (ref 0.0–0.1)
Basophils Relative: 0 %
Eosinophils Absolute: 0.2 10*3/uL (ref 0.0–0.5)
Eosinophils Relative: 1 %
HCT: 44.4 % (ref 36.0–46.0)
Hemoglobin: 15.7 g/dL — ABNORMAL HIGH (ref 12.0–15.0)
Lymphocytes Relative: 10 %
Lymphs Abs: 2.2 10*3/uL (ref 0.7–4.0)
MCH: 33.7 pg (ref 26.0–34.0)
MCHC: 35.4 g/dL (ref 30.0–36.0)
MCV: 95.3 fL (ref 80.0–100.0)
Metamyelocytes Relative: 4 %
Monocytes Absolute: 1.3 10*3/uL — ABNORMAL HIGH (ref 0.1–1.0)
Monocytes Relative: 6 %
Neutro Abs: 17.4 10*3/uL — ABNORMAL HIGH (ref 1.7–7.7)
Neutrophils Relative %: 70 %
Platelets: 217 10*3/uL (ref 150–400)
RBC: 4.66 MIL/uL (ref 3.87–5.11)
RDW: 14.7 % (ref 11.5–15.5)
WBC: 22 10*3/uL — ABNORMAL HIGH (ref 4.0–10.5)
nRBC: 0 % (ref 0.0–0.2)

## 2023-07-16 LAB — RESP PANEL BY RT-PCR (RSV, FLU A&B, COVID)  RVPGX2
Influenza A by PCR: NEGATIVE
Influenza B by PCR: NEGATIVE
Resp Syncytial Virus by PCR: NEGATIVE
SARS Coronavirus 2 by RT PCR: NEGATIVE

## 2023-07-16 LAB — URINALYSIS, W/ REFLEX TO CULTURE (INFECTION SUSPECTED)
Bacteria, UA: NONE SEEN
Bilirubin Urine: NEGATIVE
Glucose, UA: NEGATIVE mg/dL
Ketones, ur: NEGATIVE mg/dL
Leukocytes,Ua: NEGATIVE
Nitrite: NEGATIVE
Protein, ur: NEGATIVE mg/dL
Specific Gravity, Urine: 1.018 (ref 1.005–1.030)
pH: 6 (ref 5.0–8.0)

## 2023-07-16 LAB — COMPREHENSIVE METABOLIC PANEL WITH GFR
ALT: 14 U/L (ref 0–44)
AST: 16 U/L (ref 15–41)
Albumin: 2.9 g/dL — ABNORMAL LOW (ref 3.5–5.0)
Alkaline Phosphatase: 88 U/L (ref 38–126)
Anion gap: 10 (ref 5–15)
BUN: 13 mg/dL (ref 8–23)
CO2: 21 mmol/L — ABNORMAL LOW (ref 22–32)
Calcium: 8.9 mg/dL (ref 8.9–10.3)
Chloride: 97 mmol/L — ABNORMAL LOW (ref 98–111)
Creatinine, Ser: 0.84 mg/dL (ref 0.44–1.00)
GFR, Estimated: >60 mL/min (ref >60)
Glucose, Bld: 104 mg/dL — ABNORMAL HIGH (ref 70–99)
Potassium: 3.6 mmol/L (ref 3.5–5.1)
Sodium: 128 mmol/L — ABNORMAL LOW (ref 135–145)
Total Bilirubin: 0.9 mg/dL (ref 0.0–1.2)
Total Protein: 6.4 g/dL — ABNORMAL LOW (ref 6.5–8.1)

## 2023-07-16 LAB — LACTIC ACID, PLASMA: Lactic Acid, Venous: 3.7 mmol/L (ref 0.5–1.9)

## 2023-07-16 LAB — D-DIMER, QUANTITATIVE: D-Dimer, Quant: 2.85 ug{FEU}/mL — ABNORMAL HIGH (ref 0.00–0.50)

## 2023-07-16 LAB — TROPONIN I (HIGH SENSITIVITY)
Troponin I (High Sensitivity): 9 ng/L (ref <18)
Troponin I (High Sensitivity): 8 ng/L (ref <18)

## 2023-07-16 LAB — BRAIN NATRIURETIC PEPTIDE: B Natriuretic Peptide: 261 pg/mL — ABNORMAL HIGH (ref 0.0–100.0)

## 2023-07-16 MED ORDER — LACTATED RINGERS IV BOLUS
1000.0000 mL | Freq: Once | INTRAVENOUS | Status: AC
  Administered 2023-07-16: 1000 mL via INTRAVENOUS

## 2023-07-16 MED ORDER — SODIUM CHLORIDE 0.9 % IV SOLN: INTRAVENOUS | Status: AC

## 2023-07-16 MED ORDER — LACTATED RINGERS IV BOLUS
1000.0000 mL | Freq: Once | INTRAVENOUS | Status: AC
  Administered 2023-07-17: 1000 mL via INTRAVENOUS

## 2023-07-16 MED ORDER — ONDANSETRON HCL 4 MG PO TABS
4.0000 mg | ORAL_TABLET | Freq: Four times a day (QID) | ORAL | Status: DC | PRN
Start: 2023-07-16 — End: 2023-07-19

## 2023-07-16 MED ORDER — SODIUM CHLORIDE 0.9 % IV SOLN
1.0000 g | Freq: Once | INTRAVENOUS | Status: AC
  Administered 2023-07-16: 1 g via INTRAVENOUS
  Filled 2023-07-16: qty 10

## 2023-07-16 MED ORDER — HYDROCODONE-ACETAMINOPHEN 5-325 MG PO TABS
2.0000 | ORAL_TABLET | Freq: Four times a day (QID) | ORAL | Status: DC | PRN
  Administered 2023-07-17 – 2023-07-19 (×5): 2 via ORAL
  Filled 2023-07-16 (×5): qty 2

## 2023-07-16 MED ORDER — AMLODIPINE BESYLATE 5 MG PO TABS
5.0000 mg | ORAL_TABLET | Freq: Every morning | ORAL | Status: DC
  Administered 2023-07-17: 5 mg via ORAL
  Filled 2023-07-16: qty 1

## 2023-07-16 MED ORDER — IOHEXOL 350 MG/ML SOLN
100.0000 mL | Freq: Once | INTRAVENOUS | Status: AC | PRN
  Administered 2023-07-16: 100 mL via INTRAVENOUS

## 2023-07-16 MED ORDER — ALBUTEROL SULFATE (2.5 MG/3ML) 0.083% IN NEBU
INHALATION_SOLUTION | RESPIRATORY_TRACT | Status: AC
  Administered 2023-07-16: 2.5 mg
  Filled 2023-07-16: qty 3

## 2023-07-16 MED ORDER — SODIUM CHLORIDE 0.9 % IV SOLN
2.0000 g | INTRAVENOUS | Status: DC
  Administered 2023-07-17 – 2023-07-18 (×2): 2 g via INTRAVENOUS
  Filled 2023-07-16 (×2): qty 20

## 2023-07-16 MED ORDER — IPRATROPIUM-ALBUTEROL 0.5-2.5 (3) MG/3ML IN SOLN: 3.0000 mL | RESPIRATORY_TRACT | Status: DC | PRN

## 2023-07-16 MED ORDER — MORPHINE SULFATE (PF) 4 MG/ML IV SOLN
4.0000 mg | Freq: Once | INTRAVENOUS | Status: DC
  Filled 2023-07-16: qty 1

## 2023-07-16 MED ORDER — POLYETHYLENE GLYCOL 3350 17 G PO PACK: 17.0000 g | PACK | Freq: Every day | ORAL | Status: DC | PRN

## 2023-07-16 MED ORDER — ENOXAPARIN SODIUM 40 MG/0.4ML IJ SOSY
40.0000 mg | PREFILLED_SYRINGE | INTRAMUSCULAR | Status: DC
Start: 2023-07-16 — End: 2023-07-19
  Administered 2023-07-16 – 2023-07-18 (×3): 40 mg via SUBCUTANEOUS
  Filled 2023-07-16 (×3): qty 0.4

## 2023-07-16 MED ORDER — ONDANSETRON HCL 4 MG/2ML IJ SOLN: 4.0000 mg | Freq: Four times a day (QID) | INTRAMUSCULAR | Status: DC | PRN

## 2023-07-16 MED ORDER — HYDROCODONE-ACETAMINOPHEN 5-325 MG PO TABS
1.0000 | ORAL_TABLET | Freq: Once | ORAL | Status: AC
  Administered 2023-07-16: 1 via ORAL
  Filled 2023-07-16: qty 1

## 2023-07-16 MED ORDER — IPRATROPIUM-ALBUTEROL 0.5-2.5 (3) MG/3ML IN SOLN
3.0000 mL | Freq: Once | RESPIRATORY_TRACT | Status: AC
  Administered 2023-07-16: 3 mL via RESPIRATORY_TRACT
  Filled 2023-07-16: qty 3

## 2023-07-16 MED ORDER — ONDANSETRON HCL 4 MG/2ML IJ SOLN
4.0000 mg | Freq: Four times a day (QID) | INTRAMUSCULAR | Status: DC | PRN
  Filled 2023-07-16: qty 2

## 2023-07-16 MED ORDER — VENLAFAXINE HCL ER 75 MG PO CP24
150.0000 mg | ORAL_CAPSULE | Freq: Every day | ORAL | Status: DC
  Administered 2023-07-16 – 2023-07-19 (×4): 150 mg via ORAL
  Filled 2023-07-16: qty 2
  Filled 2023-07-16: qty 4
  Filled 2023-07-16 (×2): qty 2

## 2023-07-16 MED ORDER — ACETAMINOPHEN 650 MG RE SUPP: 650.0000 mg | Freq: Four times a day (QID) | RECTAL | Status: DC | PRN

## 2023-07-16 MED ORDER — SODIUM CHLORIDE 0.9 % IV SOLN
500.0000 mg | INTRAVENOUS | Status: DC
  Administered 2023-07-16 – 2023-07-18 (×3): 500 mg via INTRAVENOUS
  Filled 2023-07-16 (×3): qty 5

## 2023-07-16 MED ORDER — ACETAMINOPHEN 325 MG PO TABS: 650.0000 mg | ORAL_TABLET | Freq: Four times a day (QID) | ORAL | Status: DC | PRN

## 2023-07-16 NOTE — Assessment & Plan Note (Signed)
 A-fib documented in 2023,.  In the setting of right humeral fracture.  Not started on anticoagulation due to history of alcohol abuse.  EKG currently shows sinus rhythm.  Not on rate limiting drugs.

## 2023-07-16 NOTE — ED Provider Notes (Signed)
 [place] EMERGENCY DEPARTMENT AT [facility] Provider Note   CSN: [id number] Arrival date & time: 07/16/23  1638     History  Chief Complaint  Patient presents with   Chest Pain    Laura Mcgee is a 73 y.o. 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 Worse with deep breaths. Nonradiating. Doesn't take a blood thinner. No h/o similar pain. No urinary sxs, N/V/D/C, leg swelling, recent travel/hospitalizations/surgeries. Has h/o PE remotely. Denies trauma/falls.  50-pack-year smoking history.  No known diagnosis of COPD per the patient however it is listed in her chart.   Past Medical History:  Diagnosis Date   Arthritis    Atrial fibrillation (HCC)    Cancer (HCC)    Vaginal   COPD (chronic obstructive pulmonary disease) (HCC)    Depression    Difficult intubation    History of laryngospasm   Dysrhythmia    GERD (gastroesophageal reflux disease)    Hyperlipidemia    Hypertension    Numbness and tingling    Pulmonary embolism (HCC) 1973       Home Medications Prior to Admission medications   Medication Sig Start Date End Date Taking? Authorizing Provider  amLODipine  (NORVASC ) 5 MG tablet Take 1 tablet (5 mg total) by mouth in the morning. 06/08/23  Yes Patel, Artie Bimler, MD  Apoaequorin (PREVAGEN PO) Take 1 tablet by mouth daily.   Yes [provider]  baclofen  (LIORESAL ) 10 MG tablet Take 1 tablet (10 mg total) by mouth daily as needed for muscle spasms (laryngospasm). 06/08/23  Yes Meldon Sport, MD  HYDROcodone -acetaminophen  (NORCO/VICODIN) 5-325 MG tablet Take 1 tablet by mouth every 12 (twelve) hours as needed for moderate pain (pain score 4-6) or severe pain (pain score 7-10). 06/23/23  Yes Meldon Sport, MD  lovastatin  (MEVACOR ) 20 MG tablet Take 1 tablet (20 mg total) by mouth  in the morning. 06/08/23  Yes Meldon Sport, MD  olmesartan  (BENICAR ) 20 MG tablet Take 1 tablet (20 mg total) by mouth in the morning. 06/08/23  Yes Meldon Sport, MD  Propylene Glycol (SYSTANE COMPLETE OP) Place 1 drop into both eyes in the morning, at noon, and at bedtime.   Yes [provider]  venlafaxine  XR (EFFEXOR -XR) 150 MG 24 hr capsule Take 2 capsules (300 mg total) by mouth every evening. Patient taking differently: Take 150 mg by mouth daily. 06/30/21  Yes Marilyne Shu, NP  celecoxib  (CELEBREX ) 200 MG capsule Take 1 capsule (200 mg total) by mouth 2 (two) times daily. Patient not taking: Reported on 07/16/2023 03/21/23   Grooms, [place], PA-C  lidocaine  (XYLOCAINE ) 2 % jelly Apply 1 Application topically as needed. 11/21/22   Derrel Flies, MD      Allergies    Gabapentin  and Tetracyclines & related    Review of Systems   Review of Systems A 10 point review of systems was performed and is negative unless otherwise reported in HPI.  Physical Exam Updated Vital Signs BP 112/69   Pulse 100   Temp 98.3 F (36.8 C) (Oral)   Resp 17   Ht 5\' 6"  (1.676 m)   Wt 68.5 kg   LMP 06/20/1997 (Approximate) Comment: post menopausal  SpO2 93%   BMI 24.37 kg/m  Physical Exam General: Uncomfortable appearing female, lying in bed.  HEENT:  PERRLA, Sclera anicteric, MMM, trachea midline.  Cardiology: RRR, no murmurs/rubs/gallops. BL radial and DP pulses equal bilaterally. TTP over lower left lateral ribs. Resp: Normal respiratory rate and effort. Expiratory wheezing bilaterally. Abd: Soft, +TTP in LUQ, LLQ, L flank. non-distended. No rebound tenderness or guarding. No gross abnormalities or e/o trauma.  GU: Deferred. MSK: No peripheral edema or signs of trauma. Extremities without deformity or TTP. No cyanosis or clubbing. Skin: warm, dry.  Back: No CVA tenderness Neuro: A&Ox4, CNs II-XII grossly intact. MAEs. Sensation grossly intact.  Psych: Normal mood and affect.    ED Results / Procedures / Treatments   Labs (all labs ordered are listed, but only abnormal results are displayed) Labs Reviewed  CBC WITH DIFFERENTIAL/PLATELET - Abnormal; Notable for the following components:      Result Value   WBC 22.0 (*)    Hemoglobin 15.7 (*)    Neutro Abs 17.4 (*)    Monocytes Absolute 1.3 (*)    Abs Immature Granulocytes 0.90 (*)    All other components within normal limits  COMPREHENSIVE METABOLIC PANEL WITH GFR - Abnormal; Notable for the following components:   Sodium 128 (*)    Chloride 97 (*)    CO2 21 (*)    Glucose, Bld 104 (*)    Total Protein 6.4 (*)    Albumin 2.9 (*)    All other components within normal limits  D-DIMER, QUANTITATIVE - Abnormal; Notable for the following components:   D-Dimer, Quant 2.85 (*)    All other components within normal limits  BRAIN NATRIURETIC PEPTIDE - Abnormal; Notable for the following components:   B Natriuretic Peptide 261.0 (*)    All other components within normal limits  URINALYSIS, W/ REFLEX TO CULTURE (INFECTION SUSPECTED) - Abnormal; Notable for the following components:   APPearance HAZY (*)    Hgb urine dipstick SMALL (*)    All other components within normal limits  RESP PANEL BY RT-PCR (RSV, FLU A&B, COVID)  RVPGX2  TROPONIN I (HIGH SENSITIVITY)  TROPONIN I (HIGH SENSITIVITY)    EKG None  Radiology CT Angio Chest PE W and/or Wo Contrast Result Date: 07/16/2023 CLINICAL DATA:  Left-sided chest pain. EXAM: CT ANGIOGRAPHY CHEST WITH CONTRAST TECHNIQUE: Multidetector CT imaging of the chest was performed using the standard protocol during bolus administration of intravenous contrast. Multiplanar CT image reconstructions and MIPs were obtained to evaluate the vascular anatomy. RADIATION DOSE REDUCTION: This exam was performed according to the departmental dose-optimization program which includes automated exposure control, adjustment of the mA and/or kV according to patient size and/or use of  iterative reconstruction technique. CONTRAST:  OMNIPAQUE  IOHEXOL  350 MG/ML SOLN COMPARISON:  April 28, 2016 FINDINGS: Cardiovascular: There is marked severity calcification of the thoracic aorta, without evidence of aortic aneurysm satisfactory opacification of the pulmonary arteries to the segmental level. No evidence of pulmonary embolism. Normal heart size with marked severity coronary artery calcification. No pericardial effusion. Mediastinum/Nodes: No enlarged mediastinal, hilar, or axillary lymph nodes. Thyroid gland, trachea, and esophagus demonstrate no significant findings. Lungs/Pleura: Mild multifocal left upper lobe and moderate to marked severity posteromedial left lower lobe airspace disease is seen. Mild posterior right lower lobe atelectasis and/or infiltrate is also noted. There is a stable, likely benign 8 mm noncalcified pulmonary nodule seen within the posterior aspect of the right lower lobe (axial CT image 70, CT series 4). No pleural effusion or pneumothorax is identified. Upper Abdomen: A 4.4 cm low-attenuation right adrenal mass is seen (approximately 1.27  Hounsfield units). An additional 4.7 cm low-attenuation left adrenal mass is noted (approximately -2.49 Hounsfield units). There is bilateral nonspecific moderate to marked severity perinephric inflammatory fat stranding. Musculoskeletal: No chest wall abnormality. No acute or significant osseous findings. Review of the MIP images confirms the above findings. IMPRESSION: 1. No evidence of pulmonary embolism. 2. Mild multifocal left upper lobe and moderate to marked severity posteromedial left lower lobe airspace disease, consistent with multifocal pneumonia. 3. Mild posterior right lower lobe atelectasis and/or infiltrate. 4. Bilateral low-attenuation adrenal masses, consistent with adrenal adenomas. 5. Bilateral nonspecific moderate to marked severity perinephric inflammatory fat stranding. Correlation with urinalysis is  recommended as sequelae associated with acute pyelonephritis cannot be excluded. 6. Aortic atherosclerosis. Electronically Signed   By: Virgle Grime M.D.   On: 07/16/2023 19:58   CT ABDOMEN PELVIS W CONTRAST Result Date: 07/16/2023 CLINICAL DATA:  Abdominal/flank pain, stone suspected LLQ abdominal pain EXAM: CT ABDOMEN AND PELVIS WITH CONTRAST TECHNIQUE: Multidetector CT imaging of the abdomen and pelvis was performed using the standard protocol following bolus administration of intravenous contrast. RADIATION DOSE REDUCTION: This exam was performed according to the departmental dose-optimization program which includes automated exposure control, adjustment of the mA and/or kV according to patient size and/or use of iterative reconstruction technique. CONTRAST:  100mL OMNIPAQUE  IOHEXOL  350 MG/ML SOLN COMPARISON:  Concurrent chest CTA, reported separately. Abdominopelvic CT 02/08/2023 FINDINGS: Lower chest: Assessed fully on concurrent chest CT, reported separately. Hepatobiliary: Tiny cyst in the left lobe of the liver needs no further imaging follow-up. Focal fatty infiltration adjacent to the falciform ligament. No suspicious liver lesion. Gallbladder physiologically distended, no calcified stone. No biliary dilatation. Pancreas: Mild parenchymal atrophy. No ductal dilatation or inflammation. Spleen: Normal in size without focal abnormality. Adrenals/Urinary Tract: Bilateral adrenal nodules, 4.1 x 2.6 cm on the right and 4.5 x 4.4 cm on the left. These were low-density on prior exam, typical of head no mass. No change from 2020 exam consistent with benign adenomas. No hydronephrosis or renal calculi. Symmetric bilateral perinephric stranding, chronic. Bilateral renal cysts. No further follow-up imaging is recommended. Partially distended urinary bladder, no bladder wall thickening. Stomach/Bowel: The stomach is nondistended. There is no bowel obstruction or inflammatory change. Normal appendix. Small to  moderate colonic stool burden. Left colonic diverticulosis without diverticulitis. Vascular/Lymphatic: Advanced aortic and branch atherosclerosis. No aortic aneurysm. Patent portal vein. No abdominopelvic adenopathy. Reproductive: Mild endometrial thickening, recently assessed with pelvic ultrasound. No adnexal mass. Other: No free air, free fluid, or intra-abdominal fluid collection. Minimal fat containing umbilical hernia. Musculoskeletal: Bilateral hip degenerative change. Scoliosis and degenerative change throughout the spine. Stable benign-appearing lucencies in the spine. IMPRESSION: 1. No acute abnormality in the abdomen/pelvis. 2. Left colonic diverticulosis without diverticulitis. 3. Stable bilateral adrenal adenomas. 4. Please reference concurrent chest CT for lung base findings. Aortic Atherosclerosis (ICD10-I70.0). Electronically Signed   By: Chadwick Colonel M.D.   On: 07/16/2023 19:57   DG Chest Portable 1 View Result Date: 07/16/2023 CLINICAL DATA:  Left-sided rib/chest pain. EXAM: PORTABLE CHEST 1 VIEW COMPARISON:  Radiograph 08/17/2022. FINDINGS: Normal heart size for technique. Stable mediastinal contours. Aortic atherosclerosis. Patchy airspace disease in the left infrahilar lung. Chronic blunting of the left costophrenic angle typical of scarring. No significant pleural effusion. No pneumothorax. Chronic right shoulder arthropathy. IMPRESSION: Patchy airspace disease in the left infrahilar lung, suspicious for pneumonia. Electronically Signed   By: Chadwick Colonel M.D.   On: 07/16/2023 18:33    Procedures Procedures    Medications Ordered  in ED Medications  ondansetron  (ZOFRAN ) injection 4 mg (has no administration in time range)  azithromycin (ZITHROMAX) 500 mg in sodium chloride  0.9 % 250 mL IVPB (500 mg Intravenous New Bag/Given 07/16/23 1956)  HYDROcodone -acetaminophen  (NORCO/VICODIN) 5-325 MG per tablet 1 tablet (1 tablet Oral Given 07/16/23 1847)  iohexol  (OMNIPAQUE ) 350 MG/ML  injection 100 mL (100 mLs Intravenous Contrast Given 07/16/23 1753)  ipratropium-albuterol  (DUONEB) 0.5-2.5 (3) MG/3ML nebulizer solution 3 mL (3 mLs Nebulization Given 07/16/23 1936)  cefTRIAXone (ROCEPHIN) 1 g in sodium chloride  0.9 % 100 mL IVPB (0 g Intravenous Stopped 07/16/23 2029)  albuterol  (PROVENTIL ) (2.5 MG/3ML) 0.083% nebulizer solution (2.5 mg  Given 07/16/23 1936)    ED Course/ Medical Decision Making/ A&P                          Medical Decision Making Amount and/or Complexity of Data Reviewed Labs: ordered. Decision-making details documented in ED Course. Radiology: ordered. Decision-making details documented in ED Course.  Risk Prescription drug management. Decision regarding hospitalization.    This patient presents to the ED for concern of flank pain, wheezing on exam, this involves an extensive number of treatment options, and is a complaint that carries with it a high risk of complications and morbidity.  I considered the following differential and admission for this acute, potentially life threatening condition.   MDM:    DDX for flank pain includes but is not limited to:  Given pain worse with deep breathing, consider highly respiratory causes such as lower lobe PNA, PE, or pleurisy. Also has h/o vulvar SCC s/p vulvectomy but is currently being worked up by gyn onc for possible endometrial thickening, so consider possible increased risk of PE as well.  With her wheezing on exam consider a COPD exacerbation as well the patient denies any known history of COPD.  She refuses a nebulizer when offered.  Her EKG does not demonstrate any signs of ischemia and her troponin is reassuringly negative as well, no concern for ACS.  Also consider: -Vascular causes such as abdominal aortic aneurysm or dissection, renal vein thrombosis, or mesenteric ischemia.  -GU causes such as pyelonephritis, nephrolithiasis/ureterolithiasis, perinephric abscess, RCC, obstructive uropathy, renal  infarction, ureteral blood clot or stricture, cystitis. GI causes such as perforated peptic ulcer, diverticulitis, malignancy.    Clinical Course as of 07/16/23 2110  Sun Jul 16, 2023  1758 Sodium(!): 128 +hyponatremia, new [HN]  1758 WBC(!): 22.0 +leukocytosis w/ left shift [HN]  1759 D-Dimer, Quant(!): 2.85 Significantly elevated dimer, pending CT PE [HN]  1759 Troponin I (High Sensitivity): 9 neg [HN]  1759 Hemoglobin(!): 15.7 Likely volume contraction [HN]  1829 CT Angio Chest PE W and/or Wo Contrast +LLL PNA on my read [HN]  1850 Urinalysis, w/ Reflex to Culture (Infection Suspected) -Urine, Clean Catch(!) Neg for UTI [HN]  1850 DG Chest Portable 1 View Patchy airspace disease in the left infrahilar lung, suspicious for pneumonia.   [HN]  2025 CT Angio Chest PE W and/or Wo Contrast 1. No evidence of pulmonary embolism. 2. Mild multifocal left upper lobe and moderate to marked severity posteromedial left lower lobe airspace disease, consistent with multifocal pneumonia. 3. Mild posterior right lower lobe atelectasis and/or infiltrate. 4. Bilateral low-attenuation adrenal masses, consistent with adrenal adenomas. 5. Bilateral nonspecific moderate to marked severity perinephric inflammatory fat stranding. Correlation with urinalysis is recommended as sequelae associated with acute pyelonephritis cannot be excluded. 6. Aortic atherosclerosis.   [HN]  2025 CT ABDOMEN  PELVIS W CONTRAST 1. No acute abnormality in the abdomen/pelvis. 2. Left colonic diverticulosis without diverticulitis. 3. Stable bilateral adrenal adenomas.   [HN]    Clinical Course User Index [HN] Merdis Stalling, MD     Patient is found to have multifocal pneumonia.  She eventually does agree to the nebulizer which she states helped potentially somewhat, though she states that moreso helped her began to cough some things up.  She is given azithromycin and ceftriaxone for her pneumonia.  She is  afebrile and overall nontoxic-appearing though does appear uncomfortable with the flank pain.  Her imaging does indicate possible pyelonephritis though her urine is negative reassuringly.  Discussed with patient and her daughter at bedside.  Discussed the need to follow-up with pulmonology as an outpatient once this acute pneumonia has resolved.  Patient is admitted to medicine.   Labs: I Ordered, and personally interpreted labs.  The pertinent results include:  chart review, daughter at bedside  Imaging Studies ordered: I ordered imaging studies including CT PE, CT abd pelvis I independently visualized and interpreted imaging. I agree with the radiologist interpretation  Additional history obtained from chart review.   Cardiac Monitoring: The patient was maintained on a cardiac monitor.  I personally viewed and interpreted the cardiac monitored which showed an underlying rhythm of: sinus tachycardia, NSR  Reevaluation: After the interventions noted above, I reevaluated the patient and found that they have :improved  Social Determinants of Health: Lives independently  Disposition: Admitted to medicine  Co morbidities that complicate the patient evaluation  Past Medical History:  Diagnosis Date   Arthritis    Atrial fibrillation (HCC)    Cancer (HCC)    Vaginal   COPD (chronic obstructive pulmonary disease) (HCC)    Depression    Difficult intubation    History of laryngospasm   Dysrhythmia    GERD (gastroesophageal reflux disease)    Hyperlipidemia    Hypertension    Numbness and tingling    Pulmonary embolism (HCC) 1973     Medicines Meds ordered this encounter  Medications   DISCONTD: morphine (PF) 4 MG/ML injection 4 mg    Refill:  0   ondansetron  (ZOFRAN ) injection 4 mg   HYDROcodone -acetaminophen  (NORCO/VICODIN) 5-325 MG per tablet 1 tablet    Refill:  0   iohexol  (OMNIPAQUE ) 350 MG/ML injection 100 mL   ipratropium-albuterol  (DUONEB) 0.5-2.5 (3) MG/3ML  nebulizer solution 3 mL   azithromycin (ZITHROMAX) 500 mg in sodium chloride  0.9 % 250 mL IVPB    Antibiotic Indication::   CAP   cefTRIAXone (ROCEPHIN) 1 g in sodium chloride  0.9 % 100 mL IVPB    Antibiotic Indication::   CAP   albuterol  (PROVENTIL ) (2.5 MG/3ML) 0.083% nebulizer solution    [facility] F: cabinet override    I have reviewed the patients home medicines and have made adjustments as needed  Problem List / ED Course: Problem List Items Addressed This Visit       Other   Hyponatremia   Other Visit Diagnoses       Left flank pain    -  Primary     Pneumonia of left lower lobe due to infectious organism       Relevant Medications   ipratropium-albuterol  (DUONEB) 0.5-2.5 (3) MG/3ML nebulizer solution 3 mL (Completed)   azithromycin (ZITHROMAX) 500 mg in sodium chloride  0.9 % 161 mL IVPB   cefTRIAXone (ROCEPHIN) 1 g in sodium chloride  0.9 % 100 mL IVPB (Completed)   albuterol  (PROVENTIL ) (  2.5 MG/3ML) 0.083% nebulizer solution (Completed)                   This note was created using dictation software, which may contain spelling or grammatical errors.    Merdis Stalling, MD 07/16/23 2113

## 2023-07-16 NOTE — H&P (Signed)
 History and Physical    Anglique Vivenzio WUJ:[id number] DOB: [birth year] DOA: 07/16/2023  PCP: Meldon Sport, MD   Patient coming from: Home  I have personally briefly reviewed patient's old medical records in [facility]  Chief Complaint: Difficulty Breathing  HPI: Ericca Zuhlke is a 73 y.o. female with medical history significant for atrial fibrillation, alcohol abuse, hypertension, vaginal cancer, COPD, pulmonary embolism. Patient presented to the ED with complaints of left-sided chest and flank pain that started yesterday.  Chest pain was present with deep breathing.  Otherwise she denies difficulty breathing.  She has a chronic unchanged cough.  She reports clammy feeling.  No lower extremity swelling, no vomiting no diarrhea.  No urinary frequency or pain.  ED Course: Temperature 97.7.  Heart rate 85-104.  Respiratory rate 16-20.  Blood pressure systolic 94-128.  O2 sats 91 to 99% on room air. WBC 22.  D-dimer elevated at 2.85.  Troponin 9.  UA clean.  COVID influenza RSV negative. CTA chest-negative for PE, left upper lobe left lower lobe, right lower lobe airspace disease/infiltrate consistent with multifocal pneumonia. CT abdomen and pelvis W contrast-  Bilateral nonspecific moderate to marked severity perinephric inflammatory fat stranding. Correlation with urinalysis is recommended as sequelae associated with acute pyelonephritis cannot be excluded.  IV ceftriaxone and azithromycin started. Norco given.  DuoNebs given.  Review of Systems: As per HPI all other systems reviewed and negative.  Past Medical History:  Diagnosis Date   Arthritis    Atrial fibrillation (HCC)    Cancer (HCC)    Vaginal   COPD (chronic obstructive pulmonary disease) (HCC)    Depression    Difficult intubation    History of laryngospasm   Dysrhythmia    GERD (gastroesophageal reflux disease)    Hyperlipidemia    Hypertension    Numbness and tingling    Pulmonary embolism (HCC) 1973     Past Surgical History:  Procedure Laterality Date   CERVIX LESION DESTRUCTION     throat   COLONOSCOPY WITH PROPOFOL  N/A 04/07/2015   SLF: 1. seven colorectal polyps removed 2. the left colon is redundant 3. small internal hemorrhoids (1 simple adenoma, 6 hyperplastic) . Repeat in 5 years.   CYST EXCISION Left    Arm   ESOPHAGOGASTRODUODENOSCOPY (EGD) WITH PROPOFOL  N/A 04/07/2015   SLF: 1. Schatzki ring 2. Bravo capsule 34 cm from the teeth 3. moderate non-erosive gastritis.    HARDWARE REMOVAL Right 12/05/2022   Procedure: RIGHT HUMERUS HARDWARE REMOVAL;  Surgeon: Tonita Frater, MD;  Location: [facility];  Service: Orthopedics;  Laterality: Right;  NEEDS RNFA   HUMERUS IM NAIL Right 06/25/2021   Procedure: INTRAMEDULLARY (IM) NAIL HUMERAL;  Surgeon: Tonita Frater, MD;  Location: [facility];  Service: Orthopedics;  Laterality: Right;   MASS EXCISION N/A 2002   Lanyx   MASS EXCISION Left 01/21/2015   Procedure: EXCISION CYST LEFT UPPER ARM;  Surgeon: Alanda Allegra, MD;  Location: [facility];  Service: General;  Laterality: Left;   MICROLARYNGOSCOPY N/A 05/31/2016   Procedure: MICRO LARYNGOSCOPY WITH BIOPSY OF VOCAL CORD LESION;  Surgeon: Reynold Caves, MD;  Location: [facility];  Service: ENT;  Laterality: N/A;   POLYPECTOMY N/A 04/07/2015   Procedure: POLYPECTOMY;  Surgeon: Alyce Jubilee, MD;  Location: [facility];  Service: Endoscopy;  Laterality: N/A;  Descending colon polyps x 3    SAVORY DILATION N/A 04/07/2015   Procedure: SAVORY DILATION;  Surgeon: Alyce Jubilee, MD;  Location:  [facility];  Service: Endoscopy;  Laterality: N/A;   VULVECTOMY N/A 07/12/2022   Procedure: SIMPLE PARTIAL VULVECTOMY, ADVANCEMENT FLAP;  Surgeon: Derrel Flies, MD;  Location: [facility];  Service: Gynecology;  Laterality: N/A;     reports that she has been smoking cigarettes. She has a 25 pack-year smoking history. She has been exposed to tobacco smoke. She has never used smokeless tobacco. She  reports current alcohol use. She reports that she does not currently use drugs after having used the following drugs: Marijuana.  Allergies  Allergen Reactions   Gabapentin  Other (See Comments)    dizziness   Tetracyclines & Related Other (See Comments)    Makes her sick on her stomach     Family History  Problem Relation Age of Onset   Pneumonia Mother    Aneurysm Father    Kidney cancer Sister    Lung cancer Sister    Esophageal cancer Brother    Melanoma Maternal Grandfather    Diabetes Daughter    Colon cancer Neg Hx    Breast cancer Neg Hx    Ovarian cancer Neg Hx    Endometrial cancer Neg Hx    Pancreatic cancer Neg Hx    Prostate cancer Neg Hx     Prior to Admission medications   Medication Sig Start Date End Date Taking? Authorizing Provider  amLODipine  (NORVASC ) 5 MG tablet Take 1 tablet (5 mg total) by mouth in the morning. 06/08/23  Yes Patel, Artie Bimler, MD  Apoaequorin (PREVAGEN PO) Take 1 tablet by mouth daily.   Yes [provider]  baclofen  (LIORESAL ) 10 MG tablet Take 1 tablet (10 mg total) by mouth daily as needed for muscle spasms (laryngospasm). 06/08/23  Yes Meldon Sport, MD  HYDROcodone -acetaminophen  (NORCO/VICODIN) 5-325 MG tablet Take 1 tablet by mouth every 12 (twelve) hours as needed for moderate pain (pain score 4-6) or severe pain (pain score 7-10). 06/23/23  Yes Meldon Sport, MD  lovastatin  (MEVACOR ) 20 MG tablet Take 1 tablet (20 mg total) by mouth in the morning. 06/08/23  Yes Meldon Sport, MD  olmesartan  (BENICAR ) 20 MG tablet Take 1 tablet (20 mg total) by mouth in the morning. 06/08/23  Yes Meldon Sport, MD  Propylene Glycol (SYSTANE COMPLETE OP) Place 1 drop into both eyes in the morning, at noon, and at bedtime.   Yes [provider]  venlafaxine  XR (EFFEXOR -XR) 150 MG 24 hr capsule Take 2 capsules (300 mg total) by mouth every evening. Patient taking differently: Take 150 mg by mouth daily. 06/30/21  Yes Marilyne Shu, NP   celecoxib  (CELEBREX ) 200 MG capsule Take 1 capsule (200 mg total) by mouth 2 (two) times daily. Patient not taking: Reported on 07/16/2023 03/21/23   Grooms, Courtney, PA-C  lidocaine  (XYLOCAINE ) 2 % jelly Apply 1 Application topically as needed. 11/21/22   Derrel Flies, MD    Physical Exam: Vitals:   07/16/23 2015 07/16/23 2030 07/16/23 2045 07/16/23 2052  BP: 123/85 121/79 112/69   Pulse: (!) 104 97 100   Resp:   17   Temp:    98.3 F (36.8 C)  TempSrc:    Oral  SpO2: 93% 93% 93%   Weight:      Height:        Constitutional: NAD, calm, comfortable Vitals:   07/16/23 2015 07/16/23 2030 07/16/23 2045 07/16/23 2052  BP: 123/85 121/79 112/69   Pulse: (!) 104 97 100   Resp:   17  Temp:    98.3 F (36.8 C)  TempSrc:    Oral  SpO2: 93% 93% 93%   Weight:      Height:       Eyes: PERRL, lids and conjunctivae normal ENMT: Mucous membranes are moist.   Neck: normal, supple, no masses, no thyromegaly Respiratory: clear to auscultation bilaterally, no wheezing, no crackles. Normal respiratory effort. No accessory muscle use.  Cardiovascular: Regular rate and rhythm, no murmurs / rubs / gallops. No extremity edema.  Extremities warm.   Abdomen: no tenderness, no masses palpated. No hepatosplenomegaly.   Musculoskeletal: no clubbing / cyanosis. No joint deformity upper and lower extremities.  Skin: no rashes, lesions, ulcers. No induration Neurologic: No facial asymmetry, moving extremities spontaneously, speech fluent. Psychiatric: Normal judgment and insight. Alert and oriented x 3. Normal mood.   Labs on Admission: I have personally reviewed following labs and imaging studies  CBC: Recent Labs  Lab 07/16/23 1716  WBC 22.0*  NEUTROABS 17.4*  HGB 15.7*  HCT 44.4  MCV 95.3  PLT 217   Basic Metabolic Panel: Recent Labs  Lab 07/16/23 1716  NA 128*  K 3.6  CL 97*  CO2 21*  GLUCOSE 104*  BUN 13  CREATININE 0.84  CALCIUM 8.9   GFR: Estimated Creatinine  Clearance: 55.8 mL/min (by C-G formula based on SCr of 0.84 mg/dL). Liver Function Tests: Recent Labs  Lab 07/16/23 1716  AST 16  ALT 14  ALKPHOS 88  BILITOT 0.9  PROT 6.4*  ALBUMIN 2.9*   Urine analysis:    Component Value Date/Time   COLORURINE YELLOW 07/16/2023 1823   APPEARANCEUR HAZY (A) 07/16/2023 1823   APPEARANCEUR Cloudy (A) 06/08/2023 1016   LABSPEC 1.018 07/16/2023 1823   PHURINE 6.0 07/16/2023 1823   GLUCOSEU NEGATIVE 07/16/2023 1823   HGBUR SMALL (A) 07/16/2023 1823   BILIRUBINUR NEGATIVE 07/16/2023 1823   BILIRUBINUR Negative 06/08/2023 1016   KETONESUR NEGATIVE 07/16/2023 1823   PROTEINUR NEGATIVE 07/16/2023 1823   NITRITE NEGATIVE 07/16/2023 1823   LEUKOCYTESUR NEGATIVE 07/16/2023 1823    Radiological Exams on Admission: CT Angio Chest PE W and/or Wo Contrast Result Date: 07/16/2023 CLINICAL DATA:  Left-sided chest pain. EXAM: CT ANGIOGRAPHY CHEST WITH CONTRAST TECHNIQUE: Multidetector CT imaging of the chest was performed using the standard protocol during bolus administration of intravenous contrast. Multiplanar CT image reconstructions and MIPs were obtained to evaluate the vascular anatomy. RADIATION DOSE REDUCTION: This exam was performed according to the departmental dose-optimization program which includes automated exposure control, adjustment of the mA and/or kV according to patient size and/or use of iterative reconstruction technique. CONTRAST:  OMNIPAQUE  IOHEXOL  350 MG/ML SOLN COMPARISON:  April 28, 2016 FINDINGS: Cardiovascular: There is marked severity calcification of the thoracic aorta, without evidence of aortic aneurysm satisfactory opacification of the pulmonary arteries to the segmental level. No evidence of pulmonary embolism. Normal heart size with marked severity coronary artery calcification. No pericardial effusion. Mediastinum/Nodes: No enlarged mediastinal, hilar, or axillary lymph nodes. Thyroid gland, trachea, and esophagus  demonstrate no significant findings. Lungs/Pleura: Mild multifocal left upper lobe and moderate to marked severity posteromedial left lower lobe airspace disease is seen. Mild posterior right lower lobe atelectasis and/or infiltrate is also noted. There is a stable, likely benign 8 mm noncalcified pulmonary nodule seen within the posterior aspect of the right lower lobe (axial CT image 70, CT series 4). No pleural effusion or pneumothorax is identified. Upper Abdomen: A 4.4 cm low-attenuation right adrenal mass is seen (approximately  1.27 Hounsfield units). An additional 4.7 cm low-attenuation left adrenal mass is noted (approximately -2.49 Hounsfield units). There is bilateral nonspecific moderate to marked severity perinephric inflammatory fat stranding. Musculoskeletal: No chest wall abnormality. No acute or significant osseous findings. Review of the MIP images confirms the above findings. IMPRESSION: 1. No evidence of pulmonary embolism. 2. Mild multifocal left upper lobe and moderate to marked severity posteromedial left lower lobe airspace disease, consistent with multifocal pneumonia. 3. Mild posterior right lower lobe atelectasis and/or infiltrate. 4. Bilateral low-attenuation adrenal masses, consistent with adrenal adenomas. 5. Bilateral nonspecific moderate to marked severity perinephric inflammatory fat stranding. Correlation with urinalysis is recommended as sequelae associated with acute pyelonephritis cannot be excluded. 6. Aortic atherosclerosis. Electronically Signed   By: Virgle Grime M.D.   On: 07/16/2023 19:58   CT ABDOMEN PELVIS W CONTRAST Result Date: 07/16/2023 CLINICAL DATA:  Abdominal/flank pain, stone suspected LLQ abdominal pain EXAM: CT ABDOMEN AND PELVIS WITH CONTRAST TECHNIQUE: Multidetector CT imaging of the abdomen and pelvis was performed using the standard protocol following bolus administration of intravenous contrast. RADIATION DOSE REDUCTION: This exam was performed  according to the departmental dose-optimization program which includes automated exposure control, adjustment of the mA and/or kV according to patient size and/or use of iterative reconstruction technique. CONTRAST:  OMNIPAQUE  IOHEXOL  350 MG/ML SOLN COMPARISON:  Concurrent chest CTA, reported separately. Abdominopelvic CT 02/08/2023 FINDINGS: Lower chest: Assessed fully on concurrent chest CT, reported separately. Hepatobiliary: Tiny cyst in the left lobe of the liver needs no further imaging follow-up. Focal fatty infiltration adjacent to the falciform ligament. No suspicious liver lesion. Gallbladder physiologically distended, no calcified stone. No biliary dilatation. Pancreas: Mild parenchymal atrophy. No ductal dilatation or inflammation. Spleen: Normal in size without focal abnormality. Adrenals/Urinary Tract: Bilateral adrenal nodules, 4.1 x 2.6 cm on the right and 4.5 x 4.4 cm on the left. These were low-density on prior exam, typical of head no mass. No change from 2020 exam consistent with benign adenomas. No hydronephrosis or renal calculi. Symmetric bilateral perinephric stranding, chronic. Bilateral renal cysts. No further follow-up imaging is recommended. Partially distended urinary bladder, no bladder wall thickening. Stomach/Bowel: The stomach is nondistended. There is no bowel obstruction or inflammatory change. Normal appendix. Small to moderate colonic stool burden. Left colonic diverticulosis without diverticulitis. Vascular/Lymphatic: Advanced aortic and branch atherosclerosis. No aortic aneurysm. Patent portal vein. No abdominopelvic adenopathy. Reproductive: Mild endometrial thickening, recently assessed with pelvic ultrasound. No adnexal mass. Other: No free air, free fluid, or intra-abdominal fluid collection. Minimal fat containing umbilical hernia. Musculoskeletal: Bilateral hip degenerative change. Scoliosis and degenerative change throughout the spine. Stable benign-appearing  lucencies in the spine. IMPRESSION: 1. No acute abnormality in the abdomen/pelvis. 2. Left colonic diverticulosis without diverticulitis. 3. Stable bilateral adrenal adenomas. 4. Please reference concurrent chest CT for lung base findings. Aortic Atherosclerosis (ICD10-I70.0). Electronically Signed   By: Chadwick Colonel M.D.   On: 07/16/2023 19:57   DG Chest Portable 1 View Result Date: 07/16/2023 CLINICAL DATA:  Left-sided rib/chest pain. EXAM: PORTABLE CHEST 1 VIEW COMPARISON:  Radiograph 08/17/2022. FINDINGS: Normal heart size for technique. Stable mediastinal contours. Aortic atherosclerosis. Patchy airspace disease in the left infrahilar lung. Chronic blunting of the left costophrenic angle typical of scarring. No significant pleural effusion. No pneumothorax. Chronic right shoulder arthropathy. IMPRESSION: Patchy airspace disease in the left infrahilar lung, suspicious for pneumonia. Electronically Signed   By: Chadwick Colonel M.D.   On: 07/16/2023 18:33   EKG: Independently reviewed.  Sinus rhythm, rate  92, QTc 416.  No significant change from prior.  Assessment/Plan Principal Problem:   Multifocal pneumonia Active Problems:   Sepsis (HCC)   Hyponatremia   Essential hypertension   Tobacco abuse   Paroxysmal A-fib (HCC)  Assessment and Plan: * Multifocal pneumonia Multifocal pneumonia with sepsis.  Not hypoxic.  CTA chest negative for PE, findings consistent with multifocal pneumonia. -IV ceftriaxone and azithromycin -Nebs, mucolytics as needed - Hydrocodone  as needed  Sepsis (HCC) Sepsis due to multifocal pneumonia.  Meeting sepsis criteria with leukocytosis of 22, tachycardia heart rate 85-104.  CT abdomen shows bilateral nonspecific moderate to severe perinephric stranding.  But UA not suggestive of infection, and she denies urinary symptoms. -IV antibiotics, cephalosporin should cover possible infectious etiology for perinephric stranding (no recent urine cultures). - N/s  100cc/hr x 20hrs -Obtain lactic acid -Obtain blood cultures (Antibiotics given)  Hyponatremia Sodium 128.  Baseline mid 130s. - hydrate  Paroxysmal A-fib (HCC) A-fib documented in 2023,.  In the setting of right humeral fracture.  Not started on anticoagulation due to history of alcohol abuse.  EKG currently shows sinus rhythm.  Not on rate limiting drugs.  Essential hypertension Stable. -Hold olmesartan  for contrast exposure -resume Norvasc  in a.m.   DVT prophylaxis: Lovenox Code Status: Full  Family Communication: Daughter- Burdette Carolin and Kim's Husband Athena Bland at bedside. Disposition Plan: ~/> 2 days Consults called:  None  Admission status: Inpt Tele I certify that at the point of admission it is my clinical judgment that the patient will require inpatient hospital care spanning beyond 2 midnights from the point of admission due to high intensity of service, high risk for further deterioration and high frequency of surveillance required.  Author: Pati Bonine, MD 07/16/2023 10:10 PM  For on call review [email or web address].

## 2023-07-16 NOTE — ED Notes (Signed)
 Patient transported to CT

## 2023-07-16 NOTE — Assessment & Plan Note (Signed)
 Stable. -Hold olmesartan  for contrast exposure -resume Norvasc  in a.m.

## 2023-07-16 NOTE — ED Notes (Signed)
 RT called to adm nebulizer

## 2023-07-16 NOTE — Assessment & Plan Note (Addendum)
 Multifocal pneumonia with sepsis.  Not hypoxic.  CTA chest negative for PE, findings consistent with multifocal pneumonia. -IV ceftriaxone and azithromycin -Nebs, mucolytics as needed - Hydrocodone  as needed

## 2023-07-16 NOTE — Assessment & Plan Note (Signed)
 Sodium 128.  Baseline mid 130s. - hydrate

## 2023-07-16 NOTE — ED Notes (Signed)
 Date and time results received: 07/16/23 2241 (use smartphrase ".now" to insert current time)  Test: Lactic Acid Critical Value: 3.7  Name of Provider Notified: Debroah Fanning, MD

## 2023-07-16 NOTE — Assessment & Plan Note (Signed)
 Sepsis due to multifocal pneumonia.  Meeting sepsis criteria with leukocytosis of 22, tachycardia heart rate 85-104.  CT abdomen shows bilateral nonspecific moderate to severe perinephric stranding.  But UA not suggestive of infection, and she denies urinary symptoms. -IV antibiotics, cephalosporin should cover possible infectious etiology for perinephric stranding (no recent urine cultures). - N/s 100cc/hr x 20hrs -Obtain lactic acid -Obtain blood cultures (Antibiotics given)

## 2023-07-16 NOTE — ED Triage Notes (Signed)
 Pt with left chest pain with deep breaths since last night.  Denies any injuries. + N/V due to GI issues per pt.

## 2023-07-16 NOTE — Progress Notes (Deleted)
 GI Office Note    Referring Provider: Meldon Sport, MD Primary Care Physician:  Meldon Sport, MD Primary Gastroenterologist: Rolando Cliche. Mordechai April, DO  Date:  07/16/2023  ID:  Aloma Arrant, DOB [birth year], MRN [id number]   Chief Complaint   No chief complaint on file.   History of Present Illness  Laura Mcgee is a 73 y.o. female with a history of depression, A-fib, COPD, HLD, HTN, PE 1973, GERD, laryngeal spasm, and chronic RUQ pain presenting today with complaint of ***  EGD and colonoscopy January 2017. She had a Schatzki ring, Bravo capsule placed, moderate nonerosive gastritis. Bravo study showed nonacid reflux on PPI. 7 colorectal polyps removed, one was a simple adenoma the others hyperplastic. Left colon redundant. Small internal hemorrhoids. Next colonoscopy 5 years (2022).      OV 05/03/22.  Has urge use bathroom in the mornings has a commode for a while but then unable to go.  States this initially hard to push out stools and then her stools will be large.  Continues to have the urgency.  Denies eating during the day, admits to eating after 6 PM as this is a chronic habit.  When eating at night she has pain and nausea.  Sometimes when she does eat she has nausea and pain usually occurs in the right upper quadrant and is intermittent.  Declined colonoscopy.  Denied any melena or BRBPR.  Reported 10 pound weight loss within the prior 6 weeks and decreased appetite.  Offered Cologuard as she refused colonoscopy.  Advise she may benefit from a an upper endoscopy.  Check LFTs and order right upper quadrant ultrasound.  Advised Colace twice daily and MiraLAX nightly.   RUQ US  05/11/22: -Positive Murphy sign noted by sonographer -CBD measuring 4 mm -Fatty liver -Advised CT scan of the abdomen pelvis if she did not want to try pantoprazole  or omeprazole .   OV 01/16/23.  Complaints of lack of appetite.  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  Having pain  in the right upper quadrant mostly and comes and goes and at times recently has felt more frequent.  No vomiting.  Only eating half meals that she prepares.  Drinks milk with every meal.  Does not like chicken or [place], primarily eating fish, sometimes pork or beef.  Has been feeling the need to strain but at times will have urgency and the pain goes away.  Taking senna docusate sodium  combo at times, try not to be dependent on it.  Has not tried any MiraLAX.  Did not feel as though pantoprazole  or omeprazole  were helpful. Advised to start famotidine 20 mg once daily. Miralax nightly. GERD diet. Recommended protein shakes. CT A/P ordered. Advised will re discuss colonoscopy and EGD for evaluation of weight loss.    CT A/P 02/08/23 IMPRESSION: No evidence of biliary obstruction. Endometrial thickening or fluid within the endometrial canal, abnormal for postmenopausal state. Recommend pelvic ultrasound for further evaluation. 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 No suspicious renal mass, stones or hydronephrosis.  -Advised follow up with GYN   Today:    Wt Readings from Last 3 Encounters:  06/26/23 151 lb (68.5 kg)  06/08/23 152 lb 9.6 oz (69.2 kg)  05/29/23 152 lb (68.9 kg)    Current Outpatient Medications  Medication Sig Dispense Refill   amLODipine  (NORVASC ) 5 MG tablet Take 1 tablet (5 mg total) by mouth in the morning. 90 tablet 1   baclofen  (LIORESAL )  10 MG tablet Take 1 tablet (10 mg total) by mouth daily as needed for muscle spasms (laryngospasm).     celecoxib  (CELEBREX ) 200 MG capsule Take 1 capsule (200 mg total) by mouth 2 (two) times daily. 60 capsule 3   HYDROcodone -acetaminophen  (NORCO/VICODIN) 5-325 MG tablet Take 1 tablet by mouth every 12 (twelve) hours as needed for moderate pain (pain score 4-6) or severe pain (pain score 7-10). 60 tablet 0   lidocaine  (XYLOCAINE ) 2 % jelly Apply 1 Application topically as needed. 30 mL 3    lovastatin  (MEVACOR ) 20 MG tablet Take 1 tablet (20 mg total) by mouth in the morning. 90 tablet 1   olmesartan  (BENICAR ) 20 MG tablet Take 1 tablet (20 mg total) by mouth in the morning. 90 tablet 1   Propylene Glycol (SYSTANE COMPLETE OP) Place 1 drop into both eyes in the morning, at noon, and at bedtime.     venlafaxine  XR (EFFEXOR -XR) 150 MG 24 hr capsule Take 2 capsules (300 mg total) by mouth every evening. (Patient taking differently: Take 150 mg by mouth in the morning and at bedtime.) 60 capsule 0   No current facility-administered medications for this visit.    Past Medical History:  Diagnosis Date   Arthritis    Atrial fibrillation (HCC)    Cancer (HCC)    Vaginal   COPD (chronic obstructive pulmonary disease) (HCC)    Depression    Difficult intubation    History of laryngospasm   Dysrhythmia    GERD (gastroesophageal reflux disease)    Hyperlipidemia    Hypertension    Numbness and tingling    Pulmonary embolism (HCC) 1973    Past Surgical History:  Procedure Laterality Date   CERVIX LESION DESTRUCTION     throat   COLONOSCOPY WITH PROPOFOL  N/A 04/07/2015   SLF: 1. seven colorectal polyps removed 2. the left colon is redundant 3. small internal hemorrhoids (1 simple adenoma, 6 hyperplastic) . Repeat in 5 years.   CYST EXCISION Left    Arm   ESOPHAGOGASTRODUODENOSCOPY (EGD) WITH PROPOFOL  N/A 04/07/2015   SLF: 1. Schatzki ring 2. Bravo capsule 34 cm from the teeth 3. moderate non-erosive gastritis.    HARDWARE REMOVAL Right 12/05/2022   Procedure: RIGHT HUMERUS HARDWARE REMOVAL;  Surgeon: Tonita Frater, MD;  Location: [facility];  Service: Orthopedics;  Laterality: Right;  NEEDS RNFA   HUMERUS IM NAIL Right 06/25/2021   Procedure: INTRAMEDULLARY (IM) NAIL HUMERAL;  Surgeon: Tonita Frater, MD;  Location: [facility];  Service: Orthopedics;  Laterality: Right;   MASS EXCISION N/A 2002   Lanyx   MASS EXCISION Left 01/21/2015   Procedure: EXCISION CYST LEFT UPPER ARM;   Surgeon: Alanda Allegra, MD;  Location: [facility];  Service: General;  Laterality: Left;   MICROLARYNGOSCOPY N/A 05/31/2016   Procedure: MICRO LARYNGOSCOPY WITH BIOPSY OF VOCAL CORD LESION;  Surgeon: Reynold Caves, MD;  Location: [facility];  Service: ENT;  Laterality: N/A;   POLYPECTOMY N/A 04/07/2015   Procedure: POLYPECTOMY;  Surgeon: Alyce Jubilee, MD;  Location: [facility];  Service: Endoscopy;  Laterality: N/A;  Descending colon polyps x 3    SAVORY DILATION N/A 04/07/2015   Procedure: SAVORY DILATION;  Surgeon: Alyce Jubilee, MD;  Location: [facility];  Service: Endoscopy;  Laterality: N/A;   VULVECTOMY N/A 07/12/2022   Procedure: SIMPLE PARTIAL VULVECTOMY, ADVANCEMENT FLAP;  Surgeon: Derrel Flies, MD;  Location: [facility];  Service: Gynecology;  Laterality: N/A;  Family History  Problem Relation Age of Onset   Pneumonia Mother    Aneurysm Father    Kidney cancer Sister    Lung cancer Sister    Esophageal cancer Brother    Melanoma Maternal Grandfather    Diabetes Daughter    Colon cancer Neg Hx    Breast cancer Neg Hx    Ovarian cancer Neg Hx    Endometrial cancer Neg Hx    Pancreatic cancer Neg Hx    Prostate cancer Neg Hx     Allergies as of 07/17/2023 - Review Complete 07/10/2023  Allergen Reaction Noted   Gabapentin  Other (See Comments) 05/22/2019   Tetracyclines & related Other (See Comments) 01/15/2015    Social History   Socioeconomic History   Marital status: Single    Spouse name: Not on file   Number of children: 1   Years of education: some college   Highest education level: Not on file  Occupational History   Occupation: Retired  Tobacco Use   Smoking status: Every Day    Current packs/day: 1.00    Average packs/day: 1 pack/day for 25.0 years (25.0 ttl pk-yrs)    Types: Cigarettes    Passive exposure: Current   Smokeless tobacco: Never   Tobacco comments:    1 ppd as of 06/08/23  Vaping Use   Vaping status: Never Used  Substance  and Sexual Activity   Alcohol use: Yes    Comment: Drinks two beer per night.   Drug use: Not Currently    Types: Marijuana    Comment: daily marijuana use, not since 03/2022   Sexual activity: Not Currently    Birth control/protection: Post-menopausal  Other Topics Concern   Not on file  Social History Narrative   Lives at home alone.   Right-handed.   Two cups sweet tea daily.   Social Drivers of Corporate investment banker Strain: Low Risk  (11/30/2022)   Received from [facility]   Overall Financial Resource Strain (CARDIA)    Difficulty of Paying Living Expenses: Not hard at all  Food Insecurity: No Food Insecurity (11/30/2022)   [facility] from [facility]   Hunger Vital Sign    Worried About Running Out of Food in the Last Year: Never true    Ran Out of Food in the Last Year: Never true  Transportation Needs: No Transportation Needs (11/30/2022)   [facility] - Transportation    Lack of Transportation (Medical): No    Lack of Transportation (Non-Medical): No  Physical Activity: Sufficiently Active (11/30/2022)   Received from [facility]   Exercise Vital Sign    Days of Exercise per Week: 5 days    Minutes of Exercise per Session: 30 min  Stress: No Stress Concern Present (11/30/2022)   Received from [facility] - Occupational Stress Questionnaire    Feeling of Stress : Not at all  Social Connections: Moderately Integrated (11/30/2022)   Received from [facility]   Social Connection and Isolation Panel [NHANES]    Frequency of Communication with Friends and Family: Three times a week    Frequency of Social Gatherings with Friends and Family: Three times a week    Attends Religious Services: 1 to 4 times per year    Active Member of Clubs or Organizations: No    Attends Banker Meetings: 1 to 4 times per year  Marital Status: Never married     Review of Systems    Gen: Denies fever, chills, anorexia. Denies fatigue, weakness, weight loss.  CV: Denies chest pain, palpitations, syncope, peripheral edema, and claudication. Resp: Denies dyspnea at rest, cough, wheezing, coughing up blood, and pleurisy. GI: See HPI Derm: Denies rash, itching, dry skin Psych: Denies depression, anxiety, memory loss, confusion. No homicidal or suicidal ideation.  Heme: Denies bruising, bleeding, and enlarged lymph nodes.  Physical Exam   LMP 06/20/1997 (Approximate) Comment: post menopausal  General:   Alert and oriented. No distress noted. Pleasant and cooperative.  Head:  Normocephalic and atraumatic. Eyes:  Conjuctiva clear without scleral icterus. Mouth:  Oral mucosa pink and moist. Good dentition. No lesions. Lungs:  Clear to auscultation bilaterally. No wheezes, rales, or rhonchi. No distress.  Heart:  S1, S2 present without murmurs appreciated.  Abdomen:  +BS, soft, non-tender and non-distended. No rebound or guarding. No HSM or masses noted. Rectal: *** Msk:  Symmetrical without gross deformities. Normal posture. Extremities:  Without edema. Neurologic:  Alert and  oriented x4 Psych:  Alert and cooperative. Normal mood and affect.  Assessment  Laura Mcgee is a 73 y.o. female with a history of depression, A-fib, COPD, HLD, HTN, PE 1973, GERD, laryngeal spasm, and chronic RUQ pain presenting today for follow up with complaint of ***    Constipation:   Nausea, weight loss:   RUQ pain;   History of colon polyps:   PLAN   ***     Julian Obey, MSN, FNP-BC, AGACNP-BC [facility]

## 2023-07-17 ENCOUNTER — Ambulatory Visit: Admitting: Gastroenterology

## 2023-07-17 DIAGNOSIS — I10 Essential (primary) hypertension: Secondary | ICD-10-CM | POA: Diagnosis not present

## 2023-07-17 DIAGNOSIS — A419 Sepsis, unspecified organism: Secondary | ICD-10-CM | POA: Diagnosis not present

## 2023-07-17 DIAGNOSIS — I480 Paroxysmal atrial fibrillation: Secondary | ICD-10-CM | POA: Diagnosis not present

## 2023-07-17 DIAGNOSIS — E876 Hypokalemia: Secondary | ICD-10-CM

## 2023-07-17 DIAGNOSIS — J189 Pneumonia, unspecified organism: Secondary | ICD-10-CM | POA: Diagnosis not present

## 2023-07-17 LAB — CBC
HCT: 42.7 % (ref 36.0–46.0)
Hemoglobin: 14.6 g/dL (ref 12.0–15.0)
MCH: 32.4 pg (ref 26.0–34.0)
MCHC: 34.2 g/dL (ref 30.0–36.0)
MCV: 94.7 fL (ref 80.0–100.0)
Platelets: 179 10*3/uL (ref 150–400)
RBC: 4.51 MIL/uL (ref 3.87–5.11)
RDW: 14.5 % (ref 11.5–15.5)
WBC: 16.1 10*3/uL — ABNORMAL HIGH (ref 4.0–10.5)
nRBC: 0 % (ref 0.0–0.2)

## 2023-07-17 LAB — BASIC METABOLIC PANEL WITH GFR
Anion gap: 9 (ref 5–15)
BUN: 12 mg/dL (ref 8–23)
CO2: 20 mmol/L — ABNORMAL LOW (ref 22–32)
Calcium: 8.5 mg/dL — ABNORMAL LOW (ref 8.9–10.3)
Chloride: 103 mmol/L (ref 98–111)
Creatinine, Ser: 0.65 mg/dL (ref 0.44–1.00)
GFR, Estimated: >60 mL/min (ref >60)
Glucose, Bld: 107 mg/dL — ABNORMAL HIGH (ref 70–99)
Potassium: 3.3 mmol/L — ABNORMAL LOW (ref 3.5–5.1)
Sodium: 132 mmol/L — ABNORMAL LOW (ref 135–145)

## 2023-07-17 LAB — LACTIC ACID, PLASMA: Lactic Acid, Venous: 1.6 mmol/L (ref 0.5–1.9)

## 2023-07-17 LAB — MRSA NEXT GEN BY PCR, NASAL: MRSA by PCR Next Gen: NOT DETECTED

## 2023-07-17 MED ORDER — AMIODARONE HCL IN DEXTROSE 360-4.14 MG/200ML-% IV SOLN
60.0000 mg/h | INTRAVENOUS | Status: DC
  Administered 2023-07-17 (×2): 60 mg/h via INTRAVENOUS

## 2023-07-17 MED ORDER — AMIODARONE LOAD VIA INFUSION
150.0000 mg | Freq: Once | INTRAVENOUS | Status: AC
  Administered 2023-07-17: 150 mg via INTRAVENOUS
  Filled 2023-07-17: qty 83.34

## 2023-07-17 MED ORDER — METOPROLOL TARTRATE 25 MG PO TABS
25.0000 mg | ORAL_TABLET | Freq: Two times a day (BID) | ORAL | Status: DC
  Administered 2023-07-18: 25 mg via ORAL
  Filled 2023-07-17: qty 1

## 2023-07-17 MED ORDER — MAGNESIUM SULFATE IN D5W 1-5 GM/100ML-% IV SOLN
1.0000 g | Freq: Once | INTRAVENOUS | Status: AC
  Administered 2023-07-17: 1 g via INTRAVENOUS
  Filled 2023-07-17: qty 100

## 2023-07-17 MED ORDER — BUDESONIDE 0.5 MG/2ML IN SUSP
0.5000 mg | Freq: Two times a day (BID) | RESPIRATORY_TRACT | Status: DC
  Administered 2023-07-17 – 2023-07-18 (×2): 0.5 mg via RESPIRATORY_TRACT
  Filled 2023-07-17 (×3): qty 2

## 2023-07-17 MED ORDER — AMIODARONE HCL IN DEXTROSE 360-4.14 MG/200ML-% IV SOLN
30.0000 mg/h | INTRAVENOUS | Status: DC
  Administered 2023-07-18: 30 mg/h via INTRAVENOUS
  Filled 2023-07-17 (×2): qty 200

## 2023-07-17 MED ORDER — LEVALBUTEROL HCL 0.63 MG/3ML IN NEBU: 0.6300 mg | INHALATION_SOLUTION | Freq: Three times a day (TID) | RESPIRATORY_TRACT | Status: DC | PRN

## 2023-07-17 MED ORDER — CHLORHEXIDINE GLUCONATE CLOTH 2 % EX PADS
6.0000 | MEDICATED_PAD | Freq: Every day | CUTANEOUS | Status: DC
  Administered 2023-07-18: 6 via TOPICAL

## 2023-07-17 MED ORDER — SODIUM CHLORIDE 0.9 % IV BOLUS
500.0000 mL | Freq: Once | INTRAVENOUS | Status: AC
  Administered 2023-07-17: 500 mL via INTRAVENOUS

## 2023-07-17 MED ORDER — METOPROLOL TARTRATE 5 MG/5ML IV SOLN
5.0000 mg | Freq: Once | INTRAVENOUS | Status: AC
  Administered 2023-07-17: 5 mg via INTRAVENOUS
  Filled 2023-07-17: qty 5

## 2023-07-17 MED ORDER — MIDODRINE HCL 5 MG PO TABS
5.0000 mg | ORAL_TABLET | Freq: Three times a day (TID) | ORAL | Status: DC
  Administered 2023-07-18 – 2023-07-19 (×5): 5 mg via ORAL
  Filled 2023-07-17 (×5): qty 1

## 2023-07-17 MED ORDER — POTASSIUM CHLORIDE CRYS ER 20 MEQ PO TBCR
40.0000 meq | EXTENDED_RELEASE_TABLET | Freq: Once | ORAL | Status: AC
  Administered 2023-07-17: 40 meq via ORAL
  Filled 2023-07-17 (×2): qty 2

## 2023-07-17 NOTE — TOC CM/SW Note (Signed)
 Transition of Care [facility]) - Inpatient Brief Assessment   Patient Details  Name: Laura Mcgee MRN: [id number] Date of Birth: 1950-07-03  [facility] CM/SW Contact:    Grandville Lax, LCSWA Phone Number: 07/17/2023, 10:13 AM   Clinical Narrative: Transition of Care [facility]) has reviewed patient and no TOC needs have been identified at this time. We will continue to monitor patient advancement through interdiciplinary progression rounds. If new patient transition needs arise, please place a TOC consult.   Transition of Care Asessment: Insurance and Status: Insurance coverage has been reviewed Patient has primary care physician: Yes Home environment has been reviewed: From home Prior level of function:: Independent Prior/Current Home Services: No current home services Social Drivers of Health Review: SDOH reviewed no interventions necessary Readmission risk has been reviewed: Yes Transition of care needs: no transition of care needs at this time

## 2023-07-17 NOTE — Progress Notes (Signed)
 Progress Note   Patient: [name] ZHY:[id number] DOB: [birth year] DOA: 07/16/2023     1 DOS: the patient was seen and examined on 07/17/2023   Brief hospital admission narrative: As per H&P written by Dr. Quintella Buck on 07/16/2023 Laura Mcgee is a 73 y.o. female with medical history significant for atrial fibrillation, alcohol abuse, hypertension, vaginal cancer, COPD, pulmonary embolism. Patient presented to the ED with complaints of left-sided chest and flank pain that started yesterday.  Chest pain was present with deep breathing.  Otherwise she denies difficulty breathing.  She has a chronic unchanged cough.  She reports clammy feeling.  No lower extremity swelling, no vomiting no diarrhea.  No urinary frequency or pain.   ED Course: Temperature 97.7.  Heart rate 85-104.  Respiratory rate 16-20.  Blood pressure systolic 94-128.  O2 sats 91 to 99% on room air. WBC 22.  D-dimer elevated at 2.85.  Troponin 9.  UA clean.  COVID influenza RSV negative. CTA chest-negative for PE, left upper lobe left lower lobe, right lower lobe airspace disease/infiltrate consistent with multifocal pneumonia. CT abdomen and pelvis W contrast-  Bilateral nonspecific moderate to marked severity perinephric inflammatory fat stranding. Correlation with urinalysis is recommended as sequelae associated with acute pyelonephritis cannot be excluded.   IV ceftriaxone and azithromycin started. Norco given.  DuoNebs given.  Assessment and plan Multifocal pneumonia -Multifocal pneumonia with sepsis.   -CTA chest negative for PE, findings consistent with multifocal pneumonia. - Continue bronchodilators, IV antibiotics and the use of flutter valve-maintain adequate hydration will follow clinical response - Currently afebrile and WBCs trending down.  Paroxysmal atrial fibrillation - Not on anticoagulation chronically - Will start treatment with beta-blockers - Repeat EKG and check 2D echo - Blood pressure  electrolytes as needed; goal for potassium above 4 and magnesium above 2 as much as possible.  hyponatremia/hypokalemia -Replete electrolytes as tolerated and follow trend - Continue telemetry monitoring.  Essential hypertension -Currently stable - Continue current antihypertensive agent - Heart healthy/low-sodium diet discussed with patient  Sepsis (HCC) -Sepsis due to multifocal pneumonia.   - Continue to follow clinical response - Continue IV antibiotics-maintain adequate hydration.  Lactic acidosis -Within normal limits after fluid resuscitation - Continue treatment for sepsis.   Subjective:  Reports feeling better and at time of evaluation not requiring oxygen supplementation.  Physical Exam: Vitals:   07/17/23 0645 07/17/23 0800 07/17/23 0905 07/17/23 1310  BP: 123/75 123/86 123/65 98/72  Pulse: 99 97 64 86  Resp: 15 15 16 20   Temp:   97.7 F (36.5 C)   TempSrc:   Oral   SpO2: 91% 92% 92% 92%  Weight:      Height:       General exam: Alert, awake, oriented x 3; no complaining of chest pain and expressed no nausea or vomiting. Respiratory system: Positive rhonchi bilaterally; mild expiratory wheezing on exam.  No using accessory muscle.  Good saturation on room air. Cardiovascular system: Irregular, no rubs, no gallops, no JVD. Gastrointestinal system: Abdomen is nondistended, soft and nontender. No organomegaly or masses felt. Normal bowel sounds heard. Central nervous system:  No focal neurological deficits. Extremities: No cyanosis or clubbing. Skin: No petechiae. Psychiatry: Judgement and insight appear normal. Mood & affect appropriate.    Data Reviewed: Basic metabolic panel: Sodium 132, potassium 3.3, chloride 103, bicarb 20, BUN 12, creatinine 0.65 and GFR >60 CBC: WBC 16.1, hemoglobin 14.6 and platelet count 179K Lactic acid:1.6  Family Communication: No family at bedside.  Disposition: Status  is: Inpatient Remains inpatient appropriate because:  Continue IV antibiotics.  Time spent: 50 minutes  Author: Justina Oman, MD 07/17/2023 4:57 PM  For on call review [email or web address].

## 2023-07-17 NOTE — ED Notes (Signed)
Daughter updated at this time.   

## 2023-07-17 NOTE — Plan of Care (Signed)
  Problem: Education: Goal: Knowledge of General Education information will improve Description: Including pain rating scale, medication(s)/side effects and non-pharmacologic comfort measures Outcome: Progressing   Problem: Health Behavior/Discharge Planning: Goal: Ability to manage health-related needs will improve Outcome: Progressing   Problem: Nutrition: Goal: Adequate nutrition will be maintained Outcome: Progressing   Problem: Elimination: Goal: Will not experience complications related to urinary retention Outcome: Progressing   

## 2023-07-18 ENCOUNTER — Other Ambulatory Visit (HOSPITAL_COMMUNITY): Payer: Self-pay

## 2023-07-18 ENCOUNTER — Telehealth (HOSPITAL_COMMUNITY): Payer: Self-pay | Admitting: Pharmacy Technician

## 2023-07-18 ENCOUNTER — Inpatient Hospital Stay (HOSPITAL_COMMUNITY)

## 2023-07-18 DIAGNOSIS — I4891 Unspecified atrial fibrillation: Secondary | ICD-10-CM | POA: Diagnosis not present

## 2023-07-18 DIAGNOSIS — A419 Sepsis, unspecified organism: Secondary | ICD-10-CM | POA: Diagnosis not present

## 2023-07-18 DIAGNOSIS — I10 Essential (primary) hypertension: Secondary | ICD-10-CM | POA: Diagnosis not present

## 2023-07-18 DIAGNOSIS — I480 Paroxysmal atrial fibrillation: Secondary | ICD-10-CM | POA: Diagnosis not present

## 2023-07-18 DIAGNOSIS — J189 Pneumonia, unspecified organism: Secondary | ICD-10-CM | POA: Diagnosis not present

## 2023-07-18 LAB — ECHOCARDIOGRAM COMPLETE
AR max vel: 2.87 cm2
AV Area VTI: 3.5 cm2
AV Area mean vel: 3.11 cm2
AV Mean grad: 1.5 mmHg
AV Peak grad: 3.2 mmHg
Ao pk vel: 0.89 m/s
Area-P 1/2: 4.96 cm2
Est EF: 50
Height: 66 in
MV VTI: 2.16 cm2
S' Lateral: 3.7 cm
Weight: 2578.5 [oz_av]

## 2023-07-18 LAB — BASIC METABOLIC PANEL WITH GFR
Anion gap: 8 (ref 5–15)
BUN: 15 mg/dL (ref 8–23)
CO2: 22 mmol/L (ref 22–32)
Calcium: 8.5 mg/dL — ABNORMAL LOW (ref 8.9–10.3)
Chloride: 105 mmol/L (ref 98–111)
Creatinine, Ser: 0.63 mg/dL (ref 0.44–1.00)
GFR, Estimated: >60 mL/min (ref >60)
Glucose, Bld: 104 mg/dL — ABNORMAL HIGH (ref 70–99)
Potassium: 3.7 mmol/L (ref 3.5–5.1)
Sodium: 135 mmol/L (ref 135–145)

## 2023-07-18 LAB — MAGNESIUM: Magnesium: 2.3 mg/dL (ref 1.7–2.4)

## 2023-07-18 MED ORDER — PERFLUTREN LIPID MICROSPHERE
1.0000 mL | INTRAVENOUS | Status: AC | PRN
  Administered 2023-07-18: 2 mL via INTRAVENOUS

## 2023-07-18 MED ORDER — FOLIC ACID 1 MG PO TABS
1.0000 mg | ORAL_TABLET | Freq: Every day | ORAL | Status: DC
  Administered 2023-07-18 – 2023-07-19 (×2): 1 mg via ORAL
  Filled 2023-07-18 (×2): qty 1

## 2023-07-18 MED ORDER — LORAZEPAM 2 MG/ML IJ SOLN: 1.0000 mg | INTRAMUSCULAR | Status: DC | PRN

## 2023-07-18 MED ORDER — THIAMINE MONONITRATE 100 MG PO TABS
100.0000 mg | ORAL_TABLET | Freq: Every day | ORAL | Status: DC
  Administered 2023-07-18 – 2023-07-19 (×2): 100 mg via ORAL
  Filled 2023-07-18 (×2): qty 1

## 2023-07-18 MED ORDER — DILTIAZEM HCL 60 MG PO TABS
60.0000 mg | ORAL_TABLET | Freq: Three times a day (TID) | ORAL | Status: DC
  Administered 2023-07-18 – 2023-07-19 (×4): 60 mg via ORAL
  Filled 2023-07-18 (×4): qty 1

## 2023-07-18 MED ORDER — METOPROLOL TARTRATE 50 MG PO TABS
50.0000 mg | ORAL_TABLET | Freq: Two times a day (BID) | ORAL | Status: DC
  Administered 2023-07-18 – 2023-07-19 (×2): 50 mg via ORAL
  Filled 2023-07-18 (×2): qty 1

## 2023-07-18 MED ORDER — ADULT MULTIVITAMIN W/MINERALS CH
1.0000 | ORAL_TABLET | Freq: Every day | ORAL | Status: DC
Start: 2023-07-18 — End: 2023-07-19
  Administered 2023-07-18 – 2023-07-19 (×2): 1 via ORAL
  Filled 2023-07-18 (×2): qty 1

## 2023-07-18 MED ORDER — THIAMINE HCL 100 MG/ML IJ SOLN
100.0000 mg | Freq: Every day | INTRAMUSCULAR | Status: DC
  Filled 2023-07-18: qty 2

## 2023-07-18 MED ORDER — LORAZEPAM 1 MG PO TABS
1.0000 mg | ORAL_TABLET | ORAL | Status: DC | PRN
  Administered 2023-07-18: 1 mg via ORAL
  Filled 2023-07-18 (×2): qty 1

## 2023-07-18 NOTE — Plan of Care (Signed)
  Problem: Acute Rehab PT Goals(only PT should resolve) Goal: Pt Will Go Supine/Side To Sit Outcome: Progressing Flowsheets (Taken 07/18/2023 1526) Pt will go Supine/Side to Sit: with supervision Goal: Patient Will Transfer Sit To/From Stand Outcome: Progressing Flowsheets (Taken 07/18/2023 1526) Patient will transfer sit to/from stand: with supervision Goal: Pt Will Transfer Bed To Chair/Chair To Bed Outcome: Progressing Flowsheets (Taken 07/18/2023 1526) Pt will Transfer Bed to Chair/Chair to Bed: with supervision Goal: Pt Will Ambulate Outcome: Progressing Flowsheets (Taken 07/18/2023 1526) Pt will Ambulate:  75 feet  with supervision  with contact guard assist  with least restrictive assistive device  with cane  with rolling walker   3:27 PM, 07/18/23 Walton Guppy, MPT Physical Therapist with [facility] [phone] office [phone] mobile phone

## 2023-07-18 NOTE — Progress Notes (Signed)
 Patient's daughter at bedside, Burdette Carolin informed that her mother drinks two 22oz beers daily and that she has done this as long as she can remember. Burdette Carolin also states, "this is probably why my mother is agitated and anxious right now" Patient is also sweating some. Current CIWA score is 4. Will continue to monitor.

## 2023-07-18 NOTE — Telephone Encounter (Signed)

## 2023-07-18 NOTE — Progress Notes (Signed)
 Patient has converted to NSR>

## 2023-07-18 NOTE — Evaluation (Signed)
 Physical Therapy Evaluation Patient Details Name: Laura Mcgee MRN: [id number] DOB: Aug 09, 1950 Today's Date: 07/18/2023  History of Present Illness  Laura Mcgee is a 73 y.o. female with medical history significant for atrial fibrillation, alcohol abuse, hypertension, vaginal cancer, COPD, pulmonary embolism.  Patient presented to the ED with complaints of left-sided chest and flank pain that started yesterday.  Chest pain was present with deep breathing.  Otherwise she denies difficulty breathing.  She has a chronic unchanged cough.  She reports clammy feeling.  No lower extremity swelling, no vomiting no diarrhea.  No urinary frequency or pain.   Clinical Impression  Patient has to lean on armrest of chair while using SPC, very unsteady on feet requiring Min hand held assist for maintaining standing balance and taking steps in room.  Patient limited mostly due to c/o fatigue, frequent coughing and SpO2 dropping from 92% to 85% with exertion and had to increase supplemental O2 to 3 LPM to keep over 88% - nurse aware. Patient tolerated sitting up in chair after therapy. Patient will benefit from continued skilled physical therapy in hospital and recommended venue below to increase strength, balance, endurance for safe ADLs and gait.          If plan is discharge home, recommend the following: A lot of help with bathing/dressing/bathroom;A lot of help with walking and/or transfers;Help with stairs or ramp for entrance;Assistance with cooking/housework   Can travel by private vehicle   Yes    Equipment Recommendations None recommended by PT  Recommendations for Other Services       Functional Status Assessment Patient has had a recent decline in their functional status and demonstrates the ability to make significant improvements in function in a reasonable and predictable amount of time.     Precautions / Restrictions Precautions Precautions: Fall Recall of Precautions/Restrictions:  Intact Restrictions Weight Bearing Restrictions Per Provider Order: No      Mobility  Bed Mobility Overal bed mobility: Needs Assistance Bed Mobility: Supine to Sit     Supine to sit: Supervision, Contact guard     General bed mobility comments: increased time with labored movement    Transfers Overall transfer level: Needs assistance Equipment used: Straight cane, 1 person hand held assist Transfers: Sit to/from Stand, Bed to chair/wheelchair/BSC Sit to Stand: Min assist   Step pivot transfers: Min assist       General transfer comment: unsteady labored movement using her cane, required hand held assist for safety    Ambulation/Gait Ambulation/Gait assistance: Contact guard assist, Min assist Gait Distance (Feet): 12 Feet Assistive device: Straight cane, 1 person hand held assist Gait Pattern/deviations: Step-to pattern, Decreased step length - right, Decreased step length - left, Decreased stride length Gait velocity: slow     General Gait Details: slow labored unsteady movement requiring hand helds assist while using SPC with right hand  Stairs            Wheelchair Mobility     Tilt Bed    Modified Rankin (Stroke Patients Only)       Balance Overall balance assessment: Needs assistance Sitting-balance support: Feet supported, No upper extremity supported Sitting balance-Leahy Scale: Fair Sitting balance - Comments: fair/good seated at EOB   Standing balance support: During functional activity, Single extremity supported Standing balance-Leahy Scale: Poor Standing balance comment: fair/poor using SPC  Pertinent Vitals/Pain Pain Assessment Pain Assessment: No/denies pain    Home Living Family/patient expects to be discharged to:: Private residence Living Arrangements: Alone Available Help at Discharge: Family;Available PRN/intermittently Type of Home: Mobile home Home Access: Stairs to  enter Entrance Stairs-Rails: Right;Left;Can reach both Entrance Stairs-Number of Steps: 5   Home Layout: One level Home Equipment: Cane - single point;Toilet riser;Grab bars - tub/shower;Rolling Walker (2 wheels);Shower seat      Prior Function Prior Level of Function : Independent/Modified Independent;Driving             Mobility Comments: Retail buyer SPC, drives, shops ADLs Comments: Independent     Extremity/Trunk Assessment   Upper Extremity Assessment Upper Extremity Assessment: Generalized weakness    Lower Extremity Assessment Lower Extremity Assessment: Generalized weakness    Cervical / Trunk Assessment Cervical / Trunk Assessment: Normal  Communication   Communication Communication: No apparent difficulties    Cognition Arousal: Alert Behavior During Therapy: WFL for tasks assessed/performed   PT - Cognitive impairments: No apparent impairments                         Following commands: Intact       Cueing Cueing Techniques: Verbal cues, Tactile cues     General Comments      Exercises     Assessment/Plan    PT Assessment Patient needs continued PT services  PT Problem List Decreased strength;Decreased activity tolerance;Decreased balance;Decreased mobility;Cardiopulmonary status limiting activity       PT Treatment Interventions DME instruction;Gait training;Stair training;Functional mobility training;Therapeutic activities;Therapeutic exercise;Balance training;Patient/family education    PT Goals (Current goals can be found in the Care Plan section)  Acute Rehab PT Goals Patient Stated Goal: return home with family to assist PT Goal Formulation: With patient Time For Goal Achievement: 08/01/23 Potential to Achieve Goals: Good    Frequency Min 3X/week     Co-evaluation               AM-PAC PT "6 Clicks" Mobility  Outcome Measure Help needed turning from your back to your side while in a flat bed  without using bedrails?: A Little Help needed moving from lying on your back to sitting on the side of a flat bed without using bedrails?: A Little Help needed moving to and from a bed to a chair (including a wheelchair)?: A Little Help needed standing up from a chair using your arms (e.g., wheelchair or bedside chair)?: A Lot Help needed to walk in hospital room?: A Lot Help needed climbing 3-5 steps with a railing? : A Lot 6 Click Score: 15    End of Session Equipment Utilized During Treatment: Oxygen Activity Tolerance: Patient tolerated treatment well;Patient limited by fatigue Patient left: in chair;with call bell/phone within reach Nurse Communication: Mobility status PT Visit Diagnosis: Unsteadiness on feet (R26.81);Other abnormalities of gait and mobility (R26.89);Muscle weakness (generalized) (M62.81)    Time: 1610-9604 PT Time Calculation (min) (ACUTE ONLY): 32 min   Charges:   PT Evaluation $PT Eval Moderate Complexity: 1 Mod PT Treatments $Therapeutic Activity: 23-37 mins PT General Charges $$ ACUTE PT VISIT: 1 Visit         3:25 PM, 07/18/23 Walton Guppy, MPT Physical Therapist with [facility] [phone] office [phone] mobile phone

## 2023-07-18 NOTE — Progress Notes (Addendum)
 Progress Note   Patient: Laura Mcgee ZOX:[id number] DOB: 1950/12/31 DOA: 07/16/2023     2 DOS: the patient was seen and examined on 07/18/2023   Brief hospital admission narrative: As per H&P written by Dr. Quintella Buck on 07/16/2023 Laura Mcgee is a 73 y.o. female with medical history significant for atrial fibrillation, alcohol abuse, hypertension, vaginal cancer, COPD, pulmonary embolism. Patient presented to the ED with complaints of left-sided chest and flank pain that started yesterday.  Chest pain was present with deep breathing.  Otherwise she denies difficulty breathing.  She has a chronic unchanged cough.  She reports clammy feeling.  No lower extremity swelling, no vomiting no diarrhea.  No urinary frequency or pain.   ED Course: Temperature 97.7.  Heart rate 85-104.  Respiratory rate 16-20.  Blood pressure systolic 94-128.  O2 sats 91 to 99% on room air. WBC 22.  D-dimer elevated at 2.85.  Troponin 9.  UA clean.  COVID influenza RSV negative. CTA chest-negative for PE, left upper lobe left lower lobe, right lower lobe airspace disease/infiltrate consistent with multifocal pneumonia. CT abdomen and pelvis W contrast-  Bilateral nonspecific moderate to marked severity perinephric inflammatory fat stranding. Correlation with urinalysis is recommended as sequelae associated with acute pyelonephritis cannot be excluded.   IV ceftriaxone and azithromycin started. Norco given.  DuoNebs given.  Assessment and plan Multifocal pneumonia -Multifocal pneumonia with sepsis.   -CTA chest negative for PE, findings consistent with multifocal pneumonia. - Continue bronchodilators, IV antibiotics and the use of flutter valve-maintain adequate hydration will follow clinical response - Currently afebrile and WBCs trending down. - continue to wean off Oxygen as tolerated.  Paroxysmal atrial fibrillation - Not on anticoagulation chronically - Continue adjusted dose of beta-blockers and will use  Cardizem  Norvasc . - Follow 2D echo.  hyponatremia/hypokalemia - Continue to replete electrolytes as tolerated and follow trend -Continue telemetry monitoring.  Essential hypertension -Currently stable - Continue current antihypertensive agent is metoprolol , Cardizem ) - Heart healthy/low-sodium diet discussed with patient  Severe sepsis (HCC) -Severe sepsis due to multifocal pneumonia met at time of admission.   - Continue to follow clinical response - Continue IV antibiotics-maintain adequate hydration.  Lactic acidosis -Within normal limits after fluid resuscitation - Continue treatment for sepsis. - Follow clinical response.   Subjective:  Patient experiencing overall improvement in her breathing and is currently not complaining of palpitations or chest pain.  After recent oncology follow-up.  Patient has sleep to sinus rhythm.  In no acute distress.  Physical Exam: Vitals:   07/18/23 1515 07/18/23 1538 07/18/23 1637 07/18/23 1657  BP:   106/64 113/71  Pulse: 62   (!) 53  Resp: 16     Temp:  98.1 F (36.7 C)    TempSrc:  Oral    SpO2: 90%   94%  Weight:    71.9 kg  Height:       General exam: Alert, awake, oriented x 3; no chest pain, no nausea, no vomiting.  2 L nasal cannula in place. Respiratory system: Fair air movement bilaterally; no wheezing positive rhonchi.  Patient reported feeling short with activity. Cardiovascular system: Rate controlled, no rubs, no gallops, Gastrointestinal system: Abdomen is nondistended, soft and nontender. No organomegaly or masses felt. Normal bowel sounds heard. Central nervous system: No focal neurological deficits. Extremities: No cyanosis or clubbing; no edema. Skin: No petechiae. Psychiatry: Judgement and insight appear normal. Mood & affect appropriate.   Latest data Reviewed: Magnesium: 2.3 CBC: WBC 16.1, hemoglobin 14.6 and platelet  count 179K Basic metabolic panel: Sodium 135, potassium 3.7, chloride, bicarb 22, BUN 15  and Cr 0.63, GFR >60 Lactic acid:1.6  Family Communication: No family at bedside.  Disposition: Status is: Inpatient Remains inpatient appropriate because: Continue IV antibiotics.  Time spent: 50 minutes  Author: Justina Oman, MD 07/18/2023 5:16 PM  For on call review [email or web address].

## 2023-07-18 NOTE — TOC Initial Note (Addendum)
 Transition of Care [facility]) - Initial/Assessment Note    Patient Details  Name: Laura Mcgee MRN: [id number] Date of Birth: 1951/01/07  [facility] CM/SW Contact:    Grandville Lax, LCSWA Phone Number: 07/18/2023, 10:04 AM  Clinical Narrative:                 [facility] consulted for substance use resources. CSW met with pt at bedside to complete assessment. Pt states she is able to complete her ADLs independently. Pt states that she does not wear O2 at baseline. CSW spoke with pt about interest in substance use resources. Pt states that she is not interested in these at this time. TOC to follow.   Addendum 3:30pm: CSW spoke with pt about PT recommendation for SNF placement. Pt states that she is not going to SNF and will return home. CSW inquired if pt felt safe returning home. Pt states that she will agree to [facility] being set up. Pt has a cane and walker and would like a BSC. CSW to arrange [facility] and DME. TOC to follow.   Expected Discharge Plan: Home/Self Care Barriers to Discharge: Continued Medical Work up   Patient Goals and CMS Choice Patient states their goals for this hospitalization and ongoing recovery are:: return home CMS Medicare.gov Compare Post Acute Care list provided to:: Patient Choice offered to / list presented to : Patient      Expected Discharge Plan and Services In-house Referral: Clinical Social Work Discharge Planning Services: CM Consult   Living arrangements for the past 2 months: Single Family Home                                      Prior Living Arrangements/Services Living arrangements for the past 2 months: Single Family Home Lives with:: Self Patient language and need for interpreter reviewed:: Yes Do you feel safe going back to the place where you live?: Yes      Need for Family Participation in Patient Care: Yes (Comment) Care giver support system in place?: Yes (comment)   Criminal Activity/Legal Involvement Pertinent to Current  Situation/Hospitalization: No - Comment as needed  Activities of Daily Living   ADL Screening (condition at time of admission) Independently performs ADLs?: Yes (appropriate for developmental age) Is the patient deaf or have difficulty hearing?: No Does the patient have difficulty seeing, even when wearing glasses/contacts?: No Does the patient have difficulty concentrating, remembering, or making decisions?: No  Permission Sought/Granted                  Emotional Assessment Appearance:: Appears stated age Attitude/Demeanor/Rapport: Engaged Affect (typically observed): Accepting Orientation: : Oriented to Self, Oriented to Place, Oriented to  Time, Oriented to Situation Alcohol / Substance Use: Not Applicable Psych Involvement: No (comment)  Admission diagnosis:  Hyponatremia [E87.1] Left flank pain [R10.9] Pneumonia of left lower lobe due to infectious organism [J18.9] Multifocal pneumonia [J18.9] Patient Active Problem List   Diagnosis Date Noted   Multifocal pneumonia 07/16/2023   Sepsis (HCC) 07/16/2023   Hyponatremia 07/16/2023   Post-menopausal 06/08/2023   Acute recurrent frontal sinusitis 06/08/2023   History of laryngeal spasm 06/08/2023   Primary osteoarthritis of right shoulder 06/08/2023   Chronic pain 03/21/2023   Paroxysmal A-fib (HCC) 06/14/2022   Vulvar cancer (HCC) 05/30/2022   Aortic atherosclerosis (HCC) 06/30/2021   Tobacco abuse 06/30/2021   Alcohol abuse 06/25/2021   Syncope  06/24/2021   Essential hypertension 06/24/2021   Mixed hyperlipidemia 06/24/2021   Peripheral neuropathy 04/03/2019   Lumbar spinal stenosis 04/03/2019   Incontinence of feces 10/04/2018   Chronic cough 04/27/2016   Constipation 04/27/2016   GERD (gastroesophageal reflux disease) 03/30/2015   Dysphagia 03/30/2015   PCP:  Meldon Sport, MD Pharmacy:   [facility] - [place], [place] - [address] [place] [address] Phone:  [phone] Fax: [phone]  [facility] - [place], [place] - [address] [place] [address] Phone: [phone] Fax: [phone]     Social Drivers of Health (SDOH) Social History: SDOH Screenings   Food Insecurity: No Food Insecurity (07/17/2023)  Housing: Low Risk  (07/17/2023)  Transportation Needs: Unmet Transportation Needs (07/17/2023)  Utilities: Not At Risk (07/17/2023)  Alcohol Screen: Low Risk  (05/30/2022)  Depression (PHQ2-9): Low Risk  (06/08/2023)  Financial Resource Strain: Low Risk  (11/30/2022)   [facility]  Physical Activity: Sufficiently Active (11/30/2022)   Received from [facility]  Social Connections: Socially Isolated (07/17/2023)  Stress: No Stress Concern Present (11/30/2022)   Received from [facility]  Tobacco Use: High Risk (07/16/2023)  Health Literacy: Low Risk  (11/30/2022)   Received from [facility]   SDOH Interventions:     Readmission Risk Interventions     No data to display

## 2023-07-19 ENCOUNTER — Encounter: Payer: Self-pay | Admitting: Psychiatry

## 2023-07-19 DIAGNOSIS — J189 Pneumonia, unspecified organism: Secondary | ICD-10-CM | POA: Diagnosis not present

## 2023-07-19 LAB — BASIC METABOLIC PANEL WITH GFR
Anion gap: 8 (ref 5–15)
BUN: 12 mg/dL (ref 8–23)
CO2: 23 mmol/L (ref 22–32)
Calcium: 8.6 mg/dL — ABNORMAL LOW (ref 8.9–10.3)
Chloride: 105 mmol/L (ref 98–111)
Creatinine, Ser: 0.67 mg/dL (ref 0.44–1.00)
GFR, Estimated: >60 mL/min (ref >60)
Glucose, Bld: 87 mg/dL (ref 70–99)
Potassium: 3.2 mmol/L — ABNORMAL LOW (ref 3.5–5.1)
Sodium: 136 mmol/L (ref 135–145)

## 2023-07-19 LAB — CBC
HCT: 37.3 % (ref 36.0–46.0)
Hemoglobin: 12.6 g/dL (ref 12.0–15.0)
MCH: 32.6 pg (ref 26.0–34.0)
MCHC: 33.8 g/dL (ref 30.0–36.0)
MCV: 96.6 fL (ref 80.0–100.0)
Platelets: 176 10*3/uL (ref 150–400)
RBC: 3.86 MIL/uL — ABNORMAL LOW (ref 3.87–5.11)
RDW: 14.5 % (ref 11.5–15.5)
WBC: 9.2 10*3/uL (ref 4.0–10.5)
nRBC: 0 % (ref 0.0–0.2)

## 2023-07-19 MED ORDER — DM-GUAIFENESIN ER 30-600 MG PO TB12
1.0000 | ORAL_TABLET | Freq: Two times a day (BID) | ORAL | 0 refills | Status: DC | PRN
Start: 2023-07-19 — End: 2023-10-23

## 2023-07-19 MED ORDER — FOLIC ACID 1 MG PO TABS: 1.0000 mg | ORAL_TABLET | Freq: Every day | ORAL | 0 refills | Status: AC

## 2023-07-19 MED ORDER — DILTIAZEM HCL ER COATED BEADS 180 MG PO CP24: 180.0000 mg | ORAL_CAPSULE | Freq: Every day | ORAL | 11 refills | Status: DC

## 2023-07-19 MED ORDER — CEFDINIR 300 MG PO CAPS
300.0000 mg | ORAL_CAPSULE | Freq: Two times a day (BID) | ORAL | 0 refills | Status: AC
Start: 2023-07-19 — End: 2023-07-22

## 2023-07-19 MED ORDER — METOPROLOL TARTRATE 50 MG PO TABS: 50.0000 mg | ORAL_TABLET | Freq: Two times a day (BID) | ORAL | 0 refills | Status: DC

## 2023-07-19 MED ORDER — VITAMIN B-1 100 MG PO TABS: 100.0000 mg | ORAL_TABLET | Freq: Every day | ORAL | 0 refills | Status: AC

## 2023-07-19 MED ORDER — MIDODRINE HCL 5 MG PO TABS: 5.0000 mg | ORAL_TABLET | Freq: Three times a day (TID) | ORAL | 1 refills | Status: DC

## 2023-07-19 MED ORDER — AZITHROMYCIN 250 MG PO TABS: 500.0000 mg | ORAL_TABLET | Freq: Every day | ORAL | Status: DC

## 2023-07-19 MED ORDER — AZITHROMYCIN 250 MG PO TABS
250.0000 mg | ORAL_TABLET | Freq: Every day | ORAL | 0 refills | Status: AC
Start: 2023-07-19 — End: 2023-07-21

## 2023-07-19 MED ORDER — COMBIVENT RESPIMAT 20-100 MCG/ACT IN AERS: 1.0000 | INHALATION_SPRAY | Freq: Four times a day (QID) | RESPIRATORY_TRACT | 1 refills | Status: DC | PRN

## 2023-07-19 NOTE — Progress Notes (Signed)
 SATURATION QUALIFICATIONS: (This note is used to comply with regulatory documentation for home oxygen)  Patient Saturations on Room Air at Rest = 87%  Patient Saturations on Room Air while Ambulating = 87%  Patient Saturations on 1 Liters of oxygen while Ambulating = 92%  Please briefly explain why patient needs home oxygen:  To maintain oxygen saturation greater than 90%  Estle Hemp, RN

## 2023-07-19 NOTE — Care Management Important Message (Signed)
 Important Message  Patient Details  Name: Laura Mcgee MRN: [id number] Date of Birth: 09-02-1950   Important Message Given:  N/A - LOS <3 / Initial given by admissions     Laura Mcgee L Sebastiano Luecke 07/19/2023, 12:33 PM

## 2023-07-19 NOTE — TOC Transition Note (Signed)
 Transition of Care [facility]) - Discharge Note   Patient Details  Name: Laura Mcgee MRN: [id number] Date of Birth: 02-08-1951  [facility]) CM/SW Contact:  Grandville Lax, LCSWA Phone Number: 07/19/2023, 11:57 AM  Clinical Narrative:    CSW updated that pt will need O2 at D/C. CSW met with pt at bedside, she states she has no DME agency preference. CSW sent home O2 and BSC referral to Zack with [employer] and he will get DME delivered to room. CSW spoke to Artavia with [facility] who accepts [facility] referral. MD placed all needed DME and HH orders. TOC signing off.   Final next level of care: Home w Home Health Services Barriers to Discharge: Barriers Resolved   Patient Goals and CMS Choice Patient states their goals for this hospitalization and ongoing recovery are:: return home CMS Medicare.gov Compare Post Acute Care list provided to:: Patient Choice offered to / list presented to : Patient      Discharge Placement                       Discharge Plan and Services Additional resources added to the After Visit Summary for   In-house Referral: Clinical Social Work Discharge Planning Services: CM Consult            DME Arranged: Bedside commode, Oxygen DME Agency: [facility] Date DME Agency Contacted: 07/19/23   Representative spoke with at DME Agency: Coral Der HH Arranged: PT HH Agency: [facility] Date HH Agency Contacted: 07/19/23   Representative spoke with at [facility] Agency: Renetta Carter  Social Drivers of Health (SDOH) Interventions SDOH Screenings   Food Insecurity: No Food Insecurity (07/17/2023)  Housing: Low Risk  (07/17/2023)  Transportation Needs: Unmet Transportation Needs (07/17/2023)  Utilities: Not At Risk (07/17/2023)  Alcohol Screen: Low Risk  (05/30/2022)  Depression (PHQ2-9): Low Risk  (06/08/2023)  Financial Resource Strain: Low Risk  (11/30/2022)   Received from [facility]  Physical Activity: Sufficiently Active (11/30/2022)    Received from [facility]  Social Connections: Socially Isolated (07/17/2023)  Stress: No Stress Concern Present (11/30/2022)   [facility] from [facility]  Tobacco Use: High Risk (07/16/2023)  Health Literacy: Low Risk  (11/30/2022)   Received from [facility]     Readmission Risk Interventions     No data to display

## 2023-07-19 NOTE — Discharge Summary (Signed)
 Physician Discharge Summary  Laura Mcgee ZOX:[id number] DOB: November 24, 1950 DOA: 07/16/2023  PCP: Meldon Sport, MD  Admit date: 07/16/2023  Discharge date: 07/19/2023  Admitted From:Home  Disposition:  Home  Recommendations for Outpatient Follow-up:  Follow up with PCP in 1-2 weeks Hold home antihypertensives and follow-up blood pressure with PCP, remain on midodrine for now Consider initiation of anticoagulation outpatient for atrial fibrillation if patient is able to discontinue alcohol use Continue antibiotics to finish course of treatment with azithromycin and cefdinir Combivent as needed for shortness of breath or wheezing and Mucinex prescribed for cough Counseled on alcohol cessation  Home Health: Refused SNF, home health PT/OT set up  Equipment/Devices: Bedside commode, 1 L nasal cannula oxygen  Discharge Condition:Stable  CODE STATUS: Full  Diet recommendation: Heart Healthy  Brief/Interim Summary: Laura Mcgee is a 73 y.o. female with medical history significant for atrial fibrillation, alcohol abuse, hypertension, vaginal cancer, COPD, pulmonary embolism. Patient presented to the ED with complaints of left-sided chest and flank pain that started yesterday.  Chest pain was present with deep breathing.  Patient was admitted with severe sepsis, POA secondary to multifocal pneumonia and was started on IV antibiotic treatment.  She had medication adjustments with home antihypertensives held due to some mild hypotension on admission for which she requires midodrine.  Home antihypertensives held for now until further follow-up with PCP.  Home medications have been adjusted with metoprolol  and Cardizem  and Norvasc  has been discontinued.  She is not on anticoagulation chronically due to alcohol use, but may need to consider this in the near future.  She will require some home oxygen and 2D echocardiogram without any acute findings.  No other acute events or concerns  noted.  Discharge Diagnoses:  Principal Problem:   Multifocal pneumonia Active Problems:   Sepsis (HCC)   Hyponatremia   Essential hypertension   Tobacco abuse   Paroxysmal A-fib (HCC)  Principal discharge diagnosis: Severe sepsis, POA secondary to multifocal pneumonia.  Discharge Instructions  Discharge Instructions     Diet - low sodium heart healthy   Complete by: As directed    Increase activity slowly   Complete by: As directed       Allergies as of 07/19/2023       Reactions   Gabapentin  Other (See Comments)   dizziness   Tetracyclines & Related Other (See Comments)   Makes her sick on her stomach         Medication List     STOP taking these medications    amLODipine  5 MG tablet Commonly known as: NORVASC    olmesartan  20 MG tablet Commonly known as: BENICAR        TAKE these medications    azithromycin 250 MG tablet Commonly known as: ZITHROMAX Take 1 tablet (250 mg total) by mouth daily for 2 days.   baclofen  10 MG tablet Commonly known as: LIORESAL  Take 1 tablet (10 mg total) by mouth daily as needed for muscle spasms (laryngospasm).   cefdinir 300 MG capsule Commonly known as: OMNICEF Take 1 capsule (300 mg total) by mouth 2 (two) times daily for 3 days.   celecoxib  200 MG capsule Commonly known as: CeleBREX  Take 1 capsule (200 mg total) by mouth 2 (two) times daily.   Combivent Respimat 20-100 MCG/ACT Aers respimat Generic drug: Ipratropium-Albuterol  Inhale 1 puff into the lungs every 6 (six) hours as needed.   dextromethorphan-guaiFENesin 30-600 MG 12hr tablet Commonly known as: MUCINEX DM Take 1 tablet by mouth 2 (two) times daily  as needed for cough.   diltiazem  180 MG 24 hr capsule Commonly known as: Cardizem  CD Take 1 capsule (180 mg total) by mouth daily.   folic acid  1 MG tablet Commonly known as: FOLVITE  Take 1 tablet (1 mg total) by mouth daily. Start taking on: Jul 20, 2023   HYDROcodone -acetaminophen  5-325 MG  tablet Commonly known as: NORCO/VICODIN Take 1 tablet by mouth every 12 (twelve) hours as needed for moderate pain (pain score 4-6) or severe pain (pain score 7-10).   lidocaine  2 % jelly Commonly known as: XYLOCAINE  Apply 1 Application topically as needed.   lovastatin  20 MG tablet Commonly known as: MEVACOR  Take 1 tablet (20 mg total) by mouth in the morning.   metoprolol  tartrate 50 MG tablet Commonly known as: LOPRESSOR  Take 1 tablet (50 mg total) by mouth 2 (two) times daily.   midodrine 5 MG tablet Commonly known as: PROAMATINE Take 1 tablet (5 mg total) by mouth 3 (three) times daily with meals.   PREVAGEN PO Take 1 tablet by mouth daily.   SYSTANE COMPLETE OP Place 1 drop into both eyes in the morning, at noon, and at bedtime.   thiamine  100 MG tablet Commonly known as: Vitamin B-1 Take 1 tablet (100 mg total) by mouth daily. Start taking on: Jul 20, 2023   venlafaxine  XR 150 MG 24 hr capsule Commonly known as: EFFEXOR -XR Take 2 capsules (300 mg total) by mouth every evening. What changed:  how much to take when to take this               Durable Medical Equipment  (From admission, onward)           Start     Ordered   07/19/23 1109  For home use only DME Bedside commode  Once       Question:  Patient needs a bedside commode to treat with the following condition  Answer:  Weakness   07/19/23 1108   07/19/23 1103  For home use only DME Bedside commode  Once       Comments: Patient has difficulty walking, will benefit from use of [facility]  Question:  Patient needs a bedside commode to treat with the following condition  Answer:  Gait difficulty   07/19/23 1103   07/19/23 1044  For home use only DME oxygen  Once       Question Answer Comment  Length of Need 6 Months   Mode or (Route) Nasal cannula   Liters per Minute 1   Frequency Continuous (stationary and portable oxygen unit needed)   Oxygen conserving device Yes   Oxygen delivery system Gas       07/19/23 1043            Follow-up Information     Meldon Sport, MD. Schedule an appointment as soon as possible for a visit in 1 week(s).   Specialty: Internal Medicine Contact information: [address] [place] [address] [phone]                Allergies  Allergen Reactions   Gabapentin  Other (See Comments)    dizziness   Tetracyclines & Related Other (See Comments)    Makes her sick on her stomach     Consultations: None   Procedures/Studies: ECHOCARDIOGRAM COMPLETE Result Date: 07/18/2023    ECHOCARDIOGRAM REPORT   Patient Name:   Laura Mcgee Date of Exam: 07/18/2023 Medical Rec #:  [id number]      Height:  66.0 in Accession #:    [id number]     Weight:       161.2 lb Date of Birth:  1950-04-19      BSA:          1.824 m Patient Age:    72 years       BP:           122/72 mmHg Patient Gender: F              HR:           64 bpm. Exam Location:  [facility] Procedure: 2D Echo, Cardiac Doppler, Color Doppler and Intracardiac            Opacification Agent (Both Spectral and Color Flow Doppler were            utilized during procedure). Indications:    Atrial Fibrillation  History:        Patient has prior history of Echocardiogram examinations.                 Arrythmias:Atrial Fibrillation, Signs/Symptoms:Syncope; Risk                 Factors:Hypertension and Current Smoker.  Sonographer:    Willey Harrier Referring Phys: [phone] CARLOS MADERA  Sonographer Comments: Technically difficult study due to poor echo windows. IMPRESSIONS  1. Left ventricular ejection fraction, by estimation, is 50%. The left ventricle has low normal function. The left ventricle has no regional wall motion abnormalities. Left ventricular diastolic parameters are consistent with Grade I diastolic dysfunction (impaired relaxation).  2. Right ventricular systolic function is low normal. The right ventricular size is mildly enlarged. Tricuspid regurgitation signal is inadequate for  assessing PA pressure.  3. The mitral valve is abnormal. Mild mitral valve regurgitation. No evidence of mitral stenosis.  4. The aortic valve is tricuspid. Aortic valve regurgitation is not visualized. No aortic stenosis is present.  5. The inferior vena cava is dilated in size with <50% respiratory variability, suggesting right atrial pressure of 15 mmHg. FINDINGS  Left Ventricle: Left ventricular ejection fraction, by estimation, is 50%. The left ventricle has low normal function. The left ventricle has no regional wall motion abnormalities. The left ventricular internal cavity size was normal in size. There is no left ventricular hypertrophy. Left ventricular diastolic parameters are consistent with Grade I diastolic dysfunction (impaired relaxation). Normal left ventricular filling pressure. Right Ventricle: The right ventricular size is mildly enlarged. Right vetricular wall thickness was not well visualized. Right ventricular systolic function is low normal. Tricuspid regurgitation signal is inadequate for assessing PA pressure. The tricuspid regurgitant velocity is 1.72 m/s, and with an assumed right atrial pressure of 15 mmHg, the estimated right ventricular systolic pressure is 26.8 mmHg. Left Atrium: Left atrial size was normal in size. Right Atrium: Right atrial size was normal in size. Pericardium: There is no evidence of pericardial effusion. Mitral Valve: The mitral valve is abnormal. Mild mitral valve regurgitation. No evidence of mitral valve stenosis. MV peak gradient, 3.6 mmHg. The mean mitral valve gradient is 1.0 mmHg. Tricuspid Valve: The tricuspid valve is normal in structure. Tricuspid valve regurgitation is trivial. No evidence of tricuspid stenosis. Aortic Valve: The aortic valve is tricuspid. Aortic valve regurgitation is not visualized. No aortic stenosis is present. Aortic valve mean gradient measures 1.5 mmHg. Aortic valve peak gradient measures 3.2 mmHg. Aortic valve area, by VTI  measures 3.50 cm. Pulmonic Valve: The pulmonic valve was not well visualized. Pulmonic valve  regurgitation is not visualized. No evidence of pulmonic stenosis. Aorta: The aortic root and ascending aorta are structurally normal, with no evidence of dilitation. Venous: The inferior vena cava is dilated in size with less than 50% respiratory variability, suggesting right atrial pressure of 15 mmHg. IAS/Shunts: No atrial level shunt detected by color flow Doppler.  LEFT VENTRICLE PLAX 2D LVIDd:         4.90 cm   Diastology LVIDs:         3.70 cm   LV e' medial:    6.31 cm/s LV PW:         0.70 cm   LV E/e' medial:  10.7 LV IVS:        0.70 cm   LV e' lateral:   7.29 cm/s LVOT diam:     2.10 cm   LV E/e' lateral: 9.3 LV SV:         54 LV SV Index:   30 LVOT Area:     3.46 cm  RIGHT VENTRICLE            IVC RV Basal diam:  1.90 cm    IVC diam: 2.10 cm RV S prime:     8.70 cm/s TAPSE (M-mode): 1.8 cm LEFT ATRIUM             Index        RIGHT ATRIUM          Index LA Vol (A2C):   44.4 ml 24.34 ml/m  RA Area:     7.15 cm LA Vol (A4C):   22.6 ml 12.39 ml/m  RA Volume:   12.20 ml 6.69 ml/m LA Biplane Vol: 32.7 ml 17.92 ml/m  AORTIC VALVE AV Area (Vmax):    2.87 cm AV Area (Vmean):   3.11 cm AV Area (VTI):     3.50 cm AV Vmax:           89.18 cm/s AV Vmean:          55.740 cm/s AV VTI:            0.155 m AV Peak Grad:      3.2 mmHg AV Mean Grad:      1.5 mmHg LVOT Vmax:         73.80 cm/s LVOT Vmean:        50.000 cm/s LVOT VTI:          0.156 m LVOT/AV VTI ratio: 1.01  AORTA Ao Root diam: 2.80 cm Ao Asc diam:  3.00 cm MITRAL VALVE                TRICUSPID VALVE MV Area (PHT): 4.96 cm     TR Peak grad:   11.8 mmHg MV Area VTI:   2.16 cm     TR Vmax:        172.00 cm/s MV Peak grad:  3.6 mmHg MV Mean grad:  1.0 mmHg     SHUNTS MV Vmax:       0.95 m/s     Systemic VTI:  0.16 m MV Vmean:      49.1 cm/s    Systemic Diam: 2.10 cm MV Decel Time: 153 msec MV E velocity: 67.70 cm/s MV A velocity: 105.00 cm/s MV E/A ratio:   0.64 Armida Lander MD Electronically signed by Armida Lander MD Signature Date/Time: 07/18/2023/10:32:38 AM    Final    CT Angio Chest PE W and/or Wo Contrast Result Date: 07/16/2023 CLINICAL DATA:  Left-sided chest  pain. EXAM: CT ANGIOGRAPHY CHEST WITH CONTRAST TECHNIQUE: Multidetector CT imaging of the chest was performed using the standard protocol during bolus administration of intravenous contrast. Multiplanar CT image reconstructions and MIPs were obtained to evaluate the vascular anatomy. RADIATION DOSE REDUCTION: This exam was performed according to the departmental dose-optimization program which includes automated exposure control, adjustment of the mA and/or kV according to patient size and/or use of iterative reconstruction technique. CONTRAST:  OMNIPAQUE  IOHEXOL  350 MG/ML SOLN COMPARISON:  April 28, 2016 FINDINGS: Cardiovascular: There is marked severity calcification of the thoracic aorta, without evidence of aortic aneurysm satisfactory opacification of the pulmonary arteries to the segmental level. No evidence of pulmonary embolism. Normal heart size with marked severity coronary artery calcification. No pericardial effusion. Mediastinum/Nodes: No enlarged mediastinal, hilar, or axillary lymph nodes. Thyroid gland, trachea, and esophagus demonstrate no significant findings. Lungs/Pleura: Mild multifocal left upper lobe and moderate to marked severity posteromedial left lower lobe airspace disease is seen. Mild posterior right lower lobe atelectasis and/or infiltrate is also noted. There is a stable, likely benign 8 mm noncalcified pulmonary nodule seen within the posterior aspect of the right lower lobe (axial CT image 70, CT series 4). No pleural effusion or pneumothorax is identified. Upper Abdomen: A 4.4 cm low-attenuation right adrenal mass is seen (approximately 1.27 Hounsfield units). An additional 4.7 cm low-attenuation left adrenal mass is noted (approximately -2.49 Hounsfield  units). There is bilateral nonspecific moderate to marked severity perinephric inflammatory fat stranding. Musculoskeletal: No chest wall abnormality. No acute or significant osseous findings. Review of the MIP images confirms the above findings. IMPRESSION: 1. No evidence of pulmonary embolism. 2. Mild multifocal left upper lobe and moderate to marked severity posteromedial left lower lobe airspace disease, consistent with multifocal pneumonia. 3. Mild posterior right lower lobe atelectasis and/or infiltrate. 4. Bilateral low-attenuation adrenal masses, consistent with adrenal adenomas. 5. Bilateral nonspecific moderate to marked severity perinephric inflammatory fat stranding. Correlation with urinalysis is recommended as sequelae associated with acute pyelonephritis cannot be excluded. 6. Aortic atherosclerosis. Electronically Signed   By: Virgle Grime M.D.   On: 07/16/2023 19:58   CT ABDOMEN PELVIS W CONTRAST Result Date: 07/16/2023 CLINICAL DATA:  Abdominal/flank pain, stone suspected LLQ abdominal pain EXAM: CT ABDOMEN AND PELVIS WITH CONTRAST TECHNIQUE: Multidetector CT imaging of the abdomen and pelvis was performed using the standard protocol following bolus administration of intravenous contrast. RADIATION DOSE REDUCTION: This exam was performed according to the departmental dose-optimization program which includes automated exposure control, adjustment of the mA and/or kV according to patient size and/or use of iterative reconstruction technique. CONTRAST:  OMNIPAQUE  IOHEXOL  350 MG/ML SOLN COMPARISON:  Concurrent chest CTA, reported separately. Abdominopelvic CT 02/08/2023 FINDINGS: Lower chest: Assessed fully on concurrent chest CT, reported separately. Hepatobiliary: Tiny cyst in the left lobe of the liver needs no further imaging follow-up. Focal fatty infiltration adjacent to the falciform ligament. No suspicious liver lesion. Gallbladder physiologically distended, no calcified stone. No  biliary dilatation. Pancreas: Mild parenchymal atrophy. No ductal dilatation or inflammation. Spleen: Normal in size without focal abnormality. Adrenals/Urinary Tract: Bilateral adrenal nodules, 4.1 x 2.6 cm on the right and 4.5 x 4.4 cm on the left. These were low-density on prior exam, typical of head no mass. No change from 2020 exam consistent with benign adenomas. No hydronephrosis or renal calculi. Symmetric bilateral perinephric stranding, chronic. Bilateral renal cysts. No further follow-up imaging is recommended. Partially distended urinary bladder, no bladder wall thickening. Stomach/Bowel: The stomach is nondistended. There is  no bowel obstruction or inflammatory change. Normal appendix. Small to moderate colonic stool burden. Left colonic diverticulosis without diverticulitis. Vascular/Lymphatic: Advanced aortic and branch atherosclerosis. No aortic aneurysm. Patent portal vein. No abdominopelvic adenopathy. Reproductive: Mild endometrial thickening, recently assessed with pelvic ultrasound. No adnexal mass. Other: No free air, free fluid, or intra-abdominal fluid collection. Minimal fat containing umbilical hernia. Musculoskeletal: Bilateral hip degenerative change. Scoliosis and degenerative change throughout the spine. Stable benign-appearing lucencies in the spine. IMPRESSION: 1. No acute abnormality in the abdomen/pelvis. 2. Left colonic diverticulosis without diverticulitis. 3. Stable bilateral adrenal adenomas. 4. Please reference concurrent chest CT for lung base findings. Aortic Atherosclerosis (ICD10-I70.0). Electronically Signed   By: Chadwick Colonel M.D.   On: 07/16/2023 19:57   DG Chest Portable 1 View Result Date: 07/16/2023 CLINICAL DATA:  Left-sided rib/chest pain. EXAM: PORTABLE CHEST 1 VIEW COMPARISON:  Radiograph 08/17/2022. FINDINGS: Normal heart size for technique. Stable mediastinal contours. Aortic atherosclerosis. Patchy airspace disease in the left infrahilar lung. Chronic  blunting of the left costophrenic angle typical of scarring. No significant pleural effusion. No pneumothorax. Chronic right shoulder arthropathy. IMPRESSION: Patchy airspace disease in the left infrahilar lung, suspicious for pneumonia. Electronically Signed   By: Chadwick Colonel M.D.   On: 07/16/2023 18:33     Discharge Exam: Vitals:   07/19/23 0516 07/19/23 0940  BP: 121/77   Pulse: (!) 59   Resp: 18   Temp: 98.1 F (36.7 C)   SpO2: 97% 91%   Vitals:   07/18/23 2103 07/19/23 0117 07/19/23 0516 07/19/23 0940  BP: 113/73 112/68 121/77   Pulse: 62 (!) 55 (!) 59   Resp: 16 16 18    Temp: 98.1 F (36.7 C) 97.7 F (36.5 C) 98.1 F (36.7 C)   TempSrc: Oral Oral Oral   SpO2: 97% 96% 97% 91%  Weight:      Height:        General: Pt is alert, awake, not in acute distress Cardiovascular: RRR, S1/S2 +, no rubs, no gallops Respiratory: CTA bilaterally, no wheezing, no rhonchi Abdominal: Soft, NT, ND, bowel sounds + Extremities: no edema, no cyanosis    The results of significant diagnostics from this hospitalization (including imaging, microbiology, ancillary and laboratory) are listed below for reference.     Microbiology: Recent Results (from the past 240 hours)  Resp panel by RT-PCR (RSV, Flu A&B, Covid) Anterior Nasal Swab     Status: None   Collection Time: 07/16/23  6:51 PM   Specimen: Anterior Nasal Swab  Result Value Ref Range Status   SARS Coronavirus 2 by RT PCR NEGATIVE NEGATIVE Final    Comment: (NOTE) SARS-CoV-2 target nucleic acids are NOT DETECTED.  The SARS-CoV-2 RNA is generally detectable in upper respiratory specimens during the acute phase of infection. The lowest concentration of SARS-CoV-2 viral copies this assay can detect is 138 copies/mL. A negative result does not preclude SARS-Cov-2 infection and should not be used as the sole basis for treatment or other patient management decisions. A negative result may occur with  improper specimen  collection/handling, submission of specimen other than nasopharyngeal swab, presence of viral mutation(s) within the areas targeted by this assay, and inadequate number of viral copies(<138 copies/mL). A negative result must be combined with clinical observations, patient history, and epidemiological information. The expected result is Negative.  Fact Sheet for Patients:  BloggerCourse.com  Fact Sheet for Healthcare Providers:  SeriousBroker.it  This test is no t yet approved or cleared by the [place]  FDA and  has been authorized for detection and/or diagnosis of SARS-CoV-2 by FDA under an Emergency Use Authorization (EUA). This EUA will remain  in effect (meaning this test can be used) for the duration of the COVID-19 declaration under Section 564(b)(1) of the Act, 21 U.S.C.section 360bbb-3(b)(1), unless the authorization is terminated  or revoked sooner.       Influenza A by PCR NEGATIVE NEGATIVE Final   Influenza B by PCR NEGATIVE NEGATIVE Final    Comment: (NOTE) The Xpert Xpress SARS-CoV-2/FLU/RSV plus assay is intended as an aid in the diagnosis of influenza from Nasopharyngeal swab specimens and should not be used as a sole basis for treatment. Nasal washings and aspirates are unacceptable for Xpert Xpress SARS-CoV-2/FLU/RSV testing.  Fact Sheet for Patients: BloggerCourse.com  Fact Sheet for Healthcare Providers: SeriousBroker.it  This test is not yet approved or cleared by the [place]  FDA and has been authorized for detection and/or diagnosis of SARS-CoV-2 by FDA under an Emergency Use Authorization (EUA). This EUA will remain in effect (meaning this test can be used) for the duration of the COVID-19 declaration under Section 564(b)(1) of the Act, 21 U.S.C. section 360bbb-3(b)(1), unless the authorization is terminated or revoked.     Resp Syncytial  Virus by PCR NEGATIVE NEGATIVE Final    Comment: (NOTE) Fact Sheet for Patients: BloggerCourse.com  Fact Sheet for Healthcare Providers: SeriousBroker.it  This test is not yet approved or cleared by the [place]  FDA and has been authorized for detection and/or diagnosis of SARS-CoV-2 by FDA under an Emergency Use Authorization (EUA). This EUA will remain in effect (meaning this test can be used) for the duration of the COVID-19 declaration under Section 564(b)(1) of the Act, 21 U.S.C. section 360bbb-3(b)(1), unless the authorization is terminated or revoked.  Performed at [facility], [address], [place], [place] [address]   Culture, blood (Routine X 2) w Reflex to ID Panel     Status: None (Preliminary result)   Collection Time: 07/16/23  9:48 PM   Specimen: Right Antecubital; Blood  Result Value Ref Range Status   Specimen Description RIGHT ANTECUBITAL BLOOD  Final   Special Requests AEROBIC BOTTLE ONLY RIGHT ANTECUBITAL  Final   Culture   Final    NO GROWTH 3 DAYS Performed at [facility], [address], [place], [place] [address]    Report Status PENDING  Incomplete  Culture, blood (Routine X 2) w Reflex to ID Panel     Status: None (Preliminary result)   Collection Time: 07/16/23 10:00 PM   Specimen: BLOOD RIGHT HAND  Result Value Ref Range Status   Specimen Description BLOOD RIGHT HAND BLOOD  Final   Special Requests AEROBIC BOTTLE ONLY Blood Culture adequate volume  Final   Culture   Final    NO GROWTH 3 DAYS Performed at [facility], [address], [place], [place] [address]    Report Status PENDING  Incomplete  MRSA Next Gen by PCR, Nasal     Status: None   Collection Time: 07/17/23  6:22 PM   Specimen: Nasal Mucosa; Nasal Swab  Result Value Ref Range Status   MRSA by PCR Next Gen NOT DETECTED NOT DETECTED Final    Comment: (NOTE) The GeneXpert MRSA Assay (FDA approved for NASAL specimens only), is one  component of a comprehensive MRSA colonization surveillance program. It is not intended to diagnose MRSA infection nor to guide or monitor treatment for MRSA infections. Test performance is not FDA approved in patients less than 2  years old. Performed at [facility], [address], [place], [place] [address]      Labs: BNP (last 3 results) Recent Labs    07/16/23 1716  BNP 261.0*   Basic Metabolic Panel: Recent Labs  Lab 07/16/23 1716 07/17/23 0201 07/18/23 0521 07/19/23 0422  NA 128* 132* 135 136  K 3.6 3.3* 3.7 3.2*  CL 97* 103 105 105  CO2 21* 20* 22 23  GLUCOSE 104* 107* 104* 87  BUN 13 12 15 12   CREATININE 0.84 0.65 0.63 0.67  CALCIUM 8.9 8.5* 8.5* 8.6*  MG  --   --  2.3  --    Liver Function Tests: Recent Labs  Lab 07/16/23 1716  AST 16  ALT 14  ALKPHOS 88  BILITOT 0.9  PROT 6.4*  ALBUMIN 2.9*   No results for input(s): "LIPASE", "AMYLASE" in the last 168 hours. No results for input(s): "AMMONIA" in the last 168 hours. CBC: Recent Labs  Lab 07/16/23 1716 07/17/23 0201 07/19/23 0422  WBC 22.0* 16.1* 9.2  NEUTROABS 17.4*  --   --   HGB 15.7* 14.6 12.6  HCT 44.4 42.7 37.3  MCV 95.3 94.7 96.6  PLT 217 179 176   Cardiac Enzymes: No results for input(s): "CKTOTAL", "CKMB", "CKMBINDEX", "TROPONINI" in the last 168 hours. BNP: Invalid input(s): "POCBNP" CBG: No results for input(s): "GLUCAP" in the last 168 hours. D-Dimer Recent Labs    07/16/23 1716  DDIMER 2.85*   Hgb A1c No results for input(s): "HGBA1C" in the last 72 hours. Lipid Profile No results for input(s): "CHOL", "HDL", "LDLCALC", "TRIG", "CHOLHDL", "LDLDIRECT" in the last 72 hours. Thyroid function studies No results for input(s): "TSH", "T4TOTAL", "T3FREE", "THYROIDAB" in the last 72 hours.  Invalid input(s): "FREET3" Anemia work up No results for input(s): "VITAMINB12", "FOLATE", "FERRITIN", "TIBC", "IRON", "RETICCTPCT" in the last 72 hours. Urinalysis    Component Value  Date/Time   COLORURINE YELLOW 07/16/2023 1823   APPEARANCEUR HAZY (A) 07/16/2023 1823   APPEARANCEUR Cloudy (A) 06/08/2023 1016   LABSPEC 1.018 07/16/2023 1823   PHURINE 6.0 07/16/2023 1823   GLUCOSEU NEGATIVE 07/16/2023 1823   HGBUR SMALL (A) 07/16/2023 1823   BILIRUBINUR NEGATIVE 07/16/2023 1823   BILIRUBINUR Negative 06/08/2023 1016   KETONESUR NEGATIVE 07/16/2023 1823   PROTEINUR NEGATIVE 07/16/2023 1823   NITRITE NEGATIVE 07/16/2023 1823   LEUKOCYTESUR NEGATIVE 07/16/2023 1823   Sepsis Labs Recent Labs  Lab 07/16/23 1716 07/17/23 0201 07/19/23 0422  WBC 22.0* 16.1* 9.2   Microbiology Recent Results (from the past 240 hours)  Resp panel by RT-PCR (RSV, Flu A&B, Covid) Anterior Nasal Swab     Status: None   Collection Time: 07/16/23  6:51 PM   Specimen: Anterior Nasal Swab  Result Value Ref Range Status   SARS Coronavirus 2 by RT PCR NEGATIVE NEGATIVE Final    Comment: (NOTE) SARS-CoV-2 target nucleic acids are NOT DETECTED.  The SARS-CoV-2 RNA is generally detectable in upper respiratory specimens during the acute phase of infection. The lowest concentration of SARS-CoV-2 viral copies this assay can detect is 138 copies/mL. A negative result does not preclude SARS-Cov-2 infection and should not be used as the sole basis for treatment or other patient management decisions. A negative result may occur with  improper specimen collection/handling, submission of specimen other than nasopharyngeal swab, presence of viral mutation(s) within the areas targeted by this assay, and inadequate number of viral copies(<138 copies/mL). A negative result must be combined with clinical observations, patient history, and  epidemiological information. The expected result is Negative.  Fact Sheet for Patients:  BloggerCourse.com  Fact Sheet for Healthcare Providers:  SeriousBroker.it  This test is no t yet approved or cleared by the  [place]  FDA and  has been authorized for detection and/or diagnosis of SARS-CoV-2 by FDA under an Emergency Use Authorization (EUA). This EUA will remain  in effect (meaning this test can be used) for the duration of the COVID-19 declaration under Section 564(b)(1) of the Act, 21 U.S.C.section 360bbb-3(b)(1), unless the authorization is terminated  or revoked sooner.       Influenza A by PCR NEGATIVE NEGATIVE Final   Influenza B by PCR NEGATIVE NEGATIVE Final    Comment: (NOTE) The Xpert Xpress SARS-CoV-2/FLU/RSV plus assay is intended as an aid in the diagnosis of influenza from Nasopharyngeal swab specimens and should not be used as a sole basis for treatment. Nasal washings and aspirates are unacceptable for Xpert Xpress SARS-CoV-2/FLU/RSV testing.  Fact Sheet for Patients: BloggerCourse.com  Fact Sheet for Healthcare Providers: SeriousBroker.it  This test is not yet approved or cleared by the [place]  FDA and has been authorized for detection and/or diagnosis of SARS-CoV-2 by FDA under an Emergency Use Authorization (EUA). This EUA will remain in effect (meaning this test can be used) for the duration of the COVID-19 declaration under Section 564(b)(1) of the Act, 21 U.S.C. section 360bbb-3(b)(1), unless the authorization is terminated or revoked.     Resp Syncytial Virus by PCR NEGATIVE NEGATIVE Final    Comment: (NOTE) Fact Sheet for Patients: BloggerCourse.com  Fact Sheet for Healthcare Providers: SeriousBroker.it  This test is not yet approved or cleared by the [place]  FDA and has been authorized for detection and/or diagnosis of SARS-CoV-2 by FDA under an Emergency Use Authorization (EUA). This EUA will remain in effect (meaning this test can be used) for the duration of the COVID-19 declaration under Section 564(b)(1) of the Act, 21 U.S.C. section  360bbb-3(b)(1), unless the authorization is terminated or revoked.  Performed at [facility], [address], [place], [place] [address]   Culture, blood (Routine X 2) w Reflex to ID Panel     Status: None (Preliminary result)   Collection Time: 07/16/23  9:48 PM   Specimen: Right Antecubital; Blood  Result Value Ref Range Status   Specimen Description RIGHT ANTECUBITAL BLOOD  Final   Special Requests AEROBIC BOTTLE ONLY RIGHT ANTECUBITAL  Final   Culture   Final    NO GROWTH 3 DAYS Performed at [facility], [address] [place], [place] [address]    Report Status PENDING  Incomplete  Culture, blood (Routine X 2) w Reflex to ID Panel     Status: None (Preliminary result)   Collection Time: 07/16/23 10:00 PM   Specimen: BLOOD RIGHT HAND  Result Value Ref Range Status   Specimen Description BLOOD RIGHT HAND BLOOD  Final   Special Requests AEROBIC BOTTLE ONLY Blood Culture adequate volume  Final   Culture   Final    NO GROWTH 3 DAYS Performed at [facility], [address] [place], [place] [address]    Report Status PENDING  Incomplete  MRSA Next Gen by PCR, Nasal     Status: None   Collection Time: 07/17/23  6:22 PM   Specimen: Nasal Mucosa; Nasal Swab  Result Value Ref Range Status   MRSA by PCR Next Gen NOT DETECTED NOT DETECTED Final    Comment: (NOTE) The GeneXpert MRSA Assay (FDA approved for NASAL specimens  only), is one component of a comprehensive MRSA colonization surveillance program. It is not intended to diagnose MRSA infection nor to guide or monitor treatment for MRSA infections. Test performance is not FDA approved in patients less than 55 years old. Performed at [facility], [address] [place], [place] [address]      Time coordinating discharge: 35 minutes  SIGNED:   Cornelius Dill, DO [facility] 07/19/2023, 11:19 AM  If 7PM-7AM, please contact night-coverage [email or web address]

## 2023-07-19 NOTE — Progress Notes (Signed)
 The beneficiary is confined to a single room and will need a bedside commode in order for pt to safely discharge home.

## 2023-07-19 NOTE — Plan of Care (Addendum)
 Pt is alert and oriented x 4. Up with assist. Pt denies anxiety n/v. CIWA 0. No prns given.  Problem: Education: Goal: Knowledge of General Education information will improve Description: Including pain rating scale, medication(s)/side effects and non-pharmacologic comfort measures Outcome: Progressing   Problem: Health Behavior/Discharge Planning: Goal: Ability to manage health-related needs will improve Outcome: Progressing   Problem: Clinical Measurements: Goal: Ability to maintain clinical measurements within normal limits will improve Outcome: Progressing Goal: Will remain free from infection Outcome: Progressing Goal: Diagnostic test results will improve Outcome: Progressing Goal: Respiratory complications will improve Outcome: Progressing Goal: Cardiovascular complication will be avoided Outcome: Progressing   Problem: Activity: Goal: Risk for activity intolerance will decrease Outcome: Progressing   Problem: Nutrition: Goal: Adequate nutrition will be maintained Outcome: Progressing   Problem: Coping: Goal: Level of anxiety will decrease Outcome: Progressing   Problem: Elimination: Goal: Will not experience complications related to bowel motility Outcome: Progressing Goal: Will not experience complications related to urinary retention Outcome: Progressing   Problem: Pain Managment: Goal: General experience of comfort will improve and/or be controlled Outcome: Progressing   Problem: Safety: Goal: Ability to remain free from injury will improve Outcome: Progressing   Problem: Skin Integrity: Goal: Risk for impaired skin integrity will decrease Outcome: Progressing   Problem: Activity: Goal: Ability to tolerate increased activity will improve Outcome: Progressing   Problem: Clinical Measurements: Goal: Ability to maintain a body temperature in the normal range will improve Outcome: Progressing   Problem: Respiratory: Goal: Ability to maintain adequate  ventilation will improve Outcome: Progressing Goal: Ability to maintain a clear airway will improve Outcome: Progressing

## 2023-07-19 NOTE — Progress Notes (Signed)
 Patient walked on room air . Patient saturation between 87-89% with exertional sob noted. Patient placed back on 1 liter oxygen via [place] and saturation improved to 91%. Will continue to monitor and treat as directed.

## 2023-07-19 NOTE — Progress Notes (Signed)
 PHARMACIST - PHYSICIAN COMMUNICATION DR:   TRH CONCERNING: Antibiotic IV to Oral Route Change Policy  RECOMMENDATION: This patient is receiving azithromycin by the intravenous route.  Based on criteria approved by the Pharmacy and Therapeutics Committee, the antibiotic(s) is/are being converted to the equivalent oral dose form(s).   DESCRIPTION: These criteria include: Patient being treated for a respiratory tract infection, urinary tract infection, cellulitis or clostridium difficile associated diarrhea if on metronidazole The patient is not neutropenic and does not exhibit a GI malabsorption state The patient is eating (either orally or via tube) and/or has been taking other orally administered medications for a least 24 hours The patient is improving clinically and has a Tmax < 100.5  If you have questions about this conversion, please contact the Pharmacy Department  [x]   [phone] )  Cristine Done []   [phone] )  [facility] []   [phone] )  Arlin Benes []   [phone] )  [facility] []   [phone] )  [facility]    Valma Gazella, PharmD, [place], [place] Work Cell: [phone] 07/19/2023 9:04 AM

## 2023-07-20 ENCOUNTER — Telehealth: Payer: Self-pay | Admitting: *Deleted

## 2023-07-20 ENCOUNTER — Telehealth: Payer: Self-pay

## 2023-07-20 DIAGNOSIS — R2689 Other abnormalities of gait and mobility: Secondary | ICD-10-CM | POA: Diagnosis not present

## 2023-07-20 NOTE — Transitions of Care (Post Inpatient/ED Visit) (Signed)
   07/20/2023  Name: Laura Mcgee MRN: [id number] DOB: 04/17/1950  Today's TOC FU Call Status: Today's TOC FU Call Status:: Unsuccessful Call (1st Attempt) Unsuccessful Call (1st Attempt) Date: 07/20/23  Attempted to reach the patient regarding the most recent Inpatient/ED visit.  Follow Up Plan: Additional outreach attempts will be made to reach the patient to complete the Transitions of Care (Post Inpatient/ED visit) call.   Signature Darrall Ellison, LPN [facility] Nurse Health Advisor Direct Dial [phone]

## 2023-07-20 NOTE — Transitions of Care (Post Inpatient/ED Visit) (Signed)
 07/20/2023  Name: Laura Mcgee MRN: [id number] DOB: 08/28/1950  Today's TOC FU Call Status: Today's TOC FU Call Status:: Successful TOC FU Call Completed Unsuccessful Call (1st Attempt) Date: 07/20/23 [facility] FU Call Complete Date: 07/20/23 Patient's Name and Date of Birth confirmed.  Transition Care Management Follow-up Telephone Call Date of Discharge: 07/19/23 [facility] Type of Discharge: Inpatient Admission Primary Inpatient Discharge Diagnosis:: abd pain How have you been since you were released from the hospital?: Better Any questions or concerns?: No  Items Reviewed: Did you receive and understand the discharge instructions provided?: Yes Medications obtained,verified, and reconciled?: Yes (Medications Reviewed) Any new allergies since your discharge?: No Dietary orders reviewed?: Yes  Medications Reviewed Today: Medications Reviewed Today     Reviewed by Darrall Ellison, LPN (Licensed Practical Nurse) on 07/20/23 at 1215  Med List Status: <None>   Medication Order Taking? Sig Documenting Provider Last Dose Status Informant  Apoaequorin (PREVAGEN PO) [id number] No Take 1 tablet by mouth daily. [provider] 07/15/2023 Active Self, Pharmacy Records  azithromycin (ZITHROMAX) 250 MG tablet [id number]  Take 1 tablet (250 mg total) by mouth daily for 2 days. Mason Sole, Pratik D, DO  Active   baclofen  (LIORESAL ) 10 MG tablet [id number] No Take 1 tablet (10 mg total) by mouth daily as needed for muscle spasms (laryngospasm). Meldon Sport, MD Unknown Active Self, Pharmacy Records  cefdinir (OMNICEF) 300 MG capsule [id number]  Take 1 capsule (300 mg total) by mouth 2 (two) times daily for 3 days. Mason Sole, Pratik D, DO  Active   celecoxib  (CELEBREX ) 200 MG capsule [id number] No Take 1 capsule (200 mg total) by mouth 2 (two) times daily.  Patient not taking: Reported on 07/16/2023   [place], [place], [place] Not Taking Active Self, Pharmacy Records   dextromethorphan-guaiFENesin [facility] DM) 30-600 MG 12hr tablet [id number]  Take 1 tablet by mouth 2 (two) times daily as needed for cough. Mason Sole, Pratik D, DO  Active   diltiazem  (CARDIZEM  CD) 180 MG 24 hr capsule [id number]  Take 1 capsule (180 mg total) by mouth daily. Mason Sole, Pratik D, DO  Active   folic acid  (FOLVITE ) 1 MG tablet [id number]  Take 1 tablet (1 mg total) by mouth daily. Mason Sole, Pratik D, DO  Active   HYDROcodone -acetaminophen  (NORCO/VICODIN) 5-325 MG tablet [id number] No Take 1 tablet by mouth every 12 (twelve) hours as needed for moderate pain (pain score 4-6) or severe pain (pain score 7-10). Meldon Sport, MD Unknown Active Self, Pharmacy Records  Ipratropium-Albuterol  (COMBIVENT RESPIMAT) 20-100 MCG/ACT AERS respimat [id number]  Inhale 1 puff into the lungs every 6 (six) hours as needed. Mason Sole, Pratik D, DO  Active   lidocaine  (XYLOCAINE ) 2 % jelly [id number] No Apply 1 Application topically as needed. Derrel Flies, MD Taking Active Self, Pharmacy Records           Med Note ([place], [facility]   Wed Jun 21, 2023  3:54 PM) As needed  lovastatin  (MEVACOR ) 20 MG tablet [id number] No Take 1 tablet (20 mg total) by mouth in the morning. Meldon Sport, MD 07/15/2023 Active Self, Pharmacy Records  metoprolol  tartrate (LOPRESSOR ) 50 MG tablet [id number]  Take 1 tablet (50 mg total) by mouth 2 (two) times daily. Mason Sole, Pratik D, DO  Active   midodrine (PROAMATINE) 5 MG tablet [id number]  Take 1 tablet (5 mg total) by mouth 3 (three) times daily with meals. Mason Sole, Pratik D, DO  Active   Propylene Glycol (SYSTANE COMPLETE OP) [id number] No  Place 1 drop into both eyes in the morning, at noon, and at bedtime. [provider] 07/15/2023 Active Self, Pharmacy Records  thiamine  (VITAMIN B-1) 100 MG tablet [id number]  Take 1 tablet (100 mg total) by mouth daily. Mason Sole, Pratik D, DO  Active   venlafaxine  XR (EFFEXOR -XR) 150 MG 24 hr capsule [id number] No Take 2 capsules (300 mg total) by mouth every  evening.  Patient taking differently: Take 150 mg by mouth daily.   Marilyne Shu, NP 07/15/2023 Active Self, Pharmacy Records           Med Note Timm Foot, Michae Aden   Fri Jul 01, 2022  1:58 PM)              Home Care and Equipment/Supplies: Were Home Health Services Ordered?: Yes Name of Home Health Agency:: unknown Has Agency set up a time to come to your home?: No Any new equipment or medical supplies ordered?: Yes Name of Medical supply agency?: unknown Were you able to get the equipment/medical supplies?: Yes Do you have any questions related to the use of the equipment/supplies?: No  Functional Questionnaire: Do you need assistance with bathing/showering or dressing?: No Do you need assistance with meal preparation?: No Do you need assistance with eating?: No Do you have difficulty maintaining continence: No Do you need assistance with getting out of bed/getting out of a chair/moving?: No Do you have difficulty managing or taking your medications?: No  Follow up appointments reviewed: PCP Follow-up appointment confirmed?: Yes Date of PCP follow-up appointment?: 08/03/23 Follow-up Provider: [facility] Follow-up appointment confirmed?: NA Do you need transportation to your follow-up appointment?: No Do you understand care options if your condition(s) worsen?: Yes-patient verbalized understanding    SIGNATURE Darrall Ellison, LPN [facility] Nurse Health Advisor Direct Dial [phone]

## 2023-07-20 NOTE — Telephone Encounter (Signed)
 Spoke with patient's daughter Burdette Carolin and set up a phone visit with Dr. Daisey Dryer to discuss Ms. Eberwein's recovery from recent hospitalization and plans for surgery with Dr. Daisey Dryer. Phone visit scheduled with patient's daughter Burdette Carolin for 6/16 at 4:30. Pt's daughter agreed to date and time and thanked the office for calling.

## 2023-07-21 ENCOUNTER — Other Ambulatory Visit: Payer: Self-pay | Admitting: Internal Medicine

## 2023-07-21 ENCOUNTER — Ambulatory Visit: Payer: Self-pay

## 2023-07-21 DIAGNOSIS — Z556 Problems related to health literacy: Secondary | ICD-10-CM | POA: Diagnosis not present

## 2023-07-21 DIAGNOSIS — G894 Chronic pain syndrome: Secondary | ICD-10-CM

## 2023-07-21 DIAGNOSIS — G629 Polyneuropathy, unspecified: Secondary | ICD-10-CM | POA: Diagnosis not present

## 2023-07-21 DIAGNOSIS — Z79891 Long term (current) use of opiate analgesic: Secondary | ICD-10-CM | POA: Diagnosis not present

## 2023-07-21 DIAGNOSIS — I480 Paroxysmal atrial fibrillation: Secondary | ICD-10-CM | POA: Diagnosis not present

## 2023-07-21 DIAGNOSIS — M48061 Spinal stenosis, lumbar region without neurogenic claudication: Secondary | ICD-10-CM | POA: Diagnosis not present

## 2023-07-21 DIAGNOSIS — M48062 Spinal stenosis, lumbar region with neurogenic claudication: Secondary | ICD-10-CM

## 2023-07-21 DIAGNOSIS — Z791 Long term (current) use of non-steroidal anti-inflammatories (NSAID): Secondary | ICD-10-CM | POA: Diagnosis not present

## 2023-07-21 DIAGNOSIS — M19011 Primary osteoarthritis, right shoulder: Secondary | ICD-10-CM | POA: Diagnosis not present

## 2023-07-21 DIAGNOSIS — Z86711 Personal history of pulmonary embolism: Secondary | ICD-10-CM | POA: Diagnosis not present

## 2023-07-21 DIAGNOSIS — Z9981 Dependence on supplemental oxygen: Secondary | ICD-10-CM | POA: Diagnosis not present

## 2023-07-21 DIAGNOSIS — E871 Hypo-osmolality and hyponatremia: Secondary | ICD-10-CM | POA: Diagnosis not present

## 2023-07-21 DIAGNOSIS — J449 Chronic obstructive pulmonary disease, unspecified: Secondary | ICD-10-CM | POA: Diagnosis not present

## 2023-07-21 DIAGNOSIS — E782 Mixed hyperlipidemia: Secondary | ICD-10-CM | POA: Diagnosis not present

## 2023-07-21 DIAGNOSIS — I119 Hypertensive heart disease without heart failure: Secondary | ICD-10-CM | POA: Diagnosis not present

## 2023-07-21 DIAGNOSIS — I700 Atherosclerosis of aorta: Secondary | ICD-10-CM | POA: Diagnosis not present

## 2023-07-21 DIAGNOSIS — E876 Hypokalemia: Secondary | ICD-10-CM | POA: Diagnosis not present

## 2023-07-21 DIAGNOSIS — A419 Sepsis, unspecified organism: Secondary | ICD-10-CM | POA: Diagnosis not present

## 2023-07-21 LAB — CULTURE, BLOOD (ROUTINE X 2): Special Requests: ADEQUATE

## 2023-07-21 MED ORDER — HYDROCODONE-ACETAMINOPHEN 5-325 MG PO TABS: 1.0000 | ORAL_TABLET | Freq: Two times a day (BID) | ORAL | 0 refills | Status: DC | PRN

## 2023-07-21 NOTE — Telephone Encounter (Signed)
 Chief Complaint: increase prescription Symptoms: pain Frequency: constant Pertinent Negatives: Patient denies tingling, numbness, vision changes Disposition: [] ED /[] Urgent Care (no appt availability in office) / [x] Appointment(In office/virtual)/ []  [place] [facility]/ [] Home Care/ [x] Refused Recommended Disposition /[] [place] [facility]/ []  Follow-up with PCP Additional Notes:  Called to request increase in Norco prescription, mentioned she sustained a fall, transferred for triage. Yesterday she fell when trying to change cat litter. She fell backward on  wooden floor, hit her head, did not lose consciousness. Immediate back pain. Today pain is generalized all over her body and severe. Due to "neuropathy" she needs assistance getting up, she   crawled to couch to pull herself up, she was able to get up on her own, settled herself and took an additional dose of Norco. She took extra Norco. She can't drive and refusing appointment today, she states she can go to urgent care on Saturday.   She wants another Norco prescription. She is feeling weak. Has in home PT today. Educated on care advice as documented in protocol, patient verbalized understanding. Patient would like Dr. Lydia Sams to send extra norco. Please follow up and advise on recommendation.    Copied from CRM [id number]. Topic: Clinical - Red Word Triage >> Jul 21, 2023 11:00 AM Hamp Levine R wrote: [facility] that prompted transfer to Nurse Triage: Patient had a fall yesterday afternoon and has had to increase her use of the HYDROcodone -acetaminophen  (NORCO/VICODIN) 5-325 MG tablet. Is looking to get a refill because she will be out of them by Monday. Patient was also released recently from the hospital with pneumonia. Reason for Disposition  [1] MODERATE weakness (i.e., interferes with work, school, normal activities) AND [2] new-onset or worsening  Protocols used: Falls and [facility]

## 2023-07-26 ENCOUNTER — Inpatient Hospital Stay: Admitting: Internal Medicine

## 2023-07-26 ENCOUNTER — Ambulatory Visit: Payer: Self-pay

## 2023-07-26 DIAGNOSIS — G629 Polyneuropathy, unspecified: Secondary | ICD-10-CM | POA: Diagnosis not present

## 2023-07-26 DIAGNOSIS — E871 Hypo-osmolality and hyponatremia: Secondary | ICD-10-CM | POA: Diagnosis not present

## 2023-07-26 DIAGNOSIS — A419 Sepsis, unspecified organism: Secondary | ICD-10-CM | POA: Diagnosis not present

## 2023-07-26 DIAGNOSIS — Z9981 Dependence on supplemental oxygen: Secondary | ICD-10-CM | POA: Diagnosis not present

## 2023-07-26 DIAGNOSIS — I700 Atherosclerosis of aorta: Secondary | ICD-10-CM | POA: Diagnosis not present

## 2023-07-26 DIAGNOSIS — E876 Hypokalemia: Secondary | ICD-10-CM | POA: Diagnosis not present

## 2023-07-26 DIAGNOSIS — E782 Mixed hyperlipidemia: Secondary | ICD-10-CM | POA: Diagnosis not present

## 2023-07-26 DIAGNOSIS — Z791 Long term (current) use of non-steroidal anti-inflammatories (NSAID): Secondary | ICD-10-CM | POA: Diagnosis not present

## 2023-07-26 DIAGNOSIS — I119 Hypertensive heart disease without heart failure: Secondary | ICD-10-CM | POA: Diagnosis not present

## 2023-07-26 DIAGNOSIS — J449 Chronic obstructive pulmonary disease, unspecified: Secondary | ICD-10-CM | POA: Diagnosis not present

## 2023-07-26 DIAGNOSIS — M48061 Spinal stenosis, lumbar region without neurogenic claudication: Secondary | ICD-10-CM | POA: Diagnosis not present

## 2023-07-26 DIAGNOSIS — I480 Paroxysmal atrial fibrillation: Secondary | ICD-10-CM | POA: Diagnosis not present

## 2023-07-26 DIAGNOSIS — M19011 Primary osteoarthritis, right shoulder: Secondary | ICD-10-CM | POA: Diagnosis not present

## 2023-07-26 DIAGNOSIS — Z79891 Long term (current) use of opiate analgesic: Secondary | ICD-10-CM | POA: Diagnosis not present

## 2023-07-26 DIAGNOSIS — Z556 Problems related to health literacy: Secondary | ICD-10-CM | POA: Diagnosis not present

## 2023-07-26 DIAGNOSIS — Z86711 Personal history of pulmonary embolism: Secondary | ICD-10-CM | POA: Diagnosis not present

## 2023-07-26 NOTE — Telephone Encounter (Signed)
 Patient transferred to Nurse Triage. Patient with Physical Therapy Assistant, [name], from [facility]. Patient's PTA states patient is reporting dizziness and a low heartrate of 52. This RN asked patient about upcoming appt today with Dr. Lydia Sams and patient states 'I can't come because I don't have the gas, unless you want to bring me money for gas'. This RN asked if patient would like for her to cancel the appt. Patient assisted with cancelling appt. This RN attempted triage but patient stated "I am dizzy all the time lady". This RN encouraged patient to call back if she would like to discuss symptoms. Patient states HR normally runs 70-72. Patient will call back to further discuss and reschedule appt. Will forward to clinic for follow up.   Copied from CRM [id number]. Topic: Clinical - Red Word Triage >> Jul 26, 2023  1:11 PM Opal Bill wrote: Red Word that prompted transfer to Nurse Triage: Dizziness and 52 pulse, which is outside of patient's perimeters. Answer Assessment - Initial Assessment Questions See notes  Protocols used: Difficult Call-A-AH

## 2023-07-26 NOTE — Telephone Encounter (Signed)
 Pt should go to ED for further evaluation, appt for today was cancelled.

## 2023-07-28 DIAGNOSIS — I119 Hypertensive heart disease without heart failure: Secondary | ICD-10-CM | POA: Diagnosis not present

## 2023-07-28 DIAGNOSIS — Z791 Long term (current) use of non-steroidal anti-inflammatories (NSAID): Secondary | ICD-10-CM | POA: Diagnosis not present

## 2023-07-28 DIAGNOSIS — M48061 Spinal stenosis, lumbar region without neurogenic claudication: Secondary | ICD-10-CM | POA: Diagnosis not present

## 2023-07-28 DIAGNOSIS — Z86711 Personal history of pulmonary embolism: Secondary | ICD-10-CM | POA: Diagnosis not present

## 2023-07-28 DIAGNOSIS — E871 Hypo-osmolality and hyponatremia: Secondary | ICD-10-CM | POA: Diagnosis not present

## 2023-07-28 DIAGNOSIS — A419 Sepsis, unspecified organism: Secondary | ICD-10-CM | POA: Diagnosis not present

## 2023-07-28 DIAGNOSIS — Z9981 Dependence on supplemental oxygen: Secondary | ICD-10-CM | POA: Diagnosis not present

## 2023-07-28 DIAGNOSIS — I480 Paroxysmal atrial fibrillation: Secondary | ICD-10-CM | POA: Diagnosis not present

## 2023-07-28 DIAGNOSIS — J449 Chronic obstructive pulmonary disease, unspecified: Secondary | ICD-10-CM | POA: Diagnosis not present

## 2023-07-28 DIAGNOSIS — M19011 Primary osteoarthritis, right shoulder: Secondary | ICD-10-CM | POA: Diagnosis not present

## 2023-07-28 DIAGNOSIS — I700 Atherosclerosis of aorta: Secondary | ICD-10-CM | POA: Diagnosis not present

## 2023-07-28 DIAGNOSIS — E876 Hypokalemia: Secondary | ICD-10-CM | POA: Diagnosis not present

## 2023-07-28 DIAGNOSIS — Z79891 Long term (current) use of opiate analgesic: Secondary | ICD-10-CM | POA: Diagnosis not present

## 2023-07-28 DIAGNOSIS — Z556 Problems related to health literacy: Secondary | ICD-10-CM | POA: Diagnosis not present

## 2023-07-28 DIAGNOSIS — E782 Mixed hyperlipidemia: Secondary | ICD-10-CM | POA: Diagnosis not present

## 2023-07-28 DIAGNOSIS — G629 Polyneuropathy, unspecified: Secondary | ICD-10-CM | POA: Diagnosis not present

## 2023-08-03 ENCOUNTER — Inpatient Hospital Stay: Admitting: Internal Medicine

## 2023-08-03 ENCOUNTER — Telehealth: Payer: Self-pay

## 2023-08-03 ENCOUNTER — Encounter (HOSPITAL_COMMUNITY): Admit: 2023-08-03

## 2023-08-03 DIAGNOSIS — Z791 Long term (current) use of non-steroidal anti-inflammatories (NSAID): Secondary | ICD-10-CM | POA: Diagnosis not present

## 2023-08-03 DIAGNOSIS — G629 Polyneuropathy, unspecified: Secondary | ICD-10-CM | POA: Diagnosis not present

## 2023-08-03 DIAGNOSIS — M19011 Primary osteoarthritis, right shoulder: Secondary | ICD-10-CM | POA: Diagnosis not present

## 2023-08-03 DIAGNOSIS — E876 Hypokalemia: Secondary | ICD-10-CM | POA: Diagnosis not present

## 2023-08-03 DIAGNOSIS — E782 Mixed hyperlipidemia: Secondary | ICD-10-CM | POA: Diagnosis not present

## 2023-08-03 DIAGNOSIS — I119 Hypertensive heart disease without heart failure: Secondary | ICD-10-CM | POA: Diagnosis not present

## 2023-08-03 DIAGNOSIS — M48061 Spinal stenosis, lumbar region without neurogenic claudication: Secondary | ICD-10-CM | POA: Diagnosis not present

## 2023-08-03 DIAGNOSIS — E871 Hypo-osmolality and hyponatremia: Secondary | ICD-10-CM | POA: Diagnosis not present

## 2023-08-03 DIAGNOSIS — J449 Chronic obstructive pulmonary disease, unspecified: Secondary | ICD-10-CM | POA: Diagnosis not present

## 2023-08-03 DIAGNOSIS — Z79891 Long term (current) use of opiate analgesic: Secondary | ICD-10-CM | POA: Diagnosis not present

## 2023-08-03 DIAGNOSIS — Z556 Problems related to health literacy: Secondary | ICD-10-CM | POA: Diagnosis not present

## 2023-08-03 DIAGNOSIS — Z9981 Dependence on supplemental oxygen: Secondary | ICD-10-CM | POA: Diagnosis not present

## 2023-08-03 DIAGNOSIS — I700 Atherosclerosis of aorta: Secondary | ICD-10-CM | POA: Diagnosis not present

## 2023-08-03 DIAGNOSIS — Z86711 Personal history of pulmonary embolism: Secondary | ICD-10-CM | POA: Diagnosis not present

## 2023-08-03 DIAGNOSIS — A419 Sepsis, unspecified organism: Secondary | ICD-10-CM | POA: Diagnosis not present

## 2023-08-03 DIAGNOSIS — I480 Paroxysmal atrial fibrillation: Secondary | ICD-10-CM | POA: Diagnosis not present

## 2023-08-03 NOTE — Telephone Encounter (Signed)
 Copied from CRM [id number]. Topic: General - Other >> Aug 03, 2023 10:07 AM [place] wrote: Reason for CRM: returning call from office, no message left.

## 2023-08-04 ENCOUNTER — Inpatient Hospital Stay: Payer: Self-pay

## 2023-08-04 ENCOUNTER — Ambulatory Visit: Payer: Self-pay | Admitting: Internal Medicine

## 2023-08-04 DIAGNOSIS — Z556 Problems related to health literacy: Secondary | ICD-10-CM | POA: Diagnosis not present

## 2023-08-04 DIAGNOSIS — E782 Mixed hyperlipidemia: Secondary | ICD-10-CM | POA: Diagnosis not present

## 2023-08-04 DIAGNOSIS — E871 Hypo-osmolality and hyponatremia: Secondary | ICD-10-CM | POA: Diagnosis not present

## 2023-08-04 DIAGNOSIS — Z791 Long term (current) use of non-steroidal anti-inflammatories (NSAID): Secondary | ICD-10-CM | POA: Diagnosis not present

## 2023-08-04 DIAGNOSIS — G629 Polyneuropathy, unspecified: Secondary | ICD-10-CM | POA: Diagnosis not present

## 2023-08-04 DIAGNOSIS — M19011 Primary osteoarthritis, right shoulder: Secondary | ICD-10-CM | POA: Diagnosis not present

## 2023-08-04 DIAGNOSIS — A419 Sepsis, unspecified organism: Secondary | ICD-10-CM | POA: Diagnosis not present

## 2023-08-04 DIAGNOSIS — E876 Hypokalemia: Secondary | ICD-10-CM | POA: Diagnosis not present

## 2023-08-04 DIAGNOSIS — J449 Chronic obstructive pulmonary disease, unspecified: Secondary | ICD-10-CM | POA: Diagnosis not present

## 2023-08-04 DIAGNOSIS — Z86711 Personal history of pulmonary embolism: Secondary | ICD-10-CM | POA: Diagnosis not present

## 2023-08-04 DIAGNOSIS — I480 Paroxysmal atrial fibrillation: Secondary | ICD-10-CM | POA: Diagnosis not present

## 2023-08-04 DIAGNOSIS — I119 Hypertensive heart disease without heart failure: Secondary | ICD-10-CM | POA: Diagnosis not present

## 2023-08-04 DIAGNOSIS — Z79891 Long term (current) use of opiate analgesic: Secondary | ICD-10-CM | POA: Diagnosis not present

## 2023-08-04 DIAGNOSIS — Z9981 Dependence on supplemental oxygen: Secondary | ICD-10-CM | POA: Diagnosis not present

## 2023-08-04 DIAGNOSIS — I700 Atherosclerosis of aorta: Secondary | ICD-10-CM | POA: Diagnosis not present

## 2023-08-04 DIAGNOSIS — M48061 Spinal stenosis, lumbar region without neurogenic claudication: Secondary | ICD-10-CM | POA: Diagnosis not present

## 2023-08-04 NOTE — Telephone Encounter (Signed)
 Chronic back pain  Symptoms: 10/10 pain level in lower back, pinched nerve per Laura Mcgee Frequency: "Constant but easing off" Pertinent Negatives: Patient denies pain radiation  Disposition: [x] ED /[x] Urgent Care (no appt availability in office)   Additional Notes: Spoke with Laura Mcgee's PTA, [name]. Laura Mcgee had run errands today. Laura Mcgee took a hydrocodone  a little while ago and the back pain is easing off. This RN advised Laura Mcgee to go to an urgent care if pain persists or to go to ED if pain worsens. Laura Mcgee states understanding.    Copied from CRM [id number]. Topic: Clinical - Red Word Triage >> Aug 04, 2023  4:25 PM Chrystal Crape R wrote: Laura Mcgee home health physical therapist assist [name] and Laura Mcgee calling to make office aware that the Laura Mcgee pain level was a 10 out of 10 when he arrived. Reason for Disposition  [1] SEVERE back pain (e.g., excruciating, unable to do any normal activities) AND [2] not improved 2 hours after pain medicine  Answer Assessment - Initial Assessment Questions 1. ONSET: "When did the pain begin?"      Chronic back pain 2. LOCATION: "Where does it hurt?" (upper, mid or lower back)     Lower back pain 3. SEVERITY: "How bad is the pain?"  (e.g., Scale 1-10; mild, moderate, or severe)   - MILD (1-3): Doesn't interfere with normal activities.    - MODERATE (4-7): Interferes with normal activities or awakens from sleep.    - SEVERE (8-10): Excruciating pain, unable to do any normal activities.      10/10 pain 4. PATTERN: "Is the pain constant?" (e.g., yes, no; constant, intermittent)      "Constant right now but easing off a little bit" 5. RADIATION: "Does the pain shoot into your legs or somewhere else?"     No 6. CAUSE:  "What do you think is causing the back pain?"      Pinched nerve 8. MEDICINES: "What have you taken so far for the pain?" (e.g., nothing, acetaminophen , NSAIDS)     Hydrocodone   Protocols used: Back Pain-A-AH

## 2023-08-11 DIAGNOSIS — Z86711 Personal history of pulmonary embolism: Secondary | ICD-10-CM | POA: Diagnosis not present

## 2023-08-11 DIAGNOSIS — I480 Paroxysmal atrial fibrillation: Secondary | ICD-10-CM | POA: Diagnosis not present

## 2023-08-11 DIAGNOSIS — Z791 Long term (current) use of non-steroidal anti-inflammatories (NSAID): Secondary | ICD-10-CM | POA: Diagnosis not present

## 2023-08-11 DIAGNOSIS — G629 Polyneuropathy, unspecified: Secondary | ICD-10-CM | POA: Diagnosis not present

## 2023-08-11 DIAGNOSIS — E871 Hypo-osmolality and hyponatremia: Secondary | ICD-10-CM | POA: Diagnosis not present

## 2023-08-11 DIAGNOSIS — M48061 Spinal stenosis, lumbar region without neurogenic claudication: Secondary | ICD-10-CM | POA: Diagnosis not present

## 2023-08-11 DIAGNOSIS — Z9981 Dependence on supplemental oxygen: Secondary | ICD-10-CM | POA: Diagnosis not present

## 2023-08-11 DIAGNOSIS — Z79891 Long term (current) use of opiate analgesic: Secondary | ICD-10-CM | POA: Diagnosis not present

## 2023-08-11 DIAGNOSIS — E876 Hypokalemia: Secondary | ICD-10-CM | POA: Diagnosis not present

## 2023-08-11 DIAGNOSIS — M19011 Primary osteoarthritis, right shoulder: Secondary | ICD-10-CM | POA: Diagnosis not present

## 2023-08-11 DIAGNOSIS — I119 Hypertensive heart disease without heart failure: Secondary | ICD-10-CM | POA: Diagnosis not present

## 2023-08-11 DIAGNOSIS — I700 Atherosclerosis of aorta: Secondary | ICD-10-CM | POA: Diagnosis not present

## 2023-08-11 DIAGNOSIS — A419 Sepsis, unspecified organism: Secondary | ICD-10-CM | POA: Diagnosis not present

## 2023-08-11 DIAGNOSIS — E782 Mixed hyperlipidemia: Secondary | ICD-10-CM | POA: Diagnosis not present

## 2023-08-11 DIAGNOSIS — Z556 Problems related to health literacy: Secondary | ICD-10-CM | POA: Diagnosis not present

## 2023-08-11 DIAGNOSIS — J449 Chronic obstructive pulmonary disease, unspecified: Secondary | ICD-10-CM | POA: Diagnosis not present

## 2023-08-15 ENCOUNTER — Ambulatory Visit: Admit: 2023-08-15 | Admitting: Psychiatry

## 2023-08-15 ENCOUNTER — Ambulatory Visit (INDEPENDENT_AMBULATORY_CARE_PROVIDER_SITE_OTHER): Payer: Self-pay | Admitting: Internal Medicine

## 2023-08-15 ENCOUNTER — Encounter: Payer: Self-pay | Admitting: Internal Medicine

## 2023-08-15 VITALS — BP 134/78 | HR 76 | Ht 66.0 in | Wt 149.2 lb

## 2023-08-15 DIAGNOSIS — E876 Hypokalemia: Secondary | ICD-10-CM | POA: Diagnosis not present

## 2023-08-15 DIAGNOSIS — R9389 Abnormal findings on diagnostic imaging of other specified body structures: Secondary | ICD-10-CM

## 2023-08-15 DIAGNOSIS — I10 Essential (primary) hypertension: Secondary | ICD-10-CM

## 2023-08-15 DIAGNOSIS — I480 Paroxysmal atrial fibrillation: Secondary | ICD-10-CM

## 2023-08-15 DIAGNOSIS — J189 Pneumonia, unspecified organism: Secondary | ICD-10-CM | POA: Diagnosis not present

## 2023-08-15 SURGERY — DILATATION AND CURETTAGE /HYSTEROSCOPY
Anesthesia: General

## 2023-08-15 NOTE — Assessment & Plan Note (Signed)
 Presenting today for hospital follow-up after recent admission 5/11 - 5/14 in the setting of sepsis secondary to multifocal pneumonia.  Empirically treated with Rocephin /azithromycin  and was transition to cefdinir /azithromycin  upon discharge.  She completed antibiotics as prescribed.  She will return to care for routine follow-up with Dr. Lydia Sams on 7/3.

## 2023-08-15 NOTE — Progress Notes (Signed)
 Established Patient Office Visit  Subjective   Patient ID: Laura Mcgee, female    DOB: 1950/04/26  Age: 73 y.o. MRN: [id number]  Chief Complaint  Patient presents with   Hospitalization Follow-up    Hospital follow up for pneumonia    Laura Mcgee returns to care today for hospital follow-up.  Recent hospital admission 5/11 - 5/14 after presenting to the emergency department on 5/11 endorsing left-sided chest and flank pain worse with deep inspiration.  She was ultimately admitted for severe sepsis secondary to multifocal pneumonia.  Empirically treated with IV antibiotics and was transitioned to azithromycin  and cefdinir  upon discharge.  Home antihypertensive medications were held due to hypotension during admission.  There have been no acute interval events since hospital discharge.  Laura Mcgee reports feeling well today.  Chest discomfort has resolved and her respiratory status has returned to baseline.  She does not have any additional concerns to discuss today.  Past Medical History:  Diagnosis Date   Arthritis    Atrial fibrillation (HCC)    Cancer (HCC)    Vaginal   COPD (chronic obstructive pulmonary disease) (HCC)    Depression    Difficult intubation    History of laryngospasm   Dysrhythmia    GERD (gastroesophageal reflux disease)    Hyperlipidemia    Hypertension    Numbness and tingling    Pulmonary embolism (HCC) 1973   Past Surgical History:  Procedure Laterality Date   CERVIX LESION DESTRUCTION     throat   COLONOSCOPY WITH PROPOFOL  N/A 04/07/2015   SLF: 1. seven colorectal polyps removed 2. the left colon is redundant 3. small internal hemorrhoids (1 simple adenoma, 6 hyperplastic) . Repeat in 5 years.   CYST EXCISION Left    Arm   ESOPHAGOGASTRODUODENOSCOPY (EGD) WITH PROPOFOL  N/A 04/07/2015   SLF: 1. Schatzki ring 2. Bravo capsule 34 cm from the teeth 3. moderate non-erosive gastritis.    HARDWARE REMOVAL Right 12/05/2022   Procedure: RIGHT HUMERUS HARDWARE  REMOVAL;  Surgeon: Tonita Frater, MD;  Location: [facility];  Service: Orthopedics;  Laterality: Right;  NEEDS RNFA   HUMERUS IM NAIL Right 06/25/2021   Procedure: INTRAMEDULLARY (IM) NAIL HUMERAL;  Surgeon: Tonita Frater, MD;  Location: [facility];  Service: Orthopedics;  Laterality: Right;   MASS EXCISION N/A 2002   [facility]   MASS EXCISION Left 01/21/2015   Procedure: EXCISION CYST LEFT UPPER ARM;  Surgeon: Alanda Allegra, MD;  Location: [facility];  Service: General;  Laterality: Left;   MICROLARYNGOSCOPY N/A 05/31/2016   Procedure: MICRO LARYNGOSCOPY WITH BIOPSY OF VOCAL CORD LESION;  Surgeon: Reynold Caves, MD;  Location: [facility];  Service: ENT;  Laterality: N/A;   POLYPECTOMY N/A 04/07/2015   Procedure: POLYPECTOMY;  Surgeon: Alyce Jubilee, MD;  Location: [facility];  Service: Endoscopy;  Laterality: N/A;  Descending colon polyps x 3    SAVORY DILATION N/A 04/07/2015   Procedure: SAVORY DILATION;  Surgeon: Alyce Jubilee, MD;  Location: [facility];  Service: Endoscopy;  Laterality: N/A;   VULVECTOMY N/A 07/12/2022   Procedure: SIMPLE PARTIAL VULVECTOMY, ADVANCEMENT FLAP;  Surgeon: Derrel Flies, MD;  Location: [facility];  Service: Gynecology;  Laterality: N/A;   Social History   Tobacco Use   Smoking status: Every Day    Current packs/day: 1.00    Average packs/day: 1 pack/day for 25.0 years (25.0 ttl pk-yrs)    Types: Cigarettes    Passive exposure: Current   Smokeless  tobacco: Never   Tobacco comments:    1 ppd as of 06/08/23  Vaping Use   Vaping status: Never Used  Substance Use Topics   Alcohol use: Yes    Comment: Drinks two  20 oz beer per night.   Drug use: Not Currently    Types: Marijuana    Comment: daily marijuana use, not since 03/2022   Family History  Problem Relation Age of Onset   Pneumonia Mother    Aneurysm Father    Kidney cancer Sister    Lung cancer Sister    Esophageal cancer Brother    Melanoma Maternal Grandfather    Diabetes Daughter     Colon cancer Neg Hx    Breast cancer Neg Hx    Ovarian cancer Neg Hx    Endometrial cancer Neg Hx    Pancreatic cancer Neg Hx    Prostate cancer Neg Hx    Allergies  Allergen Reactions   Gabapentin  Other (See Comments)    dizziness   Tetracyclines & Related Other (See Comments)    Makes her sick on her stomach    Review of Systems  Constitutional:  Negative for chills and fever.  HENT:  Negative for sore throat.   Respiratory:  Negative for cough and shortness of breath.   Cardiovascular:  Negative for chest pain, palpitations and leg swelling.  Gastrointestinal:  Negative for abdominal pain, blood in stool, constipation, diarrhea, nausea and vomiting.  Genitourinary:  Negative for dysuria and hematuria.  Musculoskeletal:  Positive for back pain (Chronic lumbar back pain). Negative for myalgias.  Skin:  Negative for itching and rash.  Neurological:  Negative for dizziness and headaches.  Psychiatric/Behavioral:  Negative for depression and suicidal ideas.      Objective:     BP 134/78   Pulse 76   Ht 5\' 6"  (1.676 m)   Wt 149 lb 3.2 oz (67.7 kg)   LMP 06/20/1997 (Approximate) Comment: post menopausal  SpO2 91%   BMI 24.08 kg/m  BP Readings from Last 3 Encounters:  08/15/23 134/78  07/19/23 121/77  06/26/23 131/81   Physical Exam Vitals reviewed.  Constitutional:      General: She is not in acute distress.    Appearance: Normal appearance. She is not toxic-appearing.  HENT:     Head: Normocephalic and atraumatic.     Right Ear: External ear normal.     Left Ear: External ear normal.     Nose: Nose normal. No congestion or rhinorrhea.     Mouth/Throat:     Mouth: Mucous membranes are moist.     Pharynx: Oropharynx is clear. No oropharyngeal exudate or posterior oropharyngeal erythema.  Eyes:     General: No scleral icterus.    Extraocular Movements: Extraocular movements intact.     Conjunctiva/sclera: Conjunctivae normal.     Pupils: Pupils are equal, round,  and reactive to light.  Cardiovascular:     Rate and Rhythm: Normal rate and regular rhythm.     Pulses: Normal pulses.     Heart sounds: Normal heart sounds. No murmur heard.    No friction rub. No gallop.  Pulmonary:     Effort: Pulmonary effort is normal.     Breath sounds: Normal breath sounds. No wheezing, rhonchi or rales.  Abdominal:     General: Abdomen is flat. Bowel sounds are normal. There is no distension.     Palpations: Abdomen is soft.     Tenderness: There is no abdominal tenderness.  Musculoskeletal:        General: No swelling. Normal range of motion.     Cervical back: Normal range of motion.     Right lower leg: No edema.     Left lower leg: No edema.  Lymphadenopathy:     Cervical: No cervical adenopathy.  Skin:    General: Skin is warm and dry.     Capillary Refill: Capillary refill takes less than 2 seconds.     Coloration: Skin is not jaundiced.  Neurological:     General: No focal deficit present.     Mental Status: She is alert and oriented to person, place, and time.     Gait: Gait abnormal (Ambulates with a cane).  Psychiatric:        Mood and Affect: Mood normal.        Behavior: Behavior normal.   Last CBC Lab Results  Component Value Date   WBC 9.2 07/19/2023   HGB 12.6 07/19/2023   HCT 37.3 07/19/2023   MCV 96.6 07/19/2023   MCH 32.6 07/19/2023   RDW 14.5 07/19/2023   PLT 176 07/19/2023   Last metabolic panel Lab Results  Component Value Date   GLUCOSE 87 07/19/2023   NA 136 07/19/2023   K 3.2 (L) 07/19/2023   CL 105 07/19/2023   CO2 23 07/19/2023   BUN 12 07/19/2023   CREATININE 0.67 07/19/2023   GFRNONAA >60 07/19/2023   CALCIUM 8.6 (L) 07/19/2023   PHOS 2.2 (L) 06/27/2021   PROT 6.4 (L) 07/16/2023   ALBUMIN 2.9 (L) 07/16/2023   LABGLOB 2.6 06/08/2023   AGRATIO 1.2 04/03/2019   BILITOT 0.9 07/16/2023   ALKPHOS 88 07/16/2023   AST 16 07/16/2023   ALT 14 07/16/2023   ANIONGAP 8 07/19/2023   Last lipids Lab Results   Component Value Date   CHOL 170 06/08/2023   HDL 82 06/08/2023   LDLCALC 76 06/08/2023   TRIG 63 06/08/2023   CHOLHDL 2.1 06/08/2023   Last hemoglobin A1c Lab Results  Component Value Date   HGBA1C 5.5 04/03/2019   Last thyroid functions Lab Results  Component Value Date   TSH 0.555 06/08/2023   Last vitamin B12 and Folate Lab Results  Component Value Date   VITAMINB12 594 04/03/2019   The 10-year ASCVD risk score (Arnett DK, et al., 2019) is: 26%    Assessment & Plan:   Problem List Items Addressed This Visit       Essential hypertension (Chronic)   Home antihypertensive regimen of amlodipine  and olmesartan  was held during recent hospital admission in the setting of mild hypotension.  She was ultimately discharged on midodrine  but stopped taking it shortly after discharge.  She has resumed olmesartan  and amlodipine .  BP today is 134/78.  No additional medication changes are indicated at this time.      Paroxysmal A-fib (HCC) (Chronic)   Regular rate and rhythm detected on exam today.  Not currently prescribed oral anticoagulation as risks felt to outweigh benefits in the setting of alcohol abuse.  Discharge from recent hospital admission on metoprolol  tartrate 50 mg twice daily, which she discontinued shortly after hospital discharge due to adverse side effects.  She was not on rate control therapy prior to hospital admission.  Documented pulse at today's visit was 76.  No indication to resume rate control therapy.  Cardiology follow-up is scheduled for tomorrow (6/11).      Multifocal pneumonia   Presenting today for hospital follow-up after recent admission 5/11 -  5/14 in the setting of sepsis secondary to multifocal pneumonia.  Empirically treated with Rocephin /azithromycin  and was transition to cefdinir /azithromycin  upon discharge.  She completed antibiotics as prescribed.  She will return to care for routine follow-up with Dr. Lydia Sams on 7/3.      Hypokalemia - Primary    Noted on labs prior to hospital discharge.  Repeat BMP ordered today.      Return in about 23 days (around 09/07/2023).   Tobi Fortes, MD

## 2023-08-15 NOTE — Assessment & Plan Note (Signed)
 Regular rate and rhythm detected on exam today.  Not currently prescribed oral anticoagulation as risks felt to outweigh benefits in the setting of alcohol abuse.  Discharge from recent hospital admission on metoprolol  tartrate 50 mg twice daily, which she discontinued shortly after hospital discharge due to adverse side effects.  She was not on rate control therapy prior to hospital admission.  Documented pulse at today's visit was 76.  No indication to resume rate control therapy.  Cardiology follow-up is scheduled for tomorrow (6/11).

## 2023-08-15 NOTE — Assessment & Plan Note (Signed)
 Home antihypertensive regimen of amlodipine  and olmesartan  was held during recent hospital admission in the setting of mild hypotension.  She was ultimately discharged on midodrine  but stopped taking it shortly after discharge.  She has resumed olmesartan  and amlodipine .  BP today is 134/78.  No additional medication changes are indicated at this time.

## 2023-08-15 NOTE — Patient Instructions (Signed)
 It was a pleasure to see you today.  Thank you for giving us  the opportunity to be involved in your care.  Below is a brief recap of your visit and next steps.  We will plan to see you again on 09/07/23.  Summary Hospital follow up today Continue amlodipine  and olmesartan  for hypertension Discontinue midodrine  and metoprolol  Repeat chemistry panel ordered today Follow up as scheduled with Dr. Lydia Sams on 7/3

## 2023-08-15 NOTE — Assessment & Plan Note (Signed)
 Noted on labs prior to hospital discharge.  Repeat BMP ordered today.

## 2023-08-16 ENCOUNTER — Ambulatory Visit: Attending: Internal Medicine | Admitting: Internal Medicine

## 2023-08-16 ENCOUNTER — Other Ambulatory Visit: Payer: Self-pay | Admitting: Internal Medicine

## 2023-08-16 ENCOUNTER — Ambulatory Visit: Attending: Internal Medicine

## 2023-08-16 ENCOUNTER — Ambulatory Visit: Payer: Self-pay | Admitting: Internal Medicine

## 2023-08-16 ENCOUNTER — Encounter: Payer: Self-pay | Admitting: Internal Medicine

## 2023-08-16 VITALS — BP 140/90 | HR 77 | Ht 66.0 in | Wt 150.2 lb

## 2023-08-16 DIAGNOSIS — I480 Paroxysmal atrial fibrillation: Secondary | ICD-10-CM

## 2023-08-16 DIAGNOSIS — R0609 Other forms of dyspnea: Secondary | ICD-10-CM | POA: Insufficient documentation

## 2023-08-16 LAB — BASIC METABOLIC PANEL WITH GFR
BUN/Creatinine Ratio: 17 (ref 12–28)
BUN: 12 mg/dL (ref 8–27)
CO2: 20 mmol/L (ref 20–29)
Calcium: 9.6 mg/dL (ref 8.7–10.3)
Chloride: 102 mmol/L (ref 96–106)
Creatinine, Ser: 0.7 mg/dL (ref 0.57–1.00)
Glucose: 84 mg/dL (ref 70–99)
Potassium: 4.4 mmol/L (ref 3.5–5.2)
Sodium: 137 mmol/L (ref 134–144)
eGFR: 91 mL/min/{1.73_m2} (ref >59)

## 2023-08-16 MED ORDER — FUROSEMIDE 20 MG PO TABS: 20.0000 mg | ORAL_TABLET | Freq: Every day | ORAL | 5 refills | Status: DC | PRN

## 2023-08-16 NOTE — Patient Instructions (Addendum)
 Medication Instructions:  Your physician has recommended you make the following change in your medication:  Start taking Furosemide 20 mg once daily as needed  Continue taking all other medications as prescribed  Labwork: None  Testing/Procedures: Your physician has recommended that you wear a Zio monitor.   This monitor is a medical device that records the heart's electrical activity. Doctors most often use these monitors to diagnose arrhythmias. Arrhythmias are problems with the speed or rhythm of the heartbeat. The monitor is a small device applied to your chest. You can wear one while you do your normal daily activities. While wearing this monitor if you have any symptoms to push the button and record what you felt. Once you have worn this monitor for the period of time provider prescribed (for 14 days), you will return the monitor device in the postage paid box. Once it is returned they will download the data collected and provide us  with a report which the provider will then review and we will call you with those results. Important tips:  Avoid showering during the first 24 hours of wearing the monitor. Avoid excessive sweating to help maximize wear time. Do not submerge the device, no hot tubs, and no swimming pools. Keep any lotions or oils away from the patch. After 24 hours you may shower with the patch on. Take brief showers with your back facing the shower head.  Do not remove patch once it has been placed because that will interrupt data and decrease adhesive wear time. Push the button when you have any symptoms and write down what you were feeling. Once you have completed wearing your monitor, remove and place into box which has postage paid and place in your outgoing mailbox.  If for some reason you have misplaced your box then call our office and we can provide another box and/or mail it off for you.   Follow-Up: Your physician recommends that you schedule a follow-up  appointment in: 1 year. You will receive a reminder call in about 8 months reminding you to schedule your appointment. If you don't receive this call, please contact our office.   Any Other Special Instructions Will Be Listed Below (If Applicable). Thank you for choosing [facility]!     If you need a refill on your cardiac medications before your next appointment, please call your pharmacy.

## 2023-08-16 NOTE — Progress Notes (Deleted)
 GI Office Note    Referring Provider: Meldon Sport, MD Primary Care Physician:  Meldon Sport, MD Primary Gastroenterologist: Rolando Cliche. Mordechai April, DO  Date:  08/17/2023  ID:  Aloma Arrant, DOB 12/26/1950, MRN [id number]   Chief Complaint   No chief complaint on file.  History of Present Illness  Ki Luckman is a 73 y.o. female with a history of depression, A-fib, COPD, HLD, HTN, PE 1973, alcohol abuse, vaginal cancer, GERD, laryngeal spasm, and chronic RUQ pain presenting today with complaint of ***  EGD and colonoscopy January 2017. She had a Schatzki ring, Bravo capsule placed, moderate nonerosive gastritis. Bravo study showed nonacid reflux on PPI. 7 colorectal polyps removed, one was a simple adenoma the others hyperplastic. Left colon redundant. Small internal hemorrhoids. Next colonoscopy 5 years (2022).   OV 05/03/22.  Has urge use bathroom in the mornings has a commode for a while but then unable to go.  States this initially hard to push out stools and then her stools will be large.  Continues to have the urgency.  Denies eating during the day, admits to eating after 6 PM as this is a chronic habit.  When eating at night she has pain and nausea.  Sometimes when she does eat she has nausea and pain usually occurs in the right upper quadrant and is intermittent.  Declined colonoscopy.  Denied any melena or BRBPR.  Reported 10 pound weight loss within the prior 6 weeks and decreased appetite.  Offered Cologuard as she refused colonoscopy.  Advise she may benefit from a an upper endoscopy.  Check LFTs and order right upper quadrant ultrasound.  Advised Colace twice daily and MiraLAX  nightly.   RUQ US  05/11/22: -Positive Murphy sign noted by sonographer -CBD measuring 4 mm -Fatty liver -Advised CT scan of the abdomen pelvis if she did not want to try pantoprazole  or omeprazole .   Last OV 01/16/23.  Complaints of lack of appetite.  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  Having pain in the right upper quadrant mostly and comes and goes and at times recently has felt more frequent.  No vomiting.  Only eating half meals that she prepares.  Drinks milk with every meal.  Does not like chicken or [place], primarily eating fish, sometimes pork or beef.  Has been feeling the need to strain but at times will have urgency and the pain goes away.  Taking senna docusate sodium  combo at times, try not to be dependent on it.  Has not tried any MiraLAX .  Did not feel as though pantoprazole  or omeprazole  were helpful. Advised to start famotidine 20 mg once daily. Miralax  nightly. GERD diet. Recommended protein shakes. CT A/P ordered. Advised will re discuss colonoscopy and EGD for evaluation of weight loss.    CT A/P 02/08/23 IMPRESSION: No evidence of biliary obstruction. Endometrial thickening or fluid within the endometrial canal, abnormal for postmenopausal state. Recommend pelvic ultrasound for further evaluation. 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 No suspicious renal mass, stones or hydronephrosis.  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 Visit 06/26/23 to discuss sampling of endometrial thickening.   Hospitalization 5/11-5/14 at [facility]. Refused SNF, given home PT/OT. Had some hypotension and was treated with midodrine . Discharged with home oxygen and need for echo without acute findings. Noted sepsis and pneumonia. Treated with IV abx. Some BP meds were  discontinued.   Today:    Wt Readings from Last 15 Encounters:  08/16/23 150 lb 3.2 oz (68.1 kg)  08/15/23 149 lb 3.2 oz (67.7 kg)  07/18/23 158 lb 8.2 oz (71.9 kg)  06/26/23 151 lb (68.5 kg)  06/08/23 152 lb 9.6 oz (69.2 kg)  05/29/23 152 lb (68.9 kg)  03/21/23 153 lb 3.2 oz (69.5 kg)  01/16/23 152 lb 9.6 oz (69.2 kg)  01/11/23 152 lb (68.9 kg)  12/05/22 154 lb 5.2 oz (70 kg)   12/01/22 154 lb 6.4 oz (70 kg)  11/21/22 154 lb 6.4 oz (70 kg)  08/30/22 165 lb (74.8 kg)  08/03/22 164 lb 12.8 oz (74.8 kg)  07/12/22 160 lb (72.6 kg)    Current Outpatient Medications  Medication Sig Dispense Refill   amLODipine  (NORVASC ) 5 MG tablet Take 5 mg by mouth daily.     Apoaequorin (PREVAGEN PO) Take 1 tablet by mouth daily.     baclofen  (LIORESAL ) 10 MG tablet Take 1 tablet (10 mg total) by mouth daily as needed for muscle spasms (laryngospasm).     celecoxib  (CELEBREX ) 200 MG capsule Take 1 capsule (200 mg total) by mouth 2 (two) times daily. 60 capsule 3   dextromethorphan-guaiFENesin  (MUCINEX  DM) 30-600 MG 12hr tablet Take 1 tablet by mouth 2 (two) times daily as needed for cough. 10 tablet 0   folic acid  (FOLVITE ) 1 MG tablet Take 1 tablet (1 mg total) by mouth daily. 30 tablet 0   furosemide (LASIX) 20 MG tablet Take 1 tablet (20 mg total) by mouth daily as needed. 30 tablet 5   HYDROcodone -acetaminophen  (NORCO/VICODIN) 5-325 MG tablet Take 1 tablet by mouth every 12 (twelve) hours as needed for moderate pain (pain score 4-6) or severe pain (pain score 7-10). 60 tablet 0   Ipratropium-Albuterol  (COMBIVENT  RESPIMAT) 20-100 MCG/ACT AERS respimat Inhale 1 puff into the lungs every 6 (six) hours as needed. 4 g 1   lidocaine  (XYLOCAINE ) 2 % jelly Apply 1 Application topically as needed. 30 mL 3   lovastatin  (MEVACOR ) 20 MG tablet Take 1 tablet (20 mg total) by mouth in the morning. 90 tablet 1   olmesartan  (BENICAR ) 20 MG tablet Take 20 mg by mouth daily.     Propylene Glycol (SYSTANE COMPLETE OP) Place 1 drop into both eyes in the morning, at noon, and at bedtime.     thiamine  (VITAMIN B-1) 100 MG tablet Take 1 tablet (100 mg total) by mouth daily. 30 tablet 0   venlafaxine  XR (EFFEXOR -XR) 150 MG 24 hr capsule Take 2 capsules (300 mg total) by mouth every evening. (Patient taking differently: Take 150 mg by mouth daily.) 60 capsule 0   No current facility-administered  medications for this visit.    Past Medical History:  Diagnosis Date   Arthritis    Atrial fibrillation (HCC)    Cancer (HCC)    Vaginal   COPD (chronic obstructive pulmonary disease) (HCC)    Depression    Difficult intubation    History of laryngospasm   Dysrhythmia    GERD (gastroesophageal reflux disease)    Hyperlipidemia    Hypertension    Numbness and tingling    Pulmonary embolism (HCC) 1973    Past Surgical History:  Procedure Laterality Date   CERVIX LESION DESTRUCTION     throat   COLONOSCOPY WITH PROPOFOL  N/A 04/07/2015   SLF: 1. seven colorectal polyps removed 2. the left colon is redundant 3. small internal hemorrhoids (1 simple adenoma, 6 hyperplastic) .  Repeat in 5 years.   CYST EXCISION Left    Arm   ESOPHAGOGASTRODUODENOSCOPY (EGD) WITH PROPOFOL  N/A 04/07/2015   SLF: 1. Schatzki ring 2. Bravo capsule 34 cm from the teeth 3. moderate non-erosive gastritis.    HARDWARE REMOVAL Right 12/05/2022   Procedure: RIGHT HUMERUS HARDWARE REMOVAL;  Surgeon: Tonita Frater, MD;  Location: [facility];  Service: Orthopedics;  Laterality: Right;  NEEDS RNFA   HUMERUS IM NAIL Right 06/25/2021   Procedure: INTRAMEDULLARY (IM) NAIL HUMERAL;  Surgeon: Tonita Frater, MD;  Location: [facility];  Service: Orthopedics;  Laterality: Right;   MASS EXCISION N/A 2002   Lanyx   MASS EXCISION Left 01/21/2015   Procedure: EXCISION CYST LEFT UPPER ARM;  Surgeon: Alanda Allegra, MD;  Location: [facility];  Service: General;  Laterality: Left;   MICROLARYNGOSCOPY N/A 05/31/2016   Procedure: MICRO LARYNGOSCOPY WITH BIOPSY OF VOCAL CORD LESION;  Surgeon: Reynold Caves, MD;  Location: [facility];  Service: ENT;  Laterality: N/A;   POLYPECTOMY N/A 04/07/2015   Procedure: POLYPECTOMY;  Surgeon: Alyce Jubilee, MD;  [facility];  Service: Endoscopy;  Laterality: N/A;  Descending colon polyps x 3    SAVORY DILATION N/A 04/07/2015   Procedure: SAVORY DILATION;  Surgeon: Alyce Jubilee,  MD;  [facility];  Service: Endoscopy;  Laterality: N/A;   VULVECTOMY N/A 07/12/2022   Procedure: SIMPLE PARTIAL VULVECTOMY, ADVANCEMENT FLAP;  Surgeon: Derrel Flies, MD;  Location: [facility];  Service: Gynecology;  Laterality: N/A;    Family History  Problem Relation Age of Onset   Pneumonia Mother    Aneurysm Father    Kidney cancer Sister    Lung cancer Sister    Esophageal cancer Brother    Melanoma Maternal Grandfather    Diabetes Daughter    Colon cancer Neg Hx    Breast cancer Neg Hx    Ovarian cancer Neg Hx    Endometrial cancer Neg Hx    Pancreatic cancer Neg Hx    Prostate cancer Neg Hx     Allergies as of 08/17/2023 - Review Complete 08/15/2023  Allergen Reaction Noted   Gabapentin  Other (See Comments) 05/22/2019   Tetracyclines & related Other (See Comments) 01/15/2015    Social History   Socioeconomic History   Marital status: Single    Spouse name: Not on file   Number of children: 1   Years of education: some college   Highest education level: Not on file  Occupational History   Occupation: Retired  Tobacco Use   Smoking status: Every Day    Current packs/day: 1.00    Average packs/day: 1 pack/day for 25.0 years (25.0 ttl pk-yrs)    Types: Cigarettes    Passive exposure: Current   Smokeless tobacco: Never   Tobacco comments:    1 ppd as of 06/08/23  Vaping Use   Vaping status: Never Used  Substance and Sexual Activity   Alcohol use: Yes    Comment: Drinks two  20 oz beer per night.   Drug use: Not Currently    Types: Marijuana    Comment: daily marijuana use, not since 03/2022   Sexual activity: Not Currently    Birth control/protection: Post-menopausal  Other Topics Concern   Not on file  Social History Narrative   Lives at home alone.   Right-handed.   Two cups sweet tea daily.   Social Drivers of Health   Financial Resource Strain: Low Risk  (11/30/2022)  Received from [facility]   Overall Financial Resource Strain  (CARDIA)    Difficulty of Paying Living Expenses: Not hard at all  Food Insecurity: No Food Insecurity (07/17/2023)   Hunger Vital Sign    Worried About Running Out of Food in the Last Year: Never true    Ran Out of Food in the Last Year: Never true  Transportation Needs: Unmet Transportation Needs (07/17/2023)   PRAPARE - Transportation    Lack of Transportation (Medical): Yes    Lack of Transportation (Non-Medical): Yes  Physical Activity: Sufficiently Active (11/30/2022)   Received from [facility]   Exercise Vital Sign    Days of Exercise per Week: 5 days    Minutes of Exercise per Session: 30 min  Stress: No Stress Concern Present (11/30/2022)   Received from [facility] - Occupational Stress Questionnaire    Feeling of Stress : Not at all  Social Connections: Socially Isolated (07/17/2023)   Social Connection and Isolation Panel    Frequency of Communication with Friends and Family: Once a week    Frequency of Social Gatherings with Friends and Family: Once a week    Attends Religious Services: 1 to 4 times per year    Active Member of [employer] or Organizations: No    Attends Banker Meetings: Never    Marital Status: Divorced     Review of Systems   Gen: Denies fever, chills, anorexia. Denies fatigue, weakness, weight loss.  CV: Denies chest pain, palpitations, syncope, peripheral edema, and claudication. Resp: Denies dyspnea at rest, cough, wheezing, coughing up blood, and pleurisy. GI: See HPI Derm: Denies rash, itching, dry skin Psych: Denies depression, anxiety, memory loss, confusion. No homicidal or suicidal ideation.  Heme: Denies bruising, bleeding, and enlarged lymph nodes.  Physical Exam   LMP 06/20/1997 (Approximate) Comment: post menopausal  General:   Alert and oriented. No distress noted. Pleasant and cooperative.  Head:  Normocephalic and atraumatic. Eyes:  Conjuctiva clear without scleral  icterus. Mouth:  Oral mucosa pink and moist. Good dentition. No lesions. Lungs:  Clear to auscultation bilaterally. No wheezes, rales, or rhonchi. No distress.  Heart:  S1, S2 present without murmurs appreciated.  Abdomen:  +BS, soft, non-tender and non-distended. No rebound or guarding. No HSM or masses noted. Rectal: *** Msk:  Symmetrical without gross deformities. Normal posture. Extremities:  Without edema. Neurologic:  Alert and  oriented x4 Psych:  Alert and cooperative. Normal mood and affect.  Assessment  Rondia Higginbotham is a 73 y.o. female with a history of depression, A-fib, COPD, HLD, HTN, PE 1973, GERD, laryngeal spasm, and chronic RUQ pain presenting today for follow up with complaint of ***    Constipation:   Nausea, weight loss:   RUQ pain;   History of colon polyps:   PLAN   ***     Julian Obey, MSN, FNP-BC, AGACNP-BC [facility]

## 2023-08-16 NOTE — Progress Notes (Signed)
 Cardiology Office Note  Date: 08/16/2023   ID: Laura Mcgee, DOB 1950/10/07, MRN [id number]  PCP:  Meldon Sport, MD  Cardiologist:  Lasalle Pointer, MD Electrophysiologist:  None    History of Present Illness: Laura Mcgee is a 73 y.o. female known to have paroxysmal A-fib, HTN, HLD, COPD, nicotine abuse and alcohol abuse is here for follow-up visit of Afib.  Patient was admitted to [facility] in 06/2021 with right humeral fracture and new onset of atrial fibrillation with RVR. She was placed on amiodarone  drip during hospitalization but not on any rate controlling agents upon discharge.  She was admitted to [facility] in 07/2023 with multifocal pneumonia and recurrence of atrial fibrillation with RVR spontaneously converted to NSR with rate controlling agents and with antibiotics.  She was discharged on midodrine , metoprolol  and diltiazem .  She reported that she felt awful after she started to take these medications.  She is also taking amlodipine  and olmesartan . She was seen by her PCP yesterday who recommended to hold of these medications until she sees cardiology. She has DOE since discharge from the hospital in May 2025.  Likely from multifocal pneumonia. Recovering. She also has productive cough.  She reported that she passed out before getting admitted to the hospital in May 2025.  No recurrence of syncope since then.  No dizziness, palpitations.  She did cut back on alcohol and smoking quite a bit.  Echocardiogram in 07/25/2023 showed normal LVEF and no significant valvular heart disease.  CVP was 15 mmHg.  She is requesting for referrals to neurology and ENT, instructed her to reach out to her PCP.  Past Medical History:  Diagnosis Date   Arthritis    Atrial fibrillation (HCC)    Cancer (HCC)    Vaginal   COPD (chronic obstructive pulmonary disease) (HCC)    Depression    Difficult intubation    History of laryngospasm   Dysrhythmia    GERD (gastroesophageal reflux  disease)    Hyperlipidemia    Hypertension    Numbness and tingling    Pulmonary embolism (HCC) 1973    Past Surgical History:  Procedure Laterality Date   CERVIX LESION DESTRUCTION     throat   COLONOSCOPY WITH PROPOFOL  N/A 04/07/2015   SLF: 1. seven colorectal polyps removed 2. the left colon is redundant 3. small internal hemorrhoids (1 simple adenoma, 6 hyperplastic) . Repeat in 5 years.   CYST EXCISION Left    Arm   ESOPHAGOGASTRODUODENOSCOPY (EGD) WITH PROPOFOL  N/A 04/07/2015   SLF: 1. Schatzki ring 2. Bravo capsule 34 cm from the teeth 3. moderate non-erosive gastritis.    HARDWARE REMOVAL Right 12/05/2022   Procedure: RIGHT HUMERUS HARDWARE REMOVAL;  Surgeon: Tonita Frater, MD;  Location: [facility];  Service: Orthopedics;  Laterality: Right;  NEEDS RNFA   HUMERUS IM NAIL Right 06/25/2021   Procedure: INTRAMEDULLARY (IM) NAIL HUMERAL;  Surgeon: Tonita Frater, MD;  Location: [facility];  Service: Orthopedics;  Laterality: Right;   MASS EXCISION N/A 2002   Lanyx   MASS EXCISION Left 01/21/2015   Procedure: EXCISION CYST LEFT UPPER ARM;  Surgeon: Alanda Allegra, MD;  Location: [facility];  Service: General;  Laterality: Left;   MICROLARYNGOSCOPY N/A 05/31/2016   Procedure: MICRO LARYNGOSCOPY WITH BIOPSY OF VOCAL CORD LESION;  Surgeon: Reynold Caves, MD;  Location: [facility];  Service: ENT;  Laterality: N/A;   POLYPECTOMY N/A 04/07/2015   Procedure: POLYPECTOMY;  Surgeon: Alyce Jubilee,  MD;  [facility];  Service: Endoscopy;  Laterality: N/A;  Descending colon polyps x 3    SAVORY DILATION N/A 04/07/2015   Procedure: SAVORY DILATION;  Surgeon: Alyce Jubilee, MD;  [facility];  Service: Endoscopy;  Laterality: N/A;   VULVECTOMY N/A 07/12/2022   Procedure: SIMPLE PARTIAL VULVECTOMY, ADVANCEMENT FLAP;  Surgeon: Derrel Flies, MD;  Location: [facility];  Service: Gynecology;  Laterality: N/A;    Current Outpatient Medications  Medication Sig Dispense Refill    amLODipine  (NORVASC ) 5 MG tablet Take 5 mg by mouth daily.     Apoaequorin (PREVAGEN PO) Take 1 tablet by mouth daily.     baclofen  (LIORESAL ) 10 MG tablet Take 1 tablet (10 mg total) by mouth daily as needed for muscle spasms (laryngospasm).     celecoxib  (CELEBREX ) 200 MG capsule Take 1 capsule (200 mg total) by mouth 2 (two) times daily. 60 capsule 3   dextromethorphan-guaiFENesin  (MUCINEX  DM) 30-600 MG 12hr tablet Take 1 tablet by mouth 2 (two) times daily as needed for cough. 10 tablet 0   folic acid  (FOLVITE ) 1 MG tablet Take 1 tablet (1 mg total) by mouth daily. 30 tablet 0   HYDROcodone -acetaminophen  (NORCO/VICODIN) 5-325 MG tablet Take 1 tablet by mouth every 12 (twelve) hours as needed for moderate pain (pain score 4-6) or severe pain (pain score 7-10). 60 tablet 0   Ipratropium-Albuterol  (COMBIVENT  RESPIMAT) 20-100 MCG/ACT AERS respimat Inhale 1 puff into the lungs every 6 (six) hours as needed. 4 g 1   lidocaine  (XYLOCAINE ) 2 % jelly Apply 1 Application topically as needed. 30 mL 3   lovastatin  (MEVACOR ) 20 MG tablet Take 1 tablet (20 mg total) by mouth in the morning. 90 tablet 1   olmesartan  (BENICAR ) 20 MG tablet Take 20 mg by mouth daily.     Propylene Glycol (SYSTANE COMPLETE OP) Place 1 drop into both eyes in the morning, at noon, and at bedtime.     thiamine  (VITAMIN B-1) 100 MG tablet Take 1 tablet (100 mg total) by mouth daily. 30 tablet 0   venlafaxine  XR (EFFEXOR -XR) 150 MG 24 hr capsule Take 2 capsules (300 mg total) by mouth every evening. (Patient taking differently: Take 150 mg by mouth daily.) 60 capsule 0   No current facility-administered medications for this visit.   Allergies:  Gabapentin  and Tetracyclines & related   Social History: The patient  reports that she has been smoking cigarettes. She has a 25 pack-year smoking history. She has been exposed to tobacco smoke. She has never used smokeless tobacco. She reports current alcohol use. She reports that she does  not currently use drugs after having used the following drugs: Marijuana.   Family History: The patient's family history includes Aneurysm in her father; Diabetes in her daughter; Esophageal cancer in her brother; Kidney cancer in her sister; Lung cancer in her sister; Melanoma in her maternal grandfather; Pneumonia in her mother.   ROS:  Please see the history of present illness. Otherwise, complete review of systems is positive for none.  All other systems are reviewed and negative.   Physical Exam: VS:  BP (!) 140/90   Pulse 77   Ht 5' 6 (1.676 m)   Wt 150 lb 3.2 oz (68.1 kg)   LMP 06/20/1997 (Approximate) Comment: post menopausal  SpO2 97%   BMI 24.24 kg/m , BMI Body mass index is 24.24 kg/m.  Wt Readings from Last 3 Encounters:  08/16/23 150 lb 3.2 oz (68.1  kg)  08/15/23 149 lb 3.2 oz (67.7 kg)  07/18/23 158 lb 8.2 oz (71.9 kg)    General: Patient appears comfortable at rest. HEENT: Conjunctiva and lids normal, oropharynx clear with moist mucosa. Neck: Supple, no elevated JVP or carotid bruits, no thyromegaly. Lungs: Clear to auscultation, nonlabored breathing at rest. Cardiac: Regular rate and rhythm, no S3 or significant systolic murmur, no pericardial rub. Abdomen: Soft, nontender, no hepatomegaly, bowel sounds present, no guarding or rebound. Extremities: No pitting edema, distal pulses 2+. Skin: Warm and dry. Musculoskeletal: No kyphosis. Neuropsychiatric: Alert and oriented x3, affect grossly appropriate.  ECG:  An ECG dated 06/14/2022 was personally reviewed today and demonstrated:  Normal sinus rhythm, no ST changes  Recent Labwork: 06/08/2023: TSH 0.555 07/16/2023: ALT 14; AST 16; B Natriuretic Peptide 261.0 07/18/2023: Magnesium  2.3 07/19/2023: Hemoglobin 12.6; Platelets 176 08/15/2023: BUN 12; Creatinine, Ser 0.70; Potassium 4.4; Sodium 137     Component Value Date/Time   CHOL 170 06/08/2023 1011   TRIG 63 06/08/2023 1011   HDL 82 06/08/2023 1011   CHOLHDL 2.1  06/08/2023 1011   LDLCALC 76 06/08/2023 1011    Assessment and Plan:  Paroxysmal A-fib: Onset in 2023 in the postop setting and May 2025 during hospital admission for multifocal pneumonia. Continues to drink alcohol but cut back significantly compared to last time, according to the patient.  Strongly recommended to quit alcohol.  EKG upon discharge from the hospital in May 2025 showed NSR.  She felt extremely fatigued after she started to take metoprolol /diltiazem  and midodrine  after discharge from the hospital.  Hold these medications.  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  Obtain 2-week event monitor for A-fib surveillance.  Syncope: 1 episode of syncope prior to hospitalization for multifocal pneumonia in May 2025.  Likely from dehydration.  Did not have any recurrences since discharge from the hospital.  Obtaining event monitor for A-fib, will also help evaluate syncope.  Less likely cardiac.  DOE likely from multifocal pneumonia: DOE onset was in May 2022 after she was diagnosed with multifocal pneumonia.  She still has productive cough as well.  Recovering.  If she continues to have DOE not improving in the next few weeks, okay to take p.o. Lasix 20 mg as needed.  HTN, controlled: Continue current antihypertensives, amlodipine  5 mg once daily, olmesartan  20 mg once daily, HTN management per PCP.  Nicotine abuse: Used to smoke 1 pack/day, cut down significantly now.  Congratulated.  Alcohol abuse: Used to drink a lot, cut back now significantly.  Congratulated.  Recommended to quit altogether.    Medication Adjustments/Labs and Tests Ordered: Current medicines are reviewed at length with the patient today.  Concerns regarding medicines are outlined above.   Tests Ordered: No orders of the defined types were placed in this encounter.   Medication Changes: No orders of the defined types were placed in this  encounter.   Disposition:  Follow up 1 year  Signed Steel Kerney Priya Kailo Kosik, MD, 08/16/2023 10:45 AM    [facility] [address] [place], [place], [place] [address]

## 2023-08-17 ENCOUNTER — Ambulatory Visit: Admitting: Gastroenterology

## 2023-08-18 ENCOUNTER — Ambulatory Visit: Payer: 59 | Admitting: Physician Assistant

## 2023-08-18 DIAGNOSIS — E876 Hypokalemia: Secondary | ICD-10-CM | POA: Diagnosis not present

## 2023-08-18 DIAGNOSIS — A419 Sepsis, unspecified organism: Secondary | ICD-10-CM | POA: Diagnosis not present

## 2023-08-18 DIAGNOSIS — Z79891 Long term (current) use of opiate analgesic: Secondary | ICD-10-CM | POA: Diagnosis not present

## 2023-08-18 DIAGNOSIS — M19011 Primary osteoarthritis, right shoulder: Secondary | ICD-10-CM | POA: Diagnosis not present

## 2023-08-18 DIAGNOSIS — Z556 Problems related to health literacy: Secondary | ICD-10-CM | POA: Diagnosis not present

## 2023-08-18 DIAGNOSIS — Z791 Long term (current) use of non-steroidal anti-inflammatories (NSAID): Secondary | ICD-10-CM | POA: Diagnosis not present

## 2023-08-18 DIAGNOSIS — E871 Hypo-osmolality and hyponatremia: Secondary | ICD-10-CM | POA: Diagnosis not present

## 2023-08-18 DIAGNOSIS — I480 Paroxysmal atrial fibrillation: Secondary | ICD-10-CM | POA: Diagnosis not present

## 2023-08-18 DIAGNOSIS — E782 Mixed hyperlipidemia: Secondary | ICD-10-CM | POA: Diagnosis not present

## 2023-08-18 DIAGNOSIS — Z86711 Personal history of pulmonary embolism: Secondary | ICD-10-CM | POA: Diagnosis not present

## 2023-08-18 DIAGNOSIS — Z9981 Dependence on supplemental oxygen: Secondary | ICD-10-CM | POA: Diagnosis not present

## 2023-08-18 DIAGNOSIS — M48061 Spinal stenosis, lumbar region without neurogenic claudication: Secondary | ICD-10-CM | POA: Diagnosis not present

## 2023-08-18 DIAGNOSIS — J449 Chronic obstructive pulmonary disease, unspecified: Secondary | ICD-10-CM | POA: Diagnosis not present

## 2023-08-18 DIAGNOSIS — I700 Atherosclerosis of aorta: Secondary | ICD-10-CM | POA: Diagnosis not present

## 2023-08-18 DIAGNOSIS — I119 Hypertensive heart disease without heart failure: Secondary | ICD-10-CM | POA: Diagnosis not present

## 2023-08-18 DIAGNOSIS — G629 Polyneuropathy, unspecified: Secondary | ICD-10-CM | POA: Diagnosis not present

## 2023-08-21 ENCOUNTER — Encounter: Payer: Self-pay | Admitting: Psychiatry

## 2023-08-21 ENCOUNTER — Telehealth: Payer: Self-pay | Admitting: Internal Medicine

## 2023-08-21 ENCOUNTER — Inpatient Hospital Stay: Attending: Psychiatry | Admitting: Psychiatry

## 2023-08-21 DIAGNOSIS — R9389 Abnormal findings on diagnostic imaging of other specified body structures: Secondary | ICD-10-CM

## 2023-08-21 DIAGNOSIS — Z7189 Other specified counseling: Secondary | ICD-10-CM | POA: Diagnosis not present

## 2023-08-21 DIAGNOSIS — M48062 Spinal stenosis, lumbar region with neurogenic claudication: Secondary | ICD-10-CM

## 2023-08-21 DIAGNOSIS — C519 Malignant neoplasm of vulva, unspecified: Secondary | ICD-10-CM | POA: Diagnosis not present

## 2023-08-21 DIAGNOSIS — G894 Chronic pain syndrome: Secondary | ICD-10-CM

## 2023-08-21 DIAGNOSIS — E782 Mixed hyperlipidemia: Secondary | ICD-10-CM

## 2023-08-21 MED ORDER — HYDROCODONE-ACETAMINOPHEN 5-325 MG PO TABS: 1.0000 | ORAL_TABLET | Freq: Two times a day (BID) | ORAL | 0 refills | Status: DC | PRN

## 2023-08-21 MED ORDER — LOVASTATIN 20 MG PO TABS: 20.0000 mg | ORAL_TABLET | Freq: Every morning | ORAL | 1 refills | Status: AC

## 2023-08-21 NOTE — Telephone Encounter (Signed)
 Copied from CRM [id number]. Topic: Clinical - Medication Refill >> Aug 21, 2023 12:08 PM Phil Braun wrote: Medication: HYDROcodone -acetaminophen  (NORCO/VICODIN) 5-325 MG tablet  lovastatin  (MEVACOR ) 20 MG tablet  Has the patient contacted their pharmacy? Yes  This is the patient's preferred pharmacy:  [facility] [place], [place] - [address] [place] [address] Phone: [phone] Fax: [phone]  Is this the correct pharmacy for this prescription? Yes If no, delete pharmacy and type the correct one.   Has the prescription been filled recently? Yes  Is the patient out of the medication? Yes  Has the patient been seen for an appointment in the last year OR does the patient have an upcoming appointment? Yes  Can we respond through MyChart? No  Agent: Please be advised that Rx refills may take up to 3 business days. We ask that you follow-up with your pharmacy.   ----------------------------------------------------------------------- From previous Reason for Contact - Lab/Test Results: Reason for CRM:

## 2023-08-21 NOTE — Progress Notes (Signed)
 Gynecologic Oncology Telehealth Follow-up Note  I connected with Laura Mcgee' Daughter, Laura Mcgee, on 08/21/23 at  4:30 PM EDT by telephone and verified that I am speaking with the correct person using two identifiers.  I discussed the limitations, risks, security and privacy concerns of performing an evaluation and management service by telemedicine and the availability of in-person appointments. I also discussed with the patient that there may be a patient responsible charge related to this service. The patient expressed understanding and agreed to proceed.  Other persons participating in the visit and their role in the encounter: Daughter, Laura Mcgee.  Patient's location: Home, [place] Provider's location: [place]  Date of Service: 08/21/2023 Referring Provider: Lendia Quay, NP [place], DO   Assessment & Plan: [name] is a 73 y.o. 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 CT in December to r/o biliary duct obstruction with incidental finding of endometrial thickening, recent pelvic ultrasound confirms with stripe of 10mm. Scant endometrial biopsy, follow-up for treatment plan  Endometrial thickening: - Endometrial biopsy 06/26/23 benign but scant - Recommend additional sampling  -Patient with recent hospitalization for sepsis from pneumonia.  Has had PCP and cardiology follow-up since discharge.  Has appeared to overall healed well from hospitalization.  May be acceptable to proceed with procedure at this time. - Will reach out to cardiology to confirm no concerns with proceeding with surgery - Patient no longer on anticoagulation at this time. - Patient was previously consented for: hysteroscopy, dilation and curettage on TBD.  Our office will call back patient's daughter tomorrow to offer available potential dates.  Vulvar cancer: - NED on prior exam - Given only 1mm DOI, negative margins, okay to proceed with close  surveillance.  - Signs/symptoms of recurrence reviewed. - Recommend surveillance q72mo for the first 2 years.  Chronic pain: - Has established with new PCP in [place].  For treatment to PCP.  RTC postop, then 30mo as previously scheduled.  Derrel Flies, MD Gynecologic Oncology   Medical Decision Making I personally spent  TOTAL 12 minutes via phone in the care of this patient.    ----------------------- Reason for Visit: Follow-up  Treatment History: Oncology History  Vulvar cancer (HCC)  07/12/2022 Surgery   Simple partial vulvectomy, advancement flap    07/12/2022 Initial Diagnosis   Vulvar cancer (HCC)   07/12/2022 Pathology Results   FINAL MICROSCOPIC DIAGNOSIS:   A. VULVA, POSTERIOR, PARTIAL VULVECTOMY:  - Superficially invasive squamous cell carcinoma arising in high grade  squamous intraepithelial lesion, HPV associated (see Comment)  - Margins not involved by high grade dysplasia or carcinoma   COMMENT:  - Morphologic evaluation reveals multifocal areas of superficial  invasion up to 0.1 cm depth of invasion. P53 shows wild type staining.  P16 is strong positive in the lesion. This case was reviewed with Dr. Talmadge Fail  who agrees with the diagnosis.    07/12/2022 Cancer Staging   Staging form: Vulva, AJCC V9 - Pathologic stage from 07/12/2022: FIGO Stage IA (pT1a, pNX, cM0) - Signed by Derrel Flies, MD on 08/10/2022 Histopathologic type: Squamous cell carcinoma, NOS Stage prefix: Initial diagnosis     Interval History: Patient was admitted to the hospital from 07/16/2023 to 07/19/2023.  She presented for left-sided chest and flank pain and was treated for severe sepsis secondary to multifocal pneumonia.  She has follow-up with internal medicine since discharge on 08/15/2023.  She has resumed olmesartan  and amlodipine  pain.  She was  also seen by cardiology on 08/16/2023 for her history of paroxysmal atrial fibrillation.  Cardiology was in agreement and holding  anticoagulation given alcohol use and multiple falls, risks outweigh benefits.  Cardiology was obtaining an event monitor in the setting of A-fib and episode of syncope which may have been in the setting of dehydration from sepsis.  Patient's daughter wanted to follow-up on plan for endometrial sampling considering her hospitalization as above.  Reports that her mom is no longer using oxygen at home.    Past Medical/Surgical History: Past Medical History:  Diagnosis Date   Arthritis    Atrial fibrillation (HCC)    Cancer (HCC)    Vaginal   COPD (chronic obstructive pulmonary disease) (HCC)    Depression    Difficult intubation    History of laryngospasm   Dysrhythmia    GERD (gastroesophageal reflux disease)    Hyperlipidemia    Hypertension    Numbness and tingling    Pulmonary embolism (HCC) 1973    Past Surgical History:  Procedure Laterality Date   CERVIX LESION DESTRUCTION     throat   COLONOSCOPY WITH PROPOFOL  N/A 04/07/2015   SLF: 1. seven colorectal polyps removed 2. the left colon is redundant 3. small internal hemorrhoids (1 simple adenoma, 6 hyperplastic) . Repeat in 5 years.   CYST EXCISION Left    Arm   ESOPHAGOGASTRODUODENOSCOPY (EGD) WITH PROPOFOL  N/A 04/07/2015   SLF: 1. Schatzki ring 2. Bravo capsule 34 cm from the teeth 3. moderate non-erosive gastritis.    HARDWARE REMOVAL Right 12/05/2022   Procedure: RIGHT HUMERUS HARDWARE REMOVAL;  Surgeon: Tonita Frater, MD;  Location: [facility];  Service: Orthopedics;  Laterality: Right;  NEEDS RNFA   HUMERUS IM NAIL Right 06/25/2021   Procedure: INTRAMEDULLARY (IM) NAIL HUMERAL;  Surgeon: Tonita Frater, MD;  Location: [facility];  Service: Orthopedics;  Laterality: Right;   MASS EXCISION N/A 2002   Lanyx   MASS EXCISION Left 01/21/2015   Procedure: EXCISION CYST LEFT UPPER ARM;  Surgeon: Alanda Allegra, MD;  Location: [facility];  Service: General;  Laterality: Left;   MICROLARYNGOSCOPY N/A 05/31/2016   Procedure: MICRO  LARYNGOSCOPY WITH BIOPSY OF VOCAL CORD LESION;  Surgeon: Reynold Caves, MD;  Location: [facility];  Service: ENT;  Laterality: N/A;   POLYPECTOMY N/A 04/07/2015   Procedure: POLYPECTOMY;  Surgeon: Alyce Jubilee, MD;  Location: [facility];  Service: Endoscopy;  Laterality: N/A;  Descending colon polyps x 3    SAVORY DILATION N/A 04/07/2015   Procedure: SAVORY DILATION;  Surgeon: Alyce Jubilee, MD;  Location: [facility];  Service: Endoscopy;  Laterality: N/A;   VULVECTOMY N/A 07/12/2022   Procedure: SIMPLE PARTIAL VULVECTOMY, ADVANCEMENT FLAP;  Surgeon: Derrel Flies, MD;  Location: [facility];  Service: Gynecology;  Laterality: N/A;    Family History  Problem Relation Age of Onset   Pneumonia Mother    Aneurysm Father    Kidney cancer Sister    Lung cancer Sister    Esophageal cancer Brother    Melanoma Maternal Grandfather    Diabetes Daughter    Colon cancer Neg Hx    Breast cancer Neg Hx    Ovarian cancer Neg Hx    Endometrial cancer Neg Hx    Pancreatic cancer Neg Hx    Prostate cancer Neg Hx     Social History   Socioeconomic History   Marital status: Single    Spouse name: Not on file  Number of children: 1   Years of education: some college   Highest education level: Not on file  Occupational History   Occupation: Retired  Tobacco Use   Smoking status: Every Day    Current packs/day: 1.00    Average packs/day: 1 pack/day for 25.0 years (25.0 ttl pk-yrs)    Types: Cigarettes    Passive exposure: Current   Smokeless tobacco: Never   Tobacco comments:    1 ppd as of 06/08/23  Vaping Use   Vaping status: Never Used  Substance and Sexual Activity   Alcohol use: Yes    Comment: Drinks two  20 oz beer per night.   Drug use: Not Currently    Types: Marijuana    Comment: daily marijuana use, not since 03/2022   Sexual activity: Not Currently    Birth control/protection: Post-menopausal  Other Topics Concern   Not on file  Social History Narrative    Lives at home alone.   Right-handed.   Two cups sweet tea daily.   Social Drivers of Corporate investment banker Strain: Low Risk  (11/30/2022)   [facility]   Overall Financial Resource Strain (CARDIA)    Difficulty of Paying Living Expenses: Not hard at all  Food Insecurity: No Food Insecurity (07/17/2023)   Hunger Vital Sign    Worried About Running Out of Food in the Last Year: Never true    Ran Out of Food in the Last Year: Never true  Transportation Needs: Unmet Transportation Needs (07/17/2023)   PRAPARE - Transportation    Lack of Transportation (Medical): Yes    Lack of Transportation (Non-Medical): Yes  Physical Activity: Sufficiently Active (11/30/2022)   [facility]   Exercise Vital Sign    On average, how many days per week do you engage in moderate to strenuous exercise (like a brisk walk)?: 5 days    On average, how many minutes do you engage in exercise at this level?: 30 min  Stress: No Stress Concern Present (11/30/2022)   Received from [facility] - Occupational Stress Questionnaire    Feeling of Stress : Not at all  Social Connections: Socially Isolated (07/17/2023)   Social Connection and Isolation Panel    Frequency of Communication with Friends and Family: Once a week    Frequency of Social Gatherings with Friends and Family: Once a week    Attends Religious Services: 1 to 4 times per year    Active Member of [employer] or Organizations: No    Attends Banker Meetings: Never    Marital Status: Divorced    Current Medications:  Current Outpatient Medications:    amLODipine  (NORVASC ) 5 MG tablet, Take 5 mg by mouth daily., Disp: , Rfl:    Apoaequorin (PREVAGEN PO), Take 1 tablet by mouth daily., Disp: , Rfl:    baclofen  (LIORESAL ) 10 MG tablet, Take 1 tablet (10 mg total) by mouth daily as needed for muscle spasms (laryngospasm)., Disp: , Rfl:    celecoxib  (CELEBREX )  200 MG capsule, Take 1 capsule (200 mg total) by mouth 2 (two) times daily., Disp: 60 capsule, Rfl: 3   dextromethorphan-guaiFENesin  (MUCINEX  DM) 30-600 MG 12hr tablet, Take 1 tablet by mouth 2 (two) times daily as needed for cough., Disp: 10 tablet, Rfl: 0   furosemide  (LASIX ) 20 MG tablet, Take 1 tablet (20 mg total) by mouth daily as needed., Disp: 30 tablet, Rfl: 5  HYDROcodone -acetaminophen  (NORCO/VICODIN) 5-325 MG tablet, Take 1 tablet by mouth every 12 (twelve) hours as needed for moderate pain (pain score 4-6) or severe pain (pain score 7-10)., Disp: 60 tablet, Rfl: 0   Ipratropium-Albuterol  (COMBIVENT  RESPIMAT) 20-100 MCG/ACT AERS respimat, Inhale 1 puff into the lungs every 6 (six) hours as needed., Disp: 4 g, Rfl: 1   lidocaine  (XYLOCAINE ) 2 % jelly, Apply 1 Application topically as needed., Disp: 30 mL, Rfl: 3   lovastatin  (MEVACOR ) 20 MG tablet, Take 1 tablet (20 mg total) by mouth in the morning., Disp: 90 tablet, Rfl: 1   olmesartan  (BENICAR ) 20 MG tablet, Take 20 mg by mouth daily., Disp: , Rfl:    Propylene Glycol (SYSTANE COMPLETE OP), Place 1 drop into both eyes in the morning, at noon, and at bedtime., Disp: , Rfl:    venlafaxine  XR (EFFEXOR -XR) 150 MG 24 hr capsule, Take 2 capsules (300 mg total) by mouth every evening. (Patient taking differently: Take 150 mg by mouth daily.), Disp: 60 capsule, Rfl: 0  Review of Symptoms: Complete 10-system review is positive for: none  Physical Exam: Deferred given limitations of phone visit.   Laboratory & Radiologic Studies: None

## 2023-08-22 ENCOUNTER — Telehealth: Payer: Self-pay

## 2023-08-22 DIAGNOSIS — A419 Sepsis, unspecified organism: Secondary | ICD-10-CM | POA: Diagnosis not present

## 2023-08-22 DIAGNOSIS — Z79891 Long term (current) use of opiate analgesic: Secondary | ICD-10-CM | POA: Diagnosis not present

## 2023-08-22 DIAGNOSIS — Z86711 Personal history of pulmonary embolism: Secondary | ICD-10-CM | POA: Diagnosis not present

## 2023-08-22 DIAGNOSIS — I700 Atherosclerosis of aorta: Secondary | ICD-10-CM | POA: Diagnosis not present

## 2023-08-22 DIAGNOSIS — J449 Chronic obstructive pulmonary disease, unspecified: Secondary | ICD-10-CM | POA: Diagnosis not present

## 2023-08-22 DIAGNOSIS — G629 Polyneuropathy, unspecified: Secondary | ICD-10-CM | POA: Diagnosis not present

## 2023-08-22 DIAGNOSIS — M19011 Primary osteoarthritis, right shoulder: Secondary | ICD-10-CM | POA: Diagnosis not present

## 2023-08-22 DIAGNOSIS — I480 Paroxysmal atrial fibrillation: Secondary | ICD-10-CM | POA: Diagnosis not present

## 2023-08-22 DIAGNOSIS — Z791 Long term (current) use of non-steroidal anti-inflammatories (NSAID): Secondary | ICD-10-CM | POA: Diagnosis not present

## 2023-08-22 DIAGNOSIS — E782 Mixed hyperlipidemia: Secondary | ICD-10-CM | POA: Diagnosis not present

## 2023-08-22 DIAGNOSIS — Z556 Problems related to health literacy: Secondary | ICD-10-CM | POA: Diagnosis not present

## 2023-08-22 DIAGNOSIS — M48061 Spinal stenosis, lumbar region without neurogenic claudication: Secondary | ICD-10-CM | POA: Diagnosis not present

## 2023-08-22 DIAGNOSIS — E876 Hypokalemia: Secondary | ICD-10-CM | POA: Diagnosis not present

## 2023-08-22 DIAGNOSIS — I119 Hypertensive heart disease without heart failure: Secondary | ICD-10-CM | POA: Diagnosis not present

## 2023-08-22 DIAGNOSIS — Z9981 Dependence on supplemental oxygen: Secondary | ICD-10-CM | POA: Diagnosis not present

## 2023-08-22 DIAGNOSIS — E871 Hypo-osmolality and hyponatremia: Secondary | ICD-10-CM | POA: Diagnosis not present

## 2023-08-22 NOTE — Telephone Encounter (Signed)
 Spoke with Laura Mcgee who returned call from office. Pt is aware of her surgery date of 7/29. Dr. Daisey Dryer wanted to let the patient know she will only do the procedure if she stays the night with her daughter for the first 24 hours per anesthesia guidelines. The patient will not be kept in the hospital overnight since not indicated for this minor procedure. Pt has to have someone with her the first 24 hours after the procedure. Pt verbalized understanding, agreed and thanked the office.

## 2023-08-22 NOTE — Telephone Encounter (Signed)
-----   Message from Suellyn Emory sent at 08/22/2023  8:40 AM EDT ----- Please reach out to the patient and daughter about rescheduling the D&C. Options for dates include July 8, July 15, July 29, Aug 5, Aug 12, Aug 19.   Dr. Daisey Dryer wanted to let the patient know she will only do the procedure if she stays the night with her daughter for the first 24 hours per anesthesia guidelines. She will not be kept in the hospital overnight since not indicated for this minor procedure. She has to have someone with her the first 24 hours after the procedure.

## 2023-08-22 NOTE — Telephone Encounter (Signed)
 Per Dr.Newton, I spoke to [employer] daughter, . She is aware of surgery option dates. She has chosen 7/29.   LVM for Ms.Theiler to call office regarding information from Dr.Newton.

## 2023-08-22 NOTE — Telephone Encounter (Signed)
 Copied from CRM [id number]. Topic: General - Other >> Aug 22, 2023  1:36 PM [name] wrote: Reason for CRM: Spoke with [name] - Physical Therapist with [facility] # [phone].   Wanted to make the provider aware :   States upon taking the patients vitals the patients diastolic level is measuring 92, normal range for the patient is about a 90. Pain level is 10/10 but patient stated she has not taken any of her medication today. Patient also stated no chest tightness/pain at this time but does have intermittent dizziness. Patient states whenever she has a dizzy spell she has a resting tremor in her hands & a bad headache that will follow.

## 2023-08-23 NOTE — Telephone Encounter (Signed)
 Tried calling pt to discuss unable to leave vm.

## 2023-08-23 NOTE — Telephone Encounter (Signed)
 Spoke to pt reports feeling better.

## 2023-08-28 ENCOUNTER — Encounter (HOSPITAL_COMMUNITY): Payer: Self-pay | Admitting: Internal Medicine

## 2023-08-31 DIAGNOSIS — Z556 Problems related to health literacy: Secondary | ICD-10-CM | POA: Diagnosis not present

## 2023-08-31 DIAGNOSIS — J449 Chronic obstructive pulmonary disease, unspecified: Secondary | ICD-10-CM | POA: Diagnosis not present

## 2023-08-31 DIAGNOSIS — G629 Polyneuropathy, unspecified: Secondary | ICD-10-CM | POA: Diagnosis not present

## 2023-08-31 DIAGNOSIS — E876 Hypokalemia: Secondary | ICD-10-CM | POA: Diagnosis not present

## 2023-08-31 DIAGNOSIS — I480 Paroxysmal atrial fibrillation: Secondary | ICD-10-CM | POA: Diagnosis not present

## 2023-08-31 DIAGNOSIS — M19011 Primary osteoarthritis, right shoulder: Secondary | ICD-10-CM | POA: Diagnosis not present

## 2023-08-31 DIAGNOSIS — A419 Sepsis, unspecified organism: Secondary | ICD-10-CM | POA: Diagnosis not present

## 2023-08-31 DIAGNOSIS — Z86711 Personal history of pulmonary embolism: Secondary | ICD-10-CM | POA: Diagnosis not present

## 2023-08-31 DIAGNOSIS — M48061 Spinal stenosis, lumbar region without neurogenic claudication: Secondary | ICD-10-CM | POA: Diagnosis not present

## 2023-08-31 DIAGNOSIS — E871 Hypo-osmolality and hyponatremia: Secondary | ICD-10-CM | POA: Diagnosis not present

## 2023-08-31 DIAGNOSIS — I700 Atherosclerosis of aorta: Secondary | ICD-10-CM | POA: Diagnosis not present

## 2023-08-31 DIAGNOSIS — E782 Mixed hyperlipidemia: Secondary | ICD-10-CM | POA: Diagnosis not present

## 2023-08-31 DIAGNOSIS — I119 Hypertensive heart disease without heart failure: Secondary | ICD-10-CM | POA: Diagnosis not present

## 2023-08-31 DIAGNOSIS — Z791 Long term (current) use of non-steroidal anti-inflammatories (NSAID): Secondary | ICD-10-CM | POA: Diagnosis not present

## 2023-08-31 DIAGNOSIS — Z79891 Long term (current) use of opiate analgesic: Secondary | ICD-10-CM | POA: Diagnosis not present

## 2023-08-31 DIAGNOSIS — Z9981 Dependence on supplemental oxygen: Secondary | ICD-10-CM | POA: Diagnosis not present

## 2023-09-07 ENCOUNTER — Ambulatory Visit: Payer: Self-pay | Admitting: Internal Medicine

## 2023-09-11 DIAGNOSIS — I480 Paroxysmal atrial fibrillation: Secondary | ICD-10-CM | POA: Diagnosis not present

## 2023-09-12 ENCOUNTER — Encounter: Payer: Self-pay | Admitting: Internal Medicine

## 2023-09-12 ENCOUNTER — Encounter (INDEPENDENT_AMBULATORY_CARE_PROVIDER_SITE_OTHER): Payer: Self-pay

## 2023-09-12 ENCOUNTER — Ambulatory Visit (INDEPENDENT_AMBULATORY_CARE_PROVIDER_SITE_OTHER): Admitting: Internal Medicine

## 2023-09-12 VITALS — BP 124/88 | HR 108 | Ht 66.0 in | Wt 146.4 lb

## 2023-09-12 DIAGNOSIS — G894 Chronic pain syndrome: Secondary | ICD-10-CM | POA: Diagnosis not present

## 2023-09-12 DIAGNOSIS — K219 Gastro-esophageal reflux disease without esophagitis: Secondary | ICD-10-CM | POA: Diagnosis not present

## 2023-09-12 DIAGNOSIS — M48062 Spinal stenosis, lumbar region with neurogenic claudication: Secondary | ICD-10-CM

## 2023-09-12 DIAGNOSIS — E782 Mixed hyperlipidemia: Secondary | ICD-10-CM

## 2023-09-12 DIAGNOSIS — J321 Chronic frontal sinusitis: Secondary | ICD-10-CM | POA: Insufficient documentation

## 2023-09-12 DIAGNOSIS — G6289 Other specified polyneuropathies: Secondary | ICD-10-CM | POA: Diagnosis not present

## 2023-09-12 DIAGNOSIS — I480 Paroxysmal atrial fibrillation: Secondary | ICD-10-CM

## 2023-09-12 DIAGNOSIS — E538 Deficiency of other specified B group vitamins: Secondary | ICD-10-CM

## 2023-09-12 DIAGNOSIS — R42 Dizziness and giddiness: Secondary | ICD-10-CM | POA: Diagnosis not present

## 2023-09-12 DIAGNOSIS — I10 Essential (primary) hypertension: Secondary | ICD-10-CM

## 2023-09-12 MED ORDER — CYANOCOBALAMIN 1000 MCG/ML IJ SOLN
1000.0000 ug | Freq: Once | INTRAMUSCULAR | Status: AC
  Administered 2023-09-12: 1000 ug via INTRAMUSCULAR

## 2023-09-12 MED ORDER — CELECOXIB 200 MG PO CAPS: 200.0000 mg | ORAL_CAPSULE | Freq: Two times a day (BID) | ORAL | 3 refills | Status: DC

## 2023-09-12 MED ORDER — OMEPRAZOLE 20 MG PO CPDR: 20.0000 mg | DELAYED_RELEASE_CAPSULE | Freq: Every day | ORAL | 3 refills | Status: AC

## 2023-09-12 MED ORDER — AMLODIPINE BESYLATE 5 MG PO TABS: 5.0000 mg | ORAL_TABLET | Freq: Every day | ORAL | 3 refills | Status: AC

## 2023-09-12 MED ORDER — OLMESARTAN MEDOXOMIL 20 MG PO TABS: 20.0000 mg | ORAL_TABLET | Freq: Every day | ORAL | 3 refills | Status: AC

## 2023-09-12 MED ORDER — FLUTICASONE PROPIONATE 50 MCG/ACT NA SUSP: 2.0000 | Freq: Every day | NASAL | 2 refills | Status: DC

## 2023-09-12 NOTE — Progress Notes (Signed)
 New Patient Office Visit  Subjective:  Patient ID: Laura Mcgee, female    DOB: April 21, 1950  Age: 73 y.o. MRN: [id number]  CC:  Chief Complaint  Patient presents with   Medical Management of Chronic Issues    5 month f/u , states she d/c lasix  1 week ago.    Fall    Reports having multiple falls has concerns about sinuses would like a referral to ENT.     HPI Laura Mcgee is a 73 y.o. female with past medical history of HTN, paroxysmal A-fib, GERD, dysphagia, vulvar cancer, chronic pain syndrome, HLD and tobacco abuse who presents for establishing care.  Postural dizziness: She reports episodes of syncope. She reports lightheadedness with position change.  Her orthostatic vitals were positive in the last visit.  She has a history of poor p.o. intake of fluids, has only 1 meal in a day and reports taking 2 beers every day. She was recently evaluated by Cardiology, and was given Lasix  20 mg as needed for exertional dyspnea.  She had worsening of her dizziness with it and led to a fall at home about 2 weeks ago.  HTN: Her BP was WNL today.  Orthostatic vitals were positive in the last visit.  She takes amlodipine  5 mg QD and olmesartan  20 mg QD currently.  Denies headache, chest pain or palpitations currently.  She has stopped taking Lasix  now.  Paroxysmal A-fib: She was diagnosed with A-fib in 2023. She was admitted to [facility] in 06/2021 with right humeral fracture and new onset of atrial fibrillation with RVR. She was placed on amiodarone  drip during hospitalization but currently she is not on any rate controlling agents. She was also not placed on systemic anticoagulation due to active alcohol abuse. She follows up with Cardiology.  Chronic pain syndrome: She has history of lumbar spinal stenosis, cervical DDD and chronic right shoulder pain.  She has started taking Norco 5-325 mg BID since the last visit.  She has noticed mild improvement in her back pain, but requests to  increase the dose of her Norco.  She used to take Norco 10/325 mg up to TID PRN for chronic pain when she saw Dr Knowlton.  I had lengthy discussion about hesitation to increase her dose of Norco due to her current postural dizziness and recurrent falls.  Chronic sinusitis: She reports chronic nasal congestion, sinus pressure related headache and postnasal drip.  Denies any fever or chills.  Denies dyspnea or wheezing currently.  She has been using Flonase  inconsistently.  She requests ENT referral.  Peripheral neuropathy: She has been evaluated by Dr. Onita at [facility] neurology for in the past.  She has chronic numbness and weakness of bilateral LE.  She has tried gabapentin  and Lyrica , but could not tolerate them.  She requests referral to neurology again.  She takes Effexor  150 mg QD for hot flashes.   Past Medical History:  Diagnosis Date   Arthritis    Atrial fibrillation (HCC)    Cancer (HCC)    Vaginal   COPD (chronic obstructive pulmonary disease) (HCC)    Depression    Difficult intubation    History of laryngospasm   Dysrhythmia    GERD (gastroesophageal reflux disease)    Hyperlipidemia    Hypertension    Numbness and tingling    Pulmonary embolism (HCC) 1973    Past Surgical History:  Procedure Laterality Date   CERVIX LESION DESTRUCTION     throat   COLONOSCOPY WITH  PROPOFOL  N/A 04/07/2015   SLF: 1. seven colorectal polyps removed 2. the left colon is redundant 3. small internal hemorrhoids (1 simple adenoma, 6 hyperplastic) . Repeat in 5 years.   CYST EXCISION Left    Arm   ESOPHAGOGASTRODUODENOSCOPY (EGD) WITH PROPOFOL  N/A 04/07/2015   SLF: 1. Schatzki ring 2. Bravo capsule 34 cm from the teeth 3. moderate non-erosive gastritis.    HARDWARE REMOVAL Right 12/05/2022   Procedure: RIGHT HUMERUS HARDWARE REMOVAL;  Surgeon: Onesimo Oneil LABOR, MD;  Location: [facility];  Service: Orthopedics;  Laterality: Right;  NEEDS RNFA   HUMERUS IM NAIL Right 06/25/2021   Procedure:  INTRAMEDULLARY (IM) NAIL HUMERAL;  Surgeon: Onesimo Oneil LABOR, MD;  Location: [facility];  Service: Orthopedics;  Laterality: Right;   MASS EXCISION N/A 2002   Lanyx   MASS EXCISION Left 01/21/2015   Procedure: EXCISION CYST LEFT UPPER ARM;  Surgeon: Oneil Budge, MD;  Location: [facility];  Service: General;  Laterality: Left;   MICROLARYNGOSCOPY N/A 05/31/2016   Procedure: MICRO LARYNGOSCOPY WITH BIOPSY OF VOCAL CORD LESION;  Surgeon: Daniel Moccasin, MD;  Location: [facility];  Service: ENT;  Laterality: N/A;   POLYPECTOMY N/A 04/07/2015   Procedure: POLYPECTOMY;  Surgeon: Margo LITTIE Haddock, MD;  Location: [facility];  Service: Endoscopy;  Laterality: N/A;  Descending colon polyps x 3    SAVORY DILATION N/A 04/07/2015   Procedure: SAVORY DILATION;  Surgeon: Margo LITTIE Haddock, MD;  Location: [facility];  Service: Endoscopy;  Laterality: N/A;   VULVECTOMY N/A 07/12/2022   Procedure: SIMPLE PARTIAL VULVECTOMY, ADVANCEMENT FLAP;  Surgeon: Eldonna Mays, MD;  Location: [facility];  Service: Gynecology;  Laterality: N/A;    Family History  Problem Relation Age of Onset   Pneumonia Mother    Aneurysm Father    Kidney cancer Sister    Lung cancer Sister    Esophageal cancer Brother    Melanoma Maternal Grandfather    Diabetes Daughter    Colon cancer Neg Hx    Breast cancer Neg Hx    Ovarian cancer Neg Hx    Endometrial cancer Neg Hx    Pancreatic cancer Neg Hx    Prostate cancer Neg Hx     Social History   Socioeconomic History   Marital status: Single    Spouse name: Not on file   Number of children: 1   Years of education: some college   Highest education level: Not on file  Occupational History   Occupation: Retired  Tobacco Use   Smoking status: Every Day    Current packs/day: 1.00    Average packs/day: 1 pack/day for 25.0 years (25.0 ttl pk-yrs)    Types: Cigarettes    Passive exposure: Current   Smokeless tobacco: Never   Tobacco comments:    1 ppd as of 06/08/23   Vaping Use   Vaping status: Never Used  Substance and Sexual Activity   Alcohol use: Yes    Comment: Drinks two  20 oz beer per night.   Drug use: Not Currently    Types: Marijuana    Comment: daily marijuana use, not since 03/2022   Sexual activity: Not Currently    Birth control/protection: Post-menopausal  Other Topics Concern   Not on file  Social History Narrative   Lives at home alone.   Right-handed.   Two cups sweet tea daily.   Social Drivers of Health   Financial Resource Strain: Low Risk  (11/30/2022)   Received  from [facility]   Overall Financial Resource Strain (CARDIA)    Difficulty of Paying Living Expenses: Not hard at all  Food Insecurity: No Food Insecurity (07/17/2023)   Hunger Vital Sign    Worried About Running Out of Food in the Last Year: Never true    Ran Out of Food in the Last Year: Never true  Transportation Needs: Unmet Transportation Needs (07/17/2023)   PRAPARE - Administrator, Civil Service (Medical): Yes    Lack of Transportation (Non-Medical): Yes  Physical Activity: Sufficiently Active (11/30/2022)   Received from [facility]   Exercise Vital Sign    On average, how many days per week do you engage in moderate to strenuous exercise (like a brisk walk)?: 5 days    On average, how many minutes do you engage in exercise at this level?: 30 min  Stress: No Stress Concern Present (11/30/2022)   Received from [facility] Occupational [facility] - Occupational Stress Questionnaire    Feeling of Stress : Not at all  Social Connections: Socially Isolated (07/17/2023)   Social Connection and Isolation Panel    Frequency of Communication with Friends and Family: Once a week    Frequency of Social Gatherings with Friends and Family: Once a week    Attends Religious Services: 1 to 4 times per year    Active Member of [employer] or Organizations: No    Attends Banker Meetings: Never    Marital Status:  Divorced  Catering manager Violence: Not At Risk (07/17/2023)   Humiliation, Afraid, Rape, and Kick questionnaire    Fear of Current or Ex-Partner: No    Emotionally Abused: No    Physically Abused: No    Sexually Abused: No    ROS Review of Systems  Constitutional:  Positive for fatigue. Negative for chills and fever.  HENT:  Positive for congestion, postnasal drip, sinus pain and sore throat.   Eyes:  Negative for pain and discharge.  Respiratory:  Positive for cough. Negative for shortness of breath.   Cardiovascular:  Negative for chest pain and palpitations.  Gastrointestinal:  Negative for abdominal pain, diarrhea, nausea and vomiting.  Endocrine: Negative for polydipsia and polyuria.  Genitourinary:  Negative for dysuria and hematuria.  Musculoskeletal:  Positive for arthralgias and back pain. Negative for neck pain and neck stiffness.  Skin:  Negative for rash.  Neurological:  Positive for dizziness. Negative for weakness.  Psychiatric/Behavioral:  Negative for agitation and behavioral problems.     Objective:   Today's Vitals: BP 124/88 (BP Location: Left Arm)   Pulse (!) 108   Ht 5' 6 (1.676 m)   Wt 146 lb 6.4 oz (66.4 kg)   LMP 06/20/1997 (Approximate) Comment: post menopausal  SpO2 94%   BMI 23.63 kg/m   Physical Exam Vitals reviewed.  Constitutional:      General: She is not in acute distress.    Appearance: She is not diaphoretic.  HENT:     Head: Normocephalic and atraumatic.     Nose: Congestion present.     Right Sinus: Frontal sinus tenderness present.     Left Sinus: Frontal sinus tenderness present.     Mouth/Throat:     Mouth: Mucous membranes are dry.  Eyes:     General: No scleral icterus.    Extraocular Movements: Extraocular movements intact.  Cardiovascular:     Rate and Rhythm: Normal rate and regular rhythm.  Heart sounds: Normal heart sounds. No murmur heard. Pulmonary:     Breath sounds: Normal breath sounds. No wheezing or  rales.  Musculoskeletal:     Right shoulder: No swelling. Decreased range of motion.     Cervical back: Neck supple. No tenderness.     Lumbar back: Tenderness present. Decreased range of motion.     Right lower leg: No edema.     Left lower leg: No edema.  Skin:    General: Skin is warm.     Findings: No rash.  Neurological:     General: No focal deficit present.     Mental Status: She is alert and oriented to person, place, and time.     Sensory: Sensory deficit (B/l LE) present.     Motor: Weakness (B/l LE - 4/5) present.  Psychiatric:        Mood and Affect: Mood normal.        Behavior: Behavior normal.     Assessment & Plan:   Problem List Items Addressed This Visit       Cardiovascular and Mediastinum   Essential hypertension - Primary (Chronic)   BP Readings from Last 1 Encounters:  09/12/23 124/88   Well-controlled with amlodipine  5 mg QD and olmesartan  20 mg QD Needs to improve hydration to avoid orthostatic hypotension from olmesartan  DC Lasix  due to episode of fall, likely due to hypotension - reports BP in 90s/60s while taking Lasix  Counseled for compliance with the medications Advised DASH diet and moderate exercise/walking as tolerated       Relevant Medications   amLODipine  (NORVASC ) 5 MG tablet   olmesartan  (BENICAR ) 20 MG tablet   Paroxysmal A-fib (HCC) (Chronic)   Followed by cardiology Not on rate controlling agent or AC currently AC was previously avoided due to use of alcohol - risk of falls      Relevant Medications   amLODipine  (NORVASC ) 5 MG tablet   olmesartan  (BENICAR ) 20 MG tablet     Respiratory   Chronic frontal sinusitis   Continue Flonase  Refer to ENT specialist      Relevant Medications   fluticasone  (FLONASE ) 50 MCG/ACT nasal spray   Other Relevant Orders   Ambulatory referral to ENT     Digestive   GERD (gastroesophageal reflux disease)   Her epigastric discomfort is likely due to GERD She needs to avoid taking BC  powder Added omeprazole  20 mg once daily F/u with GI      Relevant Medications   omeprazole  (PRILOSEC) 20 MG capsule     Nervous and Auditory   Peripheral neuropathy   Has chronic bilateral LE numbness and weakness, has had neurology evaluation in the past Did not tolerate gabapentin  or Lyrica  in the past Referred to [facility] neurology as per patient request      Relevant Orders   Ambulatory referral to Neurology     Other   Lumbar spinal stenosis   Previous MRI of lumbar spine reviewed Tylenol  as needed for mild to moderate pain Continue Norco at a lower dose only for severe pain      Relevant Medications   celecoxib  (CELEBREX ) 200 MG capsule   Mixed hyperlipidemia   Continue lovastatin  20 mg QD      Relevant Medications   amLODipine  (NORVASC ) 5 MG tablet   olmesartan  (BENICAR ) 20 MG tablet   Chronic pain   Likely due to chronic right shoulder pain, lumbar spinal stenosis and cervical DDD Continue Norco at a lower dose only for  severe pain - advised to avoid alcohol use while taking Norco      Relevant Medications   celecoxib  (CELEBREX ) 200 MG capsule   Dizziness   Likely multifactorial, due to orthostatic hypotension from dehydration, chronic sinusitis, alcohol abuse and peripheral neuropathy Needs to use Flonase  regularly Needs to avoid alcohol Advised to maintain at least 64 ounces of fluid intake in a day      Relevant Orders   Ambulatory referral to ENT   Other Visit Diagnoses       B12 deficiency       Relevant Medications   cyanocobalamin  (VITAMIN B12) injection 1,000 mcg (Completed)        Outpatient Encounter Medications as of 09/12/2023  Medication Sig   Apoaequorin (PREVAGEN PO) Take 1 tablet by mouth daily.   baclofen  (LIORESAL ) 10 MG tablet Take 1 tablet (10 mg total) by mouth daily as needed for muscle spasms (laryngospasm).   dextromethorphan-guaiFENesin  (MUCINEX  DM) 30-600 MG 12hr tablet Take 1 tablet by mouth 2 (two) times daily as  needed for cough.   fluticasone  (FLONASE ) 50 MCG/ACT nasal spray Place 2 sprays into both nostrils daily.   HYDROcodone -acetaminophen  (NORCO/VICODIN) 5-325 MG tablet Take 1 tablet by mouth every 12 (twelve) hours as needed for moderate pain (pain score 4-6) or severe pain (pain score 7-10).   Ipratropium-Albuterol  (COMBIVENT  RESPIMAT) 20-100 MCG/ACT AERS respimat Inhale 1 puff into the lungs every 6 (six) hours as needed.   lidocaine  (XYLOCAINE ) 2 % jelly Apply 1 Application topically as needed.   lovastatin  (MEVACOR ) 20 MG tablet Take 1 tablet (20 mg total) by mouth in the morning.   omeprazole  (PRILOSEC) 20 MG capsule Take 1 capsule (20 mg total) by mouth daily.   Propylene Glycol (SYSTANE COMPLETE OP) Place 1 drop into both eyes in the morning, at noon, and at bedtime.   venlafaxine  XR (EFFEXOR -XR) 150 MG 24 hr capsule Take 2 capsules (300 mg total) by mouth every evening. (Patient taking differently: Take 150 mg by mouth daily.)   [DISCONTINUED] amLODipine  (NORVASC ) 5 MG tablet Take 5 mg by mouth daily.   [DISCONTINUED] celecoxib  (CELEBREX ) 200 MG capsule Take 1 capsule (200 mg total) by mouth 2 (two) times daily.   [DISCONTINUED] olmesartan  (BENICAR ) 20 MG tablet Take 20 mg by mouth daily.   amLODipine  (NORVASC ) 5 MG tablet Take 1 tablet (5 mg total) by mouth daily.   celecoxib  (CELEBREX ) 200 MG capsule Take 1 capsule (200 mg total) by mouth 2 (two) times daily.   olmesartan  (BENICAR ) 20 MG tablet Take 1 tablet (20 mg total) by mouth daily.   [DISCONTINUED] furosemide  (LASIX ) 20 MG tablet Take 1 tablet (20 mg total) by mouth daily as needed. (Patient not taking: Reported on 09/12/2023)   [EXPIRED] cyanocobalamin  (VITAMIN B12) injection 1,000 mcg    No facility-administered encounter medications on file as of 09/12/2023.    Follow-up: Return in about 3 months (around 12/13/2023) for HTN and chronic back pain.   Suzzane MARLA Blanch, MD

## 2023-09-12 NOTE — Assessment & Plan Note (Signed)
 Likely due to chronic right shoulder pain, lumbar spinal stenosis and cervical DDD Continue Norco at a lower dose only for severe pain - advised to avoid alcohol use while taking Norco

## 2023-09-12 NOTE — Assessment & Plan Note (Signed)
 Continue lovastatin  20 mg QD

## 2023-09-12 NOTE — Assessment & Plan Note (Signed)
 Has chronic bilateral LE numbness and weakness, has had neurology evaluation in the past Did not tolerate gabapentin  or Lyrica  in the past Referred to [facility] neurology as per patient request

## 2023-09-12 NOTE — Assessment & Plan Note (Signed)
 Previous MRI of lumbar spine reviewed Tylenol  as needed for mild to moderate pain Continue Norco at a lower dose only for severe pain

## 2023-09-12 NOTE — Assessment & Plan Note (Signed)
 BP Readings from Last 1 Encounters:  09/12/23 124/88   Well-controlled with amlodipine  5 mg QD and olmesartan  20 mg QD Needs to improve hydration to avoid orthostatic hypotension from olmesartan  DC Lasix  due to episode of fall, likely due to hypotension - reports BP in 90s/60s while taking Lasix  Counseled for compliance with the medications Advised DASH diet and moderate exercise/walking as tolerated

## 2023-09-12 NOTE — Assessment & Plan Note (Signed)
 Likely multifactorial, due to orthostatic hypotension from dehydration, chronic sinusitis, alcohol abuse and peripheral neuropathy Needs to use Flonase  regularly Needs to avoid alcohol Advised to maintain at least 64 ounces of fluid intake in a day

## 2023-09-12 NOTE — Assessment & Plan Note (Signed)
 Followed by cardiology Not on rate controlling agent or AC currently AC was previously avoided due to use of alcohol - risk of falls

## 2023-09-12 NOTE — Assessment & Plan Note (Signed)
 Continue Flonase  Refer to ENT specialist

## 2023-09-12 NOTE — Patient Instructions (Addendum)
 Please schedule Mammography and DEXA scan.  Please start taking Amlodipine  and Olmesartan  as prescribed for blood pressure.  Please use Flonase  for nasal congestion.  Please start taking Omeprazole  for acid reflux/abdominal pain.  You are being referred to Neurology for peripheral neuropathy.  You are being referred to ENT specialist for dizziness and chronic sinusitis.

## 2023-09-12 NOTE — Assessment & Plan Note (Signed)
 Her epigastric discomfort is likely due to GERD She needs to avoid taking BC powder Added omeprazole  20 mg once daily F/u with GI

## 2023-09-15 DIAGNOSIS — I480 Paroxysmal atrial fibrillation: Secondary | ICD-10-CM | POA: Diagnosis not present

## 2023-09-19 NOTE — Progress Notes (Deleted)
 GI Office Note    Referring Provider: Tobie Suzzane POUR, MD Primary Care Physician:  Tobie Suzzane POUR, MD Primary Gastroenterologist: Carlin POUR. Cindie, DO  Date:  09/19/2023  ID:  Laura Mcgee, DOB 17-Aug-1950, MRN [id number]  Chief Complaint   No chief complaint on file.  History of Present Illness  Laura Mcgee is a 73 y.o. female with a history of depression, A-fib, COPD, HLD, HTN, PE 1973, alcohol abuse, vaginal cancer, GERD, laryngeal spasm, and chronic RUQ pain presenting today with complaint of ***   EGD and colonoscopy January 2017. She had a Schatzki ring, Bravo capsule placed, moderate nonerosive gastritis. Bravo study showed nonacid reflux on PPI. 7 colorectal polyps removed, one was a simple adenoma the others hyperplastic. Left colon redundant. Small internal hemorrhoids. Next colonoscopy 5 years (2022).    OV 05/03/22.  Has urge use bathroom in the mornings has a commode for a while but then unable to go.  States this initially hard to push out stools and then her stools will be large.  Continues to have the urgency.  Denies eating during the day, admits to eating after 6 PM as this is a chronic habit.  When eating at night she has pain and nausea.  Sometimes when she does eat she has nausea and pain usually occurs in the right upper quadrant and is intermittent.  Declined colonoscopy.  Denied any melena or BRBPR.  Reported 10 pound weight loss within the prior 6 weeks and decreased appetite.  Offered Cologuard as she refused colonoscopy.  Advise she may benefit from a an upper endoscopy.  Check LFTs and order right upper quadrant ultrasound.  Advised Colace twice daily and MiraLAX  nightly.   RUQ US  05/11/22: -Positive Murphy sign noted by sonographer -CBD measuring 4 mm -Fatty liver -Advised CT scan of the abdomen pelvis if she did not want to try pantoprazole  or omeprazole .   Last OV 01/16/23.  Complaints of lack of appetite.  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  Having pain in the right upper quadrant mostly and comes and goes and at times recently has felt more frequent.  No vomiting.  Only eating half meals that she prepares.  Drinks milk with every meal.  Does not like chicken or [place], primarily eating fish, sometimes pork or beef.  Has been feeling the need to strain but at times will have urgency and the pain goes away.  Taking senna docusate sodium  combo at times, try not to be dependent on it.  Has not tried any MiraLAX .  Did not feel as though pantoprazole  or omeprazole  were helpful. Advised to start famotidine 20 mg once daily. Miralax  nightly. GERD diet. Recommended protein shakes. CT A/P ordered. Advised will re discuss colonoscopy and EGD for evaluation of weight loss.    CT A/P 02/08/23 IMPRESSION: No evidence of biliary obstruction. Endometrial thickening or fluid within the endometrial canal, abnormal for postmenopausal state. Recommend pelvic ultrasound for further evaluation. 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 No suspicious renal mass, stones or hydronephrosis.  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 Visit 06/26/23 to discuss sampling of endometrial thickening.    Hospitalization 5/11-5/14 at [facility]. Refused SNF, given home PT/OT. Had some hypotension and was treated with midodrine . Discharged with home oxygen and need for echo without acute findings. Noted sepsis and pneumonia. Treated with IV abx. Some  BP meds were discontinued.    Today:    Wt Readings from Last 5 Encounters:  09/12/23 146 lb 6.4 oz (66.4 kg)  08/16/23 150 lb 3.2 oz (68.1 kg)  08/15/23 149 lb 3.2 oz (67.7 kg)  07/18/23 158 lb 8.2 oz (71.9 kg)  06/26/23 151 lb (68.5 kg)    Current Outpatient Medications  Medication Sig Dispense Refill   amLODipine  (NORVASC ) 5 MG tablet Take 1 tablet (5 mg total) by mouth daily. 90 tablet 3    Apoaequorin (PREVAGEN PO) Take 1 tablet by mouth daily.     baclofen  (LIORESAL ) 10 MG tablet Take 1 tablet (10 mg total) by mouth daily as needed for muscle spasms (laryngospasm).     celecoxib  (CELEBREX ) 200 MG capsule Take 1 capsule (200 mg total) by mouth 2 (two) times daily. 60 capsule 3   dextromethorphan-guaiFENesin  (MUCINEX  DM) 30-600 MG 12hr tablet Take 1 tablet by mouth 2 (two) times daily as needed for cough. 10 tablet 0   fluticasone  (FLONASE ) 50 MCG/ACT nasal spray Place 2 sprays into both nostrils daily. 16 g 2   HYDROcodone -acetaminophen  (NORCO/VICODIN) 5-325 MG tablet Take 1 tablet by mouth every 12 (twelve) hours as needed for moderate pain (pain score 4-6) or severe pain (pain score 7-10). 60 tablet 0   Ipratropium-Albuterol  (COMBIVENT  RESPIMAT) 20-100 MCG/ACT AERS respimat Inhale 1 puff into the lungs every 6 (six) hours as needed. 4 g 1   lidocaine  (XYLOCAINE ) 2 % jelly Apply 1 Application topically as needed. 30 mL 3   lovastatin  (MEVACOR ) 20 MG tablet Take 1 tablet (20 mg total) by mouth in the morning. 90 tablet 1   olmesartan  (BENICAR ) 20 MG tablet Take 1 tablet (20 mg total) by mouth daily. 90 tablet 3   omeprazole  (PRILOSEC) 20 MG capsule Take 1 capsule (20 mg total) by mouth daily. 30 capsule 3   Propylene Glycol (SYSTANE COMPLETE OP) Place 1 drop into both eyes in the morning, at noon, and at bedtime.     venlafaxine  XR (EFFEXOR -XR) 150 MG 24 hr capsule Take 2 capsules (300 mg total) by mouth every evening. (Patient taking differently: Take 150 mg by mouth daily.) 60 capsule 0   No current facility-administered medications for this visit.    Past Medical History:  Diagnosis Date   Arthritis    Atrial fibrillation (HCC)    Cancer (HCC)    Vaginal   COPD (chronic obstructive pulmonary disease) (HCC)    Depression    Difficult intubation    History of laryngospasm   Dysrhythmia    GERD (gastroesophageal reflux disease)    Hyperlipidemia    Hypertension     Numbness and tingling    Pulmonary embolism (HCC) 1973    Past Surgical History:  Procedure Laterality Date   CERVIX LESION DESTRUCTION     throat   COLONOSCOPY WITH PROPOFOL  N/A 04/07/2015   SLF: 1. seven colorectal polyps removed 2. the left colon is redundant 3. small internal hemorrhoids (1 simple adenoma, 6 hyperplastic) . Repeat in 5 years.   CYST EXCISION Left    Arm   ESOPHAGOGASTRODUODENOSCOPY (EGD) WITH PROPOFOL  N/A 04/07/2015   SLF: 1. Schatzki ring 2. Bravo capsule 34 cm from the teeth 3. moderate non-erosive gastritis.    HARDWARE REMOVAL Right 12/05/2022   Procedure: RIGHT HUMERUS HARDWARE REMOVAL;  Surgeon: Onesimo Oneil LABOR, MD;  Location: [facility];  Service: Orthopedics;  Laterality: Right;  NEEDS RNFA   HUMERUS IM NAIL Right 06/25/2021   Procedure: INTRAMEDULLARY (  IM) NAIL HUMERAL;  Surgeon: Onesimo Oneil LABOR, MD;  Location: [facility];  Service: Orthopedics;  Laterality: Right;   MASS EXCISION N/A 2002   Lanyx   MASS EXCISION Left 01/21/2015   Procedure: EXCISION CYST LEFT UPPER ARM;  Surgeon: Oneil Budge, MD;  Location: [facility];  Service: General;  Laterality: Left;   MICROLARYNGOSCOPY N/A 05/31/2016   Procedure: MICRO LARYNGOSCOPY WITH BIOPSY OF VOCAL CORD LESION;  Surgeon: Daniel Moccasin, MD;  Location:  SURGERY CENTER;  Service: ENT;  Laterality: N/A;   POLYPECTOMY N/A 04/07/2015   Procedure: POLYPECTOMY;  Surgeon: Margo LITTIE Haddock, MD;  Location: [facility];  Service: Endoscopy;  Laterality: N/A;  Descending colon polyps x 3    SAVORY DILATION N/A 04/07/2015   Procedure: SAVORY DILATION;  Surgeon: Margo LITTIE Haddock, MD;  Location: [facility];  Service: Endoscopy;  Laterality: N/A;   VULVECTOMY N/A 07/12/2022   Procedure: SIMPLE PARTIAL VULVECTOMY, ADVANCEMENT FLAP;  Surgeon: Eldonna Mays, MD;  Location: [facility];  Service: Gynecology;  Laterality: N/A;    Family History  Problem Relation Age of Onset   Pneumonia Mother    Aneurysm Father    Kidney cancer Sister     Lung cancer Sister    Esophageal cancer Brother    Melanoma Maternal Grandfather    Diabetes Daughter    Colon cancer Neg Hx    Breast cancer Neg Hx    Ovarian cancer Neg Hx    Endometrial cancer Neg Hx    Pancreatic cancer Neg Hx    Prostate cancer Neg Hx     Allergies as of 09/21/2023 - Review Complete 09/12/2023  Allergen Reaction Noted   Gabapentin  Other (See Comments) 05/22/2019   Tetracyclines & related Other (See Comments) 01/15/2015    Social History   Socioeconomic History   Marital status: Single    Spouse name: Not on file   Number of children: 1   Years of education: some college   Highest education level: Not on file  Occupational History   Occupation: Retired  Tobacco Use   Smoking status: Every Day    Current packs/day: 1.00    Average packs/day: 1 pack/day for 25.0 years (25.0 ttl pk-yrs)    Types: Cigarettes    Passive exposure: Current   Smokeless tobacco: Never   Tobacco comments:    1 ppd as of 06/08/23  Vaping Use   Vaping status: Never Used  Substance and Sexual Activity   Alcohol use: Yes    Comment: Drinks two  20 oz beer per night.   Drug use: Not Currently    Types: Marijuana    Comment: daily marijuana use, not since 03/2022   Sexual activity: Not Currently    Birth control/protection: Post-menopausal  Other Topics Concern   Not on file  Social History Narrative   Lives at home alone.   Right-handed.   Two cups sweet tea daily.   Social Drivers of Corporate investment banker Strain: Low Risk  (11/30/2022)   Received from [facility]   Overall Financial Resource Strain (CARDIA)    Difficulty of Paying Living Expenses: Not hard at all  Food Insecurity: No Food Insecurity (07/17/2023)   Hunger Vital Sign    Worried About Running Out of Food in the Last Year: Never true    Ran Out of Food in the Last Year: Never true  Transportation Needs: Unmet Transportation Needs (07/17/2023)   PRAPARE - Transportation    Lack of  Transportation (Medical): Yes    Lack of Transportation (Non-Medical): Yes  Physical Activity: Sufficiently Active (11/30/2022)   Received from [facility]   Exercise Vital Sign    On average, how many days per week do you engage in moderate to strenuous exercise (like a brisk walk)?: 5 days    On average, how many minutes do you engage in exercise at this level?: 30 min  Stress: No Stress Concern Present (11/30/2022)   Received from [facility] - Occupational Stress Questionnaire    Feeling of Stress : Not at all  Social Connections: Socially Isolated (07/17/2023)   Social Connection and Isolation Panel    Frequency of Communication with Friends and Family: Once a week    Frequency of Social Gatherings with Friends and Family: Once a week    Attends Religious Services: 1 to 4 times per year    Active Member of [employer] or Organizations: No    Attends Banker Meetings: Never    Marital Status: Divorced     Review of Systems   Gen: Denies fever, chills, anorexia. Denies fatigue, weakness, weight loss.  CV: Denies chest pain, palpitations, syncope, peripheral edema, and claudication. Resp: Denies dyspnea at rest, cough, wheezing, coughing up blood, and pleurisy. GI: See HPI Derm: Denies rash, itching, dry skin Psych: Denies depression, anxiety, memory loss, confusion. No homicidal or suicidal ideation.  Heme: Denies bruising, bleeding, and enlarged lymph nodes.  Physical Exam   LMP 06/20/1997 (Approximate) Comment: post menopausal  General:   Alert and oriented. No distress noted. Pleasant and cooperative.  Head:  Normocephalic and atraumatic. Eyes:  Conjuctiva clear without scleral icterus. Mouth:  Oral mucosa pink and moist. Good dentition. No lesions. Lungs:  Clear to auscultation bilaterally. No wheezes, rales, or rhonchi. No distress.  Heart:  S1, S2 present without murmurs appreciated.  Abdomen:  +BS, soft,  non-tender and non-distended. No rebound or guarding. No HSM or masses noted. Rectal: *** Msk:  Symmetrical without gross deformities. Normal posture. Extremities:  Without edema. Neurologic:  Alert and  oriented x4 Psych:  Alert and cooperative. Normal mood and affect.  Assessment  Laura Mcgee is a 73 y.o. female presenting today with ***  Constipation:   Nausea, weight loss:   RUQ pain;   History of colon polyps:   PLAN   ***   Follow up ***    Charmaine Melia, MSN, FNP-BC, AGACNP-BC [facility]

## 2023-09-20 ENCOUNTER — Ambulatory Visit: Payer: Self-pay | Admitting: Internal Medicine

## 2023-09-20 ENCOUNTER — Other Ambulatory Visit: Payer: Self-pay | Admitting: Internal Medicine

## 2023-09-20 DIAGNOSIS — M48062 Spinal stenosis, lumbar region with neurogenic claudication: Secondary | ICD-10-CM

## 2023-09-20 DIAGNOSIS — G894 Chronic pain syndrome: Secondary | ICD-10-CM

## 2023-09-20 MED ORDER — HYDROCODONE-ACETAMINOPHEN 5-325 MG PO TABS: 1.0000 | ORAL_TABLET | Freq: Two times a day (BID) | ORAL | 0 refills | Status: DC | PRN

## 2023-09-20 NOTE — Telephone Encounter (Signed)
 Copied from CRM [id number]. Topic: Clinical - Medication Refill >> Sep 20, 2023 10:23 AM Powell HERO wrote: Medication: HYDROcodone -acetaminophen  (NORCO/VICODIN) 5-325 MG tablet  Has the patient contacted their pharmacy? Yes   This is the patient's preferred pharmacy:  [facility] - [place], [place] - [address] [place] [address] Phone: [phone] Fax: [phone]  Is this the correct pharmacy for this prescription? Yes If no, delete pharmacy and type the correct one.   Has the prescription been filled recently? No  Is the patient out of the medication? Yes  Has the patient been seen for an appointment in the last year OR does the patient have an upcoming appointment? Yes  Can we respond through MyChart? Yes  Agent: Please be advised that Rx refills may take up to 3 business days. We ask that you follow-up with your pharmacy.

## 2023-09-21 ENCOUNTER — Ambulatory Visit: Admitting: Gastroenterology

## 2023-09-22 NOTE — Telephone Encounter (Signed)
-----   Message from Vishnu P Mallipeddi sent at 09/20/2023 10:17 AM EDT ----- 7% A-fib burden, 1457 runs of nonsustained SVT and 2.2% PAC burden.  Symptoms correlated with A-fib, NSR, PAC and PVC.  Strongly recommend alcohol cessation.  She did not tolerate metoprolol  after  hospital discharge but she can try metoprolol  tartrate 25 mg twice daily again and see if she tolerates.  This medication is being started due to 7% A-fib burden.  Schedule follow-up in 6 months. ----- Message ----- From: Mallipeddi, Vishnu P, MD Sent: 09/15/2023  12:38 PM EDT To: Vishnu P Mallipeddi, MD

## 2023-09-22 NOTE — Telephone Encounter (Signed)
 The patient has been notified of the result and verbalized understanding.  All questions (if any) were answered. Littie CHRISTELLA Croak, CMA 09/22/2023 3:23 PM

## 2023-09-25 ENCOUNTER — Encounter: Payer: Self-pay | Admitting: Gastroenterology

## 2023-09-27 NOTE — Progress Notes (Signed)
 Anesthesia Review:  PCP: Cardiologist :  PPM/ ICD: Device Orders: Rep Notified:  Chest x-ray : EKG : Echo : Stress test: Cardiac Cath :   Activity level:  Sleep Study/ CPAP : Fasting Blood Sugar :      / Checks Blood Sugar -- times a day:    Blood Thinner/ Instructions /Last Dose: ASA / Instructions/ Last Dose :

## 2023-09-28 NOTE — Patient Instructions (Signed)
 SURGICAL WAITING ROOM VISITATION  Patients having surgery or a procedure may have no more than 2 support people in the waiting area - these visitors may rotate.    Children under the age of 51 must have an adult with them who is not the patient.  Visitors with respiratory illnesses are discouraged from visiting and should remain at home.  If the patient needs to stay at the hospital during part of their recovery, the visitor guidelines for inpatient rooms apply. Pre-op nurse will coordinate an appropriate time for 1 support person to accompany patient in pre-op.  This support person may not rotate.    Please refer to the [facility] website for the visitor guidelines for Inpatients (after your surgery is over and you are in a regular room).       Your procedure is scheduled on:  10/03/23    Report to [facility] Main Entrance    Report to admitting at  0515 AM   Call this number if you have problems the morning of surgery [phone]   Do not eat food  :After Midnight.   After Midnight you may have the following liquids until  0430______ AM  DAY OF SURGERY  Water  Non-Citrus Juices (without pulp, NO RED-Apple, White grape, White cranberry) Black Coffee (NO MILK/CREAM OR CREAMERS, sugar ok)  Clear Tea (NO MILK/CREAM OR CREAMERS, sugar ok) regular and decaf                             Plain Jell-O (NO RED)                                           Fruit ices (not with fruit pulp, NO RED)                                     Popsicles (NO RED)                                                               Sports drinks like Gatorade (NO RED)                           If you have questions, please contact your surgeon's office.      Oral Hygiene is also important to reduce your risk of infection.                                    Remember - BRUSH YOUR TEETH THE MORNING OF SURGERY WITH YOUR REGULAR TOOTHPASTE  DENTURES WILL BE REMOVED PRIOR TO SURGERY PLEASE DO NOT  APPLY Poly grip OR ADHESIVES!!!   Do NOT smoke after Midnight   Stop all vitamins and herbal supplements 7 days before surgery.   Take these medicines the morning of surgery with A SIP OF WATER :  amlodipine , flonase , inhalers as usual and bring, omeprazole , effexor    DO NOT TAKE ANY ORAL DIABETIC MEDICATIONS DAY OF YOUR  SURGERY  Bring CPAP mask and tubing day of surgery.                              You may not have any metal on your body including hair pins, jewelry, and body piercing             Do not wear make-up, lotions, powders, perfumes/cologne, or deodorant  Do not wear nail polish including gel and S&S, artificial/acrylic nails, or any other type of covering on natural nails including finger and toenails. If you have artificial nails, gel coating, etc. that needs to be removed by a nail salon please have this removed prior to surgery or surgery may need to be canceled/ delayed if the surgeon/ anesthesia feels like they are unable to be safely monitored.   Do not shave  48 hours prior to surgery.               Men may shave face and neck.   Do not bring valuables to the hospital. [facility] IS NOT             RESPONSIBLE   FOR VALUABLES.   Contacts, glasses, dentures or bridgework may not be worn into surgery.   Bring small overnight bag day of surgery.   DO NOT BRING YOUR HOME MEDICATIONS TO THE HOSPITAL. PHARMACY WILL DISPENSE MEDICATIONS LISTED ON YOUR MEDICATION LIST TO YOU DURING YOUR ADMISSION IN THE HOSPITAL!    Patients discharged on the day of surgery will not be allowed to drive home.  Someone NEEDS to stay with you for the first 24 hours after anesthesia.   Special Instructions: Bring a copy of your healthcare power of attorney and living will documents the day of surgery if you haven't scanned them before.              Please read over the following fact sheets you were given: IF YOU HAVE QUESTIONS ABOUT YOUR PRE-OP INSTRUCTIONS PLEASE CALL  [phone].   If you received a COVID test during your pre-op visit  it is requested that you wear a mask when out in public, stay away from anyone that may not be feeling well and notify your surgeon if you develop symptoms. If you test positive for Covid or have been in contact with anyone that has tested positive in the last 10 days please notify you surgeon.    [place] - Preparing for Surgery Before surgery, you can play an important role.  Because skin is not sterile, your skin needs to be as free of germs as possible.  You can reduce the number of germs on your skin by washing with CHG (chlorahexidine gluconate) soap before surgery.  CHG is an antiseptic cleaner which kills germs and bonds with the skin to continue killing germs even after washing. Please DO NOT use if you have an allergy to CHG or antibacterial soaps.  If your skin becomes reddened/irritated stop using the CHG and inform your nurse when you arrive at Short Stay. Do not shave (including legs and underarms) for at least 48 hours prior to the first CHG shower.  You may shave your face/neck. Please follow these instructions carefully:  1.  Shower with CHG Soap the night before surgery and the  morning of Surgery.  2.  If you choose to wash your hair, wash your hair first as usual with your  normal  shampoo.  3.  After  you shampoo, rinse your hair and body thoroughly to remove the  shampoo.                           4.  Use CHG as you would any other liquid soap.  You can apply chg directly  to the skin and wash                       Gently with a scrungie or clean washcloth.  5.  Apply the CHG Soap to your body ONLY FROM THE NECK DOWN.   Do not use on face/ open                           Wound or open sores. Avoid contact with eyes, ears mouth and genitals (private parts).                       Wash face,  Genitals (private parts) with your normal soap.             6.  Wash thoroughly, paying special attention to the area where  your surgery  will be performed.  7.  Thoroughly rinse your body with warm water  from the neck down.  8.  DO NOT shower/wash with your normal soap after using and rinsing off  the CHG Soap.                9.  Pat yourself dry with a clean towel.            10.  Wear clean pajamas.            11.  Place clean sheets on your bed the night of your first shower and do not  sleep with pets. Day of Surgery : Do not apply any lotions/deodorants the morning of surgery.  Please wear clean clothes to the hospital/surgery center.  FAILURE TO FOLLOW THESE INSTRUCTIONS MAY RESULT IN THE CANCELLATION OF YOUR SURGERY PATIENT SIGNATURE_________________________________  NURSE SIGNATURE__________________________________  ________________________________________________________________________

## 2023-09-29 ENCOUNTER — Other Ambulatory Visit: Payer: Self-pay

## 2023-09-29 ENCOUNTER — Encounter (HOSPITAL_COMMUNITY)
Admission: RE | Admit: 2023-09-29 | Discharge: 2023-09-29 | Disposition: A | Discharge status: Home / Self Care | Source: Ambulatory Visit | Attending: Psychiatry | Admitting: Psychiatry

## 2023-09-29 ENCOUNTER — Encounter (HOSPITAL_COMMUNITY): Payer: Self-pay

## 2023-09-29 ENCOUNTER — Telehealth: Payer: Self-pay | Admitting: *Deleted

## 2023-09-29 VITALS — BP 146/97 | HR 93 | Temp 98.0°F | Resp 16 | Ht 66.0 in | Wt 140.0 lb

## 2023-09-29 DIAGNOSIS — Z01818 Encounter for other preprocedural examination: Secondary | ICD-10-CM | POA: Insufficient documentation

## 2023-09-29 DIAGNOSIS — R9389 Abnormal findings on diagnostic imaging of other specified body structures: Secondary | ICD-10-CM | POA: Insufficient documentation

## 2023-09-29 HISTORY — DX: Pneumonia, unspecified organism: J18.9

## 2023-09-29 HISTORY — DX: Dyspnea, unspecified: R06.00

## 2023-09-29 LAB — CBC
HCT: 47.9 % — ABNORMAL HIGH (ref 36.0–46.0)
Hemoglobin: 15.8 g/dL — ABNORMAL HIGH (ref 12.0–15.0)
MCH: 31.6 pg (ref 26.0–34.0)
MCHC: 33 g/dL (ref 30.0–36.0)
MCV: 95.8 fL (ref 80.0–100.0)
Platelets: 267 K/uL (ref 150–400)
RBC: 5 MIL/uL (ref 3.87–5.11)
RDW: 15.1 % (ref 11.5–15.5)
WBC: 8.3 K/uL (ref 4.0–10.5)
nRBC: 0 % (ref 0.0–0.2)

## 2023-09-29 LAB — BASIC METABOLIC PANEL WITH GFR
Anion gap: 10 (ref 5–15)
BUN: 11 mg/dL (ref 8–23)
CO2: 23 mmol/L (ref 22–32)
Calcium: 9.5 mg/dL (ref 8.9–10.3)
Chloride: 103 mmol/L (ref 98–111)
Creatinine, Ser: 0.66 mg/dL (ref 0.44–1.00)
GFR, Estimated: >60 mL/min (ref >60)
Glucose, Bld: 88 mg/dL (ref 70–99)
Potassium: 3.7 mmol/L (ref 3.5–5.1)
Sodium: 136 mmol/L (ref 135–145)

## 2023-09-29 NOTE — Telephone Encounter (Signed)
 Left a voicemail message for [name] (daughter) regarding change of time for upcoming surgery for Laura Mcgee and asked her to call the office

## 2023-09-29 NOTE — Telephone Encounter (Signed)
 Pt returned call to office and confirmed that she was aware that her scheduled surgery time was at 11;30 with a 9:30 arrival time

## 2023-10-02 ENCOUNTER — Telehealth: Payer: Self-pay

## 2023-10-02 NOTE — Telephone Encounter (Signed)
 Spoke to Laura Mcgee, she is aware of pre-op instructions. Arrival time of 9:30, CL until 8:30. She voiced an understanding.

## 2023-10-02 NOTE — Progress Notes (Signed)
 Case: [id number] Date/Time: 10/03/23 1115   Procedures:      DILATATION & CURETTAGE/HYSTEROSCOPY WITH RESECTOCOPE     MYOSURE RESECTION   Anesthesia type: General   Pre-op diagnosis: THICKENED ENDOMETRIUM   Location: [facility]OM 03 / [facility]   Surgeons: Eldonna Mays, MD       DISCUSSION: Laura Mcgee is a 73 year old female who presents to PAT prior to surgery above.  Past medical history significant for current smoking, A.fib (not anticoagulated due to ETOH use), hx of PE, COPD, peripheral neuropathy, GERD, ETOH abuse, chronic pain syndrome with narcotic dependence, vaginal cancer s/p vulvectomy (2024).  Prior anesthesia complication includes laryngospasms thought to be due to GERD. No difficulty with intubation.  Admitted from 5/11-5/14 for multifocal PNA with respiratory failure and A fib with RVR. Treated with IV abx and O2. Antihypertensives were adjusted due to hypotension. She was discharged on home O2 but has since been weaned off. Echocardiogram in 07/25/2023 showed normal LVEF and no significant valvular heart disease.  CVP was 15 mmHg.  Patient follows with cardiology for history of A-fib.  Last seen on 08/16/2023 by Dr. Mallipeddi. She was advised to hold midodrine , metoprolol  succinate, and diltiazem  which were prescribed after her hospitalization. She was again advised to not start anticoagulation as well due to ETOH use and falls. Event monitor ordered which showed 7% A.fib burden, SVT. Pt was advised to start Metoprolol  tartrate to see if she can tolerate (not on current med list). Advised f/u in 6 months.   Patient seen by new PCP on 09/12/23. She reports episodes of dizziness with syncope. Orthostatic were positive in clinic. Pt endorsed poor PO intake and ongoing ETOH use. Advised to increase hydration and stop Lasix .   VS: BP (!) 146/97   Pulse 93 Comment: correction pulse was not 67 it was 93.  Temp 36.7 C (Oral)   Resp 16   Ht 5' 6 (1.676 m)   Wt 63.5 kg   LMP  06/20/1997 (Approximate) Comment: post menopausal  SpO2 97%   BMI 22.60 kg/m   PROVIDERS: Tobie Suzzane POUR, MD   LABS: Labs reviewed: Acceptable for surgery. (all labs ordered are listed, but only abnormal results are displayed)  Labs Reviewed  CBC - Abnormal; Notable for the following components:      Result Value   Hemoglobin 15.8 (*)    HCT 47.9 (*)    All other components within normal limits  BASIC METABOLIC PANEL WITH GFR     IMAGES:   CTA Chest 07/16/23:   IMPRESSION: 1. No evidence of pulmonary embolism. 2. Mild multifocal left upper lobe and moderate to marked severity posteromedial left lower lobe airspace disease, consistent with multifocal pneumonia. 3. Mild posterior right lower lobe atelectasis and/or infiltrate. 4. Bilateral low-attenuation adrenal masses, consistent with adrenal adenomas. 5. Bilateral nonspecific moderate to marked severity perinephric inflammatory fat stranding. Correlation with urinalysis is recommended as sequelae associated with acute pyelonephritis cannot be excluded. 6. Aortic atherosclerosis.     EKG 07/16/23 (patient hospitalized for multifocal PNA):  Sinus rhythm Probable left atrial enlargement    CV:  Event monitor 09/11/23:    Patch wear time was for 12 days and 6 hours.   Normal sinus rhythm predominantly ranging from 59 to 167 bpm with an average HR 84 bpm.   7% A-fib burden, ranging from 75 to 198 bpm with an average HR 138 bpm, longest duration lasting 3 hours 11 minutes with an average HR 142 bpm.   1,457  runs of nonsustained SVT occurred, fastest interval lasting 5 beats and the longest interval lasting 16 beats.   No evidence of ventricular arrhythmias, high-grade AV block or pauses.   2.2% PAC burden and <1% PVC burden.   Patient triggered events correlated with A-fib (110 to 185 bpm), NSR (81 to 125 bpm), PAC and PVC.  Echo 07/18/23:  IMPRESSIONS     1. Left ventricular ejection fraction, by estimation, is  50%. The left  ventricle has low normal function. The left ventricle has no regional wall  motion abnormalities. Left ventricular diastolic parameters are consistent  with Grade I diastolic  dysfunction (impaired relaxation).   2. Right ventricular systolic function is low normal. The right  ventricular size is mildly enlarged. Tricuspid regurgitation signal is  inadequate for assessing PA pressure.   3. The mitral valve is abnormal. Mild mitral valve regurgitation. No  evidence of mitral stenosis.   4. The aortic valve is tricuspid. Aortic valve regurgitation is not  visualized. No aortic stenosis is present.   5. The inferior vena cava is dilated in size with <50% respiratory  variability, suggesting right atrial pressure of 15 mmHg.   Stress test 06/29/22:    Stress ECG is negative for ischemia and arrythmias.   LV perfusion is abnormal. There is evidence of ischemia. There is no evidence of infarction. There is a small reversible perfusion defect with mild reduction in uptake present in the apex consistent with ischemia.   Left ventricular function is normal. Nuclear stress EF: 56 %.   Findings are consistent with ischemia. The study is low risk.    Past Medical History:  Diagnosis Date   Arthritis    Atrial fibrillation (HCC)    Cancer (HCC)    Vaginal   COPD (chronic obstructive pulmonary disease) (HCC)    Depression    Difficult intubation    History of laryngospasm   Dyspnea    Dysrhythmia    Hyperlipidemia    Hypertension    Numbness and tingling    Pneumonia    Pulmonary embolism (HCC) 1973    Past Surgical History:  Procedure Laterality Date   CERVIX LESION DESTRUCTION     throat   COLONOSCOPY WITH PROPOFOL  N/A 04/07/2015   SLF: 1. seven colorectal polyps removed 2. the left colon is redundant 3. small internal hemorrhoids (1 simple adenoma, 6 hyperplastic) . Repeat in 5 years.   CYST EXCISION Left    Arm   ESOPHAGOGASTRODUODENOSCOPY (EGD) WITH PROPOFOL  N/A  04/07/2015   SLF: 1. Schatzki ring 2. Bravo capsule 34 cm from the teeth 3. moderate non-erosive gastritis.    HARDWARE REMOVAL Right 12/05/2022   Procedure: RIGHT HUMERUS HARDWARE REMOVAL;  Surgeon: Onesimo Oneil LABOR, MD;  Location: [facility];  Service: Orthopedics;  Laterality: Right;  NEEDS RNFA   HUMERUS IM NAIL Right 06/25/2021   Procedure: INTRAMEDULLARY (IM) NAIL HUMERAL;  Surgeon: Onesimo Oneil LABOR, MD;  Location: [facility];  Service: Orthopedics;  Laterality: Right;   MASS EXCISION N/A 2002   Lanyx   MASS EXCISION Left 01/21/2015   Procedure: EXCISION CYST LEFT UPPER ARM;  Surgeon: Oneil Budge, MD;  Location: [facility];  Service: General;  Laterality: Left;   MICROLARYNGOSCOPY N/A 05/31/2016   Procedure: MICRO LARYNGOSCOPY WITH BIOPSY OF VOCAL CORD LESION;  Surgeon: Daniel Moccasin, MD;  Location: [facility];  Service: ENT;  Laterality: N/A;   POLYPECTOMY N/A 04/07/2015   Procedure: POLYPECTOMY;  Surgeon: Margo LITTIE Haddock, MD;  [facility];  Service: Endoscopy;  Laterality: N/A;  Descending colon polyps x 3    SAVORY DILATION N/A 04/07/2015   Procedure: SAVORY DILATION;  Surgeon: Margo LITTIE Haddock, MD;  [facility];  Service: Endoscopy;  Laterality: N/A;   VULVECTOMY N/A 07/12/2022   Procedure: SIMPLE PARTIAL VULVECTOMY, ADVANCEMENT FLAP;  Surgeon: Eldonna Mays, MD;  [facility];  Service: Gynecology;  Laterality: N/A;    MEDICATIONS:  amLODipine  (NORVASC ) 5 MG tablet   Apoaequorin (PREVAGEN PO)   baclofen  (LIORESAL ) 10 MG tablet   celecoxib  (CELEBREX ) 200 MG capsule   dextromethorphan-guaiFENesin  (MUCINEX  DM) 30-600 MG 12hr tablet   fluticasone  (FLONASE ) 50 MCG/ACT nasal spray   HYDROcodone -acetaminophen  (NORCO/VICODIN) 5-325 MG tablet   Ipratropium-Albuterol  (COMBIVENT  RESPIMAT) 20-100 MCG/ACT AERS respimat   lidocaine  (XYLOCAINE ) 2 % jelly   lovastatin  (MEVACOR ) 20 MG tablet   olmesartan  (BENICAR ) 20 MG tablet   omeprazole  (PRILOSEC) 20 MG capsule    Propylene Glycol (SYSTANE COMPLETE OP)   venlafaxine  XR (EFFEXOR -XR) 150 MG 24 hr capsule   No current facility-administered medications for this encounter.    Burnard CHRISTELLA Odis DEVONNA [facility] Short Stay/Anesthesiology [facility] Phone [phone] 10/02/2023 9:57 AM

## 2023-10-02 NOTE — Telephone Encounter (Signed)
 Unable to reach Laura Mcgee by phone d/t mailbox is full.  I spoke to her daughter [name] to check on pre-operative status. She is aware of pre-operative instructions.  Reinforced nothing to eat after midnight. Clear liquids until 0830. Patient to arrive at 0930.  No questions or concerns voiced.  Instructed to call for any needs.  I will call patient back later today at the request of her daughter.

## 2023-10-02 NOTE — Anesthesia Preprocedure Evaluation (Signed)
 Anesthesia Evaluation  Patient identified by MRN, date of birth, ID band Patient awake    Reviewed: Allergy & Precautions, NPO status , Patient's Chart, lab work & pertinent test results  History of Anesthesia Complications (+) history of anesthetic complications (h/o laryngospasm)  Airway Mallampati: II  TM Distance: >3 FB Neck ROM: Full    Dental  (+) Edentulous Upper, Edentulous Lower   Pulmonary COPD (has needed home O2 in the past),  COPD inhaler, Current Smoker and Patient abstained from smoking.   breath sounds clear to auscultation       Cardiovascular hypertension, Pt. on medications (-) angina + dysrhythmias (advised to not start anticoagulation as well due to ETOH use and falls) Atrial Fibrillation  Rhythm:Irregular Rate:Normal  07/2023 ECHO: EF 50%.  1. The LV has low normal function, no regional wall motion abnormalities. Grade I diastolic dysfunction (impaired relaxation).   2. RVF is low normal. The right ventricular size is mildly enlarged. Tricuspid regurgitation signal is inadequate for assessing PA pressure.   3. The mitral valve is abnormal. Mild mitral valve regurgitation. No evidence of mitral stenosis.   4. The aortic valve is tricuspid. Aortic valve regurgitation is not visualized. No aortic stenosis is present.     Neuro/Psych    Depression    negative neurological ROS     GI/Hepatic ,GERD  Controlled,,(+)     substance abuse  alcohol use  Endo/Other  negative endocrine ROS    Renal/GU negative Renal ROS     Musculoskeletal  (+) Arthritis ,    Abdominal   Peds  Hematology Hb 15.8, plt 267k   Anesthesia Other Findings H/o vulvar cancer  Reproductive/Obstetrics                              Anesthesia Physical Anesthesia Plan  ASA: 3  Anesthesia Plan: General   Post-op Pain Management: Tylenol  PO (pre-op)*   Induction: Intravenous  PONV Risk Score and Plan:  2 and Ondansetron , Dexamethasone  and Treatment may vary due to age or medical condition  Airway Management Planned: LMA  Additional Equipment: None  Intra-op Plan:   Post-operative Plan:   Informed Consent: I have reviewed the patients History and Physical, chart, labs and discussed the procedure including the risks, benefits and alternatives for the proposed anesthesia with the patient or authorized representative who has indicated his/her understanding and acceptance.     Dental advisory given  Plan Discussed with: CRNA and Surgeon  Anesthesia Plan Comments: (See PAT note from 7/25)         Anesthesia Quick Evaluation

## 2023-10-03 ENCOUNTER — Other Ambulatory Visit: Payer: Self-pay

## 2023-10-03 ENCOUNTER — Ambulatory Visit (HOSPITAL_COMMUNITY)
Admission: RE | Admit: 2023-10-03 | Discharge: 2023-10-03 | Disposition: A | Discharge status: Home / Self Care | Attending: Psychiatry | Admitting: Psychiatry

## 2023-10-03 ENCOUNTER — Ambulatory Visit (HOSPITAL_COMMUNITY): Payer: Self-pay | Admitting: Physician Assistant

## 2023-10-03 ENCOUNTER — Ambulatory Visit (HOSPITAL_BASED_OUTPATIENT_CLINIC_OR_DEPARTMENT_OTHER)

## 2023-10-03 ENCOUNTER — Encounter (HOSPITAL_COMMUNITY)
Admission: RE | Disposition: A | Discharge status: Home / Self Care | Payer: Self-pay | Source: Home / Self Care | Attending: Psychiatry

## 2023-10-03 ENCOUNTER — Encounter (HOSPITAL_COMMUNITY): Payer: Self-pay | Admitting: Psychiatry

## 2023-10-03 DIAGNOSIS — I10 Essential (primary) hypertension: Secondary | ICD-10-CM

## 2023-10-03 DIAGNOSIS — I4891 Unspecified atrial fibrillation: Secondary | ICD-10-CM | POA: Diagnosis not present

## 2023-10-03 DIAGNOSIS — I480 Paroxysmal atrial fibrillation: Secondary | ICD-10-CM | POA: Diagnosis not present

## 2023-10-03 DIAGNOSIS — N840 Polyp of corpus uteri: Secondary | ICD-10-CM | POA: Insufficient documentation

## 2023-10-03 DIAGNOSIS — Z01818 Encounter for other preprocedural examination: Secondary | ICD-10-CM

## 2023-10-03 DIAGNOSIS — J449 Chronic obstructive pulmonary disease, unspecified: Secondary | ICD-10-CM

## 2023-10-03 DIAGNOSIS — Z8544 Personal history of malignant neoplasm of other female genital organs: Secondary | ICD-10-CM | POA: Diagnosis not present

## 2023-10-03 DIAGNOSIS — R9389 Abnormal findings on diagnostic imaging of other specified body structures: Secondary | ICD-10-CM

## 2023-10-03 DIAGNOSIS — N841 Polyp of cervix uteri: Secondary | ICD-10-CM | POA: Insufficient documentation

## 2023-10-03 HISTORY — PX: DILATATION & CURRETTAGE/HYSTEROSCOPY WITH RESECTOCOPE: SHX5572

## 2023-10-03 SURGERY — DILATATION & CURETTAGE/HYSTEROSCOPY WITH RESECTOCOPE
Anesthesia: General

## 2023-10-03 MED ORDER — LIDOCAINE HCL (PF) 2 % IJ SOLN
INTRAMUSCULAR | Status: AC
  Filled 2023-10-03: qty 5

## 2023-10-03 MED ORDER — ONDANSETRON HCL 4 MG/2ML IJ SOLN
INTRAMUSCULAR | Status: AC
  Filled 2023-10-03: qty 2

## 2023-10-03 MED ORDER — CHLORHEXIDINE GLUCONATE 0.12 % MT SOLN: 15.0000 mL | Freq: Once | OROMUCOSAL | Status: AC

## 2023-10-03 MED ORDER — LACTATED RINGERS IV SOLN: INTRAVENOUS | Status: DC

## 2023-10-03 MED ORDER — SODIUM CHLORIDE 0.9 % IR SOLN
Status: DC | PRN
  Administered 2023-10-03: 6000 mL

## 2023-10-03 MED ORDER — MIDAZOLAM HCL 2 MG/2ML IJ SOLN
INTRAMUSCULAR | Status: AC
  Filled 2023-10-03: qty 2

## 2023-10-03 MED ORDER — SILVER NITRATE-POT NITRATE 75-25 % EX MISC
CUTANEOUS | Status: DC | PRN
  Administered 2023-10-03: 1

## 2023-10-03 MED ORDER — FENTANYL CITRATE PF 50 MCG/ML IJ SOSY: 25.0000 ug | PREFILLED_SYRINGE | INTRAMUSCULAR | Status: DC | PRN

## 2023-10-03 MED ORDER — PROPOFOL 10 MG/ML IV BOLUS
INTRAVENOUS | Status: AC
  Filled 2023-10-03: qty 20

## 2023-10-03 MED ORDER — SILVER NITRATE-POT NITRATE 75-25 % EX MISC
CUTANEOUS | Status: AC
  Filled 2023-10-03: qty 10

## 2023-10-03 MED ORDER — ORAL CARE MOUTH RINSE
15.0000 mL | Freq: Once | OROMUCOSAL | Status: AC
  Administered 2023-10-03: 15 mL via OROMUCOSAL

## 2023-10-03 MED ORDER — GLYCOPYRROLATE 0.2 MG/ML IJ SOLN
INTRAMUSCULAR | Status: DC | PRN
Start: 2023-10-03 — End: 2023-10-03
  Administered 2023-10-03: .15 mg via INTRAVENOUS

## 2023-10-03 MED ORDER — FENTANYL CITRATE (PF) 100 MCG/2ML IJ SOLN
INTRAMUSCULAR | Status: AC
  Filled 2023-10-03: qty 2

## 2023-10-03 MED ORDER — MIDAZOLAM HCL 5 MG/5ML IJ SOLN
INTRAMUSCULAR | Status: DC | PRN
  Administered 2023-10-03: 1 mg via INTRAVENOUS

## 2023-10-03 MED ORDER — PROPOFOL 10 MG/ML IV BOLUS
INTRAVENOUS | Status: DC | PRN
  Administered 2023-10-03: 100 mg via INTRAVENOUS
  Administered 2023-10-03: 50 mg via INTRAVENOUS

## 2023-10-03 MED ORDER — PHENYLEPHRINE HCL (PRESSORS) 10 MG/ML IV SOLN
INTRAVENOUS | Status: DC | PRN
Start: 2023-10-03 — End: 2023-10-03
  Administered 2023-10-03 (×2): 40 ug via INTRAVENOUS

## 2023-10-03 MED ORDER — LIDOCAINE HCL 1 % IJ SOLN
INTRAMUSCULAR | Status: AC
  Filled 2023-10-03: qty 20

## 2023-10-03 MED ORDER — PHENYLEPHRINE HCL-NACL 20-0.9 MG/250ML-% IV SOLN
INTRAVENOUS | Status: DC | PRN
  Administered 2023-10-03: 10 ug/min via INTRAVENOUS

## 2023-10-03 MED ORDER — OXYCODONE HCL 5 MG PO TABS
ORAL_TABLET | ORAL | Status: AC
Start: 2023-10-03 — End: 2023-10-03
  Filled 2023-10-03: qty 1

## 2023-10-03 MED ORDER — PROPOFOL 500 MG/50ML IV EMUL
INTRAVENOUS | Status: DC | PRN
  Administered 2023-10-03: 10 ug/kg/min via INTRAVENOUS

## 2023-10-03 MED ORDER — OXYCODONE HCL 5 MG/5ML PO SOLN: 5.0000 mg | Freq: Once | ORAL | Status: AC | PRN

## 2023-10-03 MED ORDER — LIDOCAINE HCL 1 % IJ SOLN
INTRAMUSCULAR | Status: DC | PRN
  Administered 2023-10-03: 10 mL

## 2023-10-03 MED ORDER — DEXAMETHASONE SODIUM PHOSPHATE 10 MG/ML IJ SOLN
INTRAMUSCULAR | Status: AC
  Filled 2023-10-03: qty 1

## 2023-10-03 MED ORDER — OXYCODONE HCL 5 MG PO TABS
5.0000 mg | ORAL_TABLET | Freq: Once | ORAL | Status: AC | PRN
  Administered 2023-10-03: 5 mg via ORAL

## 2023-10-03 MED ORDER — EPHEDRINE SULFATE (PRESSORS) 50 MG/ML IJ SOLN
INTRAMUSCULAR | Status: DC | PRN
  Administered 2023-10-03 (×3): 5 mg via INTRAVENOUS

## 2023-10-03 MED ORDER — ACETAMINOPHEN 500 MG PO TABS
1000.0000 mg | ORAL_TABLET | ORAL | Status: AC
  Administered 2023-10-03: 1000 mg via ORAL
  Filled 2023-10-03: qty 2

## 2023-10-03 MED ORDER — FENTANYL CITRATE (PF) 100 MCG/2ML IJ SOLN
INTRAMUSCULAR | Status: DC | PRN
  Administered 2023-10-03: 100 ug via INTRAVENOUS

## 2023-10-03 MED ORDER — MIDAZOLAM HCL 2 MG/2ML IJ SOLN: 0.5000 mg | Freq: Once | INTRAMUSCULAR | Status: DC | PRN

## 2023-10-03 MED ORDER — DEXMEDETOMIDINE HCL IN NACL 80 MCG/20ML IV SOLN
INTRAVENOUS | Status: DC | PRN
  Administered 2023-10-03: 4 ug via INTRAVENOUS

## 2023-10-03 MED ORDER — ONDANSETRON HCL 4 MG/2ML IJ SOLN
INTRAMUSCULAR | Status: DC | PRN
  Administered 2023-10-03: 4 mg via INTRAVENOUS

## 2023-10-03 MED ORDER — LIDOCAINE 2% (20 MG/ML) 5 ML SYRINGE
INTRAMUSCULAR | Status: DC | PRN
  Administered 2023-10-03: 40 mg via INTRAVENOUS

## 2023-10-03 MED ORDER — MEPERIDINE HCL 25 MG/ML IJ SOLN: 6.2500 mg | INTRAMUSCULAR | Status: DC | PRN

## 2023-10-03 MED ORDER — DEXAMETHASONE SODIUM PHOSPHATE 10 MG/ML IJ SOLN
4.0000 mg | INTRAMUSCULAR | Status: AC
  Administered 2023-10-03: 5 mg via INTRAVENOUS

## 2023-10-03 SURGICAL SUPPLY — 37 items
BAG COUNTER SPONGE SURGICOUNT (BAG) ×1 IMPLANT
CATH ROBINSON RED A/P 16FR (CATHETERS) ×2 IMPLANT
CNTNR URN SCR LID CUP LEK RST (MISCELLANEOUS) IMPLANT
COVER SURGICAL LIGHT HANDLE (MISCELLANEOUS) ×1 IMPLANT
CURETTE PIPELLE ENDOMTRL SUCTN (MISCELLANEOUS) ×2 IMPLANT
DEVICE MYOSURE LITE (MISCELLANEOUS) IMPLANT
DEVICE MYOSURE REACH (MISCELLANEOUS) IMPLANT
DILATOR CANAL MILEX (MISCELLANEOUS) IMPLANT
DRSG TELFA 3X8 NADH STRL (GAUZE/BANDAGES/DRESSINGS) ×1 IMPLANT
GAUZE 4X4 16PLY ~~LOC~~+RFID DBL (SPONGE) ×1 IMPLANT
GLOVE BIO SURGEON STRL SZ 6.5 (GLOVE) IMPLANT
GLOVE BIOGEL PI MICRO STRL 6 (GLOVE) ×2 IMPLANT
GOWN STRL REUS W/ TWL LRG LVL3 (GOWN DISPOSABLE) ×2 IMPLANT
IV NS IRRIG 3000ML ARTHROMATIC (IV SOLUTION) ×2 IMPLANT
KIT PROCEDURE FLUENT (KITS) IMPLANT
KIT TURNOVER KIT A (KITS) ×1 IMPLANT
LEGGING LITHOTOMY PAIR STRL (DRAPES) ×1 IMPLANT
LOOP CUTTING BIPOLAR 21FR (ELECTRODE) IMPLANT
LUBRICANT JELLY K Y 4OZ (MISCELLANEOUS) ×1 IMPLANT
MANIFOLD NEPTUNE II (INSTRUMENTS) ×1 IMPLANT
NDL HYPO 22X1.5 SAFETY MO (MISCELLANEOUS) ×1 IMPLANT
NDL SPNL 22GX3.5 QUINCKE BK (NEEDLE) ×1 IMPLANT
NEEDLE HYPO 22X1.5 SAFETY MO (MISCELLANEOUS) ×1 IMPLANT
NEEDLE SPNL 22GX3.5 QUINCKE BK (NEEDLE) ×1 IMPLANT
PACK LITHOTOMY IV (CUSTOM PROCEDURE TRAY) ×1 IMPLANT
PACK VAGINAL MINOR WOMEN LF (CUSTOM PROCEDURE TRAY) ×1 IMPLANT
PAD OB MATERNITY 11 LF (PERSONAL CARE ITEMS) IMPLANT
PENCIL SMOKE EVACUATOR (MISCELLANEOUS) IMPLANT
SCRUB CHG 4% DYNA-HEX 4OZ (MISCELLANEOUS) ×1 IMPLANT
SEAL ROD LENS SCOPE MYOSURE (ABLATOR) IMPLANT
SYR 10ML LL (SYRINGE) ×1 IMPLANT
SYR BULB IRRIG 60ML STRL (SYRINGE) ×1 IMPLANT
SYSTEM TISS REMOVAL MYOSURE XL (MISCELLANEOUS) IMPLANT
TOWEL OR 17X26 10 PK STRL BLUE (TOWEL DISPOSABLE) ×1 IMPLANT
TUBING CONNECTING 10 (TUBING) ×1 IMPLANT
WATER STERILE IRR 500ML POUR (IV SOLUTION) ×1 IMPLANT
YANKAUER SUCT BULB TIP 10FT TU (MISCELLANEOUS) IMPLANT

## 2023-10-03 NOTE — H&P (Signed)
 Brief Pre-operative History & Physical  Patient name: Laura Mcgee CSN: [id number] MRN: [id number] Admit Date: 10/03/2023 Date of Surgery: 10/03/2023 Performing Service: Gynecology   Code Status: Full Code    Assessment & Plan    Laura Mcgee is a 73 y.o. female with THICKENED ENDOMETRIUM, who presents for: Procedure(s) (LRB): DILATATION & CURETTAGE/HYSTEROSCOPY WITH RESECTOCOPE (N/A) MYOSURE RESECTION (N/A).   Consent obtained in office is accurate. Risks, benefits, and alternatives to surgery were reviewed, and all questions were answered. Patient also gives permission for our medical student to perform a bimanual exam at the time of the procedure.  Proceed to the OR as planned.     History of Present Illness:  Laura Mcgee is a 73 y.o. female with THICKENED ENDOMETRIUM. She was recently seen in clinic, where a detailed HPI can be found. She was noted to benefit from: Procedure(s) (LRB): DILATATION & CURETTAGE/HYSTEROSCOPY WITH RESECTOCOPE (N/A) MYOSURE RESECTION (N/A).   Medical History Past Medical History:  Diagnosis Date   Arthritis    Atrial fibrillation (HCC)    Cancer (HCC)    Vaginal   COPD (chronic obstructive pulmonary disease) (HCC)    Depression    Difficult intubation    History of laryngospasm   Dyspnea    Dysrhythmia    Hyperlipidemia    Hypertension    Numbness and tingling    Pneumonia    Pulmonary embolism (HCC) 1973   Surgical History Past Surgical History:  Procedure Laterality Date   CERVIX LESION DESTRUCTION     throat   COLONOSCOPY WITH PROPOFOL  N/A 04/07/2015   SLF: 1. seven colorectal polyps removed 2. the left colon is redundant 3. small internal hemorrhoids (1 simple adenoma, 6 hyperplastic) . Repeat in 5 years.   CYST EXCISION Left    Arm   ESOPHAGOGASTRODUODENOSCOPY (EGD) WITH PROPOFOL  N/A 04/07/2015   SLF: 1. Schatzki ring 2. Bravo capsule 34 cm from the teeth 3. moderate non-erosive gastritis.    HARDWARE REMOVAL Right  12/05/2022   Procedure: RIGHT HUMERUS HARDWARE REMOVAL;  Surgeon: Onesimo Oneil LABOR, MD;  [facility];  Service: Orthopedics;  Laterality: Right;  NEEDS RNFA   HUMERUS IM NAIL Right 06/25/2021   Procedure: INTRAMEDULLARY (IM) NAIL HUMERAL;  Surgeon: Onesimo Oneil LABOR, MD;  [facility];  Service: Orthopedics;  Laterality: Right;   MASS EXCISION N/A 2002   Lanyx   MASS EXCISION Left 01/21/2015   Procedure: EXCISION CYST LEFT UPPER ARM;  Surgeon: Oneil Budge, MD;  [facility];  Service: General;  Laterality: Left;   MICROLARYNGOSCOPY N/A 05/31/2016   Procedure: MICRO LARYNGOSCOPY WITH BIOPSY OF VOCAL CORD LESION;  Surgeon: Daniel Moccasin, MD;  Location: [facility];  Service: ENT;  Laterality: N/A;   POLYPECTOMY N/A 04/07/2015   Procedure: POLYPECTOMY;  Surgeon: Margo LITTIE Haddock, MD;  [facility];  Service: Endoscopy;  Laterality: N/A;  Descending colon polyps x 3    SAVORY DILATION N/A 04/07/2015   Procedure: SAVORY DILATION;  Surgeon: Margo LITTIE Haddock, MD;  [facility];  Service: Endoscopy;  Laterality: N/A;   VULVECTOMY N/A 07/12/2022   Procedure: SIMPLE PARTIAL VULVECTOMY, ADVANCEMENT FLAP;  Surgeon: Eldonna Mays, MD;  [facility];  Service: Gynecology;  Laterality: N/A;   Allergies Gabapentin  and Tetracyclines & related  Medications   Current Facility-Administered Medications  Medication Dose Route Frequency Provider Last Rate Last Admin   dexamethasone  (DECADRON ) injection 4 mg  4 mg Intravenous On Call to OR Cross, Melissa D, NP  lactated ringers  infusion   Intravenous Continuous Boone Fess, MD 10 mL/hr at 10/03/23 1020 Continued from Pre-op at 10/03/23 1020    Vital Signs BP (!) 159/94   Pulse 64   Temp 97.7 F (36.5 C) (Oral)   Resp 18   Ht 5' 6 (1.676 m)   Wt 142 lb (64.4 kg)   LMP 06/20/1997 (Approximate) Comment: post menopausal  SpO2 96%   BMI 22.92 kg/m  Facility age limit for growth %iles is 20 years. Facility age  limit for growth %iles is 20 years..   Physical Exam General: Well developed, appears stated age, in no acute distress  Mental status: Alert and oriented x3 Cardiovascular: Normal Pulmonary: Symmetric chest rise, unlabored breathing Relevant System for Surgery: Surgical site examination deferred to the OR   Labs and Studies: Lab Results  Component Value Date   WBC 8.3 09/29/2023   HGB 15.8 (H) 09/29/2023   HCT 47.9 (H) 09/29/2023   PLT 267 09/29/2023    Lab Results  Component Value Date   INR 1.0 06/25/2021   \

## 2023-10-03 NOTE — Op Note (Signed)
 GYNECOLOGIC ONCOLOGY OPERATIVE NOTE  Date of Service: 10/03/2023  Preoperative Diagnosis: Thickened endometrium  Postoperative Diagnosis: Same  Procedures: Hysteroscopy, dilation and curettage with myosure  Surgeon: Hoy Masters, MD  Assistants: None  Anesthesia: General  Estimated Blood Loss: 0 mL    Fluid deficit: 100 ml  Findings: On speculum exam, polypoid structure in endocervical canal, removed. On hysteroscopy, fluffy endometrium with few polypoid like areas and possible small submucosal fibroids. Bilateral ostia visualized. Gritty endometrial texture at conclusion of case and adequate sampling in all quadrants with myosure.  Specimens:  ID Type Source Tests Collected by Time Destination  1 : Endocervical polyp Tissue PATH Gyn biopsy SURGICAL PATHOLOGY Masters Hoy, MD 10/03/2023 1151   2 : Endometrial curettings Tissue PATH Gyn biopsy SURGICAL PATHOLOGY Masters Hoy, MD 10/03/2023 1243     Complications:  None  Indications for Procedure: Laura Mcgee is a 73 y.o. woman with thickened endometrium noted incidentally on CT scan, redemonstrated on pelvic ultrasound. Office endometrial biopsy with scant sampling. Recommended additional sampling in OR given discordance in findings.  Prior to the procedure, all risks, benefits, and alternatives were discussed and informed surgical consent was signed.  Procedure: Patient was taken to the operating room where general anesthesia was achieved.  She was positioned in dorsal lithotomy and prepped and draped in sterile fashion.   The above findings were noted.  A tenaculum was placed on the anterior lip of the cervix. A cervical block was placed with 10cc of 1% lidocaine . A polyp forceps was used to remove the endocervical polypoid tissue. The cervix was then serially dilated with pratt dilators. The hysteroscope was introduced with the findings as noted above. The hysteroscope was removed and a sharp curettage was performed.  The scope was reintroduced. Given a few areas of fluffy endometrium near the fundus that were unsampled, the myosure lite was introduced and additional sampling obtained in all quadrants. The hysteroscope was removed. The tenaculum was removed. Silver  nitrate was applied in the endocervix and hemostasis was achieved. The speculum was removed.   Patient tolerated the procedure well. Sponge, lap, and instrument counts were correct.  No perioperative antibiotics were indicated for this procedure.  Patient was extubated and taken to the PACU in stable condition.  Hoy Masters, MD Gynecologic Oncology

## 2023-10-03 NOTE — Discharge Instructions (Addendum)
 AFTER SURGERY INSTRUCTIONS   Return to work:  1-2 days if applicable   Activity: 1. Be up and out of the bed during the day.  Take a nap if needed.  You may walk up steps but be careful and use the hand rail.  Stair climbing will tire you more than you think, you may need to stop part way and rest.    2. No lifting or straining for 2 weeks over 10 pounds. No pushing, pulling, straining for 2 weeks.   3. No driving for minimum 24 hours after surgery.  Do not drive if you are taking narcotic pain medicine and make sure that your reaction time has returned.    4. You can shower as soon as the next day after surgery. Shower daily. No tub baths or submerging your body in water until cleared by your surgeon (2 weeks minimum). If you have the soap that was given to you by pre-surgical testing that was used before surgery, you do not need to use it afterwards because this can irritate your incisions.    5. No sexual activity and nothing in the vagina for 2 weeks.   6. You may experience vaginal spotting and discharge after surgery.  The spotting is normal but if you experience heavy bleeding, call our office.   7. Take Tylenol  and ibuprofen for pain if you are able to take these medications for discomfort as needed.     Diet: 1. Low sodium Heart Healthy Diet is recommended but you are cleared to resume your normal (before surgery) diet after your procedure.   2. It is safe to use a laxative, such as Miralax  or Colace, if you have difficulty moving your bowels.    Wound Care: 1. Keep clean and dry.  Shower daily.   Reasons to call the Doctor: Fever - Oral temperature greater than 100.4 degrees Fahrenheit Foul-smelling vaginal discharge Difficulty urinating Nausea and vomiting Increased pain at the site of the incision that is unrelieved with pain medicine. Difficulty breathing with or without chest pain New calf pain especially if only on one side Sudden, continuing increased vaginal  bleeding with or without clots.   Contacts: For questions or concerns you should contact:   Dr. Hoy Masters at [phone]   Eleanor Epps, NP at [phone]   After Hours: call [phone] and have the GYN Oncologist paged/contacted (after 5 pm or on the weekends).   Messages sent via mychart are for non-urgent matters and are not responded to after hours so for urgent needs, please call the after hours number.

## 2023-10-03 NOTE — Transfer of Care (Signed)
 Immediate Anesthesia Transfer of Care Note  Patient: Laura Mcgee  Procedure(s) Performed: HYSTEROSCOPY, DILATION AND CURETTAGE WITH MYOSURE  Patient Location: PACU  Anesthesia Type:General  Level of Consciousness: awake, alert , oriented, and patient cooperative  Airway & Oxygen Therapy: Patient Spontanous Breathing and Patient connected to face mask oxygen  Post-op Assessment: Report given to RN and Post -op Vital signs reviewed and stable  Post vital signs: Reviewed and stable  Last Vitals:  Vitals Value Taken Time  BP 138/87 10/03/23 13:21  Temp 35.9 C 10/03/23 13:21  Pulse 74 10/03/23 13:24  Resp 14 10/03/23 13:24  SpO2 100 % 10/03/23 13:24  Vitals shown include unfiled device data.  Last Pain:  Vitals:   10/03/23 1321  TempSrc:   PainSc: 0-No pain         Complications: No notable events documented.

## 2023-10-03 NOTE — Anesthesia Postprocedure Evaluation (Signed)
 Anesthesia Post Note  Patient: Erian Lariviere  Procedure(s) Performed: HYSTEROSCOPY, DILATION AND CURETTAGE WITH MYOSURE     Patient location during evaluation: PACU Anesthesia Type: General Level of consciousness: awake and alert, oriented and patient cooperative Pain management: pain level controlled Vital Signs Assessment: post-procedure vital signs reviewed and stable Respiratory status: spontaneous breathing, nonlabored ventilation and respiratory function stable Cardiovascular status: blood pressure returned to baseline and stable Postop Assessment: no apparent nausea or vomiting Anesthetic complications: no   No notable events documented.  Last Vitals:  Vitals:   10/03/23 1330 10/03/23 1345  BP: 125/84 127/82  Pulse: 77 74  Resp: 12 15  Temp:    SpO2: 100% 95%    Last Pain:  Vitals:   10/03/23 1330  TempSrc:   PainSc: 0-No pain                 [place],E. Adalai Perl

## 2023-10-03 NOTE — Anesthesia Procedure Notes (Signed)
 Procedure Name: LMA Insertion Date/Time: 10/03/2023 12:12 PM  Performed by: Rhodia Debby MATSU, CRNAPre-anesthesia Checklist: Patient identified, Emergency Drugs available, Suction available, Patient being monitored and Timeout performed Patient Re-evaluated:Patient Re-evaluated prior to induction Oxygen Delivery Method: Circle system utilized Preoxygenation: Pre-oxygenation with 100% oxygen Induction Type: IV induction Ventilation: Mask ventilation without difficulty LMA: LMA inserted LMA Size: 4.0 Number of attempts: 1 Placement Confirmation: positive ETCO2 and breath sounds checked- equal and bilateral ETT to lip (cm): yes. Tube secured with: Tape Dental Injury: Teeth and Oropharynx as per pre-operative assessment  Comments: Brief atraumatic gums lips and dentition unchanged from preop

## 2023-10-04 ENCOUNTER — Telehealth: Payer: Self-pay | Admitting: *Deleted

## 2023-10-04 ENCOUNTER — Encounter (HOSPITAL_COMMUNITY): Payer: Self-pay | Admitting: Psychiatry

## 2023-10-04 LAB — SURGICAL PATHOLOGY

## 2023-10-04 NOTE — Telephone Encounter (Signed)
 Spoke with Ms. Clinard's daughter Luke this morning. She states LauraMcgee she is eating, drinking and urinating well. She has not had a BM yet but is passing gas.  Encouraged her to drink plenty of water . She denies fever or chills, and no vaginal bleeding or spotting. She rates her pain 0/10.   Instructed to call office with any fever, chills, purulent drainage, uncontrolled pain or any other questions or concerns. Patient verbalizes understanding.   Pt aware of post op appointments as well as the office number [phone] and after hours number [phone] to call if she has any questions or concerns

## 2023-10-04 NOTE — Telephone Encounter (Signed)
 Attempted to reach patient for post op call. Unable to leave a message due to mail box is full.

## 2023-10-04 NOTE — Telephone Encounter (Signed)
 Attempted to reach patient's daughter Luke. Left voicemail requesting call back.

## 2023-10-05 ENCOUNTER — Ambulatory Visit: Payer: Self-pay | Admitting: Psychiatry

## 2023-10-16 ENCOUNTER — Inpatient Hospital Stay: Admitting: Psychiatry

## 2023-10-16 ENCOUNTER — Telehealth: Payer: Self-pay | Admitting: *Deleted

## 2023-10-16 DIAGNOSIS — C519 Malignant neoplasm of vulva, unspecified: Secondary | ICD-10-CM

## 2023-10-16 NOTE — Progress Notes (Signed)
 This encounter was created in error - please disregard.

## 2023-10-16 NOTE — Telephone Encounter (Signed)
 Spoke with patient who called the office stating she needs to reschedule her post op follow up with Dr. Eldonna for today. Pt was given a new appointment for Monday, August 18 th at 1545. Pt agreed to date and time and had no further concerns at this time.

## 2023-10-17 NOTE — Progress Notes (Unsigned)
 Gynecologic Oncology Return Clinic Visit  Date of Service: 10/16/2023 Referring Provider: Delon Lewis, NP Delon Prude, DO  Assessment & Plan: Laura Mcgee is a 73 y.o. woman with stage IA SCC of the vulva s/p simple partial vulvectomy with advancement flap on 07/12/22 and CT in December to r/o biliary duct obstruction with incidental finding of endometrial thickening. Now s/p hysteroscopy, D&C with myosure on 10/03/23.  Postop: - Pt recovering well from surgery and healing appropriately postoperatively - Intraoperative findings and pathology results reviewed. Benign endometrial and endocervical polyps.   Vulvar cancer: - NED on exam (>72yr) - Given only 1mm DOI, negative margins, okay to proceed with close surveillance.  - Signs/symptoms of recurrence reviewed. - Continue surveillance q5mo for the first 2 years.   Chronic pain: - Has established with new PCP in [place].  For treatment to PCP.  RTC ***.  Hoy Masters, MD Gynecologic Oncology   Medical Decision Making I personally spent  TOTAL *** minutes face-to-face and non-face-to-face in the care of this patient, which includes all pre, intra, and post visit time on the date of service. The discussion of *** is beyond the scope of routine postoperative care.   ----------------------- Reason for Visit: Postop***  Treatment History: Oncology History  Vulvar cancer (HCC)  07/12/2022 Surgery   Simple partial vulvectomy, advancement flap    07/12/2022 Initial Diagnosis   Vulvar cancer (HCC)   07/12/2022 Pathology Results   FINAL MICROSCOPIC DIAGNOSIS:   A. VULVA, POSTERIOR, PARTIAL VULVECTOMY:  - Superficially invasive squamous cell carcinoma arising in high grade  squamous intraepithelial lesion, HPV associated (see Comment)  - Margins not involved by high grade dysplasia or carcinoma   COMMENT:  - Morphologic evaluation reveals multifocal areas of superficial  invasion up to 0.1 cm depth of invasion. P53  shows wild type staining.  P16 is strong positive in the lesion. This case was reviewed with Dr. Macie  who agrees with the diagnosis.    07/12/2022 Cancer Staging   Staging form: Vulva, AJCC V9 - Pathologic stage from 07/12/2022: FIGO Stage IA (pT1a, pNX, cM0) - Signed by Masters Hoy, MD on 08/10/2022 Histopathologic type: Squamous cell carcinoma, NOS Stage prefix: Initial diagnosis     Interval History: Pt reports that she is recovering well from surgery. She is using *** for pain. She is eating and drinking well. She is voiding without issue and having regular bowel movements.   ***  Past Medical/Surgical History: Past Medical History:  Diagnosis Date   Arthritis    Atrial fibrillation (HCC)    Cancer (HCC)    Vaginal   COPD (chronic obstructive pulmonary disease) (HCC)    Depression    Difficult intubation    History of laryngospasm   Dyspnea    Dysrhythmia    Hyperlipidemia    Hypertension    Numbness and tingling    Pneumonia    Pulmonary embolism (HCC) 1973    Past Surgical History:  Procedure Laterality Date   CERVIX LESION DESTRUCTION     throat   COLONOSCOPY WITH PROPOFOL  N/A 04/07/2015   SLF: 1. seven colorectal polyps removed 2. the left colon is redundant 3. small internal hemorrhoids (1 simple adenoma, 6 hyperplastic) . Repeat in 5 years.   CYST EXCISION Left    Arm   DILATATION & CURRETTAGE/HYSTEROSCOPY WITH RESECTOCOPE N/A 10/03/2023   Procedure: HYSTEROSCOPY, DILATION AND CURETTAGE WITH MYOSURE;  Surgeon: Masters Hoy, MD;  Location: [facility];  Service: Gynecology;  Laterality: N/A;   ESOPHAGOGASTRODUODENOSCOPY (EGD)  WITH PROPOFOL  N/A 04/07/2015   SLF: 1. Schatzki ring 2. Bravo capsule 34 cm from the teeth 3. moderate non-erosive gastritis.    HARDWARE REMOVAL Right 12/05/2022   Procedure: RIGHT HUMERUS HARDWARE REMOVAL;  Surgeon: Onesimo Oneil LABOR, MD;  Location: [facility];  Service: Orthopedics;  Laterality: Right;  NEEDS RNFA   HUMERUS IM NAIL Right  06/25/2021   Procedure: INTRAMEDULLARY (IM) NAIL HUMERAL;  Surgeon: Onesimo Oneil LABOR, MD;  Location: [facility];  Service: Orthopedics;  Laterality: Right;   MASS EXCISION N/A 2002   Lanyx   MASS EXCISION Left 01/21/2015   Procedure: EXCISION CYST LEFT UPPER ARM;  Surgeon: Oneil Budge, MD;  Location: [facility];  Service: General;  Laterality: Left;   MICROLARYNGOSCOPY N/A 05/31/2016   Procedure: MICRO LARYNGOSCOPY WITH BIOPSY OF VOCAL CORD LESION;  Surgeon: Daniel Moccasin, MD;  Location: [facility];  Service: ENT;  Laterality: N/A;   POLYPECTOMY N/A 04/07/2015   Procedure: POLYPECTOMY;  Surgeon: Margo LITTIE Haddock, MD;  Location: [facility];  Service: Endoscopy;  Laterality: N/A;  Descending colon polyps x 3    SAVORY DILATION N/A 04/07/2015   Procedure: SAVORY DILATION;  Surgeon: Margo LITTIE Haddock, MD;  [facility];  Service: Endoscopy;  Laterality: N/A;   VULVECTOMY N/A 07/12/2022   Procedure: SIMPLE PARTIAL VULVECTOMY, ADVANCEMENT FLAP;  Surgeon: Eldonna Mays, MD;  Location: [facility];  Service: Gynecology;  Laterality: N/A;    Family History  Problem Relation Age of Onset   Pneumonia Mother    Aneurysm Father    Kidney cancer Sister    Lung cancer Sister    Esophageal cancer Brother    Melanoma Maternal Grandfather    Diabetes Daughter    Colon cancer Neg Hx    Breast cancer Neg Hx    Ovarian cancer Neg Hx    Endometrial cancer Neg Hx    Pancreatic cancer Neg Hx    Prostate cancer Neg Hx     Social History   Socioeconomic History   Marital status: Single    Spouse name: Not on file   Number of children: 1   Years of education: some college   Highest education level: Not on file  Occupational History   Occupation: Retired  Tobacco Use   Smoking status: Every Day    Current packs/day: 1.00    Average packs/day: 1 pack/day for 25.0 years (25.0 ttl pk-yrs)    Types: Cigarettes    Passive exposure: Current   Smokeless tobacco: Never   Tobacco comments:    1  ppd as of 06/08/23  Vaping Use   Vaping status: Never Used  Substance and Sexual Activity   Alcohol use: Yes    Comment: Drinks two  20 oz beer per night.   Drug use: Never    Types: Marijuana    Comment: daily marijuana use, not since 03/2022   Sexual activity: Not Currently    Birth control/protection: Post-menopausal  Other Topics Concern   Not on file  Social History Narrative   Lives at home alone.   Right-handed.   Two cups sweet tea daily.   Social Drivers of Corporate investment banker Strain: Low Risk  (11/30/2022)   Received from [facility]   Overall Financial Resource Strain (CARDIA)    Difficulty of Paying Living Expenses: Not hard at all  Food Insecurity: No Food Insecurity (07/17/2023)   Hunger Vital Sign    Worried About Running Out of Food in  the Last Year: Never true    Ran Out of Food in the Last Year: Never true  Transportation Needs: Unmet Transportation Needs (07/17/2023)   PRAPARE - Transportation    Lack of Transportation (Medical): Yes    Lack of Transportation (Non-Medical): Yes  Physical Activity: Sufficiently Active (11/30/2022)   Received from [facility]   Exercise Vital Sign    On average, how many days per week do you engage in moderate to strenuous exercise (like a brisk walk)?: 5 days    On average, how many minutes do you engage in exercise at this level?: 30 min  Stress: No Stress Concern Present (11/30/2022)   Received from [facility] - Occupational Stress Questionnaire    Feeling of Stress : Not at all  Social Connections: Socially Isolated (07/17/2023)   Social Connection and Isolation Panel    Frequency of Communication with Friends and Family: Once a week    Frequency of Social Gatherings with Friends and Family: Once a week    Attends Religious Services: 1 to 4 times per year    Active Member of [employer] or Organizations: No    Attends Banker Meetings: Never    Marital  Status: Divorced    Current Medications:  Current Outpatient Medications:    amLODipine  (NORVASC ) 5 MG tablet, Take 1 tablet (5 mg total) by mouth daily., Disp: 90 tablet, Rfl: 3   Apoaequorin (PREVAGEN PO), Take 1 tablet by mouth daily., Disp: , Rfl:    baclofen  (LIORESAL ) 10 MG tablet, Take 1 tablet (10 mg total) by mouth daily as needed for muscle spasms (laryngospasm)., Disp: , Rfl:    celecoxib  (CELEBREX ) 200 MG capsule, Take 1 capsule (200 mg total) by mouth 2 (two) times daily., Disp: 60 capsule, Rfl: 3   dextromethorphan-guaiFENesin  (MUCINEX  DM) 30-600 MG 12hr tablet, Take 1 tablet by mouth 2 (two) times daily as needed for cough., Disp: 10 tablet, Rfl: 0   fluticasone  (FLONASE ) 50 MCG/ACT nasal spray, Place 2 sprays into both nostrils daily., Disp: 16 g, Rfl: 2   HYDROcodone -acetaminophen  (NORCO/VICODIN) 5-325 MG tablet, Take 1 tablet by mouth every 12 (twelve) hours as needed for moderate pain (pain score 4-6) or severe pain (pain score 7-10)., Disp: 60 tablet, Rfl: 0   Ipratropium-Albuterol  (COMBIVENT  RESPIMAT) 20-100 MCG/ACT AERS respimat, Inhale 1 puff into the lungs every 6 (six) hours as needed., Disp: 4 g, Rfl: 1   lidocaine  (XYLOCAINE ) 2 % jelly, Apply 1 Application topically as needed., Disp: 30 mL, Rfl: 3   lovastatin  (MEVACOR ) 20 MG tablet, Take 1 tablet (20 mg total) by mouth in the morning., Disp: 90 tablet, Rfl: 1   olmesartan  (BENICAR ) 20 MG tablet, Take 1 tablet (20 mg total) by mouth daily., Disp: 90 tablet, Rfl: 3   omeprazole  (PRILOSEC) 20 MG capsule, Take 1 capsule (20 mg total) by mouth daily., Disp: 30 capsule, Rfl: 3   Propylene Glycol (SYSTANE COMPLETE OP), Place 1 drop into both eyes in the morning, at noon, and at bedtime., Disp: , Rfl:    venlafaxine  XR (EFFEXOR -XR) 150 MG 24 hr capsule, Take 2 capsules (300 mg total) by mouth every evening. (Patient taking differently: Take 150 mg by mouth daily.), Disp: 60 capsule, Rfl: 0  Review of Symptoms: Complete  10-system review is negative except ***as above in Interval History.  Physical Exam: LMP 06/20/1997 (Approximate) Comment: post menopausal General: ***Alert, oriented, no acute distress. HEENT: ***Normocephalic, atraumatic. Neck  symmetric without masses. Sclera anicteric.  Chest: ***Normal work of breathing. ***Clear to auscultation bilaterally.   Cardiovascular: ***Regular rate and rhythm, no murmurs. Abdomen: ***Soft, nontender.  Normoactive bowel sounds.  No masses appreciated.  ***Well-healing incision***s. Extremities: ***Grossly normal range of motion.  Warm, well perfused.  No edema bilaterally. Skin: ***No rashes or lesions noted. GU: Normal appearing external genitalia without erythema, excoriation, or lesions.  Speculum exam reveals ***.  Bimanual exam reveals ***. Exam chaperoned by ***  Laboratory & Radiologic Studies: Surgical pathology (10/03/23): A. ENDOCERVICAL POLYP:  - Benign endocervical polyp with acute and chronic inflammation.  - Fragment of squamous mucosa, negative for dysplasia and malignancy.   B. ENDOMETRIAL CURRRETTINGS:  - Benign endometrial polyp(s), multiple polypoid fragments.  - Non-polypoid endometrium with weakly reactive glands.  - Negative for endometrial hyperplasia, intraepithelial neoplasia (EIN)  and malignancy.  - Squamous fragments without identified dysplasia or malignancy.

## 2023-10-20 ENCOUNTER — Other Ambulatory Visit: Payer: Self-pay | Admitting: Internal Medicine

## 2023-10-20 DIAGNOSIS — G894 Chronic pain syndrome: Secondary | ICD-10-CM

## 2023-10-20 DIAGNOSIS — M48062 Spinal stenosis, lumbar region with neurogenic claudication: Secondary | ICD-10-CM

## 2023-10-20 MED ORDER — HYDROCODONE-ACETAMINOPHEN 5-325 MG PO TABS: 1.0000 | ORAL_TABLET | Freq: Two times a day (BID) | ORAL | 0 refills | Status: DC | PRN

## 2023-10-20 NOTE — Telephone Encounter (Signed)
 Copied from CRM #[id number]. Topic: Clinical - Medication Refill >> Oct 20, 2023 11:19 AM Charlet HERO wrote: Medication: HYDROcodone -acetaminophen  (NORCO/VICODIN) 5-325 MG tablet  novastatin 10mg ??  Has the patient contacted their pharmacy? Yes narcotic  This is the patient's preferred pharmacy:  [facility] - [place], [place] - [address] [place] [address] Phone: [phone] Fax: [phone]   Is this the correct pharmacy for this prescription? Yes If no, delete pharmacy and type the correct one.   Has the prescription been filled recently? Yes  Is the patient out of the medication? Yes  Has the patient been seen for an appointment in the last year OR does the patient have an upcoming appointment? Yes  Can we respond through MyChart? No  Agent: Please be advised that Rx refills may take up to 3 business days. We ask that you follow-up with your pharmacy.

## 2023-10-23 ENCOUNTER — Encounter: Payer: Self-pay | Admitting: Psychiatry

## 2023-10-23 ENCOUNTER — Inpatient Hospital Stay: Attending: Psychiatry | Admitting: Psychiatry

## 2023-10-23 VITALS — BP 140/82 | HR 73 | Temp 97.6°F | Resp 19 | Wt 146.0 lb

## 2023-10-23 DIAGNOSIS — C519 Malignant neoplasm of vulva, unspecified: Secondary | ICD-10-CM

## 2023-10-23 DIAGNOSIS — N840 Polyp of corpus uteri: Secondary | ICD-10-CM

## 2023-10-23 NOTE — Patient Instructions (Signed)
It was a pleasure to see you in clinic today. - Normal exam today. - Return visit planned for 6 months.  Thank you very much for allowing me to provide care for you today.  I appreciate your confidence in choosing our Gynecologic Oncology team at [facility].  If you have any questions about your visit today please call our office or send [place] a MyChart message and we will get back to you as soon as possible.

## 2023-10-23 NOTE — Addendum Note (Signed)
 Addended by: Ura Yingling B on: 10/23/2023 04:17 PM   Modules accepted: Orders

## 2023-11-02 ENCOUNTER — Ambulatory Visit: Payer: Self-pay

## 2023-11-02 ENCOUNTER — Other Ambulatory Visit: Payer: Self-pay | Admitting: Internal Medicine

## 2023-11-02 DIAGNOSIS — W5501XA Bitten by cat, initial encounter: Secondary | ICD-10-CM

## 2023-11-02 MED ORDER — AMOXICILLIN-POT CLAVULANATE 875-125 MG PO TABS: 1.0000 | ORAL_TABLET | Freq: Two times a day (BID) | ORAL | 0 refills | Status: DC

## 2023-11-02 NOTE — Telephone Encounter (Signed)
 FYI Only or Action Required?: Action required by provider: wants antibiotics called in for a cat bite; states no money and no gas.  Patient was last seen in primary care on 09/12/2023 by Tobie Suzzane POUR, MD.  Called Nurse Triage reporting Animal Bite.  Symptoms began several days ago.  Interventions attempted: Nothing.  Symptoms are: gradually worsening.  Triage Disposition: See HCP Within 4 Hours (Or PCP Triage)  Patient/caregiver understands and will follow disposition?:      Copied from CRM #[id number]. Topic: Clinical - Red Word Triage >> Nov 02, 2023  1:03 PM Avram MATSU wrote: Red Word that prompted transfer to Nurse Triage: patient for bit on her finger, its red and sore. Her hand was swollen.pt has blood and puss. Reason for Disposition  [1] Puncture wound (hole through the skin) AND [2] from a cat bite (or deep claw puncture wound)  Answer Assessment - Initial Assessment Questions 1. APPEARANCE What does it look like?  (e.g., abrasion, bruise, puncture)      Red and sore and puss coming out 2. SIZE: How big is the bite? (e.g., inches, cm; or compare to size of coin, pea, grape, ping pong ball)      Cat bite 3. LOCATION: Where is the bite located?      Left hand and first finger 4. ONSET: When did the bite happen? (e.g., minutes, hours ago)      Saturday 5. ANIMAL: What type of animal caused the bite? Is the injury from a bite or a claw? If the animal is a dog or a cat, ask: Was it a pet or a stray? Was it acting ill or behaving strangely?     cat 6. RABIES VACCINE: For dog or cat bites, ask: Do you know if the pet is vaccinated against rabies?  (e.g., yes, no, overdue for rabies shot, unknown)     no 7. CIRCUMSTANCES: Tell me how this happened.      States was moving and had people helping and he hid in a drawer and she took him out and he bit her. 8. TETANUS: When was your last tetanus booster?     Years ago 9. PREGNANCY: Is there any chance you are  pregnant? When was your last menstrual period?     na  Protocols used: Animal Bite-A-AH

## 2023-11-03 NOTE — Telephone Encounter (Signed)
 Left detailed vm

## 2023-11-07 ENCOUNTER — Ambulatory Visit: Admitting: Internal Medicine

## 2023-11-15 ENCOUNTER — Ambulatory Visit: Payer: Self-pay

## 2023-11-15 NOTE — Telephone Encounter (Signed)
 ear infection, vomiting, diarrea     Nurse attempted to return pt's call: no answer: left voicemail.

## 2023-11-15 NOTE — Telephone Encounter (Signed)
 FYI Only or Action Required?: Action required by provider: Requesting medication for symptoms.  Patient was last seen in primary care on 09/12/2023 by Laura Suzzane POUR, MD.  Called Nurse Triage reporting Ear Fullness.  Symptoms began several days ago.  Interventions attempted: Prescription medications: Leftover Augmentin .  Symptoms are: gradually worsening.  Triage Disposition: See PCP When Office is Open (Within 3 Days)  Patient/caregiver understands and will follow disposition?: No, wishes to speak with PCP   Reason for Disposition  [1] Ear congestion lasts > 3 days AND [2] no improvement after using Care Advice  (Exception: Ear congestion is a chronic symptom.)  Answer Assessment - Initial Assessment Questions Pt states she has had an infection in that ear twice before. Patient states she is taking leftover Augmentin  from a cat bite for symptoms. Pt offered multiple appointments in office and refused. Pt also refused an UC appointment due to costs. Pt requesting medication to be sent in to pharmacy below if possible. Patient requesting a callback regarding medication.   [facility] - [place], [place] - [address]  [address] [place], [place] [address]      1. LOCATION: Which ear is involved?       L ear  2. SENSATION: Describe how the ear feels. (e.g., stuffy, full, plugged).      Ear feels stopped up  3. ONSET:  When did the ear symptoms start?       A couple of days ago  4. PAIN: Do you also have an earache? If Yes, ask: How bad is it? (Scale 0-10; none, mild, moderate or severe)     Denies pain  5. CAUSE: What do you think is causing the ear congestion? (e.g., common cold, nasal allergies, recent flight, recent snorkeling)     Possible ear infection  6. OTHER SYMPTOMS: Do you have any other symptoms? (e.g., ear drainage, hay fever symptoms such as sneezing or a clear nasal discharge; cold symptoms such as a cough or runny nose)     Nausea,  diarrhea, and sharp pain in stomach  Protocols used: Ear - Congestion-A-AH

## 2023-11-16 NOTE — Telephone Encounter (Signed)
 LVM for pt to call back and schedule- offered 11:40 am today or tomorrow per Tobie

## 2023-11-17 ENCOUNTER — Encounter: Payer: Self-pay | Admitting: Internal Medicine

## 2023-11-17 ENCOUNTER — Ambulatory Visit (INDEPENDENT_AMBULATORY_CARE_PROVIDER_SITE_OTHER): Admitting: Internal Medicine

## 2023-11-17 VITALS — BP 132/83 | HR 109 | Ht 66.0 in | Wt 137.6 lb

## 2023-11-17 DIAGNOSIS — J0111 Acute recurrent frontal sinusitis: Secondary | ICD-10-CM | POA: Diagnosis not present

## 2023-11-17 DIAGNOSIS — R42 Dizziness and giddiness: Secondary | ICD-10-CM

## 2023-11-17 MED ORDER — AZITHROMYCIN 250 MG PO TABS: ORAL_TABLET | ORAL | 0 refills | Status: AC

## 2023-11-17 MED ORDER — MECLIZINE HCL 25 MG PO TABS
25.0000 mg | ORAL_TABLET | Freq: Two times a day (BID) | ORAL | 0 refills | Status: AC | PRN
Start: 2023-11-17 — End: ?

## 2023-11-17 NOTE — Assessment & Plan Note (Addendum)
 Current dizziness with nausea could be due to vertigo from recent URTI Chronic dizziness likely multifactorial, due to orthostatic hypotension from dehydration, chronic sinusitis, alcohol abuse and peripheral neuropathy Meclizine  as needed for dizziness Needs to use Flonase  regularly Needs to avoid alcohol Advised to maintain at least 64 ounces of fluid intake in a day

## 2023-11-17 NOTE — Assessment & Plan Note (Signed)
 Started empiric azithromycin  as she has persistent symptoms with despite symptomatic treatment Continue Flonase  for nasal congestion/allergies Advised to avoid excessive blowing of the nose, which can worsen ear pain

## 2023-11-17 NOTE — Patient Instructions (Signed)
 Please start taking Azithromycin  as prescribed.  Please take Meclizine  as needed for dizziness.  Please maintain at least 64 ounces of fluid intake in a day and eat at regular intervals.

## 2023-11-17 NOTE — Progress Notes (Signed)
 Acute Office Visit  Subjective:    Patient ID: Laura Mcgee, female    DOB: 1950/11/12, 73 y.o.   MRN: [id number]  Chief Complaint  Patient presents with   Ear Pain    Pt reports sx of left ear pain, causing nausea and headache, ongoing for 3 days.     HPI Patient is in today for complaint of nasal congestion, sinus pressure related headache, left-sided ear pain and dizziness for the last 3 days.  Denies any fever, chills.  Denies ear discharge currently.  She also reports episodes of nausea and vomiting in the last 3 days.  She has a history of otitis media in the past.  She still continues to smoke about 0.5 pack/day she recently completed Augmentin  for a cat bite.  Past Medical History:  Diagnosis Date   Arthritis    Atrial fibrillation (HCC)    Cancer (HCC)    Vaginal   COPD (chronic obstructive pulmonary disease) (HCC)    Depression    Difficult intubation    History of laryngospasm   Dyspnea    Dysrhythmia    Hyperlipidemia    Hypertension    Numbness and tingling    Pneumonia    Pulmonary embolism (HCC) 1973    Past Surgical History:  Procedure Laterality Date   CERVIX LESION DESTRUCTION     throat   COLONOSCOPY WITH PROPOFOL  N/A 04/07/2015   SLF: 1. seven colorectal polyps removed 2. the left colon is redundant 3. small internal hemorrhoids (1 simple adenoma, 6 hyperplastic) . Repeat in 5 years.   CYST EXCISION Left    Arm   DILATATION & CURRETTAGE/HYSTEROSCOPY WITH RESECTOCOPE N/A 10/03/2023   Procedure: HYSTEROSCOPY, DILATION AND CURETTAGE WITH MYOSURE;  Surgeon: Eldonna Mays, MD;  Location: [facility];  Service: Gynecology;  Laterality: N/A;   ESOPHAGOGASTRODUODENOSCOPY (EGD) WITH PROPOFOL  N/A 04/07/2015   SLF: 1. Schatzki ring 2. Bravo capsule 34 cm from the teeth 3. moderate non-erosive gastritis.    HARDWARE REMOVAL Right 12/05/2022   Procedure: RIGHT HUMERUS HARDWARE REMOVAL;  Surgeon: Onesimo Oneil LABOR, MD;  Location: [facility];  Service: Orthopedics;   Laterality: Right;  NEEDS RNFA   HUMERUS IM NAIL Right 06/25/2021   Procedure: INTRAMEDULLARY (IM) NAIL HUMERAL;  Surgeon: Onesimo Oneil LABOR, MD;  Location: [facility];  Service: Orthopedics;  Laterality: Right;   MASS EXCISION N/A 2002   Lanyx   MASS EXCISION Left 01/21/2015   Procedure: EXCISION CYST LEFT UPPER ARM;  Surgeon: Oneil Budge, MD;  Location: [facility];  Service: General;  Laterality: Left;   MICROLARYNGOSCOPY N/A 05/31/2016   Procedure: MICRO LARYNGOSCOPY WITH BIOPSY OF VOCAL CORD LESION;  Surgeon: Daniel Moccasin, MD;  Location: [facility];  Service: ENT;  Laterality: N/A;   POLYPECTOMY N/A 04/07/2015   Procedure: POLYPECTOMY;  Surgeon: Margo LITTIE Haddock, MD;  Location: [facility];  Service: Endoscopy;  Laterality: N/A;  Descending colon polyps x 3    SAVORY DILATION N/A 04/07/2015   Procedure: SAVORY DILATION;  Surgeon: Margo LITTIE Haddock, MD;  Location: [facility];  Service: Endoscopy;  Laterality: N/A;   VULVECTOMY N/A 07/12/2022   Procedure: SIMPLE PARTIAL VULVECTOMY, ADVANCEMENT FLAP;  Surgeon: Eldonna Mays, MD;  Location: [facility];  Service: Gynecology;  Laterality: N/A;    Family History  Problem Relation Age of Onset   Pneumonia Mother    Aneurysm Father    Kidney cancer Sister    Lung cancer Sister    Esophageal cancer Brother  Melanoma Maternal Grandfather    Diabetes Daughter    Colon cancer Neg Hx    Breast cancer Neg Hx    Ovarian cancer Neg Hx    Endometrial cancer Neg Hx    Pancreatic cancer Neg Hx    Prostate cancer Neg Hx     Social History   Socioeconomic History   Marital status: Single    Spouse name: Not on file   Number of children: 1   Years of education: some college   Highest education level: Not on file  Occupational History   Occupation: Retired  Tobacco Use   Smoking status: Every Day    Current packs/day: 1.00    Average packs/day: 1 pack/day for 25.0 years (25.0 ttl pk-yrs)    Types: Cigarettes    Passive exposure: Current    Smokeless tobacco: Never   Tobacco comments:    1 ppd as of 06/08/23  Vaping Use   Vaping status: Never Used  Substance and Sexual Activity   Alcohol use: Yes    Comment: Drinks two  20 oz beer per night.   Drug use: Never    Types: Marijuana    Comment: daily marijuana use, not since 03/2022   Sexual activity: Not Currently    Birth control/protection: Post-menopausal  Other Topics Concern   Not on file  Social History Narrative   Lives at home alone.   Right-handed.   Two cups sweet tea daily.   Social Drivers of Corporate investment banker Strain: Low Risk  (11/30/2022)   Received from [facility]   Overall Financial Resource Strain (CARDIA)    Difficulty of Paying Living Expenses: Not hard at all  Food Insecurity: No Food Insecurity (07/17/2023)   Hunger Vital Sign    Worried About Running Out of Food in the Last Year: Never true    Ran Out of Food in the Last Year: Never true  Transportation Needs: Unmet Transportation Needs (07/17/2023)   PRAPARE - Transportation    Lack of Transportation (Medical): Yes    Lack of Transportation (Non-Medical): Yes  Physical Activity: Sufficiently Active (11/30/2022)   Received from [facility]   Exercise Vital Sign    On average, how many days per week do you engage in moderate to strenuous exercise (like a brisk walk)?: 5 days    On average, how many minutes do you engage in exercise at this level?: 30 min  Stress: No Stress Concern Present (11/30/2022)   Received from [facility] Occupational [facility] - Occupational Stress Questionnaire    Feeling of Stress : Not at all  Social Connections: Socially Isolated (07/17/2023)   Social Connection and Isolation Panel    Frequency of Communication with Friends and Family: Once a week    Frequency of Social Gatherings with Friends and Family: Once a week    Attends Religious Services: 1 to 4 times per year    Active Member of [employer] or Organizations: No     Attends Banker Meetings: Never    Marital Status: Divorced  Catering manager Violence: Not At Risk (07/17/2023)   Humiliation, Afraid, Rape, and Kick questionnaire    Fear of Current or Ex-Partner: No    Emotionally Abused: No    Physically Abused: No    Sexually Abused: No    Outpatient Medications Prior to Visit  Medication Sig Dispense Refill   amLODipine  (NORVASC ) 5 MG tablet Take 1 tablet (5 mg  total) by mouth daily. 90 tablet 3   baclofen  (LIORESAL ) 10 MG tablet Take 1 tablet (10 mg total) by mouth daily as needed for muscle spasms (laryngospasm).     HYDROcodone -acetaminophen  (NORCO/VICODIN) 5-325 MG tablet Take 1 tablet by mouth every 12 (twelve) hours as needed for moderate pain (pain score 4-6) or severe pain (pain score 7-10). 60 tablet 0   lidocaine  (XYLOCAINE ) 2 % jelly Apply 1 Application topically as needed. 30 mL 3   lovastatin  (MEVACOR ) 20 MG tablet Take 1 tablet (20 mg total) by mouth in the morning. 90 tablet 1   olmesartan  (BENICAR ) 20 MG tablet Take 1 tablet (20 mg total) by mouth daily. 90 tablet 3   omeprazole  (PRILOSEC) 20 MG capsule Take 1 capsule (20 mg total) by mouth daily. 30 capsule 3   Propylene Glycol (SYSTANE COMPLETE OP) Place 1 drop into both eyes in the morning, at noon, and at bedtime.     venlafaxine  XR (EFFEXOR -XR) 150 MG 24 hr capsule Take 2 capsules (300 mg total) by mouth every evening. (Patient taking differently: Take 150 mg by mouth daily.) 60 capsule 0   amoxicillin -clavulanate (AUGMENTIN ) 875-125 MG tablet Take 1 tablet by mouth 2 (two) times daily. 14 tablet 0   No facility-administered medications prior to visit.    Allergies  Allergen Reactions   Gabapentin  Other (See Comments)    dizziness   Tetracyclines & Related Other (See Comments)    Makes her sick on her stomach     Review of Systems  Constitutional:  Positive for fatigue. Negative for chills and fever.  HENT:  Positive for congestion, ear pain (Left-sided),  postnasal drip, sinus pain and sore throat.   Eyes:  Negative for pain and discharge.  Respiratory:  Positive for cough. Negative for shortness of breath.   Cardiovascular:  Negative for chest pain and palpitations.  Gastrointestinal:  Negative for abdominal pain, diarrhea, nausea and vomiting.  Endocrine: Negative for polydipsia and polyuria.  Genitourinary:  Negative for dysuria and hematuria.  Musculoskeletal:  Positive for arthralgias and back pain. Negative for neck pain and neck stiffness.  Skin:  Negative for rash.  Neurological:  Positive for dizziness. Negative for weakness.  Psychiatric/Behavioral:  Negative for agitation and behavioral problems.        Objective:    Physical Exam Vitals reviewed.  Constitutional:      General: She is not in acute distress.    Appearance: She is not diaphoretic.  HENT:     Head: Normocephalic and atraumatic.     Right Ear: Ear canal and external ear normal.     Left Ear: Ear canal and external ear normal.     Nose: Congestion present.     Right Sinus: Frontal sinus tenderness present.     Left Sinus: Frontal sinus tenderness present.     Mouth/Throat:     Mouth: Mucous membranes are dry.  Eyes:     General: No scleral icterus.    Extraocular Movements: Extraocular movements intact.  Cardiovascular:     Rate and Rhythm: Normal rate and regular rhythm.     Heart sounds: Normal heart sounds. No murmur heard. Pulmonary:     Breath sounds: Normal breath sounds. No wheezing or rales.  Musculoskeletal:     Right shoulder: No swelling. Decreased range of motion.     Cervical back: Neck supple. No tenderness.     Lumbar back: Tenderness present. Decreased range of motion.     Right lower leg: No  edema.     Left lower leg: No edema.  Skin:    General: Skin is warm.     Findings: No rash.  Neurological:     General: No focal deficit present.     Mental Status: She is alert and oriented to person, place, and time.     Sensory: Sensory  deficit (B/l LE) present.     Motor: Weakness (B/l LE - 4/5) present.  Psychiatric:        Mood and Affect: Mood normal.        Behavior: Behavior normal.     BP 132/83   Pulse (!) 109   Ht 5' 6 (1.676 m)   Wt 137 lb 9.6 oz (62.4 kg)   LMP 06/20/1997 (Approximate) Comment: post menopausal  SpO2 98%   BMI 22.21 kg/m  Wt Readings from Last 3 Encounters:  11/17/23 137 lb 9.6 oz (62.4 kg)  10/23/23 146 lb (66.2 kg)  10/03/23 142 lb (64.4 kg)        Assessment & Plan:   Problem List Items Addressed This Visit       Respiratory   Acute recurrent frontal sinusitis - Primary   Started empiric azithromycin  as she has persistent symptoms with despite symptomatic treatment Continue Flonase  for nasal congestion/allergies Advised to avoid excessive blowing of the nose, which can worsen ear pain      Relevant Medications   azithromycin  (ZITHROMAX ) 250 MG tablet   Other Relevant Orders   Veritor Flu A/B Waived     Other   Dizziness   Current dizziness with nausea could be due to vertigo from recent URTI Chronic dizziness likely multifactorial, due to orthostatic hypotension from dehydration, chronic sinusitis, alcohol abuse and peripheral neuropathy Meclizine  as needed for dizziness Needs to use Flonase  regularly Needs to avoid alcohol Advised to maintain at least 64 ounces of fluid intake in a day      Relevant Medications   meclizine  (ANTIVERT ) 25 MG tablet     Meds ordered this encounter  Medications   azithromycin  (ZITHROMAX ) 250 MG tablet    Sig: Take 2 tablets on day 1, then 1 tablet daily on days 2 through 5    Dispense:  6 tablet    Refill:  0   meclizine  (ANTIVERT ) 25 MG tablet    Sig: Take 1 tablet (25 mg total) by mouth 2 (two) times daily as needed for dizziness or nausea.    Dispense:  30 tablet    Refill:  0     Benno Brensinger MARLA Blanch, MD

## 2023-11-20 ENCOUNTER — Other Ambulatory Visit: Payer: Self-pay | Admitting: Internal Medicine

## 2023-11-20 DIAGNOSIS — M48062 Spinal stenosis, lumbar region with neurogenic claudication: Secondary | ICD-10-CM

## 2023-11-20 DIAGNOSIS — G894 Chronic pain syndrome: Secondary | ICD-10-CM

## 2023-11-20 LAB — VERITOR FLU A/B WAIVED
Influenza A: NEGATIVE
Influenza B: NEGATIVE

## 2023-11-20 MED ORDER — HYDROCODONE-ACETAMINOPHEN 5-325 MG PO TABS
1.0000 | ORAL_TABLET | Freq: Two times a day (BID) | ORAL | 0 refills | Status: DC | PRN
Start: 2023-11-20 — End: 2023-12-20

## 2023-11-20 NOTE — Telephone Encounter (Signed)
 Copied from CRM #[id number]. Topic: Clinical - Medication Refill >> Nov 20, 2023 11:02 AM Precious C wrote: Medication: HYDROcodone -acetaminophen  (NORCO/VICODIN) 5-325 MG tablet   Has the patient contacted their pharmacy? No, pt has to call in every month (Agent: If no, request that the patient contact the pharmacy for the refill. If patient does not wish to contact the pharmacy document the reason why and proceed with request.) (Agent: If yes, when and what did the pharmacy advise?)  This is the patient's preferred pharmacy:  [facility] - [place], [place] - [address] [place] [address] Phone: [phone] Fax: [phone]  Is this the correct pharmacy for this prescription? Yes If no, delete pharmacy and type the correct one.   Has the prescription been filled recently? No  Is the patient out of the medication? Yes  Has the patient been seen for an appointment in the last year OR does the patient have an upcoming appointment? Yes  Can we respond through MyChart? No  Agent: Please be advised that Rx refills may take up to 3 business days. We ask that you follow-up with your pharmacy.

## 2023-11-22 ENCOUNTER — Ambulatory Visit: Payer: Self-pay

## 2023-11-22 NOTE — Telephone Encounter (Signed)
 Copied from CRM [id number]. Topic: Clinical - Medication Question >> Nov 22, 2023 12:31 PM [name] wrote: Reason for CRM: Patient stated the antibiotic that Dr.Patel prescribe her for her ear is not working, her ears are still clogged. Callback number [phone]   Nurse attempted to reach pt: called & no answer: left voicemail

## 2023-11-22 NOTE — Telephone Encounter (Signed)
 FYI Only or Action Required?: Action required by provider: clinical question for provider.  Patient was last seen in primary care on 11/17/2023 by Laura Suzzane POUR, MD.    Copied from CRM [id number]. Topic: Clinical - Medication Question >> Nov 22, 2023 12:31 PM Laura Mcgee wrote: Reason for CRM: Patient stated the antibiotic that Dr.Patel prescribe her for her ear is not working, her ears are still clogged. Callback number [phone]     Nurse attempted to reach pt x3: called & no answer: left voicemail.  Routing information to PCP office

## 2023-11-22 NOTE — Telephone Encounter (Signed)
 Copied from CRM [id number]. Topic: Clinical - Medication Question >> Nov 22, 2023 12:31 PM [name] wrote: Reason for CRM: Patient stated the antibiotic that Dr.Patel prescribe her for her ear is not working, her ears are still clogged. Callback number [phone]     Nurse attempted to reach pt x2: called & no answer: left voicemail

## 2023-11-23 ENCOUNTER — Other Ambulatory Visit: Payer: Self-pay | Admitting: Internal Medicine

## 2023-11-23 ENCOUNTER — Telehealth: Payer: Self-pay

## 2023-11-23 DIAGNOSIS — J321 Chronic frontal sinusitis: Secondary | ICD-10-CM

## 2023-11-23 MED ORDER — AMOXICILLIN-POT CLAVULANATE 875-125 MG PO TABS: 1.0000 | ORAL_TABLET | Freq: Two times a day (BID) | ORAL | 0 refills | Status: DC

## 2023-11-23 MED ORDER — METHYLPREDNISOLONE 4 MG PO TBPK: ORAL_TABLET | ORAL | 0 refills | Status: DC

## 2023-11-23 NOTE — Telephone Encounter (Signed)
 Please refer to other TE. Has been addressed.

## 2023-11-23 NOTE — Telephone Encounter (Signed)
 Left detailed vm for pt.

## 2023-11-23 NOTE — Telephone Encounter (Signed)
 Copied from CRM [id number]. Topic: Clinical - Medication Question >> Nov 22, 2023 12:31 PM [name] wrote: Reason for CRM: Patient stated the antibiotic that Dr.Patel prescribe her for her ear is not working, her ears are still clogged. Callback number [phone] >> Nov 23, 2023 12:00 PM Selinda RAMAN wrote: The patient called in stating she missed a call from a nurse or her provider pertaining to her antibiotic not working and her ear still being stopped up. Please assist patient further.

## 2023-11-27 ENCOUNTER — Ambulatory Visit: Admitting: Psychiatry

## 2023-12-01 ENCOUNTER — Telehealth: Payer: Self-pay

## 2023-12-01 ENCOUNTER — Other Ambulatory Visit: Payer: Self-pay | Admitting: Internal Medicine

## 2023-12-01 DIAGNOSIS — J321 Chronic frontal sinusitis: Secondary | ICD-10-CM

## 2023-12-01 NOTE — Telephone Encounter (Signed)
 Patient advised.

## 2023-12-01 NOTE — Telephone Encounter (Signed)
 Copied from CRM [id number]. Topic: Clinical - Medication Question >> Dec 01, 2023 12:29 PM Santiya F wrote: Reason for CRM: Patient is calling in because she has taken all the medication he prescribed her and her ear is still stopped up. Patient is requesting amoxicillin  500 MG because she's been on it before and it works extremely well for her.

## 2023-12-07 ENCOUNTER — Ambulatory Visit (INDEPENDENT_AMBULATORY_CARE_PROVIDER_SITE_OTHER): Payer: Self-pay | Admitting: Gastroenterology

## 2023-12-07 DIAGNOSIS — Z5321 Procedure and treatment not carried out due to patient leaving prior to being seen by health care provider: Secondary | ICD-10-CM

## 2023-12-07 NOTE — Progress Notes (Deleted)
 GI Office Note    Referring Provider: Tobie Suzzane POUR, MD Primary Care Physician:  Tobie Suzzane POUR, MD Primary Gastroenterologist: *** Date:  12/07/2023  ID:  Laura Mcgee, DOB 02-Apr-1950, MRN [id number]   Chief Complaint   No chief complaint on file.  History of Present Illness  Laura Mcgee is a 73 y.o. female with a history of *** presenting today with complaint of     Last office visit ***.    Today:  Discussed the use of AI scribe software for clinical note transcription with the patient, who gave verbal consent to proceed.  Wt Readings from Last 5 Encounters:  11/17/23 137 lb 9.6 oz (62.4 kg)  10/23/23 146 lb (66.2 kg)  10/03/23 142 lb (64.4 kg)  09/29/23 140 lb (63.5 kg)  09/12/23 146 lb 6.4 oz (66.4 kg)    Current Outpatient Medications  Medication Sig Dispense Refill   amLODipine  (NORVASC ) 5 MG tablet Take 1 tablet (5 mg total) by mouth daily. 90 tablet 3   amoxicillin -clavulanate (AUGMENTIN ) 875-125 MG tablet Take 1 tablet by mouth 2 (two) times daily. 14 tablet 0   baclofen  (LIORESAL ) 10 MG tablet Take 1 tablet (10 mg total) by mouth daily as needed for muscle spasms (laryngospasm).     HYDROcodone -acetaminophen  (NORCO/VICODIN) 5-325 MG tablet Take 1 tablet by mouth every 12 (twelve) hours as needed for moderate pain (pain score 4-6) or severe pain (pain score 7-10). 60 tablet 0   lidocaine  (XYLOCAINE ) 2 % jelly Apply 1 Application topically as needed. 30 mL 3   lovastatin  (MEVACOR ) 20 MG tablet Take 1 tablet (20 mg total) by mouth in the morning. 90 tablet 1   meclizine  (ANTIVERT ) 25 MG tablet Take 1 tablet (25 mg total) by mouth 2 (two) times daily as needed for dizziness or nausea. 30 tablet 0   methylPREDNISolone  (MEDROL  DOSEPAK) 4 MG TBPK tablet Take as package instructions. 1 each 0   olmesartan  (BENICAR ) 20 MG tablet Take 1 tablet (20 mg total) by mouth daily. 90 tablet 3   omeprazole  (PRILOSEC) 20 MG capsule Take 1 capsule (20 mg total) by mouth  daily. 30 capsule 3   Propylene Glycol (SYSTANE COMPLETE OP) Place 1 drop into both eyes in the morning, at noon, and at bedtime.     venlafaxine  XR (EFFEXOR -XR) 150 MG 24 hr capsule Take 2 capsules (300 mg total) by mouth every evening. (Patient taking differently: Take 150 mg by mouth daily.) 60 capsule 0   No current facility-administered medications for this visit.    Past Medical History:  Diagnosis Date   Arthritis    Atrial fibrillation (HCC)    Cancer (HCC)    Vaginal   COPD (chronic obstructive pulmonary disease) (HCC)    Depression    Difficult intubation    History of laryngospasm   Dyspnea    Dysrhythmia    Hyperlipidemia    Hypertension    Numbness and tingling    Pneumonia    Pulmonary embolism (HCC) 1973    Past Surgical History:  Procedure Laterality Date   CERVIX LESION DESTRUCTION     throat   COLONOSCOPY WITH PROPOFOL  N/A 04/07/2015   SLF: 1. seven colorectal polyps removed 2. the left colon is redundant 3. small internal hemorrhoids (1 simple adenoma, 6 hyperplastic) . Repeat in 5 years.   CYST EXCISION Left    Arm   DILATATION & CURRETTAGE/HYSTEROSCOPY WITH RESECTOCOPE N/A 10/03/2023   Procedure: HYSTEROSCOPY, DILATION AND CURETTAGE WITH MYOSURE;  Surgeon:  Eldonna Mays, MD;  Location: [facility];  Service: Gynecology;  Laterality: N/A;   ESOPHAGOGASTRODUODENOSCOPY (EGD) WITH PROPOFOL  N/A 04/07/2015   SLF: 1. Schatzki ring 2. Bravo capsule 34 cm from the teeth 3. moderate non-erosive gastritis.    HARDWARE REMOVAL Right 12/05/2022   Procedure: RIGHT HUMERUS HARDWARE REMOVAL;  Surgeon: Onesimo Oneil LABOR, MD;  Location: [facility];  Service: Orthopedics;  Laterality: Right;  NEEDS RNFA   HUMERUS IM NAIL Right 06/25/2021   Procedure: INTRAMEDULLARY (IM) NAIL HUMERAL;  Surgeon: Onesimo Oneil LABOR, MD;  Location: [facility];  Service: Orthopedics;  Laterality: Right;   MASS EXCISION N/A 2002   [facility]   MASS EXCISION Left 01/21/2015   Procedure: EXCISION CYST LEFT UPPER ARM;   Surgeon: Oneil Budge, MD;  Location: [facility];  Service: General;  Laterality: Left;   MICROLARYNGOSCOPY N/A 05/31/2016   Procedure: MICRO LARYNGOSCOPY WITH BIOPSY OF VOCAL CORD LESION;  Surgeon: Daniel Moccasin, MD;  Location: [facility];  Service: ENT;  Laterality: N/A;   POLYPECTOMY N/A 04/07/2015   Procedure: POLYPECTOMY;  Surgeon: Margo LITTIE Haddock, MD;  Location: [facility];  Service: Endoscopy;  Laterality: N/A;  Descending colon polyps x 3    SAVORY DILATION N/A 04/07/2015   Procedure: SAVORY DILATION;  Surgeon: Margo LITTIE Haddock, MD;  Location: [facility];  Service: Endoscopy;  Laterality: N/A;   VULVECTOMY N/A 07/12/2022   Procedure: SIMPLE PARTIAL VULVECTOMY, ADVANCEMENT FLAP;  Surgeon: Eldonna Mays, MD;  Location: [facility];  Service: Gynecology;  Laterality: N/A;    Family History  Problem Relation Age of Onset   Pneumonia Mother    Aneurysm Father    Kidney cancer Sister    Lung cancer Sister    Esophageal cancer Brother    Melanoma Maternal Grandfather    Diabetes Daughter    Colon cancer Neg Hx    Breast cancer Neg Hx    Ovarian cancer Neg Hx    Endometrial cancer Neg Hx    Pancreatic cancer Neg Hx    Prostate cancer Neg Hx     Allergies as of 12/07/2023 - Review Complete 11/17/2023  Allergen Reaction Noted   Gabapentin  Other (See Comments) 05/22/2019   Tetracyclines & related Other (See Comments) 01/15/2015    Social History   Socioeconomic History   Marital status: Single    Spouse name: Not on file   Number of children: 1   Years of education: some college   Highest education level: Not on file  Occupational History   Occupation: Retired  Tobacco Use   Smoking status: Every Day    Current packs/day: 1.00    Average packs/day: 1 pack/day for 25.0 years (25.0 ttl pk-yrs)    Types: Cigarettes    Passive exposure: Current   Smokeless tobacco: Never   Tobacco comments:    1 ppd as of 06/08/23  Vaping Use   Vaping status: Never Used  Substance  and Sexual Activity   Alcohol use: Yes    Comment: Drinks two  20 oz beer per night.   Drug use: Never    Types: Marijuana    Comment: daily marijuana use, not since 03/2022   Sexual activity: Not Currently    Birth control/protection: Post-menopausal  Other Topics Concern   Not on file  Social History Narrative   Lives at home alone.   Right-handed.   Two cups sweet tea daily.   Social Drivers of Health   Financial Resource Strain: Low Risk  (11/30/2022)  Received from [facility]   Overall Financial Resource Strain (CARDIA)    Difficulty of Paying Living Expenses: Not hard at all  Food Insecurity: No Food Insecurity (07/17/2023)   Hunger Vital Sign    Worried About Running Out of Food in the Last Year: Never true    Ran Out of Food in the Last Year: Never true  Transportation Needs: Unmet Transportation Needs (07/17/2023)   PRAPARE - Administrator, Civil Service (Medical): Yes    Lack of Transportation (Non-Medical): Yes  Physical Activity: Sufficiently Active (11/30/2022)   Received from [facility]   Exercise Vital Sign    On average, how many days per week do you engage in moderate to strenuous exercise (like a brisk walk)?: 5 days    On average, how many minutes do you engage in exercise at this level?: 30 min  Stress: No Stress Concern Present (11/30/2022)   Received from [facility] - Occupational Stress Questionnaire    Feeling of Stress : Not at all  Social Connections: Socially Isolated (07/17/2023)   Social Connection and Isolation Panel    Frequency of Communication with Friends and Family: Once a week    Frequency of Social Gatherings with Friends and Family: Once a week    Attends Religious Services: 1 to 4 times per year    Active Member of [employer] or Organizations: No    Attends Banker Meetings: Never    Marital Status: Divorced     Review of Systems   Gen: Denies fever,  chills, anorexia. Denies fatigue, weakness, weight loss.  CV: Denies chest pain, palpitations, syncope, peripheral edema, and claudication. Resp: Denies dyspnea at rest, cough, wheezing, coughing up blood, and pleurisy. GI: See HPI Derm: Denies rash, itching, dry skin Psych: Denies depression, anxiety, memory loss, confusion. No homicidal or suicidal ideation.  Heme: Denies bruising, bleeding, and enlarged lymph nodes.  Physical Exam   LMP 06/20/1997 (Approximate) Comment: post menopausal  General:   Alert and oriented. No distress noted. Pleasant and cooperative.  Head:  Normocephalic and atraumatic. Eyes:  Conjuctiva clear without scleral icterus. Mouth:  Oral mucosa pink and moist. Good dentition. No lesions. Lungs:  Clear to auscultation bilaterally. No wheezes, rales, or rhonchi. No distress.  Heart:  S1, S2 present without murmurs appreciated.  Abdomen:  +BS, soft, non-tender and non-distended. No rebound or guarding. No HSM or masses noted. Rectal: *** Msk:  Symmetrical without gross deformities. Normal posture. Extremities:  Without edema. Neurologic:  Alert and  oriented x4 Psych:  Alert and cooperative. Normal mood and affect.  Assessment  Laura Mcgee is a 73 y.o. female presenting today with ***   Assessment and Plan Assessment & Plan    ***  PLAN   ***   Follow up ***    Charmaine Melia, MSN, FNP-BC, AGACNP-BC [facility]

## 2023-12-14 ENCOUNTER — Ambulatory Visit: Admitting: Internal Medicine

## 2023-12-14 ENCOUNTER — Ambulatory Visit: Payer: Self-pay | Admitting: Internal Medicine

## 2023-12-20 ENCOUNTER — Other Ambulatory Visit: Payer: Self-pay | Admitting: Internal Medicine

## 2023-12-20 DIAGNOSIS — G894 Chronic pain syndrome: Secondary | ICD-10-CM

## 2023-12-20 DIAGNOSIS — M48062 Spinal stenosis, lumbar region with neurogenic claudication: Secondary | ICD-10-CM

## 2023-12-20 NOTE — Telephone Encounter (Unsigned)
 Copied from CRM #[id number]. Topic: Clinical - Medication Refill >> Dec 20, 2023  4:41 PM Santiya F wrote: Medication: HYDROcodone -acetaminophen  (NORCO/VICODIN) 5-325 MG tablet [[id number]]  Has the patient contacted their pharmacy? Yes  (Agent: If yes, when and what did the pharmacy advise?) contact office   This is the patient's preferred pharmacy:  [facility] - [place], [place] - [address] [place] [address] Phone: [phone] Fax: [phone]  Is this the correct pharmacy for this prescription? Yes If no, delete pharmacy and type the correct one.   Has the prescription been filled recently? Yes  Is the patient out of the medication? Yes  Has the patient been seen for an appointment in the last year OR does the patient have an upcoming appointment? Yes  Can we respond through MyChart? No  Agent: Please be advised that Rx refills may take up to 3 business days. We ask that you follow-up with your pharmacy.

## 2023-12-21 ENCOUNTER — Other Ambulatory Visit: Payer: Self-pay | Admitting: Internal Medicine

## 2023-12-21 DIAGNOSIS — G894 Chronic pain syndrome: Secondary | ICD-10-CM

## 2023-12-21 DIAGNOSIS — M48062 Spinal stenosis, lumbar region with neurogenic claudication: Secondary | ICD-10-CM

## 2023-12-21 MED ORDER — HYDROCODONE-ACETAMINOPHEN 5-325 MG PO TABS: 1.0000 | ORAL_TABLET | Freq: Two times a day (BID) | ORAL | 0 refills | Status: DC | PRN

## 2023-12-21 NOTE — Telephone Encounter (Signed)
 Copied from CRM #[id number]. Topic: Clinical - Medication Refill >> Dec 21, 2023 10:06 AM Kendralyn S wrote: Medication: HYDROcodone -acetaminophen  (NORCO/VICODIN) 5-325 MG tablet  Has the patient contacted their pharmacy? Yes (Agent: If no, request that the patient contact the pharmacy for the refill. If patient does not wish to contact the pharmacy document the reason why and proceed with request.) (Agent: If yes, when and what did the pharmacy advise?)  This is the patient's preferred pharmacy:  [facility] - [place], [place] - [address] [place] [address] Phone: [phone] Fax: [phone]  Is this the correct pharmacy for this prescription? Yes If no, delete pharmacy and type the correct one.   Has the prescription been filled recently? No  Is the patient out of the medication? Yes  Has the patient been seen for an appointment in the last year OR does the patient have an upcoming appointment? Yes  Can we respond through MyChart? No  Agent: Please be advised that Rx refills may take up to 3 business days. We ask that you follow-up with your pharmacy.

## 2023-12-22 NOTE — Progress Notes (Signed)
 No show

## 2024-01-08 ENCOUNTER — Ambulatory Visit (INDEPENDENT_AMBULATORY_CARE_PROVIDER_SITE_OTHER): Payer: Self-pay | Admitting: Internal Medicine

## 2024-01-08 ENCOUNTER — Encounter: Payer: Self-pay | Admitting: Internal Medicine

## 2024-01-08 VITALS — BP 128/78 | HR 75 | Ht 66.0 in | Wt 141.4 lb

## 2024-01-08 DIAGNOSIS — Z23 Encounter for immunization: Secondary | ICD-10-CM

## 2024-01-08 DIAGNOSIS — F10280 Alcohol dependence with alcohol-induced anxiety disorder: Secondary | ICD-10-CM

## 2024-01-08 DIAGNOSIS — I480 Paroxysmal atrial fibrillation: Secondary | ICD-10-CM

## 2024-01-08 DIAGNOSIS — G894 Chronic pain syndrome: Secondary | ICD-10-CM

## 2024-01-08 DIAGNOSIS — J321 Chronic frontal sinusitis: Secondary | ICD-10-CM

## 2024-01-08 DIAGNOSIS — E559 Vitamin D deficiency, unspecified: Secondary | ICD-10-CM

## 2024-01-08 DIAGNOSIS — R739 Hyperglycemia, unspecified: Secondary | ICD-10-CM

## 2024-01-08 DIAGNOSIS — Z0001 Encounter for general adult medical examination with abnormal findings: Secondary | ICD-10-CM

## 2024-01-08 DIAGNOSIS — I10 Essential (primary) hypertension: Secondary | ICD-10-CM | POA: Diagnosis not present

## 2024-01-08 DIAGNOSIS — F3341 Major depressive disorder, recurrent, in partial remission: Secondary | ICD-10-CM

## 2024-01-08 DIAGNOSIS — E782 Mixed hyperlipidemia: Secondary | ICD-10-CM | POA: Diagnosis not present

## 2024-01-08 DIAGNOSIS — F1190 Opioid use, unspecified, uncomplicated: Secondary | ICD-10-CM

## 2024-01-08 MED ORDER — VENLAFAXINE HCL ER 150 MG PO CP24: 300.0000 mg | ORAL_CAPSULE | Freq: Every evening | ORAL | 5 refills | Status: AC

## 2024-01-08 MED ORDER — METHYLPREDNISOLONE 4 MG PO TBPK: ORAL_TABLET | ORAL | 0 refills | Status: DC

## 2024-01-08 MED ORDER — FLUTICASONE PROPIONATE 50 MCG/ACT NA SUSP: 2.0000 | Freq: Every day | NASAL | 2 refills | Status: AC

## 2024-01-08 NOTE — Patient Instructions (Addendum)
 Please take Mucinex  as needed for cough.  Please start taking Prednisone as prescribed.  Please continue to take medications as prescribed.  Please continue to follow low salt diet and ambulate as tolerated.  Please get fasting blood tests done before the next visit.

## 2024-01-08 NOTE — Progress Notes (Unsigned)
 New Patient Office Visit  Subjective:  Patient ID: Laura Mcgee, female    DOB: 1950-07-25  Age: 73 y.o. MRN: [id number]  CC:  Chief Complaint  Patient presents with   Medical Management of Chronic Issues    Follow up   Cough    Cough and nasal congestion ongoing for 1 week.     HPI Laura Mcgee is a 73 y.o. female with past medical history of HTN, paroxysmal A-fib, GERD, dysphagia, vulvar cancer, chronic pain syndrome, HLD and tobacco abuse who presents for establishing care.  Postural dizziness: She reports episodes of syncope. She reports lightheadedness with position change.  Her orthostatic vitals were positive in the last visit.  She has a history of poor p.o. intake of fluids, has only 1 meal in a day and reports taking 2 beers every day. She was recently evaluated by Cardiology, and was given Lasix  20 mg as needed for exertional dyspnea.  She had worsening of her dizziness with it and led to a fall at home about 2 weeks ago.  HTN: Her BP was WNL today.  Orthostatic vitals were positive in the last visit.  She takes amlodipine  5 mg QD and olmesartan  20 mg QD currently.  Denies headache, chest pain or palpitations currently.  She has stopped taking Lasix  now.  Paroxysmal A-fib: She was diagnosed with A-fib in 2023. She was admitted to [facility] in 06/2021 with right humeral fracture and new onset of atrial fibrillation with RVR. She was placed on amiodarone  drip during hospitalization but currently she is not on any rate controlling agents. She was also not placed on systemic anticoagulation due to active alcohol abuse. She follows up with Cardiology.  Chronic pain syndrome: She has history of lumbar spinal stenosis, cervical DDD and chronic right shoulder pain.  She has started taking Norco 5-325 mg BID since the last visit.  She has noticed mild improvement in her back pain, but requests to increase the dose of her Norco.  She used to take Norco 10/325 mg up to TID PRN  for chronic pain when she saw Dr Knowlton.  I had lengthy discussion about hesitation to increase her dose of Norco due to her current postural dizziness and recurrent falls.  Chronic sinusitis: She reports chronic nasal congestion, sinus pressure related headache and postnasal drip.  Denies any fever or chills.  Denies dyspnea or wheezing currently.  She has been using Flonase  inconsistently.  She requests ENT referral.  Peripheral neuropathy: She has been evaluated by Dr. Onita at [facility] neurology for in the past.  She has chronic numbness and weakness of bilateral LE.  She has tried gabapentin  and Lyrica , but could not tolerate them.  She requests referral to neurology again.  She takes Effexor  150 mg QD for hot flashes.   Past Medical History:  Diagnosis Date   Arthritis    Atrial fibrillation (HCC)    Cancer (HCC)    Vaginal   COPD (chronic obstructive pulmonary disease) (HCC)    Depression    Difficult intubation    History of laryngospasm   Dyspnea    Dysrhythmia    Hyperlipidemia    Hypertension    Numbness and tingling    Pneumonia    Pulmonary embolism (HCC) 1973    Past Surgical History:  Procedure Laterality Date   CERVIX LESION DESTRUCTION     throat   COLONOSCOPY WITH PROPOFOL  N/A 04/07/2015   SLF: 1. seven colorectal polyps removed 2. the left colon  is redundant 3. small internal hemorrhoids (1 simple adenoma, 6 hyperplastic) . Repeat in 5 years.   CYST EXCISION Left    Arm   DILATATION & CURRETTAGE/HYSTEROSCOPY WITH RESECTOCOPE N/A 10/03/2023   Procedure: HYSTEROSCOPY, DILATION AND CURETTAGE WITH MYOSURE;  Surgeon: Eldonna Mays, MD;  Location: [facility];  Service: Gynecology;  Laterality: N/A;   ESOPHAGOGASTRODUODENOSCOPY (EGD) WITH PROPOFOL  N/A 04/07/2015   SLF: 1. Schatzki ring 2. Bravo capsule 34 cm from the teeth 3. moderate non-erosive gastritis.    HARDWARE REMOVAL Right 12/05/2022   Procedure: RIGHT HUMERUS HARDWARE REMOVAL;  Surgeon: Onesimo Oneil LABOR, MD;   Location: [facility];  Service: Orthopedics;  Laterality: Right;  NEEDS RNFA   HUMERUS IM NAIL Right 06/25/2021   Procedure: INTRAMEDULLARY (IM) NAIL HUMERAL;  Surgeon: Onesimo Oneil LABOR, MD;  Location: [facility];  Service: Orthopedics;  Laterality: Right;   MASS EXCISION N/A 2002   Lanyx   MASS EXCISION Left 01/21/2015   Procedure: EXCISION CYST LEFT UPPER ARM;  Surgeon: Oneil Budge, MD;  Location: [facility];  Service: General;  Laterality: Left;   MICROLARYNGOSCOPY N/A 05/31/2016   Procedure: MICRO LARYNGOSCOPY WITH BIOPSY OF VOCAL CORD LESION;  Surgeon: Daniel Moccasin, MD;  Location: [facility];  Service: ENT;  Laterality: N/A;   POLYPECTOMY N/A 04/07/2015   Procedure: POLYPECTOMY;  Surgeon: Margo LITTIE Haddock, MD;  Location[facility];  Service: Endoscopy;  Laterality: N/A;  Descending colon polyps x 3    SAVORY DILATION N/A 04/07/2015   Procedure: SAVORY DILATION;  Surgeon: Margo LITTIE Haddock, MD;  Location[facility];  Service: Endoscopy;  Laterality: N/A;   VULVECTOMY N/A 07/12/2022   Procedure: SIMPLE PARTIAL VULVECTOMY, ADVANCEMENT FLAP;  Surgeon: Eldonna Mays, MD;  Location: [facility];  Service: Gynecology;  Laterality: N/A;    Family History  Problem Relation Age of Onset   Pneumonia Mother    Aneurysm Father    Kidney cancer Sister    Lung cancer Sister    Esophageal cancer Brother    Melanoma Maternal Grandfather    Diabetes Daughter    Colon cancer Neg Hx    Breast cancer Neg Hx    Ovarian cancer Neg Hx    Endometrial cancer Neg Hx    Pancreatic cancer Neg Hx    Prostate cancer Neg Hx     Social History   Socioeconomic History   Marital status: Single    Spouse name: Not on file   Number of children: 1   Years of education: some college   Highest education level: Not on file  Occupational History   Occupation: Retired  Tobacco Use   Smoking status: Every Day    Current packs/day: 1.00    Average packs/day: 1 pack/day for 25.0 years (25.0 ttl pk-yrs)     Types: Cigarettes    Passive exposure: Current   Smokeless tobacco: Never   Tobacco comments:    1 ppd as of 01/08/24  Vaping Use   Vaping status: Never Used  Substance and Sexual Activity   Alcohol use: Yes    Comment: Drinks two  20 oz beer per night.   Drug use: Never    Types: Marijuana    Comment: daily marijuana use, not since 03/2022   Sexual activity: Not Currently    Birth control/protection: Post-menopausal  Other Topics Concern   Not on file  Social History Narrative   Lives at home alone.   Right-handed.   Two cups sweet tea daily.  Social Drivers of Corporate Investment Banker Strain: Low Risk  (11/30/2022)   Received from [facility]   Overall Financial Resource Strain (CARDIA)    Difficulty of Paying Living Expenses: Not hard at all  Food Insecurity: No Food Insecurity (07/17/2023)   Hunger Vital Sign    Worried About Running Out of Food in the Last Year: Never true    Ran Out of Food in the Last Year: Never true  Transportation Needs: Unmet Transportation Needs (07/17/2023)   PRAPARE - Administrator, Civil Service (Medical): Yes    Lack of Transportation (Non-Medical): Yes  Physical Activity: Sufficiently Active (11/30/2022)   [facility]   Exercise Vital Sign    On average, how many days per week do you engage in moderate to strenuous exercise (like a brisk walk)?: 5 days    On average, how many minutes do you engage in exercise at this level?: 30 min  Stress: No Stress Concern Present (11/30/2022)   Received from [facility] - Occupational Stress Questionnaire    Feeling of Stress : Not at all  Social Connections: Socially Isolated (07/17/2023)   Social Connection and Isolation Panel    Frequency of Communication with Friends and Family: Once a week    Frequency of Social Gatherings with Friends and Family: Once a week    Attends Religious Services: 1 to 4 times per year     Active Member of [employer] or Organizations: No    Attends Banker Meetings: Never    Marital Status: Divorced  Catering Manager Violence: Not At Risk (07/17/2023)   Humiliation, Afraid, Rape, and Kick questionnaire    Fear of Current or Ex-Partner: No    Emotionally Abused: No    Physically Abused: No    Sexually Abused: No    ROS Review of Systems  Constitutional:  Positive for fatigue. Negative for chills and fever.  HENT:  Positive for congestion, postnasal drip, sinus pain and sore throat.   Eyes:  Negative for pain and discharge.  Respiratory:  Positive for cough. Negative for shortness of breath.   Cardiovascular:  Negative for chest pain and palpitations.  Gastrointestinal:  Negative for abdominal pain, diarrhea, nausea and vomiting.  Endocrine: Negative for polydipsia and polyuria.  Genitourinary:  Negative for dysuria and hematuria.  Musculoskeletal:  Positive for arthralgias and back pain. Negative for neck pain and neck stiffness.  Skin:  Negative for rash.  Neurological:  Positive for dizziness. Negative for weakness.  Psychiatric/Behavioral:  Negative for agitation and behavioral problems.     Objective:   Today's Vitals: BP 128/78 (BP Location: Right Arm)   Pulse 75   Ht 5' 6 (1.676 m)   Wt 141 lb 6.4 oz (64.1 kg)   LMP 06/20/1997 (Approximate) Comment: post menopausal  SpO2 97%   BMI 22.82 kg/m   Physical Exam Vitals reviewed.  Constitutional:      General: She is not in acute distress.    Appearance: She is not diaphoretic.  HENT:     Head: Normocephalic and atraumatic.     Nose: Congestion present.     Right Sinus: Frontal sinus tenderness present.     Left Sinus: Frontal sinus tenderness present.     Mouth/Throat:     Mouth: Mucous membranes are dry.  Eyes:     General: No scleral icterus.    Extraocular Movements: Extraocular movements intact.  Cardiovascular:  Rate and Rhythm: Normal rate and regular rhythm.     Heart sounds:  Normal heart sounds. No murmur heard. Pulmonary:     Breath sounds: Normal breath sounds. No wheezing or rales.  Musculoskeletal:     Right shoulder: No swelling. Decreased range of motion.     Cervical back: Neck supple. No tenderness.     Lumbar back: Tenderness present. Decreased range of motion.     Right lower leg: No edema.     Left lower leg: No edema.  Skin:    General: Skin is warm.     Findings: No rash.  Neurological:     General: No focal deficit present.     Mental Status: She is alert and oriented to person, place, and time.     Sensory: Sensory deficit (B/l LE) present.     Motor: Weakness (B/l LE - 4/5) present.  Psychiatric:        Mood and Affect: Mood normal.        Behavior: Behavior normal.     Assessment & Plan:   Problem List Items Addressed This Visit       Cardiovascular and Mediastinum   Essential hypertension - Primary (Chronic)   BP Readings from Last 1 Encounters:  01/08/24 128/78   Well-controlled with amlodipine  5 mg QD and olmesartan  20 mg QD Needs to improve hydration to avoid orthostatic hypotension from olmesartan  DCed Lasix  due to episode of fall, likely due to hypotension - reports BP in 90s/60s while taking Lasix  Counseled for compliance with the medications Advised DASH diet and moderate exercise/walking as tolerated      Paroxysmal A-fib (HCC) (Chronic)   Followed by cardiology Not on rate controlling agent or AC currently AC was previously avoided due to use of alcohol - risk of falls        Respiratory   Chronic frontal sinusitis     Other   Mixed hyperlipidemia   Continue lovastatin  20 mg QD      Chronic pain   Likely due to chronic right shoulder pain, lumbar spinal stenosis and cervical DDD Continue Norco at a lower dose only for severe pain - advised to avoid alcohol use while taking Norco        Outpatient Encounter Medications as of 01/08/2024  Medication Sig   amLODipine  (NORVASC ) 5 MG tablet Take 1  tablet (5 mg total) by mouth daily.   baclofen  (LIORESAL ) 10 MG tablet Take 1 tablet (10 mg total) by mouth daily as needed for muscle spasms (laryngospasm).   HYDROcodone -acetaminophen  (NORCO/VICODIN) 5-325 MG tablet Take 1 tablet by mouth every 12 (twelve) hours as needed for moderate pain (pain score 4-6) or severe pain (pain score 7-10).   lidocaine  (XYLOCAINE ) 2 % jelly Apply 1 Application topically as needed.   lovastatin  (MEVACOR ) 20 MG tablet Take 1 tablet (20 mg total) by mouth in the morning.   meclizine  (ANTIVERT ) 25 MG tablet Take 1 tablet (25 mg total) by mouth 2 (two) times daily as needed for dizziness or nausea.   methylPREDNISolone  (MEDROL  DOSEPAK) 4 MG TBPK tablet Take as package instructions.   olmesartan  (BENICAR ) 20 MG tablet Take 1 tablet (20 mg total) by mouth daily.   omeprazole  (PRILOSEC) 20 MG capsule Take 1 capsule (20 mg total) by mouth daily.   Propylene Glycol (SYSTANE COMPLETE OP) Place 1 drop into both eyes in the morning, at noon, and at bedtime.   venlafaxine  XR (EFFEXOR -XR) 150 MG 24 hr capsule Take 2 capsules (  300 mg total) by mouth every evening. (Patient taking differently: Take 150 mg by mouth daily.)   [DISCONTINUED] amoxicillin -clavulanate (AUGMENTIN ) 875-125 MG tablet Take 1 tablet by mouth 2 (two) times daily.   No facility-administered encounter medications on file as of 01/08/2024.    Follow-up: No follow-ups on file.   Suzzane MARLA Blanch, MD

## 2024-01-08 NOTE — Assessment & Plan Note (Signed)
 Continue lovastatin  20 mg QD

## 2024-01-08 NOTE — Assessment & Plan Note (Signed)
 Followed by cardiology Not on rate controlling agent or AC currently AC was previously avoided due to use of alcohol - risk of falls

## 2024-01-08 NOTE — Assessment & Plan Note (Signed)
 Likely due to chronic right shoulder pain, lumbar spinal stenosis and cervical DDD Continue Norco at a lower dose only for severe pain - advised to avoid alcohol use while taking Norco

## 2024-01-08 NOTE — Assessment & Plan Note (Signed)
 BP Readings from Last 1 Encounters:  01/08/24 128/78   Well-controlled with amlodipine  5 mg QD and olmesartan  20 mg QD Needs to improve hydration to avoid orthostatic hypotension from olmesartan  DCed Lasix  due to episode of fall, likely due to hypotension - reports BP in 90s/60s while taking Lasix  Counseled for compliance with the medications Advised DASH diet and moderate exercise/walking as tolerated

## 2024-01-09 DIAGNOSIS — F3341 Major depressive disorder, recurrent, in partial remission: Secondary | ICD-10-CM | POA: Insufficient documentation

## 2024-01-09 DIAGNOSIS — Z0001 Encounter for general adult medical examination with abnormal findings: Secondary | ICD-10-CM | POA: Insufficient documentation

## 2024-01-09 NOTE — Assessment & Plan Note (Signed)
 Takes 2 beers daily Postural dizziness could be due to dehydration from alcohol abuse Needs to cut down -> quit alcohol use due to her current medical conditions

## 2024-01-09 NOTE — Assessment & Plan Note (Addendum)
 Physical exam as documented. Fasting blood tests ordered today. Advised to get Shingrix and Tdap vaccines at local pharmacy.

## 2024-01-09 NOTE — Assessment & Plan Note (Signed)
 Overall well-controlled with Effexor  300 mg QD, although she takes it as 150 mg BID

## 2024-01-09 NOTE — Assessment & Plan Note (Addendum)
 Continue Flonase  Started Medrol  Dosepak for allergic symptoms/acute bronchitis Referred to ENT specialist - has appt in the next week

## 2024-01-13 LAB — TOXASSURE SELECT 13 (MW), URINE

## 2024-01-15 ENCOUNTER — Ambulatory Visit (INDEPENDENT_AMBULATORY_CARE_PROVIDER_SITE_OTHER): Admitting: Audiology

## 2024-01-15 ENCOUNTER — Institutional Professional Consult (permissible substitution) (INDEPENDENT_AMBULATORY_CARE_PROVIDER_SITE_OTHER): Admitting: Otolaryngology

## 2024-01-19 ENCOUNTER — Inpatient Hospital Stay (HOSPITAL_COMMUNITY): Admission: RE | Admit: 2024-01-19 | Source: Ambulatory Visit

## 2024-01-22 ENCOUNTER — Other Ambulatory Visit: Payer: Self-pay | Admitting: Internal Medicine

## 2024-01-22 ENCOUNTER — Telehealth: Payer: Self-pay

## 2024-01-22 DIAGNOSIS — G894 Chronic pain syndrome: Secondary | ICD-10-CM

## 2024-01-22 DIAGNOSIS — M48062 Spinal stenosis, lumbar region with neurogenic claudication: Secondary | ICD-10-CM

## 2024-01-22 MED ORDER — HYDROCODONE-ACETAMINOPHEN 5-325 MG PO TABS
1.0000 | ORAL_TABLET | Freq: Two times a day (BID) | ORAL | 0 refills | Status: AC | PRN
Start: 2024-01-22 — End: ?

## 2024-01-22 NOTE — Telephone Encounter (Signed)
 Copied from CRM #[id number]. Topic: Clinical - Medication Refill >> Jan 22, 2024 10:58 AM Drema MATSU wrote: Medication: HYDROcodone -acetaminophen  (NORCO/VICODIN) 5-325 MG tablet  Has the patient contacted their pharmacy? no (Agent: If no, request that the patient contact the pharmacy for the refill. If patient does not wish to contact the pharmacy document the reason why and proceed with request.) (Agent: If yes, when and what did the pharmacy advise?)  This is the patient's preferred pharmacy:  [facility] - [place], [place] - [address] [place] [address] Phone: [phone] Fax: [phone]  Is this the correct pharmacy for this prescription? Yes If no, delete pharmacy and type the correct one.   Has the prescription been filled recently? Yes  Is the patient out of the medication? Yes  Has the patient been seen for an appointment in the last year OR does the patient have an upcoming appointment? Yes  Can we respond through MyChart? no  Agent: Please be advised that Rx refills may take up to 3 business days. We ask that you follow-up with your pharmacy.

## 2024-01-22 NOTE — Telephone Encounter (Signed)
 Copied from CRM [id number]. Topic: Clinical - Medical Advice >> Jan 22, 2024 11:00 AM Laura Mcgee wrote: Reason for CRM: Patient wants to let the provider know that she has quit drinking. She quit a week ago.

## 2024-02-05 ENCOUNTER — Ambulatory Visit: Admitting: Neurology

## 2024-02-05 ENCOUNTER — Encounter: Payer: Self-pay | Admitting: Neurology

## 2024-02-19 ENCOUNTER — Other Ambulatory Visit: Payer: Self-pay | Admitting: Internal Medicine

## 2024-02-19 DIAGNOSIS — M48062 Spinal stenosis, lumbar region with neurogenic claudication: Secondary | ICD-10-CM

## 2024-02-19 DIAGNOSIS — G894 Chronic pain syndrome: Secondary | ICD-10-CM

## 2024-02-19 MED ORDER — HYDROCODONE-ACETAMINOPHEN 5-325 MG PO TABS: 1.0000 | ORAL_TABLET | Freq: Two times a day (BID) | ORAL | 0 refills | Status: DC | PRN

## 2024-02-19 NOTE — Telephone Encounter (Signed)
 Copied from CRM [id number]. Topic: Clinical - Medication Refill >> Feb 19, 2024 11:14 AM Wess RAMAN wrote: Medication: HYDROcodone -acetaminophen  (NORCO/VICODIN) 5-325 MG tablet   Has the patient contacted their pharmacy? No (Agent: If no, request that the patient contact the pharmacy for the refill. If patient does not wish to contact the pharmacy document the reason why and proceed with request.) (Agent: If yes, when and what did the pharmacy advise?)  This is the patient's preferred pharmacy:  [facility] - [place], [place] - [address] [place] [address] Phone: [phone] Fax: [phone]   Is this the correct pharmacy for this prescription? Yes If no, delete pharmacy and type the correct one.   Has the prescription been filled recently? Yes  Is the patient out of the medication? Yes  Has the patient been seen for an appointment in the last year OR does the patient have an upcoming appointment? Yes  Can we respond through MyChart? No  Agent: Please be advised that Rx refills may take up to 3 business days. We ask that you follow-up with your pharmacy.

## 2024-02-22 ENCOUNTER — Telehealth: Payer: Self-pay | Admitting: *Deleted

## 2024-02-22 NOTE — Telephone Encounter (Signed)
 LMOM for the patient to call the office back. Patient's appt needs to be moved from 2/16 with Dr Eldonna. Patient can be scheduled with Dr eldonna on 3/2 or Dr Rogelio on 2/11.

## 2024-03-21 ENCOUNTER — Other Ambulatory Visit: Payer: Self-pay | Admitting: Internal Medicine

## 2024-03-21 ENCOUNTER — Telehealth: Payer: Self-pay | Admitting: Internal Medicine

## 2024-03-21 DIAGNOSIS — M48062 Spinal stenosis, lumbar region with neurogenic claudication: Secondary | ICD-10-CM

## 2024-03-21 DIAGNOSIS — G894 Chronic pain syndrome: Secondary | ICD-10-CM

## 2024-03-21 MED ORDER — HYDROCODONE-ACETAMINOPHEN 5-325 MG PO TABS: 1.0000 | ORAL_TABLET | Freq: Two times a day (BID) | ORAL | 0 refills | Status: AC | PRN

## 2024-03-21 NOTE — Telephone Encounter (Signed)
 Copied from CRM #[id number]. Topic: Clinical - Medication Refill >> Mar 21, 2024 11:14 AM Joesph NOVAK wrote: Medication: HYDROcodone -acetaminophen  (NORCO/VICODIN) 5-325 MG tablet   Has the patient contacted their pharmacy? Yes (Agent: If no, request that the patient contact the pharmacy for the refill. If patient does not wish to contact the pharmacy document the reason why and proceed with request.) (Agent: If yes, when and what did the pharmacy advise?)  This is the patient's preferred pharmacy:  [facility] - [place], [place] - [address] [place] [address] Phone: [phone] Fax: [phone]    Is this the correct pharmacy for this prescription? Yes If no, delete pharmacy and type the correct one.   Has the prescription been filled recently? Yes  Is the patient out of the medication? Yes  Has the patient been seen for an appointment in the last year OR does the patient have an upcoming appointment? Yes  Can we respond through MyChart? Yes  Agent: Please be advised that Rx refills may take up to 3 business days. We ask that you follow-up with your pharmacy. >> Mar 21, 2024  3:21 PM [name] wrote: I let her know that the refill is being worked on. Also, it could take up to 3 business days for refill to be sent. She is out of medication.

## 2024-03-21 NOTE — Telephone Encounter (Signed)
 Copied from CRM #[id number]. Topic: Clinical - Medication Refill >> Mar 21, 2024 11:14 AM Joesph NOVAK wrote: Medication: HYDROcodone -acetaminophen  (NORCO/VICODIN) 5-325 MG tablet   Has the patient contacted their pharmacy? Yes (Agent: If no, request that the patient contact the pharmacy for the refill. If patient does not wish to contact the pharmacy document the reason why and proceed with request.) (Agent: If yes, when and what did the pharmacy advise?)  This is the patient's preferred pharmacy:  [facility] - [place], [place] - [address] [place] [address] Phone: [phone] Fax: [phone]    Is this the correct pharmacy for this prescription? Yes If no, delete pharmacy and type the correct one.   Has the prescription been filled recently? Yes  Is the patient out of the medication? Yes  Has the patient been seen for an appointment in the last year OR does the patient have an upcoming appointment? Yes  Can we respond through MyChart? Yes  Agent: Please be advised that Rx refills may take up to 3 business days. We ask that you follow-up with your pharmacy.

## 2024-03-21 NOTE — Telephone Encounter (Signed)
 Refill request sent to PCP.

## 2024-03-25 ENCOUNTER — Ambulatory Visit

## 2024-03-25 VITALS — Ht 66.0 in | Wt 140.0 lb

## 2024-03-25 DIAGNOSIS — Z0000 Encounter for general adult medical examination without abnormal findings: Secondary | ICD-10-CM

## 2024-03-25 DIAGNOSIS — Z780 Asymptomatic menopausal state: Secondary | ICD-10-CM

## 2024-03-25 DIAGNOSIS — Z1231 Encounter for screening mammogram for malignant neoplasm of breast: Secondary | ICD-10-CM

## 2024-03-25 NOTE — Patient Instructions (Signed)
 [name],  Thank you for taking the time for your Medicare Wellness Visit. I appreciate your continued commitment to your health goals. Please review the care plan we discussed, and feel free to reach out if I can assist you further.  Please note that Annual Wellness Visits do not include a physical exam. Some assessments may be limited, especially if the visit was conducted virtually. If needed, we may recommend an in-person follow-up with your provider.  Ongoing Care Seeing your primary care provider every 3 to 6 months helps us  monitor your health and provide consistent, personalized care.   Referrals If a referral was made during today's visit and you haven't received any updates within two weeks, please contact the referred provider directly to check on the status.  Mammogram/Bone Density Screening: Call [facility] @ Phone: [phone]   Recommended Screenings:  Health Maintenance  Topic Date Due   Medicare Annual Wellness Visit  Never done   DTaP/Tdap/Td vaccine (1 - Tdap) Never done   Pneumococcal Vaccine for age over 12 (1 of 2 - PCV) Never done   Zoster (Shingles) Vaccine (1 of 2) Never done   Breast Cancer Screening  Never done   Osteoporosis screening with Bone Density Scan  Never done   COVID-19 Vaccine (3 - Moderna risk series) 07/20/2019   Screening for Lung Cancer  07/15/2024   Colon Cancer Screening  04/06/2025   Flu Shot  Completed   Hepatitis C Screening  Completed   Meningitis B Vaccine  Aged Out       03/25/2024    4:03 PM  Advanced Directives  Does Patient Have a Medical Advance Directive? No  Would patient like information on creating a medical advance directive? No - Patient declined    Vision: Annual vision screenings are recommended for early detection of glaucoma, cataracts, and diabetic retinopathy. These exams can also reveal signs of chronic conditions such as diabetes and high blood pressure.  Dental: Annual dental screenings help  detect early signs of oral cancer, gum disease, and other conditions linked to overall health, including heart disease and diabetes.  Please see the attached documents for additional preventive care recommendations.

## 2024-03-25 NOTE — Progress Notes (Signed)
 "  HM Addressed: Mammogram ordered DEXA ordered Chief Complaint  Patient presents with   Medicare Wellness     Subjective:   Laura Mcgee is a 74 y.o. female who presents for a Medicare Annual Wellness Visit.  Visit info / Clinical Intake: Medicare Wellness Visit Type:: Initial Annual Wellness Visit Persons participating in visit and providing information:: patient Medicare Wellness Visit Mode:: Telephone If telephone:: video error Since this visit was completed virtually, some vitals may be partially provided or unavailable. Missing vitals are due to the limitations of the virtual format.: Documented vitals are patient reported If Telephone or Video please confirm:: I connected with patient using audio/video enable telemedicine. I verified patient identity with two identifiers, discussed telehealth limitations, and patient agreed to proceed. Patient Location:: home Provider Location:: office Interpreter Needed?: No Pre-visit prep was completed: yes AWV questionnaire completed by patient prior to visit?: no Living arrangements:: (!) lives alone Patient's Overall Health Status Rating: good Typical amount of pain: (!) a lot Does pain affect daily life?: (!) yes Are you currently prescribed opioids?: (!) yes  Dietary Habits and Nutritional Risks How many meals a day?: (!) 1 Eats fruit and vegetables daily?: (!) no Most meals are obtained by: preparing own meals In the last 2 weeks, have you had any of the following?: none Diabetic:: no  Functional Status Activities of Daily Living (to include ambulation/medication): Independent Ambulation: Independent with device- listed below Home Assistive Devices/Equipment: Cane Medication Administration: Independent Home Management (perform basic housework or laundry): Independent Manage your own finances?: yes Primary transportation is: driving Concerns about vision?: (!) yes Concerns about hearing?: no  Fall Screening Falls in the  past year?: 1 Number of falls in past year: 1 Was there an injury with Fall?: 1 Fall Risk Category Calculator: 3 Patient Fall Risk Level: High Fall Risk  Fall Risk Patient at Risk for Falls Due to: History of fall(s); Impaired balance/gait; Impaired mobility; Orthopedic patient Fall risk Follow up: Falls evaluation completed; Education provided; Falls prevention discussed  Home and Transportation Safety: All rugs have non-skid backing?: N/A, no rugs All stairs or steps have railings?: yes Grab bars in the bathtub or shower?: (!) no Have non-skid surface in bathtub or shower?: (!) no Good home lighting?: yes Regular seat belt use?: (!) no Hospital stays in the last year:: no  Cognitive Assessment Difficulty concentrating, remembering, or making decisions? : no Will 6CIT or Mini Cog be Completed: yes What year is it?: 0 points What month is it?: 0 points Give patient an address phrase to remember (5 components): [address] [place] About what time is it?: 0 points Count backwards from 20 to 1: 0 points Say the months of the year in reverse: 0 points Repeat the address phrase from earlier: 0 points 6 CIT Score: 0 points  Advance Directives (For Healthcare) Does Patient Have a Medical Advance Directive?: No Type of Advance Directive: Healthcare Power of [place]; Living will Would patient like information on creating a medical advance directive?: No - Patient declined  Reviewed/Updated  Reviewed/Updated: Reviewed All (Medical, Surgical, Family, Medications, Allergies, Care Teams, Patient Goals)    Allergies (verified) Gabapentin  and Tetracyclines & related   Current Medications (verified) Outpatient Encounter Medications as of 03/25/2024  Medication Sig   amLODipine  (NORVASC ) 5 MG tablet Take 1 tablet (5 mg total) by mouth daily.   baclofen  (LIORESAL ) 10 MG tablet Take 1 tablet (10 mg total) by mouth daily as needed for muscle spasms (laryngospasm).   fluticasone   (  FLONASE ) 50 MCG/ACT nasal spray Place 2 sprays into both nostrils daily.   HYDROcodone -acetaminophen  (NORCO/VICODIN) 5-325 MG tablet Take 1 tablet by mouth every 12 (twelve) hours as needed for moderate pain (pain score 4-6) or severe pain (pain score 7-10).   lidocaine  (XYLOCAINE ) 2 % jelly Apply 1 Application topically as needed.   lovastatin  (MEVACOR ) 20 MG tablet Take 1 tablet (20 mg total) by mouth in the morning.   meclizine  (ANTIVERT ) 25 MG tablet Take 1 tablet (25 mg total) by mouth 2 (two) times daily as needed for dizziness or nausea.   olmesartan  (BENICAR ) 20 MG tablet Take 1 tablet (20 mg total) by mouth daily.   omeprazole  (PRILOSEC) 20 MG capsule Take 1 capsule (20 mg total) by mouth daily.   Propylene Glycol (SYSTANE COMPLETE OP) Place 1 drop into both eyes in the morning, at noon, and at bedtime.   venlafaxine  XR (EFFEXOR -XR) 150 MG 24 hr capsule Take 2 capsules (300 mg total) by mouth every evening.   [DISCONTINUED] methylPREDNISolone  (MEDROL  DOSEPAK) 4 MG TBPK tablet Take as package instructions.   No facility-administered encounter medications on file as of 03/25/2024.    History: Past Medical History:  Diagnosis Date   Arthritis    Atrial fibrillation (HCC)    Cancer (HCC)    Vaginal   COPD (chronic obstructive pulmonary disease) (HCC)    Depression    Difficult intubation    History of laryngospasm   Dyspnea    Dysrhythmia    Hyperlipidemia    Hypertension    Numbness and tingling    Pneumonia    Pulmonary embolism (HCC) 1973   Past Surgical History:  Procedure Laterality Date   CERVIX LESION DESTRUCTION     throat   COLONOSCOPY WITH PROPOFOL  N/A 04/07/2015   SLF: 1. seven colorectal polyps removed 2. the left colon is redundant 3. small internal hemorrhoids (1 simple adenoma, 6 hyperplastic) . Repeat in 5 years.   CYST EXCISION Left    Arm   DILATATION & CURRETTAGE/HYSTEROSCOPY WITH RESECTOCOPE N/A 10/03/2023   Procedure: HYSTEROSCOPY, DILATION AND  CURETTAGE WITH MYOSURE;  Surgeon: Eldonna Mays, MD;  Location: [facility];  Service: Gynecology;  Laterality: N/A;   ESOPHAGOGASTRODUODENOSCOPY (EGD) WITH PROPOFOL  N/A 04/07/2015   SLF: 1. Schatzki ring 2. Bravo capsule 34 cm from the teeth 3. moderate non-erosive gastritis.    HARDWARE REMOVAL Right 12/05/2022   Procedure: RIGHT HUMERUS HARDWARE REMOVAL;  Surgeon: Onesimo Oneil LABOR, MD;  Location: [facility];  Service: Orthopedics;  Laterality: Right;  NEEDS RNFA   HUMERUS IM NAIL Right 06/25/2021   Procedure: INTRAMEDULLARY (IM) NAIL HUMERAL;  Surgeon: Onesimo Oneil LABOR, MD;  Location: [facility];  Service: Orthopedics;  Laterality: Right;   MASS EXCISION N/A 2002   [facility]   MASS EXCISION Left 01/21/2015   Procedure: EXCISION CYST LEFT UPPER ARM;  Surgeon: Oneil Budge, MD;  Location: [facility];  Service: General;  Laterality: Left;   MICROLARYNGOSCOPY N/A 05/31/2016   Procedure: MICRO LARYNGOSCOPY WITH BIOPSY OF VOCAL CORD LESION;  Surgeon: Daniel Moccasin, MD;  Location: [facility];  Service: ENT;  Laterality: N/A;   POLYPECTOMY N/A 04/07/2015   Procedure: POLYPECTOMY;  Surgeon: Margo LITTIE Haddock, MD;  Location: [facility];  Service: Endoscopy;  Laterality: N/A;  Descending colon polyps x 3    SAVORY DILATION N/A 04/07/2015   Procedure: SAVORY DILATION;  Surgeon: Margo LITTIE Haddock, MD;  Location: [facility];  Service: Endoscopy;  Laterality: N/A;   VULVECTOMY  N/A 07/12/2022   Procedure: SIMPLE PARTIAL VULVECTOMY, ADVANCEMENT FLAP;  Surgeon: Eldonna Mays, MD;  Location: [facility];  Service: Gynecology;  Laterality: N/A;   Family History  Problem Relation Age of Onset   Pneumonia Mother    Aneurysm Father    Kidney cancer Sister    Lung cancer Sister    Esophageal cancer Brother    Melanoma Maternal Grandfather    Diabetes Daughter    Colon cancer Neg Hx    Breast cancer Neg Hx    Ovarian cancer Neg Hx    Endometrial cancer Neg Hx    Pancreatic cancer Neg Hx    Prostate cancer Neg Hx     Social History   Occupational History   Occupation: Retired  Tobacco Use   Smoking status: Every Day    Current packs/day: 1.00    Average packs/day: 1 pack/day for 25.0 years (25.0 ttl pk-yrs)    Types: Cigarettes    Passive exposure: Current   Smokeless tobacco: Never   Tobacco comments:    1 ppd as of 01/08/24  Vaping Use   Vaping status: Never Used  Substance and Sexual Activity   Alcohol use: Yes    Comment: Drinks two  20 oz beer per night.   Drug use: Never    Types: Marijuana    Comment: daily marijuana use, not since 03/2022   Sexual activity: Not Currently    Birth control/protection: Post-menopausal   Tobacco Counseling Ready to quit: No Counseling given: Yes Tobacco comments: 1 ppd as of 01/08/24  SDOH Screenings   Food Insecurity: No Food Insecurity (03/25/2024)  Housing: Low Risk (03/25/2024)  Transportation Needs: No Transportation Needs (03/25/2024)  Utilities: Not At Risk (03/25/2024)  Alcohol Screen: Low Risk (05/30/2022)  Depression (PHQ2-9): Low Risk (03/25/2024)  Financial Resource Strain: Low Risk (11/30/2022)   [facility] from [facility]  Physical Activity: Inactive (03/25/2024)  Social Connections: Unknown (03/25/2024)  Stress: No Stress Concern Present (03/25/2024)  Tobacco Use: High Risk (03/25/2024)  Health Literacy: Adequate Health Literacy (03/25/2024)   See flowsheets for full screening details  Depression Screen PHQ 2 & 9 Depression Scale- Over the past 2 weeks, how often have you been bothered by any of the following problems? Little interest or pleasure in doing things: 0 Feeling down, depressed, or hopeless (PHQ Adolescent also includes...irritable): 0 PHQ-2 Total Score: 0 Trouble falling or staying asleep, or sleeping too much: 0 Feeling tired or having little energy: 3 Poor appetite or overeating (PHQ Adolescent also includes...weight loss): 0 Feeling bad about yourself - or that you are a failure or have let yourself or your family  down: 0 Trouble concentrating on things, such as reading the newspaper or watching television (PHQ Adolescent also includes...like school work): 0 Moving or speaking so slowly that other people could have noticed. Or the opposite - being so fidgety or restless that you have been moving around a lot more than usual: 0 Thoughts that you would be better off dead, or of hurting yourself in some way: 0 PHQ-9 Total Score: 3 If you checked off any problems, how difficult have these problems made it for you to do your work, take care of things at home, or get along with other people?: Not difficult at all  Depression Treatment Depression Interventions/Treatment : EYV7-0 Score <4 Follow-up Not Indicated     Goals Addressed               This Visit's Progress  I'd like to walk more (pt-stated)               Objective:    Today's Vitals   03/25/24 1555  Weight: 140 lb (63.5 kg)  Height: 5' 6 (1.676 m)   Body mass index is 22.6 kg/m.  Hearing/Vision screen Hearing Screening - Comments:: Patient denies any hearing difficulties.   Vision Screening - Comments:: Patient wears glasses. She has an appt scheduled in April for a follow up Immunizations and Health Maintenance Health Maintenance  Topic Date Due   Medicare Annual Wellness (AWV)  Never done   DTaP/Tdap/Td (1 - Tdap) Never done   Pneumococcal Vaccine: 50+ Years (1 of 2 - PCV) Never done   Zoster Vaccines- Shingrix (1 of 2) Never done   Mammogram  Never done   Bone Density Scan  Never done   COVID-19 Vaccine (3 - Moderna risk series) 07/20/2019   Lung Cancer Screening  07/15/2024   Colonoscopy  04/06/2025   Influenza Vaccine  Completed   Hepatitis C Screening  Completed   Meningococcal B Vaccine  Aged Out        Assessment/Plan:  This is a routine wellness examination for Laura Mcgee.  Patient Care Team: Tobie Suzzane POUR, MD as PCP - General (Internal Medicine) Cindie Carlin POUR, DO as Consulting Physician  (Internal Medicine) Stacia Diannah SQUIBB, MD as Consulting Physician (Cardiology) Darroll Anes, DO [facility]  I have personally reviewed and noted the following in the patients chart:   Medical and social history Use of alcohol, tobacco or illicit drugs  Current medications and supplements including opioid prescriptions. Functional ability and status Nutritional status Physical activity Advanced directives List of other physicians Hospitalizations, surgeries, and ER visits in previous 12 months Vitals Screenings to include cognitive, depression, and falls Referrals and appointments  Orders Placed This Encounter  Procedures   MM 3D SCREENING MAMMOGRAM BILATERAL BREAST    Standing Status:   Future    Expiration Date:   03/25/2025    Reason for Exam (SYMPTOM  OR DIAGNOSIS REQUIRED):   breast cancer screening    Preferred imaging location?:   [facility]   DG Bone Density    Standing Status:   Future    Expiration Date:   03/25/2025    Reason for Exam (SYMPTOM  OR DIAGNOSIS REQUIRED):   post menopausal estrogen deficient    Preferred imaging location?:   [facility]   In addition, I have reviewed and discussed with patient certain preventive protocols, quality metrics, and best practice recommendations. A written personalized care plan for preventive services as well as general preventive health recommendations were provided to patient.   Laura Mcgee, CMA   03/25/2024   Return March 26, 2025 at 1:50 pm, for In office Medicare Well Visit w  Wellness Nurse.  After Visit Summary: (MyChart) Due to this being a telephonic visit, the after visit summary with patients personalized plan was offered to patient via MyChart    "

## 2024-03-28 NOTE — Progress Notes (Signed)
 Laura Mcgee                                          MRN: [id number]   03/28/2024   The [facility] Quality Team Specialist reviewed this patient medical record for the purposes of chart review for care gap closure. The following were reviewed: chart review for care gap closure-breast cancer screening.    [facility] Quality Team

## 2024-04-03 ENCOUNTER — Institutional Professional Consult (permissible substitution) (INDEPENDENT_AMBULATORY_CARE_PROVIDER_SITE_OTHER): Admitting: Otolaryngology

## 2024-04-15 ENCOUNTER — Ambulatory Visit: Admitting: Gastroenterology

## 2024-04-22 ENCOUNTER — Ambulatory Visit: Admitting: Psychiatry

## 2024-05-02 ENCOUNTER — Institutional Professional Consult (permissible substitution) (INDEPENDENT_AMBULATORY_CARE_PROVIDER_SITE_OTHER): Admitting: Otolaryngology

## 2024-05-06 ENCOUNTER — Inpatient Hospital Stay: Admitting: Psychiatry

## 2024-05-07 ENCOUNTER — Ambulatory Visit: Admitting: Internal Medicine

## 2025-03-26 ENCOUNTER — Ambulatory Visit: Payer: Self-pay
